# Patient Record
Sex: Male | Born: 1940
Health system: Southern US, Community
[De-identification: ages and names within clinical notes are randomized; demographics above are authoritative.]

## PROBLEM LIST (undated history)

## (undated) DIAGNOSIS — Z789 Other specified health status: Secondary | ICD-10-CM

## (undated) DIAGNOSIS — C4490 Unspecified malignant neoplasm of skin, unspecified: Secondary | ICD-10-CM

## (undated) DIAGNOSIS — R51 Headache: Secondary | ICD-10-CM

## (undated) DIAGNOSIS — K649 Unspecified hemorrhoids: Secondary | ICD-10-CM

## (undated) DIAGNOSIS — E538 Deficiency of other specified B group vitamins: Secondary | ICD-10-CM

## (undated) DIAGNOSIS — G8929 Other chronic pain: Secondary | ICD-10-CM

## (undated) DIAGNOSIS — C61 Malignant neoplasm of prostate: Secondary | ICD-10-CM

## (undated) DIAGNOSIS — J42 Unspecified chronic bronchitis: Secondary | ICD-10-CM

## (undated) DIAGNOSIS — M545 Low back pain, unspecified: Secondary | ICD-10-CM

## (undated) DIAGNOSIS — K5790 Diverticulosis of intestine, part unspecified, without perforation or abscess without bleeding: Secondary | ICD-10-CM

## (undated) DIAGNOSIS — N281 Cyst of kidney, acquired: Secondary | ICD-10-CM

## (undated) DIAGNOSIS — I209 Angina pectoris, unspecified: Secondary | ICD-10-CM

## (undated) DIAGNOSIS — M1990 Unspecified osteoarthritis, unspecified site: Secondary | ICD-10-CM

## (undated) DIAGNOSIS — K222 Esophageal obstruction: Secondary | ICD-10-CM

## (undated) DIAGNOSIS — E785 Hyperlipidemia, unspecified: Secondary | ICD-10-CM

## (undated) DIAGNOSIS — R413 Other amnesia: Secondary | ICD-10-CM

## (undated) DIAGNOSIS — K219 Gastro-esophageal reflux disease without esophagitis: Secondary | ICD-10-CM

## (undated) DIAGNOSIS — D649 Anemia, unspecified: Secondary | ICD-10-CM

## (undated) DIAGNOSIS — R519 Headache, unspecified: Secondary | ICD-10-CM

## (undated) DIAGNOSIS — K859 Acute pancreatitis without necrosis or infection, unspecified: Secondary | ICD-10-CM

## (undated) DIAGNOSIS — R079 Chest pain, unspecified: Secondary | ICD-10-CM

## (undated) DIAGNOSIS — I447 Left bundle-branch block, unspecified: Secondary | ICD-10-CM

## (undated) DIAGNOSIS — I2510 Atherosclerotic heart disease of native coronary artery without angina pectoris: Secondary | ICD-10-CM

## (undated) DIAGNOSIS — K449 Diaphragmatic hernia without obstruction or gangrene: Secondary | ICD-10-CM

## (undated) DIAGNOSIS — I6529 Occlusion and stenosis of unspecified carotid artery: Secondary | ICD-10-CM

## (undated) DIAGNOSIS — I4891 Unspecified atrial fibrillation: Secondary | ICD-10-CM

## (undated) DIAGNOSIS — K635 Polyp of colon: Secondary | ICD-10-CM

## (undated) DIAGNOSIS — I10 Essential (primary) hypertension: Secondary | ICD-10-CM

## (undated) HISTORY — PX: PR VEIN BYPASS GRAFT,AORTO-FEM-POP: 35551

## (undated) HISTORY — DX: Diverticulosis of intestine, part unspecified, without perforation or abscess without bleeding: K57.90

## (undated) HISTORY — DX: Chest pain, unspecified: R07.9

## (undated) HISTORY — DX: Other chronic pain: G89.29

## (undated) HISTORY — DX: Polyp of colon: K63.5

## (undated) HISTORY — PX: CARDIAC CATHETERIZATION: SHX172

## (undated) HISTORY — PX: POLYPECTOMY: SHX149

## (undated) HISTORY — DX: Left bundle-branch block, unspecified: I44.7

## (undated) HISTORY — PX: COLONOSCOPY: SHX174

## (undated) HISTORY — DX: Other specified health status: Z78.9

## (undated) HISTORY — DX: Other amnesia: R41.3

## (undated) HISTORY — PX: CHOLECYSTECTOMY: SHX55

## (undated) HISTORY — DX: Occlusion and stenosis of unspecified carotid artery: I65.29

## (undated) HISTORY — DX: Diaphragmatic hernia without obstruction or gangrene: K44.9

## (undated) HISTORY — PX: PROSTATE CRYOABLATION: SUR358

## (undated) HISTORY — PX: EYE SURGERY: SHX253

## (undated) HISTORY — DX: Unspecified hemorrhoids: K64.9

## (undated) HISTORY — DX: Hyperlipidemia, unspecified: E78.5

## (undated) HISTORY — DX: Deficiency of other specified B group vitamins: E53.8

## (undated) HISTORY — PX: INGUINAL HERNIA REPAIR: SUR1180

## (undated) HISTORY — DX: Gastro-esophageal reflux disease without esophagitis: K21.9

## (undated) HISTORY — DX: Unspecified atrial fibrillation: I48.91

## (undated) HISTORY — PX: CORONARY ANGIOPLASTY WITH STENT PLACEMENT: SHX49

---

## 1998-01-09 ENCOUNTER — Observation Stay (HOSPITAL_COMMUNITY): Admission: EM | Admit: 1998-01-09 | Discharge: 1998-01-10 | Payer: Self-pay | Admitting: Emergency Medicine

## 1999-01-24 HISTORY — PX: CORONARY ARTERY BYPASS GRAFT: SHX141

## 1999-11-20 ENCOUNTER — Encounter: Payer: Self-pay | Admitting: Emergency Medicine

## 1999-11-20 ENCOUNTER — Inpatient Hospital Stay (HOSPITAL_COMMUNITY): Admission: EM | Admit: 1999-11-20 | Discharge: 1999-11-27 | Payer: Self-pay | Admitting: Emergency Medicine

## 1999-11-20 ENCOUNTER — Encounter (INDEPENDENT_AMBULATORY_CARE_PROVIDER_SITE_OTHER): Payer: Self-pay | Admitting: *Deleted

## 1999-11-22 ENCOUNTER — Encounter: Payer: Self-pay | Admitting: Thoracic Surgery (Cardiothoracic Vascular Surgery)

## 1999-11-23 ENCOUNTER — Encounter: Payer: Self-pay | Admitting: Thoracic Surgery (Cardiothoracic Vascular Surgery)

## 1999-11-24 ENCOUNTER — Encounter: Payer: Self-pay | Admitting: Thoracic Surgery (Cardiothoracic Vascular Surgery)

## 1999-11-25 ENCOUNTER — Encounter: Payer: Self-pay | Admitting: Thoracic Surgery (Cardiothoracic Vascular Surgery)

## 2000-06-04 ENCOUNTER — Encounter: Payer: Self-pay | Admitting: Internal Medicine

## 2000-06-07 ENCOUNTER — Ambulatory Visit (HOSPITAL_COMMUNITY): Admission: RE | Admit: 2000-06-07 | Discharge: 2000-06-07 | Payer: Self-pay | Admitting: Internal Medicine

## 2000-06-07 ENCOUNTER — Encounter: Payer: Self-pay | Admitting: Internal Medicine

## 2000-06-07 ENCOUNTER — Encounter (INDEPENDENT_AMBULATORY_CARE_PROVIDER_SITE_OTHER): Payer: Self-pay | Admitting: *Deleted

## 2000-06-14 ENCOUNTER — Ambulatory Visit (HOSPITAL_COMMUNITY): Admission: RE | Admit: 2000-06-14 | Discharge: 2000-06-14 | Payer: Self-pay | Admitting: Internal Medicine

## 2000-06-14 ENCOUNTER — Encounter (INDEPENDENT_AMBULATORY_CARE_PROVIDER_SITE_OTHER): Payer: Self-pay | Admitting: Specialist

## 2000-06-14 ENCOUNTER — Encounter: Payer: Self-pay | Admitting: Internal Medicine

## 2000-06-14 ENCOUNTER — Encounter (INDEPENDENT_AMBULATORY_CARE_PROVIDER_SITE_OTHER): Payer: Self-pay | Admitting: *Deleted

## 2000-06-19 ENCOUNTER — Encounter: Payer: Self-pay | Admitting: General Surgery

## 2000-06-19 ENCOUNTER — Encounter (INDEPENDENT_AMBULATORY_CARE_PROVIDER_SITE_OTHER): Payer: Self-pay | Admitting: *Deleted

## 2000-06-19 ENCOUNTER — Encounter (INDEPENDENT_AMBULATORY_CARE_PROVIDER_SITE_OTHER): Payer: Self-pay | Admitting: Specialist

## 2000-06-20 ENCOUNTER — Inpatient Hospital Stay: Admission: EM | Admit: 2000-06-20 | Discharge: 2000-06-21 | Payer: Self-pay | Admitting: Emergency Medicine

## 2000-06-20 ENCOUNTER — Encounter (INDEPENDENT_AMBULATORY_CARE_PROVIDER_SITE_OTHER): Payer: Self-pay | Admitting: *Deleted

## 2001-03-17 ENCOUNTER — Emergency Department (HOSPITAL_COMMUNITY): Admission: EM | Admit: 2001-03-17 | Discharge: 2001-03-17 | Payer: Self-pay | Admitting: Emergency Medicine

## 2001-03-17 ENCOUNTER — Encounter: Payer: Self-pay | Admitting: Emergency Medicine

## 2001-05-14 ENCOUNTER — Inpatient Hospital Stay (HOSPITAL_COMMUNITY): Admission: EM | Admit: 2001-05-14 | Discharge: 2001-05-15 | Payer: Self-pay | Admitting: Emergency Medicine

## 2001-05-14 ENCOUNTER — Encounter: Payer: Self-pay | Admitting: *Deleted

## 2001-05-15 ENCOUNTER — Encounter: Payer: Self-pay | Admitting: *Deleted

## 2001-11-08 ENCOUNTER — Emergency Department (HOSPITAL_COMMUNITY): Admission: EM | Admit: 2001-11-08 | Discharge: 2001-11-09 | Payer: Self-pay | Admitting: Emergency Medicine

## 2001-11-09 ENCOUNTER — Encounter: Payer: Self-pay | Admitting: Emergency Medicine

## 2002-01-11 ENCOUNTER — Emergency Department (HOSPITAL_COMMUNITY): Admission: EM | Admit: 2002-01-11 | Discharge: 2002-01-11 | Payer: Self-pay

## 2002-09-15 ENCOUNTER — Inpatient Hospital Stay (HOSPITAL_COMMUNITY): Admission: EM | Admit: 2002-09-15 | Discharge: 2002-09-15 | Payer: Self-pay

## 2002-09-15 ENCOUNTER — Encounter: Payer: Self-pay | Admitting: *Deleted

## 2003-06-29 ENCOUNTER — Inpatient Hospital Stay (HOSPITAL_COMMUNITY): Admission: EM | Admit: 2003-06-29 | Discharge: 2003-07-02 | Payer: Self-pay | Admitting: *Deleted

## 2003-10-21 ENCOUNTER — Inpatient Hospital Stay (HOSPITAL_COMMUNITY): Admission: EM | Admit: 2003-10-21 | Discharge: 2003-10-22 | Payer: Self-pay | Admitting: Emergency Medicine

## 2003-10-28 ENCOUNTER — Encounter: Payer: Self-pay | Admitting: Internal Medicine

## 2003-11-12 ENCOUNTER — Encounter: Payer: Self-pay | Admitting: Internal Medicine

## 2004-03-09 ENCOUNTER — Inpatient Hospital Stay (HOSPITAL_COMMUNITY): Admission: EM | Admit: 2004-03-09 | Discharge: 2004-03-10 | Payer: Self-pay | Admitting: Emergency Medicine

## 2004-03-09 ENCOUNTER — Ambulatory Visit: Payer: Self-pay | Admitting: Cardiology

## 2004-03-14 ENCOUNTER — Ambulatory Visit: Payer: Self-pay

## 2004-03-17 ENCOUNTER — Ambulatory Visit: Payer: Self-pay

## 2004-04-10 ENCOUNTER — Emergency Department (HOSPITAL_COMMUNITY): Admission: EM | Admit: 2004-04-10 | Discharge: 2004-04-10 | Payer: Self-pay | Admitting: Emergency Medicine

## 2004-05-18 ENCOUNTER — Ambulatory Visit: Payer: Self-pay | Admitting: Cardiology

## 2004-05-20 ENCOUNTER — Ambulatory Visit: Payer: Self-pay | Admitting: *Deleted

## 2004-05-21 ENCOUNTER — Inpatient Hospital Stay (HOSPITAL_COMMUNITY): Admission: EM | Admit: 2004-05-21 | Discharge: 2004-05-22 | Payer: Self-pay | Admitting: Emergency Medicine

## 2004-06-02 ENCOUNTER — Ambulatory Visit: Payer: Self-pay | Admitting: Cardiology

## 2004-10-28 ENCOUNTER — Ambulatory Visit: Payer: Self-pay | Admitting: Internal Medicine

## 2004-12-09 ENCOUNTER — Ambulatory Visit: Payer: Self-pay | Admitting: Cardiology

## 2004-12-09 ENCOUNTER — Inpatient Hospital Stay (HOSPITAL_COMMUNITY): Admission: EM | Admit: 2004-12-09 | Discharge: 2004-12-13 | Payer: Self-pay | Admitting: Emergency Medicine

## 2005-12-02 ENCOUNTER — Inpatient Hospital Stay (HOSPITAL_COMMUNITY): Admission: EM | Admit: 2005-12-02 | Discharge: 2005-12-03 | Payer: Self-pay | Admitting: Emergency Medicine

## 2005-12-02 ENCOUNTER — Ambulatory Visit: Payer: Self-pay | Admitting: Cardiology

## 2005-12-13 ENCOUNTER — Ambulatory Visit: Payer: Self-pay

## 2006-03-11 ENCOUNTER — Emergency Department (HOSPITAL_COMMUNITY): Admission: EM | Admit: 2006-03-11 | Discharge: 2006-03-12 | Payer: Self-pay | Admitting: Emergency Medicine

## 2006-03-12 ENCOUNTER — Ambulatory Visit: Payer: Self-pay | Admitting: Cardiology

## 2006-03-12 ENCOUNTER — Inpatient Hospital Stay (HOSPITAL_COMMUNITY): Admission: EM | Admit: 2006-03-12 | Discharge: 2006-03-13 | Payer: Self-pay | Admitting: Emergency Medicine

## 2006-03-13 ENCOUNTER — Ambulatory Visit: Payer: Self-pay

## 2006-03-14 ENCOUNTER — Ambulatory Visit: Payer: Self-pay

## 2006-03-20 ENCOUNTER — Ambulatory Visit: Payer: Self-pay | Admitting: Cardiology

## 2006-11-23 ENCOUNTER — Emergency Department (HOSPITAL_COMMUNITY): Admission: EM | Admit: 2006-11-23 | Discharge: 2006-11-23 | Payer: Self-pay | Admitting: Emergency Medicine

## 2007-03-14 ENCOUNTER — Ambulatory Visit: Payer: Self-pay | Admitting: Cardiology

## 2007-04-02 ENCOUNTER — Ambulatory Visit: Payer: Self-pay | Admitting: Cardiology

## 2007-04-02 ENCOUNTER — Ambulatory Visit: Payer: Self-pay

## 2007-04-02 LAB — CONVERTED CEMR LAB
ALT: 18 units/L (ref 0–53)
Albumin: 3.8 g/dL (ref 3.5–5.2)
Alkaline Phosphatase: 51 units/L (ref 39–117)
Bilirubin, Direct: 0.1 mg/dL (ref 0.0–0.3)
Calcium: 9.1 mg/dL (ref 8.4–10.5)
Chloride: 104 meq/L (ref 96–112)
GFR calc non Af Amer: 71 mL/min
Sodium: 141 meq/L (ref 135–145)
Total Bilirubin: 1.5 mg/dL — ABNORMAL HIGH (ref 0.3–1.2)
Total Protein: 6.8 g/dL (ref 6.0–8.3)
Triglycerides: 233 mg/dL (ref 0–149)
VLDL: 47 mg/dL — ABNORMAL HIGH (ref 0–40)

## 2007-12-29 ENCOUNTER — Ambulatory Visit: Payer: Self-pay | Admitting: Internal Medicine

## 2007-12-29 ENCOUNTER — Inpatient Hospital Stay (HOSPITAL_COMMUNITY): Admission: EM | Admit: 2007-12-29 | Discharge: 2008-01-01 | Payer: Self-pay | Admitting: Emergency Medicine

## 2008-01-20 ENCOUNTER — Ambulatory Visit: Payer: Self-pay | Admitting: Cardiology

## 2008-01-20 DIAGNOSIS — K219 Gastro-esophageal reflux disease without esophagitis: Secondary | ICD-10-CM

## 2008-01-20 DIAGNOSIS — I6529 Occlusion and stenosis of unspecified carotid artery: Secondary | ICD-10-CM

## 2008-01-20 DIAGNOSIS — Z951 Presence of aortocoronary bypass graft: Secondary | ICD-10-CM

## 2008-01-20 DIAGNOSIS — R0789 Other chest pain: Secondary | ICD-10-CM

## 2008-01-20 DIAGNOSIS — I447 Left bundle-branch block, unspecified: Secondary | ICD-10-CM

## 2008-01-20 DIAGNOSIS — I10 Essential (primary) hypertension: Secondary | ICD-10-CM | POA: Insufficient documentation

## 2008-03-31 ENCOUNTER — Ambulatory Visit: Payer: Self-pay

## 2008-08-10 ENCOUNTER — Encounter (INDEPENDENT_AMBULATORY_CARE_PROVIDER_SITE_OTHER): Payer: Self-pay | Admitting: *Deleted

## 2008-11-17 ENCOUNTER — Encounter: Payer: Self-pay | Admitting: Cardiology

## 2008-11-18 ENCOUNTER — Ambulatory Visit: Payer: Self-pay | Admitting: Internal Medicine

## 2008-11-18 DIAGNOSIS — Z860100 Personal history of colon polyps, unspecified: Secondary | ICD-10-CM | POA: Insufficient documentation

## 2008-11-18 DIAGNOSIS — R1310 Dysphagia, unspecified: Secondary | ICD-10-CM | POA: Insufficient documentation

## 2008-11-24 ENCOUNTER — Telehealth: Payer: Self-pay | Admitting: Internal Medicine

## 2008-11-24 ENCOUNTER — Encounter: Payer: Self-pay | Admitting: Internal Medicine

## 2008-11-24 ENCOUNTER — Ambulatory Visit: Payer: Self-pay | Admitting: Internal Medicine

## 2008-11-26 ENCOUNTER — Encounter: Payer: Self-pay | Admitting: Internal Medicine

## 2008-12-03 ENCOUNTER — Telehealth (INDEPENDENT_AMBULATORY_CARE_PROVIDER_SITE_OTHER): Payer: Self-pay | Admitting: *Deleted

## 2008-12-09 ENCOUNTER — Encounter (INDEPENDENT_AMBULATORY_CARE_PROVIDER_SITE_OTHER): Payer: Self-pay | Admitting: *Deleted

## 2008-12-22 ENCOUNTER — Ambulatory Visit: Payer: Self-pay | Admitting: Cardiology

## 2008-12-22 ENCOUNTER — Encounter: Payer: Self-pay | Admitting: Nurse Practitioner

## 2008-12-22 DIAGNOSIS — C61 Malignant neoplasm of prostate: Secondary | ICD-10-CM

## 2008-12-23 ENCOUNTER — Telehealth (INDEPENDENT_AMBULATORY_CARE_PROVIDER_SITE_OTHER): Payer: Self-pay

## 2008-12-24 ENCOUNTER — Encounter (HOSPITAL_COMMUNITY): Admission: RE | Admit: 2008-12-24 | Discharge: 2009-01-20 | Payer: Self-pay | Admitting: Cardiology

## 2008-12-24 ENCOUNTER — Telehealth: Payer: Self-pay | Admitting: Cardiology

## 2008-12-24 ENCOUNTER — Ambulatory Visit: Payer: Self-pay

## 2008-12-24 ENCOUNTER — Ambulatory Visit: Payer: Self-pay | Admitting: Cardiology

## 2008-12-24 ENCOUNTER — Encounter: Payer: Self-pay | Admitting: Cardiology

## 2008-12-31 ENCOUNTER — Telehealth (INDEPENDENT_AMBULATORY_CARE_PROVIDER_SITE_OTHER): Payer: Self-pay | Admitting: *Deleted

## 2009-01-01 ENCOUNTER — Observation Stay (HOSPITAL_COMMUNITY): Admission: EM | Admit: 2009-01-01 | Discharge: 2009-01-02 | Payer: Self-pay | Admitting: Urology

## 2009-01-01 ENCOUNTER — Other Ambulatory Visit: Payer: Self-pay | Admitting: Urology

## 2009-01-06 ENCOUNTER — Encounter (INDEPENDENT_AMBULATORY_CARE_PROVIDER_SITE_OTHER): Payer: Self-pay | Admitting: *Deleted

## 2009-02-27 ENCOUNTER — Inpatient Hospital Stay (HOSPITAL_COMMUNITY): Admission: EM | Admit: 2009-02-27 | Discharge: 2009-03-01 | Payer: Self-pay | Admitting: Emergency Medicine

## 2009-02-27 ENCOUNTER — Ambulatory Visit: Payer: Self-pay | Admitting: Internal Medicine

## 2009-05-05 ENCOUNTER — Encounter (INDEPENDENT_AMBULATORY_CARE_PROVIDER_SITE_OTHER): Payer: Self-pay | Admitting: *Deleted

## 2009-07-20 ENCOUNTER — Ambulatory Visit (HOSPITAL_COMMUNITY): Admission: RE | Admit: 2009-07-20 | Discharge: 2009-07-20 | Payer: Self-pay | Admitting: Urology

## 2009-08-29 ENCOUNTER — Emergency Department (HOSPITAL_COMMUNITY): Admission: EM | Admit: 2009-08-29 | Discharge: 2009-08-30 | Payer: Self-pay | Admitting: Emergency Medicine

## 2009-09-23 ENCOUNTER — Encounter: Payer: Self-pay | Admitting: Cardiology

## 2009-09-23 ENCOUNTER — Encounter: Payer: Self-pay | Admitting: Internal Medicine

## 2009-12-23 DIAGNOSIS — K859 Acute pancreatitis without necrosis or infection, unspecified: Secondary | ICD-10-CM

## 2009-12-23 HISTORY — DX: Acute pancreatitis without necrosis or infection, unspecified: K85.90

## 2010-01-04 ENCOUNTER — Emergency Department (HOSPITAL_COMMUNITY)
Admission: EM | Admit: 2010-01-04 | Discharge: 2010-01-04 | Payer: Self-pay | Source: Home / Self Care | Admitting: Emergency Medicine

## 2010-01-07 ENCOUNTER — Inpatient Hospital Stay (HOSPITAL_COMMUNITY)
Admission: EM | Admit: 2010-01-07 | Discharge: 2010-01-15 | Payer: Self-pay | Source: Home / Self Care | Attending: Cardiology | Admitting: Cardiology

## 2010-01-15 ENCOUNTER — Encounter: Payer: Self-pay | Admitting: Cardiovascular Disease

## 2010-01-15 ENCOUNTER — Encounter: Payer: Self-pay | Admitting: Gastroenterology

## 2010-01-18 ENCOUNTER — Encounter (INDEPENDENT_AMBULATORY_CARE_PROVIDER_SITE_OTHER): Payer: Self-pay | Admitting: *Deleted

## 2010-01-28 ENCOUNTER — Encounter: Payer: Self-pay | Admitting: Physician Assistant

## 2010-01-31 ENCOUNTER — Encounter: Payer: Self-pay | Admitting: Physician Assistant

## 2010-01-31 ENCOUNTER — Other Ambulatory Visit: Payer: Self-pay | Admitting: Physician Assistant

## 2010-01-31 ENCOUNTER — Telehealth: Payer: Self-pay | Admitting: Cardiology

## 2010-01-31 ENCOUNTER — Ambulatory Visit
Admission: RE | Admit: 2010-01-31 | Discharge: 2010-01-31 | Payer: Self-pay | Source: Home / Self Care | Attending: Cardiology | Admitting: Cardiology

## 2010-01-31 ENCOUNTER — Telehealth: Payer: Self-pay | Admitting: Internal Medicine

## 2010-01-31 DIAGNOSIS — K859 Acute pancreatitis without necrosis or infection, unspecified: Secondary | ICD-10-CM | POA: Insufficient documentation

## 2010-01-31 LAB — CBC WITH DIFFERENTIAL/PLATELET
Basophils Absolute: 0 10*3/uL (ref 0.0–0.1)
Basophils Relative: 0.6 % (ref 0.0–3.0)
Eosinophils Absolute: 0.1 10*3/uL (ref 0.0–0.7)
Eosinophils Relative: 1.7 % (ref 0.0–5.0)
HCT: 36.6 % — ABNORMAL LOW (ref 39.0–52.0)
Hemoglobin: 12.4 g/dL — ABNORMAL LOW (ref 13.0–17.0)
Lymphocytes Relative: 29.8 % (ref 12.0–46.0)
Lymphs Abs: 1.8 10*3/uL (ref 0.7–4.0)
MCHC: 33.8 g/dL (ref 30.0–36.0)
MCV: 91.8 fl (ref 78.0–100.0)
Monocytes Absolute: 0.5 10*3/uL (ref 0.1–1.0)
Monocytes Relative: 8.2 % (ref 3.0–12.0)
Neutro Abs: 3.5 10*3/uL (ref 1.4–7.7)
Neutrophils Relative %: 59.7 % (ref 43.0–77.0)
Platelets: 206 10*3/uL (ref 150.0–400.0)
RBC: 3.99 Mil/uL — ABNORMAL LOW (ref 4.22–5.81)
RDW: 13.4 % (ref 11.5–14.6)
WBC: 5.9 10*3/uL (ref 4.5–10.5)

## 2010-01-31 LAB — HEPATIC FUNCTION PANEL
ALT: 15 U/L (ref 0–53)
AST: 14 U/L (ref 0–37)
Albumin: 3.4 g/dL — ABNORMAL LOW (ref 3.5–5.2)
Alkaline Phosphatase: 53 U/L (ref 39–117)
Bilirubin, Direct: 0.2 mg/dL (ref 0.0–0.3)
Total Bilirubin: 1.2 mg/dL (ref 0.3–1.2)
Total Protein: 6.1 g/dL (ref 6.0–8.3)

## 2010-01-31 LAB — LIPASE: Lipase: 47 U/L (ref 11.0–59.0)

## 2010-02-01 ENCOUNTER — Ambulatory Visit
Admission: RE | Admit: 2010-02-01 | Discharge: 2010-02-01 | Payer: Self-pay | Source: Home / Self Care | Attending: Gastroenterology | Admitting: Gastroenterology

## 2010-02-01 ENCOUNTER — Telehealth (INDEPENDENT_AMBULATORY_CARE_PROVIDER_SITE_OTHER): Payer: Self-pay | Admitting: *Deleted

## 2010-02-01 ENCOUNTER — Telehealth: Payer: Self-pay | Admitting: Cardiology

## 2010-02-01 DIAGNOSIS — R110 Nausea: Secondary | ICD-10-CM | POA: Insufficient documentation

## 2010-02-01 DIAGNOSIS — R1013 Epigastric pain: Secondary | ICD-10-CM | POA: Insufficient documentation

## 2010-02-01 DIAGNOSIS — F411 Generalized anxiety disorder: Secondary | ICD-10-CM | POA: Insufficient documentation

## 2010-02-01 DIAGNOSIS — Z8719 Personal history of other diseases of the digestive system: Secondary | ICD-10-CM | POA: Insufficient documentation

## 2010-02-09 ENCOUNTER — Encounter: Payer: Self-pay | Admitting: Cardiology

## 2010-02-09 ENCOUNTER — Ambulatory Visit: Admission: RE | Admit: 2010-02-09 | Discharge: 2010-02-09 | Payer: Self-pay | Source: Home / Self Care

## 2010-02-22 NOTE — Letter (Signed)
Summary: [facility] Office Visit Note   [facility] Office Visit Note   Imported By: Roderic Ovens 10/12/2009 15:51:41  _____________________________________________________________________  External Attachment:    Type:   Image     Comment:   External Document

## 2010-02-22 NOTE — Letter (Signed)
Summary: [facility]  [facility]   Imported By: Lennie Odor 10/01/2009 17:14:47  _____________________________________________________________________  External Attachment:    Type:   Image     Comment:   External Document

## 2010-02-22 NOTE — Miscellaneous (Signed)
  Clinical Lists Changes PT GIVEN AMLODIPINE AT DISCHARGE FROM HOSP Deliah Goody, RN  May 05, 2009 3:02 PM  Medications: Added new medication of AMLODIPINE BESYLATE 5 MG TABS (AMLODIPINE BESYLATE) Take one tablet by mouth daily - Signed Rx of AMLODIPINE BESYLATE 5 MG TABS (AMLODIPINE BESYLATE) Take one tablet by mouth daily;  #30 x 12;  Signed;  Entered by: Deliah Goody, RN;  Authorized by: Ferman Hamming, MD, [facility];  Method used: Electronically to [facility]  [address] [phone]*, [address] [place], [place], [place]  [address], Ph: [phone] or [phone], Fax: [phone]    Prescriptions: AMLODIPINE BESYLATE 5 MG TABS (AMLODIPINE BESYLATE) Take one tablet by mouth daily  #30 x 12   Entered by:   Deliah Goody, RN   Authorized by:   Ferman Hamming, MD, [facility]   Signed by:   Deliah Goody, RN on 05/05/2009   Method used:   Electronically to        [facility]  [address] [phone]* (retail)       [address]       [place], [place]  [address]       Ph: [phone] or [phone]       Fax: [phone]   RxID:   [id number]

## 2010-02-24 NOTE — Progress Notes (Signed)
Summary: pt rtn call from yesterday  Phone Note Call from Patient Call back at Home Phone [phone]   Caller: Spouse Reason for Call: Talk to Nurse, Talk to Doctor Summary of Call: pt rtn call from yesterday from the triage nurse Initial call taken by: Omer Jack,  February 01, 2010 8:07 AM  Follow-up for Phone Call        left msg to adv pt of results of lab.  Follow-up by: Claris Gladden RN,  February 01, 2010 9:25 AM

## 2010-02-24 NOTE — Progress Notes (Signed)
Summary: GI appointment/returning your call  Phone Note Outgoing Call Call back at Home Phone [phone]   Summary of Call: Called pt to give him appt time with GI tomorrow. Pt is scheduled to see Lucrezia Europe, NP on February 01, 2010 at 10:15.  Left message to call back.   Initial call taken by: Dossie Arbour, RN, BSN,  January 31, 2010 11:46 AM  Follow-up for Phone Call        pt returning your call Follow-up by: Roe Coombs,  January 31, 2010 12:43 PM  Additional Follow-up for Phone Call Additional follow up Details #1::        Wife given appt. time. She states husband will be at the appt. Additional Follow-up by: Dossie Arbour, RN, BSN,  January 31, 2010 1:33 PM

## 2010-02-24 NOTE — Miscellaneous (Signed)
Summary: med update  Clinical Lists Changes  Medications: Changed medication from ISOSORBIDE MONONITRATE CR 30 MG XR24H-TAB (ISOSORBIDE MONONITRATE) Take one tablet by mouth daily to ISOSORBIDE MONONITRATE CR 60 MG XR24H-TAB (ISOSORBIDE MONONITRATE) Take  1/2 tablet by mout two times a day

## 2010-02-24 NOTE — Progress Notes (Signed)
Summary: Needs hosp fu appt  Phone Note From Other Clinic   Caller: Tyson Babinski [facility] [phone]  Call For: Dr Marina Goodell Reason for Call: Schedule Patient Appt Summary of Call: Patient needs hsopital follow up appt this week with Dr Marina Goodell or Mike Gip Initial call taken by: Leanor Kail [facility],  January 31, 2010 10:13 AM  Follow-up for Phone Call        Given appt.with Gunnar Fusi for tomorrow.Cardiology will contact pt. Follow-up by: Teryl Lucy RN,  January 31, 2010 11:14 AM

## 2010-02-24 NOTE — Letter (Signed)
Summary: [facility] Procedure Report  [facility] Procedure Report   Imported By: Marylou Mccoy 02/03/2010 09:19:10  _____________________________________________________________________  External Attachment:    Type:   Image     Comment:   External Document

## 2010-02-24 NOTE — Miscellaneous (Signed)
Clinical Lists Changes  Observations: Added new observation of CARDCATHFIND: 1. Coronary artery disease, status post prior coronary bypass graft     surgery. 2. Severe native vessel disease with 0% stenosis at the stent site in     the left main and proximal circumflex artery, total occlusion of     the left anterior descending artery, 90% stenosis at a bifurcation     of the AV circumflex and marginal branch, 0% stenosis at the ostium     of the stent in the right coronary artery, and 40% narrowing in the     mid-right coronary artery. 3. Patent radial graft to the diagonal branch of the LAD with     occlusion of its distal limb to the circumflex marginal vessel, and     patent LIMA graft to the LAD. 4. Normal LV function.   RECOMMENDATIONS:  I think the culprit for the patient's unstable angina are the tight lesions in the marginal and AV circumflex artery, which is a bifurcation lesion.  This is a very difficult lesion for intervention because of a sharp bend from the left main into the vessel and because of stents in this vessel making this not compliant.  I think it is feasible to do, and I think we will plan to try and attempt to open this vessel tomorrow.     (01/12/2010 14:16) Added new observation of NUCLEAR NOS:  Findings: Utilizing gated data, the end-diastolic volume is   estimated to be 65 ml and the end-systolic volume 24 ml.   Calculated ejection fraction is 64%.  There is evidence of septal   hypokinesis which may relate to prior CABG.    SPECT imaging shows no evidence of inducible ischemia.  Mildly   decreased septal and anteroseptal perfusion on both stress and rest   studies may be consistent with a component of scar.    IMPRESSION:   No evidence of myocardial ischemia.  Calculated ejection fraction   of 64%.  Septal hypokinesis and suggestion of potentially component   of the septal/anteroseptal scar.  Some of the septal wall motion   abnormality is likely  secondary to prior CABG. (01/08/2010 14:17)      Cardiac Cath  Procedure date:  01/12/2010  Findings:      1. Coronary artery disease, status post prior coronary bypass graft     surgery. 2. Severe native vessel disease with 0% stenosis at the stent site in     the left main and proximal circumflex artery, total occlusion of     the left anterior descending artery, 90% stenosis at a bifurcation     of the AV circumflex and marginal branch, 0% stenosis at the ostium     of the stent in the right coronary artery, and 40% narrowing in the     mid-right coronary artery. 3. Patent radial graft to the diagonal branch of the LAD with     occlusion of its distal limb to the circumflex marginal vessel, and     patent LIMA graft to the LAD. 4. Normal LV function.   RECOMMENDATIONS:  I think the culprit for the patient's unstable angina are the tight lesions in the marginal and AV circumflex artery, which is a bifurcation lesion.  This is a very difficult lesion for intervention because of a sharp bend from the left main into the vessel and because of stents in this vessel making this not compliant.  I think it is feasible to do,  and I think we will plan to try and attempt to open this vessel tomorrow.      Nuclear Study  Procedure date:  01/08/2010  Findings:       Findings: Utilizing gated data, the end-diastolic volume is   estimated to be 65 ml and the end-systolic volume 24 ml.   Calculated ejection fraction is 64%.  There is evidence of septal   hypokinesis which may relate to prior CABG.    SPECT imaging shows no evidence of inducible ischemia.  Mildly   decreased septal and anteroseptal perfusion on both stress and rest   studies may be consistent with a component of scar.    IMPRESSION:   No evidence of myocardial ischemia.  Calculated ejection fraction   of 64%.  Septal hypokinesis and suggestion of potentially component   of the septal/anteroseptal scar.  Some of  the septal wall motion   abnormality is likely secondary to prior CABG.

## 2010-02-24 NOTE — Assessment & Plan Note (Signed)
Summary: [name] [name]   Referring Provider:  n/a Primary Provider:  Burnell Blanks, MD   CC:  follow up [name]..pt complains of sob.Marland Kitchenalso pt complains of nausea.  History of Present Illness: Primary Cardiologist:  Dr. Olga [name]  [name] [name] is a 70 yo male with a h/o CAD, status post CABG in 2001, status post DES to the left main in the past as well as stenting to the CFX and RCA.  He was recently admitted with unstable angina from December 16 to December 24.  MI was ruled out.  Stress test was nonischemic.  But with continued chest pain, cardiac [name] was pursued.  [name] demonstrated a patent radial graft to the DX with total occlusion of the distal limb to the circumflex and a patent L-LAD.  A stent to the left main was patent as was the stent in the circumflex and RCA.  He did have high-grade bifurcational disease of the AV circumflex/obtuse marginal.  This was treated with a Promus DES to the CFX.  The patient continued to have abdominal pain postintervention and was seen by gastroenterology.  He was diagnosed with acute pancreatitis.  Etiology was uncertain.  He did have his PPI discontinued and he was placed on an H2 receptor antagonist.  ERCP was unremarkable.  He is to have followup with gastroenterology as an outpatient.  The patient continues to have significant symptoms of abdominal pain and nausea.  He was unable to go without his protonix and restarted this medication last week.  He denies dysphagia.  He has had a long history of violent shaking when he gets nauseated.  This has apparently been worked up in the past by his primary care physician.  He has chronic chest pain.  This is unchanged.  However, after further questioning, the patient does note that he had some chest discomfort and shortness of breath that was worse with exertion prior to his intervention.  This is resolved.  He denies syncope.  He denies orthopnea or PND.  He denies pedal edema.  Current Medications  (verified): 1)  Metoprolol Tartrate 50 Mg Tabs (Metoprolol Tartrate) .Marland Kitchen.. 1 1/2 Tab By Mouth Two Times A Day 2)  Enalapril Maleate 20 Mg Tabs (Enalapril Maleate) .Marland Kitchen.. 1 Tablet By Mouth Two Times A Day 3)  Benadryl 25 Mg Caps (Diphenhydramine Hcl) .... Take As Needed 4)  Aspirin 81 Mg Tbec (Aspirin) .Marland Kitchen.. 1 Tablet By Mouth Once Daily 5)  Fish Oil 1000 Mg Caps (Omega-3 Fatty Acids) .Marland Kitchen.. 1 Tablet By Mouth Two Times A Day 6)  Metoclopramide Hcl 10 Mg Tabs (Metoclopramide Hcl) .... Take 1 As Needed 7)  Protonix 40 Mg Tbec (Pantoprazole Sodium) .... Take 1 By Mouth Two Times A Day 8)  Isosorbide Mononitrate Cr 60 Mg Xr24h-Tab (Isosorbide Mononitrate) .... Take  1/2 Tablet By [employer] Two Times A Day 9)  Amlodipine Besylate 5 Mg Tabs (Amlodipine Besylate) .... 1/2 Tab By Mouth Two Times A Day 10)  Plavix 75 Mg Tabs (Clopidogrel Bisulfate) .... Take One Tablet By Mouth Daily  Allergies: 1)  ! Codeine 2)  ! Oxycodone Hcl 3)  ! Augmentin 4)  ! Morphine 5)  ! Ambien (Zolpidem Tartrate) 6)  ! * Shellfish  Past History:  Past Medical History: CAD      a. s/p cabg      b.  Low risk Myoview 3/09      c. s/p stent to LM, CFX and RCA in past      d. [name]  12/2009: radial to Dx graft ok with occl. of distal limb to CFX; L-LAD ok; stent to LM, CFX and RCA ok; 90% stenosis at bifurcation of AVCFX/OM . Marland Kitchen . treated with DES (Promus); preserved LVF Chronic chest pain LBBB Cerebrovascular Disease      a. carotid u/s 3/09: RICA- 0-39%, LICA- 40-59% G E R D Hyperlipidemia Hypertension Inguinal hernias Statin intolerance Anxiety Disorder Angina Prostate Cancer 11/16/2008 Pancreatitis 12/2009 (ERCP ok)  Review of Systems       As per  the HPI.  All other systems reviewed and negative.   Vital Signs:  Patient profile:   70 year old male Height:      64 inches Weight:      142 pounds BMI:     24.46 Pulse rate:   63 / minute Resp:     16 per minute BP sitting:   102 / 64  (left arm)  Vitals  Entered By: [name] [name] (January 31, 2010 9:24 AM)  Physical Exam  General:  Well nourished, well developed, in no acute distress HEENT: normal Neck: no JVD Cardiac:  normal S1, S2; RRR; no murmur Lungs:  clear to auscultation bilaterally, no wheezing, rhonchi or rales Abd: soft, nontender, no hepatomegaly Ext: no edema; RFA site without hematoma or bruit Vascular: + bilat carotid  bruits Skin: warm and dry Neuro:  CNs 2-12 intact, no focal abnormalities noted    EKG  Procedure date:  01/31/2010  Findings:      sinus brady HR 58 LBBB   Impression & Recommendations:  Problem # 1:  CAD, ARTERY BYPASS GRAFT (ICD-414.04) He has chronic chest pain.  His angina prior to his PCI was chest pain and dyspnea with exertion (i.e. with going to the mailbox).  This has resolved.  I have explained the importance of taking Plavix and ASA.  He is unable to take any statins due to side effects.  I believe his current chest symptoms are related to his GI problems.  He needs follow up with GI.  He will also be brought back in follow up with [name] [name] in the next 4 weeks.  Problem # 2:  PANCREATITIS (ICD-577.0) He needs follow up with GI.  He was unable to remain on zantac due to problems with his GERD.  He is now back on Protonix.  As noted, his ERCP was ok in the hospital.  We will make sure he has close f/u with GI.  Will check LFTs and Lipase today to follow up.  Orders: TLB-Hepatic/Liver Function Pnl (80076-HEPATIC) TLB-Lipase (83690-LIPASE)  Problem # 3:  HYPERCHOLESTEROLEMIA, PURE (ICD-272.0) Intol to statins.  Problem # 4:  HYPERTENSION, BENIGN (ICD-401.1) Controlled.  Orders: TLB-CBC Platelet - w/Differential (85025-CBCD)  Problem # 5:  GERD (ICD-530.81) As above, we will set up for close f/u with GI.  Problem # 6:  CAROTID ARTERY STENOSIS, WITHOUT INFARCTION (ICD-433.10) He needs f/u dopplers.  These will be arranged.  Orders: Carotid Duplex (Carotid  Duplex)  Problem # 7:  Thrombocytopenia Platelet count was low in the hospital x 1 (133K).  Will repeat today.  Patient Instructions: 1)  Your physician recommends that you schedule a follow-up appointment in: 4 weeks with [name] [name] 2)  Your physician recommends that you continue on your current medications as directed. Please refer to the Current Medication list given to you today. 3)  We have called Dr. Lamar Sprinkles office (gastroenterology) to make appt. for you to be seen this week or early next  week.  They will call you with appt time. Their office number is [phone]. 4)  Your physician has requested that you have a carotid duplex. This test is an ultrasound of the carotid arteries in your neck. It looks at blood flow through these arteries that supply the brain with blood. Allow one hour for this exam. There are no restrictions or special instructions.

## 2010-02-24 NOTE — Assessment & Plan Note (Signed)
Summary: Post hosp. ERCP f/u-done by Dr.kaplan   History of Present Illness Visit Type: Follow-up Visit Primary GI MD: Yancey Flemings MD Primary Provider: Burnell Blanks, MD  Requesting Provider: Dr. Nathanial Rancher Chief Complaint: Post ER and ERCP, Pt sick on stomach and having chest pain History of Present Illness:   Patient is known to Dr. Marina Goodell for chronic chest pain, upper abdominal pain, GERD complicated by peptic stricture, and colon polyps. He was last seen in the office at time of his last colonoscopy November 2010.   Patient was evaluated in ER 01/04/10 for RLQ pain and nausea. CBC,CMET and lipase were normal. CTscan without IV contrast was negative except for stable hepatic cysts and slightly enlarged renal cysts. Patient releaased from ER. He went back to ER a few days later with chest pain. Patient was admitted and underwent cardiac catherization with PCI to the circumflex artery. During that admission patient developed acute on chronic abdominal pain in setting of a Lipase of 360. U/S was negative, LFTs normal. He underwent ERCP which was normal.  Cause of panreatitis was not determined. His PPI was discontinued in hopes thatt it was the culprit. Patient followed up with cardiologist two days ago. Because of his complaints of ongoing abdominal pain and nausea, patient was asked to follow up with [place].   He  does have epigastric pain but mainly complains of postprandial chest pain, bloating and nausea.  After PPI was discontinued patient was started on Zantac .He feels Protonix definately worked better.  Patient's atypical chest pain and nausea have been present for years. Episodes of nausea are often associated with severe whole body shaking. His wife believes her husband suffers from panic attacks.    He complains of dizziness with standing, states this is a chronic problem as well. Diagnosed with vertigo at some point. No SOB. No syncope episodes. Marland Kitchen    GI Review of Systems     Location of   Abdominal pain: epigastric area.    Denies abdominal pain, acid reflux, belching, bloating, chest pain, dysphagia with liquids, dysphagia with solids, heartburn, loss of appetite, nausea, vomiting, vomiting blood, weight loss, and  weight gain.        Denies anal fissure, black tarry stools, change in bowel habit, constipation, diarrhea, diverticulosis, fecal incontinence, heme positive stool, hemorrhoids, irritable bowel syndrome, jaundice, light color stool, liver problems, rectal bleeding, and  rectal pain.    Current Medications (verified): 1)  Metoprolol Tartrate 50 Mg Tabs (Metoprolol Tartrate) .Marland Kitchen.. 1 1/2 Tab By Mouth Two Times A Day 2)  Enalapril Maleate 20 Mg Tabs (Enalapril Maleate) .Marland Kitchen.. 1 Tablet By Mouth Two Times A Day 3)  Benadryl 25 Mg Caps (Diphenhydramine Hcl) .... Take As Needed 4)  Aspirin 81 Mg Tbec (Aspirin) .Marland Kitchen.. 1 Tablet By Mouth Once Daily 5)  Fish Oil 1000 Mg Caps (Omega-3 Fatty Acids) .Marland Kitchen.. 1 Tablet By Mouth Two Times A Day 6)  Isosorbide Mononitrate Cr 60 Mg Xr24h-Tab (Isosorbide Mononitrate) .... Take  1/2 Tablet By [employer] Two Times A Day 7)  Amlodipine Besylate 5 Mg Tabs (Amlodipine Besylate) .... 1/2 Tab By Mouth Two Times A Day 8)  Plavix 75 Mg Tabs (Clopidogrel Bisulfate) .... Take One Tablet By Mouth Daily 9)  Carafate 1 Gm Tabs (Sucralfate) .... Take 1 Tab Before Meals and At Bedtime 10)  Ondansetron Hcl 4 Mg Tabs (Ondansetron Hcl) .... Take 1 Tab Every 6 Hours As Needed For Nausea 11)  Lorazepam 0.5 Mg Tabs (Lorazepam) .... Take 1/2 Tab  At Bedtime  Allergies: 1)  ! Codeine 2)  ! Oxycodone Hcl 3)  ! Augmentin 4)  ! Morphine 5)  ! Ambien (Zolpidem Tartrate) 6)  ! * Nitrofur 7)  ! * Tramadol 8)  ! * Shellfish  Past History:  Past Medical History: Last updated: 01/31/2010 CAD      a. s/p cabg      b.  Low risk Myoview 3/09      c. s/p stent to LM, CFX and RCA in past      d. cath 12/2009: radial to Dx graft ok with occl. of distal limb to CFX; L-LAD ok;  stent to LM, CFX and RCA ok; 90% stenosis at bifurcation of AVCFX/OM . Marland Kitchen . treated with DES (Promus); preserved LVF Chronic chest pain LBBB Cerebrovascular Disease      a. carotid u/s 3/09: RICA- 0-39%, LICA- 40-59% G E R D Hyperlipidemia Hypertension Inguinal hernias Statin intolerance Anxiety Disorder Angina Prostate Cancer 11/16/2008 Pancreatitis 12/2009 (ERCP ok)  Past Surgical History: Last updated: 01/20/2008 CABG (10-26-99; LIMA to LAD; radial artery to D1 and OM1) Previous LM and RCA stent cholecystectomy  Family History: Last updated: 11/18/2008 Mother and Four siblings with premature CAD Father died of lung cancer No FH of Colon Cancer:  Social History: Last updated: 11/18/2008 Married  Alcohol Use - no Occupation: Retired  Daily Caffeine Use: one daily  Illicit Drug Use - no Smokeless Tobacco Use  Patient has never smoked.   Review of Systems       The patient complains of fatigue, shortness of breath, sleeping problems, and sore throat.  The patient denies allergy/sinus, anemia, anxiety-new, arthritis/joint pain, back pain, blood in urine, breast changes/lumps, change in vision, confusion, cough, coughing up blood, depression-new, fainting, fever, headaches-new, hearing problems, heart murmur, heart rhythm changes, itching, muscle pains/cramps, night sweats, nosebleeds, skin rash, swelling of feet/legs, swollen lymph glands, thirst - excessive, urination - excessive, urination changes/pain, urine leakage, vision changes, and voice change.    Vital Signs:  Patient profile:   70 year old male Height:      64 inches Weight:      140 pounds BMI:     24.12 BSA:     1.68 Pulse rate:   54 / minute BP sitting:   112 / 72  (left arm)  Vitals Entered By: Merri Ray CMA (AAMA) (February 01, 2010 10:02 AM)  Physical Exam  General:  Well developed, well nourished, no acute distress. Head:  Normocephalic and atraumatic. Eyes:  Conjunctiva pink, no  icterus.  Neck:  no obvious masses  Lungs:  Clear throughout to auscultation. Heart:  RRR Abdomen:  Abdomen soft, nontender, nondistended. No obvious masses or hepatomegaly.Normal bowel sounds.  Msk:  Symmetrical with no gross deformities. Normal posture. Extremities:  No palmar erythema, no edema.  Neurologic:  Alert and  oriented x4;  grossly normal neurologically. Skin:  Intact without significant lesions or rashes. Cervical Nodes:  No significant cervical adenopathy. Psych:  Alert and cooperative. Normal mood and affect.   Impression & Recommendations:  Problem # 1:  CHEST PAIN, NON-CARDIAC (ICD-786.59) Assessment Unchanged Several year history of atypical chest pain for which he has had at least 3 upper endoscopies (2002, 2005, and 2011) with findings of only a small hiatal hernia, duodenal stricture, and GERD related  esophageal stricture. He has CAD and had a cardiac stent placed less than a month ago. . Patient as been off PPIs for the last few weeks (see #2), Zantac  hasn't worked as well. Will try Carafate but restarting PPI may be inevitable.   Problem # 2:  Hx of PANCREATITIS, ACUTE, HX OF (ICD-V12.70) Assessment: New Acute pancreatitis as evidenced by lipase of 360 during his December hospitalization. Etiology unknown, he had an ERCP which was normal. Patient's PPI was discontinued with the hope that it may have been the culprit. Patient continues to complain of epigastric pain, it is same pain as he has had for years.CMET, CBC, and lipase two days ago were normal. Based on office records patient's weight has been the same since at least Oct. 2010. See #3. Of note, patient on an ACE inhibitor which has been known to cause pancreatitis.  Problem # 3:  ABDOMINAL PAIN-EPIGASTRIC (ICD-789.06) Assessment: Deteriorated Chronic, intermittent. Patient has been off PPI for a few weeks. Will try Carafate but we may eventually need to restart his PPI.  Patient will follow up with Dr.  Marina Goodell in a few weeks.   Problem # 4:  NAUSEA (ICD-787.02) Assessment: Unchanged Several year history of nausea associated with severe shaking spells. Patient's wife accompanies him and she feels symptoms are related to anxiety / panic attacks. During these episodes patient takes a half of one of his wife's "nerve pills" and feels better. I have advised patient to discuss his anxiety problems with his PCP. I will give patient  a few Lorazepam to take until he can get in to see his PCP. He can also use Zofran as needed.   Patient Instructions: 1)  We sent perscriptions for Carafate and Zofran for nausea to [facility] [place].  2)  We made you a follow up appointment  with Dr. Marina Goodell on 03-21-2010. 3)  We also gave you brochures on Acid Reflux.  4)  cc: Dr. Malissa Hippo  ([place]).  5)  The medication list was reviewed and reconciled.  All changed / newly prescribed medications were explained.  A complete medication list was provided to the patient / caregiver. Prescriptions: LORAZEPAM 0.5 MG TABS (LORAZEPAM) Take 1/2 tab at bedtime  #15 x 0   Entered by:   Lowry Ram NCMA   Authorized by:   Willette Cluster NP   Signed by:   Lowry Ram NCMA on 02/01/2010   Method used:   Printed then faxed to ...       [facility] [address] [phone][facility] (retail)       [address]       [place], [place]  [address]       Ph: [phone] or [phone]       Fax: [phone]   RxID:   [id number] ONDANSETRON HCL 4 MG TABS (ONDANSETRON HCL) Take 1 tab every 6 hours as needed for nausea  #40 x 0   Entered by:   Lowry Ram NCMA   Authorized by:   Willette Cluster NP   Signed by:   Lowry Ram NCMA on 02/01/2010   Method used:   [facility]ronically to        [facility]  [address] [address] [phone]* (retail)       [address]       [place], [place]  [address]       Ph: [phone] or [phone]       Fax: [phone]   RxID:   [id number] CARAFATE 1 GM TABS (SUCRALFATE) Take 1 tab before meals and at  bedtime  #120 x  0   Entered by:   Lowry Ram NCMA   Authorized by:   Willette Cluster NP   Signed by:   Lowry Ram NCMA on 02/01/2010   Method used:   [facility]ronically [facility]        [facility]  [address] [phone][facility] (retail)       [address]       [place], [place]  [address]       Ph: [phone] or [phone]       Fax: [phone]   RxID:   [id number]   Appended Document: Post hosp. ERCP f/u-done by Dr.kaplan needs ultrasound....  Appended Document: Post hosp. ERCP f/u-done by Dr.kaplan Left a message on patients machine to call back.   Appended Document: Post hosp. ERCP f/u-done by Dr.kaplan Does he need another U/S?  He had one in hospital 01/14/10. It was normal except for ?? prominent pancreatic duct. ERCP was normal with normal pancreatic duct.  Appended Document: Post hosp. ERCP f/u-done by Dr.kaplan Talked with Willette Cluster and she will discuss the need to a follow up ultrasound with Dr. Marina Goodell.

## 2010-02-24 NOTE — Procedures (Signed)
Summary: ERCP  Patient: [name] [name] Note: All result statuses are Final unless otherwise noted.  Tests: (1) ERCP (ERC)   ERC ERCP                  DONE (C)     [place] [facility]     [address]     [place], [place]  [address]           ERCP PROCEDURE REPORT           PATIENT:  [name] [name], [name] [name]  MR#:  [id number]     BIRTHDATE:  [birth year]  GENDER:  male           ENDOSCOPIST:  Barbette Hair. Arlyce Dice, MD     ASSISTANT:           PROCEDURE DATE:  01/15/2010     PROCEDURE:  ERCP           INDICATIONS:  acute pancreatitis           MEDICATIONS:   Fentanyl 150 mcg IV, Versed 15 mg IV, Benadryl     18.75 mg IV, glucagon 2 mg IV, Zofran 4 mg IV, cipro 400mg  IV     TOPICAL ANESTHETIC:  Cetacaine Spray           DESCRIPTION OF PROCEDURE:   After the risks benefits and     alternatives of the procedure were thoroughly explained, informed     consent was obtained.  The [id number] ([id number]) endoscope was     introduced through the mouth and advanced to the third portion of     the duodenum.           The pancreatic duct was filled to the tail and appeared to be     normal. Care was taken not to overfill the ductal system.     Pancreatic duct was minimally injected, normal to the proximal     body  Cannulation of the common bile duct was accomplished. The     common bile duct and intrahepatics were normal without filling     defects, strictures, or stones. After placing a 0.52mm guidewire     into the pancreatic duct to facilitate cannulation of the CBD a     0.60mm wire was directly inserted into the CBD. Injection     demonstrated a normal biliary tree and CBD.    The scope was then     completely withdrawn from the patient and the procedure     terminated.           COMPLICATIONS:  None           ENDOSCOPIC IMPRESSION:     1) Normal pancreatic duct     2) Normal CBD           Etiology for pancreatitis still uncertain.  No evidence for     biliary tract disease             RECOMMENDATIONS:hold PPI therapy           ______________________________     Barbette Hair. Arlyce Dice, MD           CC:  Madolyn Frieze. Jens Som, M.D.           n.     REVISED:  01/15/2010 09:36 AM     eSIGNED:   Barbette Hair. Kaplan at 01/15/2010 09:36 AM           Tyrell Antonio, [phone]  Note: An  exclamation mark (!) indicates a result that was not dispersed into the flowsheet. Document Creation Date: 01/15/2010 9:36 AM _______________________________________________________________________  (1) Order result status: Final Collection or observation date-time: 01/15/2010 09:30 Requested date-time:  Receipt date-time:  Reported date-time:  Referring Physician:   Ordering Physician: Melvia Heaps [phone]) Specimen Source:  Source: Launa Grill Order Number: [id number] Lab site:

## 2010-02-24 NOTE — Progress Notes (Signed)
Summary: RX perscription for Lorazepam  Phone Note Outgoing Call   Call placed by: Joselyn Glassman,  February 01, 2010 1:26 PM Call placed to: Patient Summary of Call: LM for pt  on home phone.  Let him know that Gunnar Fusi wanted to perscribe him Lorazepam, he is to take 1/2 tablet at bedtime.  I advised him I faxed signed perscription to [facility] [place]. Initial call taken by: Joselyn Glassman,  February 01, 2010 1:38 PM

## 2010-02-28 ENCOUNTER — Ambulatory Visit: Payer: Self-pay | Admitting: Cardiology

## 2010-03-21 ENCOUNTER — Ambulatory Visit: Payer: Self-pay | Admitting: Internal Medicine

## 2010-04-04 LAB — CBC
HCT: 34.1 % — ABNORMAL LOW (ref 39.0–52.0)
HCT: 35.7 % — ABNORMAL LOW (ref 39.0–52.0)
HCT: 36 % — ABNORMAL LOW (ref 39.0–52.0)
HCT: 38.3 % — ABNORMAL LOW (ref 39.0–52.0)
HCT: 38.2 % — ABNORMAL LOW (ref 39.0–52.0)
Hemoglobin: 11.7 g/dL — ABNORMAL LOW (ref 13.0–17.0)
Hemoglobin: 11.9 g/dL — ABNORMAL LOW (ref 13.0–17.0)
Hemoglobin: 11.9 g/dL — ABNORMAL LOW (ref 13.0–17.0)
Hemoglobin: 12.6 g/dL — ABNORMAL LOW (ref 13.0–17.0)
Hemoglobin: 14.6 g/dL (ref 13.0–17.0)
MCH: 30.1 pg (ref 26.0–34.0)
MCH: 30.1 pg (ref 26.0–34.0)
MCH: 30.3 pg (ref 26.0–34.0)
MCH: 30.4 pg (ref 26.0–34.0)
MCH: 30.7 pg (ref 26.0–34.0)
MCHC: 33.9 g/dL (ref 30.0–36.0)
MCHC: 34.3 g/dL (ref 30.0–36.0)
MCHC: 35.6 g/dL (ref 30.0–36.0)
MCV: 87 fL (ref 78.0–100.0)
MCV: 87.1 fL (ref 78.0–100.0)
MCV: 87.2 fL (ref 78.0–100.0)
MCV: 87.4 fL (ref 78.0–100.0)
MCV: 86.2 fL (ref 78.0–100.0)
Platelets: 170 10*3/uL (ref 150–400)
Platelets: 183 10*3/uL (ref 150–400)
Platelets: 208 10*3/uL (ref 150–400)
Platelets: 188 10*3/uL (ref 150–400)
RBC: 3.91 MIL/uL — ABNORMAL LOW (ref 4.22–5.81)
RBC: 3.96 MIL/uL — ABNORMAL LOW (ref 4.22–5.81)
RBC: 3.99 MIL/uL — ABNORMAL LOW (ref 4.22–5.81)
RBC: 4.1 MIL/uL — ABNORMAL LOW (ref 4.22–5.81)
RBC: 4.16 MIL/uL — ABNORMAL LOW (ref 4.22–5.81)
RBC: 4.39 MIL/uL (ref 4.22–5.81)
RBC: 4.8 MIL/uL (ref 4.22–5.81)
RDW: 13.9 % (ref 11.5–15.5)
RDW: 14.1 % (ref 11.5–15.5)
RDW: 13.8 % (ref 11.5–15.5)
WBC: 10.5 10*3/uL (ref 4.0–10.5)
WBC: 12.2 10*3/uL — ABNORMAL HIGH (ref 4.0–10.5)
WBC: 4.6 10*3/uL (ref 4.0–10.5)
WBC: 4.9 10*3/uL (ref 4.0–10.5)
WBC: 5.5 10*3/uL (ref 4.0–10.5)
WBC: 7.4 10*3/uL (ref 4.0–10.5)

## 2010-04-04 LAB — COMPREHENSIVE METABOLIC PANEL WITH GFR
ALT: 14 U/L (ref 0–53)
Alkaline Phosphatase: 51 U/L (ref 39–117)
Chloride: 109 meq/L (ref 96–112)
Creatinine, Ser: 1.32 mg/dL (ref 0.4–1.5)
GFR calc Af Amer: >60 mL/min (ref >60)
Potassium: 3.4 meq/L — ABNORMAL LOW (ref 3.5–5.1)
Sodium: 143 meq/L (ref 135–145)

## 2010-04-04 LAB — BASIC METABOLIC PANEL
BUN: 10 mg/dL (ref 6–23)
CO2: 26 mEq/L (ref 19–32)
CO2: 26 mEq/L (ref 19–32)
CO2: 26 mEq/L (ref 19–32)
CO2: 27 mEq/L (ref 19–32)
Calcium: 8.9 mg/dL (ref 8.4–10.5)
Calcium: 8.9 mg/dL (ref 8.4–10.5)
Calcium: 9.1 mg/dL (ref 8.4–10.5)
Chloride: 107 mEq/L (ref 96–112)
Chloride: 108 mEq/L (ref 96–112)
Chloride: 109 mEq/L (ref 96–112)
Creatinine, Ser: 1.01 mg/dL (ref 0.4–1.5)
Creatinine, Ser: 1.01 mg/dL (ref 0.4–1.5)
Creatinine, Ser: 1.04 mg/dL (ref 0.4–1.5)
GFR calc Af Amer: >60 mL/min (ref >60)
GFR calc Af Amer: >60 mL/min (ref >60)
GFR calc Af Amer: >60 mL/min (ref >60)
GFR calc Af Amer: >60 mL/min (ref >60)
GFR calc Af Amer: >60 mL/min (ref >60)
GFR calc non Af Amer: >60 mL/min (ref >60)
Glucose, Bld: 111 mg/dL — ABNORMAL HIGH (ref 70–99)
Glucose, Bld: 78 mg/dL (ref 70–99)
Potassium: 3.8 mEq/L (ref 3.5–5.1)
Potassium: 4.1 mEq/L (ref 3.5–5.1)
Potassium: 5.4 mEq/L — ABNORMAL HIGH (ref 3.5–5.1)
Sodium: 138 mEq/L (ref 135–145)
Sodium: 139 mEq/L (ref 135–145)
Sodium: 140 mEq/L (ref 135–145)
Sodium: 140 mEq/L (ref 135–145)

## 2010-04-04 LAB — COMPREHENSIVE METABOLIC PANEL
AST: 16 U/L (ref 0–37)
Albumin: 3.7 g/dL (ref 3.5–5.2)
Albumin: 3.9 g/dL (ref 3.5–5.2)
BUN: 23 mg/dL (ref 6–23)
BUN: 13 mg/dL (ref 6–23)
CO2: 26 mEq/L (ref 19–32)
Calcium: 9 mg/dL (ref 8.4–10.5)
Creatinine, Ser: 1.09 mg/dL (ref 0.4–1.5)
GFR calc non Af Amer: 54 mL/min — ABNORMAL LOW (ref >60)
Glucose, Bld: 97 mg/dL (ref 70–99)
Glucose, Bld: 97 mg/dL (ref 70–99)
Total Bilirubin: 1.3 mg/dL — ABNORMAL HIGH (ref 0.3–1.2)
Total Protein: 6.3 g/dL (ref 6.0–8.3)
Total Protein: 6.7 g/dL (ref 6.0–8.3)

## 2010-04-04 LAB — CARDIAC PANEL(CRET KIN+CKTOT+MB+TROPI)
Relative Index: INVALID (ref 0.0–2.5)
Relative Index: INVALID (ref 0.0–2.5)
Total CK: 39 U/L (ref 7–232)

## 2010-04-04 LAB — URINE CULTURE
Colony Count: 15000
Culture  Setup Time: 201112131940

## 2010-04-04 LAB — DIFFERENTIAL
Basophils Absolute: 0 10*3/uL (ref 0.0–0.1)
Basophils Relative: 1 % (ref 0–1)
Eosinophils Absolute: 0.2 10*3/uL (ref 0.0–0.7)
Eosinophils Relative: 3 % (ref 0–5)
Lymphocytes Relative: 22 % (ref 12–46)
Lymphs Abs: 2.3 10*3/uL (ref 0.7–4.0)
Monocytes Absolute: 0.5 10*3/uL (ref 0.1–1.0)
Monocytes Relative: 10 % (ref 3–12)
Monocytes Relative: 8 % (ref 3–12)
Neutro Abs: 4.1 10*3/uL (ref 1.7–7.7)
Neutrophils Relative %: 44 % (ref 43–77)
Neutrophils Relative %: 69 % (ref 43–77)

## 2010-04-04 LAB — PROTIME-INR
INR: 0.98 (ref 0.00–1.49)
INR: 0.98 (ref 0.00–1.49)
INR: 1.02 (ref 0.00–1.49)
Prothrombin Time: 13.2 seconds (ref 11.6–15.2)

## 2010-04-04 LAB — HEPATIC FUNCTION PANEL
AST: 13 U/L (ref 0–37)
AST: 21 U/L (ref 0–37)
Albumin: 3.5 g/dL (ref 3.5–5.2)
Alkaline Phosphatase: 44 U/L (ref 39–117)
Alkaline Phosphatase: 49 U/L (ref 39–117)
Bilirubin, Direct: 0.2 mg/dL (ref 0.0–0.3)
Total Bilirubin: 1 mg/dL (ref 0.3–1.2)
Total Bilirubin: 1.4 mg/dL — ABNORMAL HIGH (ref 0.3–1.2)

## 2010-04-04 LAB — URINALYSIS, ROUTINE W REFLEX MICROSCOPIC
Glucose, UA: NEGATIVE mg/dL
Hgb urine dipstick: NEGATIVE
Ketones, ur: 15 mg/dL — AB
Protein, ur: NEGATIVE mg/dL

## 2010-04-04 LAB — LIPID PANEL
Cholesterol: 281 mg/dL — ABNORMAL HIGH (ref 0–200)
HDL: 37 mg/dL — ABNORMAL LOW (ref >39)
LDL Cholesterol: 206 mg/dL — ABNORMAL HIGH (ref 0–99)
Total CHOL/HDL Ratio: 7.6 ratio
Triglycerides: 192 mg/dL — ABNORMAL HIGH (ref <150)
VLDL: 38 mg/dL (ref 0–40)

## 2010-04-04 LAB — POCT I-STAT, CHEM 8
BUN: 26 mg/dL — ABNORMAL HIGH (ref 6–23)
Creatinine, Ser: 1.3 mg/dL (ref 0.4–1.5)
Glucose, Bld: 104 mg/dL — ABNORMAL HIGH (ref 70–99)
Sodium: 143 mEq/L (ref 135–145)
TCO2: 27 mmol/L (ref 0–100)

## 2010-04-04 LAB — GLUCOSE, CAPILLARY: Glucose-Capillary: 137 mg/dL — ABNORMAL HIGH (ref 70–99)

## 2010-04-04 LAB — AMYLASE
Amylase: 164 U/L — ABNORMAL HIGH (ref 0–105)
Amylase: 447 U/L — ABNORMAL HIGH (ref 0–105)

## 2010-04-04 LAB — HEPARIN LEVEL (UNFRACTIONATED)
Heparin Unfractionated: 0.35 IU/mL (ref 0.30–0.70)
Heparin Unfractionated: 0.62 IU/mL (ref 0.30–0.70)
Heparin Unfractionated: <0.1 IU/mL — ABNORMAL LOW (ref 0.30–0.70)

## 2010-04-04 LAB — POCT CARDIAC MARKERS

## 2010-04-04 LAB — TSH: TSH: 3.369 u[IU]/mL (ref 0.350–4.500)

## 2010-04-04 LAB — HEMOGLOBIN A1C
Hgb A1c MFr Bld: 5.4 % (ref <5.7)
Mean Plasma Glucose: 108 mg/dL (ref <117)

## 2010-04-04 LAB — MRSA PCR SCREENING: MRSA by PCR: NEGATIVE

## 2010-04-04 LAB — HEMOCCULT GUIAC POC 1CARD (OFFICE): Fecal Occult Bld: NEGATIVE

## 2010-04-04 LAB — APTT: aPTT: 28 seconds (ref 24–37)

## 2010-04-08 LAB — URINALYSIS, ROUTINE W REFLEX MICROSCOPIC
Glucose, UA: NEGATIVE mg/dL
Nitrite: POSITIVE — AB
Protein, ur: NEGATIVE mg/dL

## 2010-04-08 LAB — URINE CULTURE
Colony Count: NO GROWTH
Culture: NO GROWTH

## 2010-04-08 LAB — URINE MICROSCOPIC-ADD ON

## 2010-04-10 LAB — BASIC METABOLIC PANEL
Calcium: 8.9 mg/dL (ref 8.4–10.5)
GFR calc Af Amer: >60 mL/min (ref >60)
GFR calc non Af Amer: 59 mL/min — ABNORMAL LOW (ref >60)
Glucose, Bld: 105 mg/dL — ABNORMAL HIGH (ref 70–99)
Potassium: 4.3 mEq/L (ref 3.5–5.1)
Sodium: 143 mEq/L (ref 135–145)

## 2010-04-10 LAB — SURGICAL PCR SCREEN
MRSA, PCR: NEGATIVE
Staphylococcus aureus: NEGATIVE

## 2010-04-13 LAB — CBC
MCV: 90.4 fL (ref 78.0–100.0)
Platelets: 160 10*3/uL (ref 150–400)
Platelets: 193 10*3/uL (ref 150–400)
RBC: 4.17 MIL/uL — ABNORMAL LOW (ref 4.22–5.81)
RDW: 13.7 % (ref 11.5–15.5)
WBC: 5.3 10*3/uL (ref 4.0–10.5)

## 2010-04-13 LAB — TSH: TSH: 1.056 u[IU]/mL (ref 0.350–4.500)

## 2010-04-13 LAB — CARDIAC PANEL(CRET KIN+CKTOT+MB+TROPI)
CK, MB: 0.9 ng/mL (ref 0.3–4.0)
CK, MB: 1 ng/mL (ref 0.3–4.0)
Relative Index: INVALID (ref 0.0–2.5)
Relative Index: INVALID (ref 0.0–2.5)
Total CK: 37 U/L (ref 7–232)
Total CK: 42 U/L (ref 7–232)
Total CK: 42 U/L (ref 7–232)
Troponin I: 0.02 ng/mL (ref 0.00–0.06)

## 2010-04-13 LAB — POCT CARDIAC MARKERS: Troponin i, poc: <0.05 ng/mL (ref 0.00–0.09)

## 2010-04-13 LAB — LIPID PANEL
Cholesterol: 275 mg/dL — ABNORMAL HIGH (ref 0–200)
LDL Cholesterol: 209 mg/dL — ABNORMAL HIGH (ref 0–99)
VLDL: 31 mg/dL (ref 0–40)

## 2010-04-13 LAB — URINALYSIS, ROUTINE W REFLEX MICROSCOPIC
Bilirubin Urine: NEGATIVE
Glucose, UA: NEGATIVE mg/dL
Nitrite: NEGATIVE
Specific Gravity, Urine: 1.017 (ref 1.005–1.030)
pH: 6 (ref 5.0–8.0)

## 2010-04-13 LAB — BASIC METABOLIC PANEL
CO2: 31 mEq/L (ref 19–32)
Chloride: 109 mEq/L (ref 96–112)
GFR calc Af Amer: >60 mL/min (ref >60)
GFR calc non Af Amer: >60 mL/min (ref >60)
Glucose, Bld: 122 mg/dL — ABNORMAL HIGH (ref 70–99)
Glucose, Bld: 86 mg/dL (ref 70–99)
Potassium: 3.8 mEq/L (ref 3.5–5.1)
Potassium: 4.1 mEq/L (ref 3.5–5.1)
Sodium: 139 mEq/L (ref 135–145)
Sodium: 144 mEq/L (ref 135–145)

## 2010-04-13 LAB — MRSA CULTURE

## 2010-04-13 LAB — COMPREHENSIVE METABOLIC PANEL
ALT: 14 U/L (ref 0–53)
AST: 18 U/L (ref 0–37)
Albumin: 3.7 g/dL (ref 3.5–5.2)
CO2: 28 mEq/L (ref 19–32)
Chloride: 107 mEq/L (ref 96–112)
Creatinine, Ser: 1.07 mg/dL (ref 0.4–1.5)
GFR calc Af Amer: >60 mL/min (ref >60)
GFR calc non Af Amer: >60 mL/min (ref >60)
Potassium: 4 mEq/L (ref 3.5–5.1)
Sodium: 141 mEq/L (ref 135–145)
Total Bilirubin: 0.9 mg/dL (ref 0.3–1.2)

## 2010-04-13 LAB — URINE CULTURE

## 2010-04-13 LAB — SEDIMENTATION RATE: Sed Rate: 12 mm/hr (ref 0–16)

## 2010-04-13 LAB — C-REACTIVE PROTEIN: CRP: 0.4 mg/dL — ABNORMAL LOW (ref <0.6)

## 2010-04-13 LAB — TROPONIN I: Troponin I: 0.03 ng/mL (ref 0.00–0.06)

## 2010-04-13 LAB — URINE MICROSCOPIC-ADD ON

## 2010-04-13 LAB — HEPATIC FUNCTION PANEL
ALT: 17 U/L (ref 0–53)
AST: 15 U/L (ref 0–37)
Bilirubin, Direct: 0.2 mg/dL (ref 0.0–0.3)
Indirect Bilirubin: 1 mg/dL — ABNORMAL HIGH (ref 0.3–0.9)
Total Bilirubin: 1.2 mg/dL (ref 0.3–1.2)

## 2010-04-13 LAB — DIFFERENTIAL
Basophils Absolute: 0 10*3/uL (ref 0.0–0.1)
Eosinophils Absolute: 0.1 10*3/uL (ref 0.0–0.7)
Eosinophils Relative: 1 % (ref 0–5)
Lymphocytes Relative: 34 % (ref 12–46)
Monocytes Absolute: 0.5 10*3/uL (ref 0.1–1.0)

## 2010-04-13 LAB — CK TOTAL AND CKMB (NOT AT ARMC)
Relative Index: INVALID (ref 0.0–2.5)
Total CK: 50 U/L (ref 7–232)

## 2010-05-23 ENCOUNTER — Encounter: Payer: Self-pay | Admitting: Cardiology

## 2010-06-07 ENCOUNTER — Other Ambulatory Visit: Payer: Self-pay | Admitting: Cardiology

## 2010-06-07 NOTE — H&P (Signed)
NAMEDANDRAE, KUSTRA NO.:  [id number]   MEDICAL RECORD NO.:  [id number]          PATIENT TYPE:  EMS   LOCATION:  MAJO                         FACILITY:  [facility]   PHYSICIAN:  Wendi Snipes, MD DATE OF BIRTH:  1940-12-26   DATE OF ADMISSION:  12/29/2007  DATE OF DISCHARGE:                              HISTORY & PHYSICAL   CARDIOLOGIST:  Madolyn Frieze. Jens Som, MD, [facility].   PRIMARY CARE PHYSICIAN:  Burnell Blanks, M.D.   CHIEF COMPLAINTS:  Chest pain.   HISTORY OF PRESENT ILLNESS:  This is a 70 year old male with a history  of coronary disease, status post coronary artery bypass graft and  history of percutaneous interventions, here with chest pain over the  past 2 weeks.  The patient has a history of chronic pain that has been  bothersome for years.  However, he states that it has become more severe  and progressive in nature over the past 2 weeks.  He came into the ER  after his chest pain never went away completely today.  He has been  resting all and he states the pain has waxed and waned and has radiated  from the shoulder to his back to his mid chest.  He denies shortness of  breath, nausea, dizziness, syncope or palpitations with these symptoms.  He does endorse increased in paroxysmal nocturnal dyspnea.  He wakes up  with shortness breath in the morning.  He does not report any increased  lower extremity edema.  He states that he has been taking all his  medications.   PAST MEDICAL HISTORY:  1. Coronary disease status post coronary bypass graft 2001 with a LIMA      to the LAD and the left radial graft to the OM, status post PCI in      2006 with drug-eluting stent to the left main coronary artery and      the right coronary artery.  His most recent evaluation was Myoview      in February 2008 which showed an ejection fraction of 44% and no      ischemia noted.  States that he had one more recently approximately      6 months ago; however, I am unaware;  at Dr. Ludwig Clarks office.  2. Chronic chest pain.  3. Hypertension.  4. Hyperlipidemia.  5. GERD with hiatal hernia.  6. Bilateral inguinal hernias.   MEDICATIONS ON ADMISSION:  1. Aspirin 81 mg daily.  2. Zocor 20 mg daily.  3. Fish oil.  4. Tricor 12.5 mg daily.  5. Enalapril 2.5 mg twice daily.  6. Lopressor 50 mg twice daily.  7. Protonix 40 mg daily.   SOCIAL HISTORY:  [place].  He is a  retired Freight forwarder.  He does not smoke or use alcohol.   FAMILY HISTORY:  Significant for early coronary disease.   REVIEW OF SYSTEMS:  All 14 systems were reviewed and were negative  except as mentioned in HPI.   PHYSICAL EXAMINATION:  VITAL SIGNS:  Blood pressure was 174/85,  respiratory rate  16 breaths per minute.  Pulse is 62 beats per minute.  Oxygen saturation 98% on room air.  GENERAL:  He is a 70 year old white male appearing his stated age.  No  acute distress.  HEENT:  Moist mucous membranes.  Pupils equal, round and react to light  and accommodation.  Anicteric sclera.  NECK:  No jugular venous distention.  No thyromegaly.  CARDIOVASCULAR:  Regular rate and rhythm.  No murmurs, rubs or gallops.  LUNGS:  Clear to auscultation bilaterally.  ABDOMEN:  Nontender, nondistended.  Positive bowel sounds.  No masses.  EXTREMITIES:  No clubbing, cyanosis, edema.  NEUROLOGIC: Alert and oriented x3.  Cranial nerves II-XII grossly  intact.  No focal neurologic deficits.  SKIN:  Warm, dry and intact.  No rashes.  PSYCHIATRIC:  Mood and affect are appropriate.   RADIOLOGY:  Radiology showed increased cardiac silhouette with no acute  changes.   EKG shows rate of 60 beats per minute, normal sinus rhythm with a left  bundle branch block, unchanged from a previous study with no ST-T wave  changes.   LABORATORY EVALUATION:  His white blood cell count is 5.4, hematocrit  41, platelets 178,000, creatinine is 1.3.  First set of cardiac enzymes   were not elevated.   ASSESSMENT:  This is a 71 year old with history for coronary disease as  stated above with chronic chest pain.  He is here for evaluation of  progressive chest pain over the past 2 weeks with rest pain today.  Chest pain.  His chest pain is chronic and symptoms currently are  worrisome for unstable angina.  However,  somewhat atypical in nature.  We will admit him for a full set of cardiac enzymes and begin full-dose  anticoagulation if there is any suggestion of ischemia.  He is currently  pain free.  I believe we should start him on  long-acting nitrates.  Will begin Imdur 30 mg daily.  Consider restarting his Plavix; however,  he states that he is to have bilateral inguinal hernia repair soon.  I  believe that in context of a recent Myoview test negative that he e  should be candidate for cardiac cath to reevaluate coronary anatomy at  this point.  Otherwise will continue his current medications.      Wendi Snipes, MD  Electronically Signed     BHH/MEDQ  D:  12/29/2007  T:  12/30/2007  Job:  [id number]

## 2010-06-07 NOTE — Discharge Summary (Signed)
[name] [name], HALLQUIST NO.:  [id number]   MEDICAL RECORD NO.:  [id number]          PATIENT TYPE:  INP   LOCATION:  2922                         FACILITY:  [facility]   PHYSICIAN:  Madolyn Frieze. Jens Som, MD, FACCDATE OF BIRTH:  Jun 11, 1940   DATE OF ADMISSION:  12/29/2007  DATE OF DISCHARGE:  01/01/2008                               DISCHARGE SUMMARY   PROCEDURES:  1. Cardiac catheterization.  2. Coronary arteriogram.  3. Left ventriculogram.  4. Left internal mammary artery arteriogram.  5. Saphenous vein angiogram.   PRIMARY FINAL DISCHARGE DIAGNOSIS:  Chest pain, medical therapy for  coronary artery disease.   SECONDARY DIAGNOSES:  1. Status post aortocoronary bypass surgery in 2001 with left internal      mammary artery to left anterior descending, left radial to diagonal      1 and obtuse marginal 1.  2. Status post drug-eluting stent to the protected left main extending      in the circumflex as well as bare-metal stent to the right coronary      artery ostium in 2006.  3. History of noncardiac chest pain.  4. Status post Myoview in February 2008 showing an ejection fraction      of 44%, no ischemia.  5. Hypertension.  6. Hyperlipidemia.  7. Gastroesophageal reflux disease.  8. Hiatal hernia.  9. Bilateral inguinal hernias.  10.Mild cerebrovascular disease.  11.Left bundle-branch block.  12.History of elevated bilirubin.  13.Remote history of gastric ulcers.  14.Family history of coronary artery disease.  15.Ongoing tobacco use.  16.History of medical noncompliance secondary to financial issues.   TIME AT DISCHARGE:  41 minutes.   HOSPITAL COURSE:  [name] [name] is a 70 year old male with a history of  coronary artery disease who has been having chest pain over the last 2  weeks.  He came to the emergency room where he was admitted for further  evaluation and treatment.   His cardiac enzymes were negative for MI.  His total cholesterol was  228,  triglycerides 126, HDL 27, and LDL 176.  It was felt that cardiac  catheterization was indicated to further define his anatomy and this was  performed on December 31, 2007.   The LIMA to LAD was patent with good runoff.  The sequential radial  graft was patent to the diagonal, but the second limb to the OM was not  patent.  The left main stent had about 50-60% narrowing distal to the  stent, but the stent itself had less than 30% narrowing and was patent.  There was a marginal branch with a 40-50% narrowing and the takeoff of  the AV circumflex and the second marginal had an 80% narrowing.  The RCA  at the previous stent site had less than 30% narrowing.  There was about  a 50-60% plaque in the proximal/mid vessel.  The 80% stenosis at the AV  circumflex and OM-2 was not ideal for percutaneous intervention.  An  initial attempt at medical therapy was felt optimal.   On January 01, 2008, [name] [name] was evaluated by Dr. Jens Som.  Dr.  Crenshaw felt that medical therapy was indicated at this time and he was  started on Imdur.  He is to decide about the need for Myoview after [name] returns to the office.  Given his history of chronic chest pain,  percutaneous intervention of the OM might not improve his symptoms, as  his chest pain is essentially continuous.  Therefore, medical therapy is  a more favorable option at this time.  Dr. Jens Som evaluated [name] [name]  and felt that he was stable for discharge with close outpatient  followup.   DISCHARGE INSTRUCTIONS:  His activity level is to be increased  gradually.  He is not to drive a car for 2 days and not to lift anything  for a week.  He is to call our office for problems with cath site.  He  is to stick to a low-fat diet.  He is to follow up with Dr. Jens Som on  January 13, 2008 at 8:45 and with Dr. Nathanial Rancher as needed.   DISCHARGE MEDICATIONS:  1. Aspirin 81 mg daily.  2. Zocor 20 mg daily.  3. Fish oil daily.  4. Tricor 145  mg daily.  5. Enalapril 2.5 mg b.i.d.  6. Metoprolol 50 mg b.i.d.  7. Protonix 40 mg a day.  8. Imdur 30 mg a day.  9. Nitroglycerin sublingual p.r.n.      [name] Demark, PA-C      Madolyn Frieze. Jens Som, MD, [facility]  Electronically Signed    RB/MEDQ  D:  01/01/2008  T:  01/02/2008  Job:  [id number]   cc:   Burnell Blanks, MD

## 2010-06-07 NOTE — Assessment & Plan Note (Signed)
[name] [name]                            CARDIOLOGY OFFICE NOTE   NAME:[name] [name] GRINDER                       MRN:          [id number]  DATE:01/20/2008                            DOB:          Apr 11, 1940    [name] [name] is a 70 year old gentleman who has a history of coronary  artery disease, chronic chest pain, and hypertension.  He was recently  admitted to [facility] secondary to chest pain on December 29, 2007.  He did rule out for myocardial infarction with serial enzymes.  He ultimately underwent cardiac catheterization by Dr. Riley Kill on  December 31, 2007.  He was found to have a patent LIMA to the LAD.  The  radial graft which was a sequential graft was patent into the  intermediate.  The second limb was not patent and was a chronic issue.  The left main demonstrated a previously-stented vessel overlap in the  origin of an intermediate.  This has 50-60% narrowing in the  intermediate.  There was an 80% narrowing in the second obtuse marginal.  There was 50-60% stenosis in the right coronary artery.  There was  normal LV function.  It was felt that medical therapy was indicated.  Since then, he continues to have chest pain.  However, he states his  chest pain has been present now for approximately 50 years.  It is  almost continuous in nature.  It does not change with exertion nor is it  pleuritic or positional.  He does states he gets worse between 5 o'clock  in the afternoon to 7 in the evening.  It is not associated with  shortness of breath, nausea, vomiting, or diaphoresis, and it does not  radiate.   MEDICATIONS:  1. Reglan 5 mg p.o. b.i.d.  2. Prevacid 50 mg p.o. b.i.d.  3. Metoprolol 75 mg p.o. b.i.d.  4. Amlodipine 2.5 mg p.o. b.i.d.  5. Enalapril 20 mg p.o. b.i.d.  6. Benadryl.  7. Aspirin 81 mg daily.  8. Fish oil.   PHYSICAL EXAMINATION:  VITAL SIGNS:  Today shows a blood pressure of  138/78 and his pulse is 62.  HEENT:   Normal.  NECK:  Supple.  CHEST:  Clear.  CARDIOVASCULAR:  Regular rate and rhythm.  ABDOMEN:  No tenderness.  His right groin shows no hematoma, no bruit.  EXTREMITIES:  No edema.   His electrocardiogram shows a sinus rhythm at a rate of 62.  There is  left bundle-branch block.   DIAGNOSES:  1. Chest pain - Mr. [name] [name]'s symptoms are extremely atypical and      chronic in nature.  His recent catheterization is outlined above      and medical therapy was recommended.  2. Coronary artery disease status post coronary artery bypassing graft      - he will continue on his aspirin, beta-blocker, and ACE inhibitor.      He has not tolerated statins per his wife as it has caused      myalgias.  3. Statin intolerance secondary to myalgias.  4. History of left bundle-branch block.  5. Gastroesophageal reflux disease.  6. Hypertension - his blood pressures adequately controlled on his      present medications.  7. Cerebrovascular disease - he will need followup carotid Dopplers in      March.  8. Hyperlipidemia - he will continue on his fish oil and as per above,      he has not tolerated statins.  9. History of SEAFOOD allergy.   I will see back in 9 months.     Madolyn Frieze Jens Som, MD, [facility]  Electronically Signed    BSC/MedQ  DD: 01/20/2008  DT: 01/21/2008  Job #: [id number]

## 2010-06-07 NOTE — Cardiovascular Report (Signed)
NAMEVIRGAL, WARMUTH NO.:  [id number]   MEDICAL RECORD NO.:  [id number]          PATIENT TYPE:  INP   LOCATION:  2922                         FACILITY:  [facility]   PHYSICIAN:  Arturo Morton. Riley Kill, MD, FACCDATE OF BIRTH:  11-29-1940   DATE OF PROCEDURE:  12/31/2007  DATE OF DISCHARGE:                            CARDIAC CATHETERIZATION   INDICATIONS:  Mr. Nees is a 70 year old who has had prior stenting of  the left main that is protected as well as ostium of the right.  He has  had prior bypass surgery with a second limb of a radial graft known to  be occluded.  His last cath revealed a patent internal mammary and the  left main was stented leading into the circumflex in a steep bend.  He  has had increasing chest pain over the past few weeks but he does have a  history of chronic chest discomfort.  Current studies done to assess  coronary anatomy.  Enzymes have been negative.   PROCEDURE:  1. Left heart catheterization.  2. Selective coronary arteriography.  3. Selective left ventriculography.  4. Saphenous vein graft angiography.  5. Selective left internal mammary angiography.   DESCRIPTION OF PROCEDURE:  The patient was brought to the  Catheterization Laboratory and prepped and draped in the usual fashion.  Through an anterior puncture, the right femoral artery was easily  entered.  A 5-French sheath was then placed.  Following this, views of  the left and right coronary arteries were obtained in multiple  angiographic projections.  The radial graft was injected as was the  internal mammary.  There were no major complications.  Central aortic  and left ventricular pressures were measured with pigtail.  Ventriculography was performed in the RAO projection.  Following a  pressure pullback, the pigtail was removed.  I then carefully compared  the old films and the new current films.  Based on the anatomic findings  and talking with the patient, we elected to  recommend a continued  medical program.   There were no major complications.   HEMODYNAMIC DATA:  1. The central aortic pressure is 167/75, mean 109.  2. Left ventricular pressure 186/17.  3. There was about a 5-mm gradient pullback across aortic valve.   ANGIOGRAPHIC DATA:  1. The internal mammary separately to the distal LAD is widely patent      with good runoff into the distal LAD.  2. The radial graft which was a sequential graft previously now is      widely patent into the intermediate or to the diagonal.  The second      limb into the OM is not patent.  3. The left main demonstrates a previously stented vessel overlapping      the origin of an intermediate.  This has about 50-60% narrowing in      the intermediate.  The actual stent itself has less than 30%      narrowing and is relatively patent throughout and stretches into      the proximal circumflex artery.  The circumflex then provides a  fairly large marginal branch with about 40-50% narrowing where the      takeoff of the AV circumflex into the second marginal branch is.      This has about an 80% area of narrowing into the second OM.  It is      at right angle to the initial vessel.  It has progressed somewhat      from the previous study but is only a modest size vessel.   The right coronary artery has been previously stented at the ostium and  this demonstrates less than 30% narrowing and no damping.  There was  about 50-60% segmental plaque in the proximal vessel at its junction to  the midvessel.  The remainder of the right coronary artery is without  critical disease.   The ventriculogram demonstrates preserved global systolic function  without a definite wall motion abnormality.   CONCLUSIONS:  1. Patent radial graft to the diagonal with known occlusion to the      distal limb to the marginal.  2. Continued patency of the IMA to the LAD.  3. Continued patency of left main stent leading into the  circumflex.  4. Bifurcation disease involving the first marginal and the AV      circumflex with about 80% narrowing at the AV portion.  5. Continued patency of the RCA ostial stent.   DISCUSSION:  The radial graft to the internal mammary graft are patent.  The stents previously placed continue to remain patent.  There was a  fairly large marginal branch with some compromise but it does not appear  to be high-grade.  Coming off just at this location is a bifurcation  stenosis of about 80% going into the AV circumflex leading into the OM-  2.  This is not ideal for percutaneous intervention.  Because of the  left main stent that is wrapped around the left main into the  circumflex, it would be difficult to traverse this area with a stent and  there is a bifurcation lesion that is not ideal for percutaneous  intervention.  An initial attempt at medical therapy which would be  optimal with possible nuclear imaging to assess ischemia.  Importantly,  the patient also says that he has a significant amount of reflux and it  feels somewhat like this.  Therefore, conservative management is  warranted.  Follow up will be with Dr. Jens Som.      Arturo Morton. Riley Kill, MD, [facility]  Electronically Signed     TDS/MEDQ  D:  12/31/2007  T:  01/01/2008  Job:  [id number]   cc:   Madolyn Frieze. Jens Som, MD, [facility]

## 2010-06-07 NOTE — Assessment & Plan Note (Signed)
[facility]                            CARDIOLOGY OFFICE NOTE   NAME:[name] [name], [name] [name]                       MRN:          [id number]  DATE:03/14/2007                            DOB:          [birth year]    [name] [name] is a 70 year old male with past medical history of coronary  disease, chronic chest pain who were asked to evaluate prior to hernia  repair surgery.  His cardiac history dates back to November 14, 1999.  At  that time he had coronary bypassing graft with LIMA to the LAD and a  RIMA to the diagonal and obtuse marginal.  Most recent catheterization  in November 2006 showed an 80% left main which was treated with a stent.  He also had a stent to the right coronary artery at that time.  Note the  LIMA to the LAD was patent and RIMA to the diagonal was patent.  Second  OM to the obtuse marginal was total.  The patient does have chronic  chest pain as well.  His most recent Myoview was performed on March 14, 2006.  That time his ejection fraction was felt to be 44% and there  was no ischemia noted.  He is scheduled to undergo bilateral hernia  repair.  We were asked to evaluate prior to his surgery.  Note he denies  any dyspnea on exertion, orthopnea, PND, pedal edema, palpitations,  presyncope or syncope.  He does occasionally feel dizzy which has been a  chronic problem.  He also continues to have chest pain and had this for  30 years.  The pain is more common after he eats and is not exertional.  He describes a burning sensation.  No associated nausea, shortness  breath or diaphoresis.  Is not pleuritic positional.  It lasts all day  at times.  Because of the above we were asked to evaluate  preoperatively.  His medications include aspirin 81 mg daily, Zocor 20  mg daily, fish oil, Tricor 25 mg tablets 1/2 p.o. daily, enalapril 2.5  mg p.o. b.i.d. Lopressor 50 mg p.o. b.i.d. Protonix 40 mg p.o. b.i.d.,  Benadryl and Reglan.  He has an allergy  to MORPHINE also to [facility].   SOCIAL HISTORY:  He does not smoke nor does he consume alcohol.   FAMILY HISTORY:  Is positive coronary disease.   PAST MEDICAL HISTORY:  Significant for hypertension as well as  hyperlipidemia.  There is no diabetes mellitus.  He has a history of  gastric reflux disease in the hiatal hernia.  He has as history of  coronary disease and status post coronary artery bypass grafting as  outlined in HPI.  He has a chronic left bundle branch block.  He has had  a prior cholecystectomy.   REVIEW OF SYSTEMS:  He denies any headaches or fevers, chills.  There is  no productive cough or months.  No dysphagia.  The patient melena or  hematochezia.  There is no dysuria.  No rash or seizure activity.  There  is no orthopnea, PND or pedal edema.  The remaining  systems are negative  other than his chest pain in reflux.   PHYSICAL EXAM:  Today shows a blood pressure 190/88.  His pulse is 59.  Weight 150 pounds.  He is well-developed, well-nourished distress.  Skin is warm, dry.  Not depressed.  There are no peripheral clubbing.  BACK:  Normal.  HEENT:  Normal with normal eyelids.  His neck is supple with a normal upstroke bilaterally.  There is left  carotid bruit noted.  There is no jugular distention and no thyromegaly  is noted.  Chest is clear to auscultation with normal expansion.  CARDIOVASCULAR:  Regular rhythm.  There is a to 1-2/6 systolic murmur  left sternal border.  There is no S3-S4.  ABDOMEN:  Exam nontender.  Positive bowel sounds.  No hepatosplenomegaly  no mass appreciated.  No abdominal bruit.  Note his left radial pulse is diminished due to his previous use of the  radial artery for his bypass grafting.  He has 2+ femoral pulses  bilaterally.  No bruits noted.  EXTREMITIES:  Show no edema palpate no cords.  Has 2+ posterior tibial  pulses bilaterally.  NEUROLOGIC:  Grossly intact.   His electrocardiogram shows a sinus rhythm at a rate at  59.  There is  left bundle branch block.   DIAGNOSES:  1. Preoperative evaluation prior to hernia repair - Mr. Ho does      have a history of coronary disease status bypassing graft and is      very difficult evaluate due to his long history of chronic chest      pain.  We will schedule him for Myoview for risk stratification.      If it shows no ischemia, then I think it would be safe for him to      proceed with surgery.  2. Coronary disease status post bypass graft - he will continue on his      aspirin, statin, beta blocker and ACE inhibitor.  3. Chronic chest pain.  4. History of left bundle branch block.  5. Gastric reflux disease/peptic ulcer disease - he will continue on      his Protonix.  6. Hypertension - blood pressure is elevated today.  He does state it      typically runs in 120-130 range.  However, I have asked him to      increase his enalapril 10 mg p.o. b.i.d.  We will check a BMET in 1      week to follow potassium and renal function.  7. Hyperlipidemia - we will check lipids and liver adjust as      indicated.  He will continue on his statin for now.  8. History of cerebrovascular disease - he is overdue for carotid      Doppler follow-up and we will range those.  9. History of SEAFOOD allergy causing facial swelling.   If the above is unremarkable to we will see him back in 12 months.  He  will continue with diet, exercise.  Note he does not smoke.     Madolyn Frieze Jens Som, MD, [facility]  Electronically Signed    BSC/MedQ  DD: 03/14/2007  DT: 03/15/2007  Job #: [id number]   cc:   Dr. Georgiana Shore

## 2010-06-10 NOTE — Cardiovascular Report (Signed)
NAMEMarland Kitchen  RAFAN, SANDERS NO.:  [id number]   MEDICAL RECORD NO.:  [id number]                   PATIENT TYPE:  INP   LOCATION:  3703                                 FACILITY:  [facility]   PHYSICIAN:  Rollene Rotunda, M.D.                DATE OF BIRTH:  12/21/1940   DATE OF PROCEDURE:  07/01/2003  DATE OF DISCHARGE:                              CARDIAC CATHETERIZATION   PRIMARY DOCTOR:  Dr. Fayrene Fearing in [place].   PROCEDURE:  Left heart catheterization/coronary arteriography.   INDICATIONS:  A patient with chest pain and Cardiolite suggesting  cardiomyopathy.  Previous CABG.   PROCEDURE NOTE:  Left heart catheterization was performed via the right  femoral artery.  The artery was cannulated using anterior wall puncture.  A  #6-French arterial sheath was inserted via the modified Seldinger technique.  Preformed Judkins and a pigtail catheter were utilized.  The patient  tolerated the procedure well and left the lab in stable condition.   RESULTS:   HEMODYNAMICS:  LV 183/27, AO 183/98.   CORONARIES:  The left main had ostial 25% stenosis and a long distal 80%  stenosis.  The LAD had a long proximal 80% stenosis before a large first  diagonal.  The diagonal had large proximal diffuse 75% stenosis.  The  circumflex in the A-V groove was normal.  There was a large OM-1 with a  proximal 50% to 60% stenosis.  An OM-2 was large with proximal 40% stenosis.  The right coronary artery was dominant.  There was ostial 50% stenosis.  There was a long proximal 40% stenosis.   GRAFTS:  The LIMA to the LAD was widely patent and free of disease.  A free  radial sequential to the diagonal was patent to the diagonal; however, the  reported sequential segment to the large obtuse marginal was not visualized  and, therefore, presumed to be occluded.  There was some backfilling of this  large diagonal via the flow from the radial to the diagonal.  The marginal  filled briskly via  native flow.   LEFT VENTRICULOGRAM:  The left ventriculogram was obtained in the ROA  projection.  The EF was approximately 40% with mild anterior hypokinesis,  inferobasal akinesis, and global hypokinesis.   CONCLUSION:  Three-vessel coronary artery disease.  Two of three grafts  patent.  Mild left ventricular dysfunction.   PLAN:  The patient can have an outpatient echocardiogram.  He will have  medical management which will include addition of an ACE inhibitor and up-  titration of his beta blockers.  At this point, I do not see a need for  intervention.  If he has progressive symptoms, we could consider stenting of  the left main to protect the large circumflex distribution; however, there  does not appear to be ischemia on the Cardiolite at this point.  Rollene Rotunda, M.D.   Derinda Sis  D:  07/01/2003  T:  07/02/2003  Job:  [id number]

## 2010-06-10 NOTE — Discharge Summary (Signed)
[name] [name], [name] [name] NO.:  [id number]   MEDICAL RECORD NO.:  [id number]          PATIENT TYPE:  INP   LOCATION:  3707                         FACILITY:  [facility]   PHYSICIAN:  Madolyn Frieze. Jens Som, MD, FACCDATE OF BIRTH:  Jan 07, 1941   DATE OF ADMISSION:  12/02/2005  DATE OF DISCHARGE:  12/03/2005                                 DISCHARGE SUMMARY   PROCEDURES:  None.   TIME OF DISCHARGE:  38 minutes.   PRIMARY DIAGNOSIS:  Chest pain.   SECONDARY DIAGNOSIS:  1. Gastroesophageal reflux disease symptoms.  2. Hyperlipidemia with a total cholesterol 252, triglycerides 302, HDL 30,      LDL 162.  3. Recent sinus infection.  4. History of left bundle branch block.  5. ALLERGY OR INTOLERANCE TO AUGMENTIN SHELLFISH.  6. Status post cholecystectomy.  7. Hypertension.  8. Status post aortocoronary bypass surgery in 1997 with free radial graft      to D1 and circumflex.  9. LIMA to LAD.  10.Status post cardiac catheterization November 2006 with a bare metal      stent to the RCA and with a Taxus stent to the left main.  11.Preserved left ventricular function with an EF of 60% at cath in 2006.  12.Non-cardiac chest pain.  13.Premature coronary artery disease in his siblings.   HOSPITAL COURSE:  [name] is a 70 year old male with known coronary artery  disease and non-cardiac chest pain.  His chest pain got worse on the day of  admission, and he came to the emergency room where he was admitted for  further evaluation and treatment.  His cardiac enzymes were negative for MI.  He was hyperlipidemic with an LDL of 152 but had not been taking medication  recently, secondary to cost.  He had also not been taking his Plavix  recently for the same reason.  He was taking his other medications which  included aspirin, enalapril and metoprolol.  He had had to cut back on his  Protonix but was still taking one dose a day.   On December 03, 2005, [name] was evaluated by Dr.  Jens Som.  He was  stable from a cardiac standpoint and is to follow up with an outpatient  Myoview and carotid Dopplers, because of his carotid bruit.  He is to follow  up with Dr. Jens Som as well.  A case management consult has been called to  see if there is any help available to assist him in obtaining his  medications.  [name] was otherwise considered stable for discharge on  December 03, 2005 with outpatient followup to be arranged.   DISCHARGE INSTRUCTIONS:  1. His activity is to increase slowly.  He is to stick to a low-fat diet.      He is to follow up with Dr. Jens Som, and our office will contact him      regarding that appointment, as well as carotid Dopplers and an      outpatient Myoview.   DISCHARGE MEDICATIONS:  1. Enalapril 5 mg b.i.d.  2. Aspirin 81 mg a day.  3.  Protonix 40 mg b.i.d.  4. Metoprolol 50 mg b.i.d.  5. Plavix 75 mg q.d.  6. Nitroglycerin sublingual p.r.n.      Theodore Demark, PA-C      Madolyn Frieze. Jens Som, MD, [facility]  Electronically Signed    RB/MEDQ  D:  12/03/2005  T:  12/03/2005  Job:  [id number]   cc:   Dr. Nathanial Rancher

## 2010-06-10 NOTE — Cardiovascular Report (Signed)
[facility]  Patient:    [name] [name], [name] [name]                       MRN: [id number] Proc. Date: 11/21/99 Adm. Date:  [id number] Attending:  Nathen May CC:         Madolyn Frieze. Jens Som, M.D. [facility]   Cardiac Catheterization  PROCEDURE PERFORMED:  Coronary arteriography.  INDICATIONS:  Recurrent chest pain, history of moderate left main and LAD disease.  CORONARY ARTERIOGRAPHY:  Left main coronary artery:  The left main coronary artery had a 70-80% distal discrete stenosis.  There was consistent catheter damping with engagement.  Left anterior descending artery:  The left anterior descending artery had an 80% eccentric lesion in the mid vessel.  This included the takeoff of the first diagonal branch.  The distal flow was normal.  Circumflex coronary artery:  The circumflex coronary artery was normal.  Right coronary artery:  The right coronary artery had 30-40% multiple discrete lesions in the proximal and mid vessel.  RIGHT ANTERIOR OBLIQUE VENTRICULOGRAPHY:  The RAO ventriculography revealed mild anteroapical wall hypokinesis.  Ejection fraction was 55%.  There was no gradient across the aortic valve and no MR.  LV pressure is 146/23 and aortic pressure was 142/73.  IMPRESSION:  The films were reviewed with Dr. Juanda Chance.  We both agreed that the left main disease had progressed and precluded any percutaneous intervention. The left anterior descending certainly is tight enough to warrant intervention as evidenced by the mild anteroapical wall motion abnormality.  Since we cannot approach the left anterior descending through the left main disease he will be referred for coronary artery bypass graft. DD:  11/21/99 TD:  11/21/99 Job: [id number]

## 2010-06-10 NOTE — Discharge Summary (Signed)
[place]  Patient:    [name] [name], [name] [name]                       MRN: [id number] Adm. Date:  [id number] Disc. Date: [id number] Attending:  Charlett Lango Dictator:   Lissa Hoard, P.A. CC:         Madolyn Frieze. Jens Som, M.D. [facility]                           Discharge Summary  DATE OF BIRTH:  11-Mar-1940  ADMISSION DIAGNOSIS:  Chest pain.  DISCHARGE DIAGNOSES: 1. Coronary artery disease. 2. Status post coronary artery bypass graft x 3.  ADMISSION HISTORY:  This is a 70 year old man who was complaining of severe substernal chest pain that radiated to his throat.  Because of his pain, he went to the [facility] Emergency Department where he was given nitroglycerin which relieved the pain.  Pain was associated with some shortness of breath and diaphoresis; however, no nausea or vomiting was associated with this problem.  PHYSICAL EXAMINATION ON ADMISSION:  VITAL SIGNS:  On physical exam he was found to have a blood pressure of 124/76, pulse 74 and regular, respirations 14.  HEENT:  Within normal limits.  NECK:  No carotid bruits, thyromegaly, lymphadenopathy, or JVD.  CHEST:  Lungs were clear to auscultation bilaterally.  CARDIOVASCULAR:  Regular rate and rhythm without murmurs, gallops, or rubs.  ABDOMEN:  Soft, nontender, nondistended with positive bowel sounds in all four quadrants.  EXTREMITIES:  No cyanosis, clubbing, or edema.  NEUROLOGIC:  Grossly intact.  HOSPITAL COURSE:  Because of the patients symptoms, he was referred to cardiologist where he underwent cardiac catheterization which revealed diffuse coronary artery disease.  Ejection fraction at this time was found to be 55%. Because of catheterization findings, the patient was referred to Dr. Dorris Fetch for possible surgical intervention.  November 22, 1999, the patient underwent a coronary artery bypass grafting x 3 with the left internal mammary artery anastomosed to  the left anterior descending, left radial artery sequentially anastomosed to the obtuse marginal and first diagonal.  The procedure was performed by Dr. Dorris Fetch under general endotracheal anesthesia and cardiopulmonary bypass without any complications.  The patient was transferred to the SICU in stable condition, atrial paced.  The patient was transfused 2 units of fresh frozen plasma and 8 units of platelets secondary to coagulopathy postoperatively.  His cardiac index was found to be 2.85.  He was extubated later on the operative day.  Postop day #1, the patient was found to be in normal sinus rhythm with a hemoglobin and hematocrit of 8 and 23 which remained stable throughout the remainder of his hospital course.  His BUN and creatinine were also found to be 13 and 1.1 at this time which also remained stable.  The patient was not requiring oxygen therapy on postoperative day #1, and this remained stable throughout the remainder of hospital course.  The patients mediastinal tubes were discontinued on postoperative day #1, and he was found to have cardiac index of 3.9.  Postop day #2, the patients remaining chest tube was discontinued, and he was transferred out of the SICU to the step-down unit.  He was begun mobilizing which he did well.  By postoperative day #3, the patient was found to be stable with the exception of persistent nausea and vomiting.  By postoperative day #4, the patients nausea and  vomiting began to slowly resolve, and he was tolerating p.o. intake.  Subsequent discharge planning was made for postoperative day #5 if nausea and vomiting remained stable and the patient was tolerating p.o. intake well.  DISCHARGE MEDICATIONS: 1. Aspirin 325 mg one tablet q.d. 2. Darvocet-N 100 one to two tablets q.4-6h. p.r.n. pain. 3. Lopressor 25 mg one-half tablet q.12h. 4. Imdur 30 mg p.o. q.d. 5. Prevacid 30 mg p.o. q.d. 6. Lasix 40 mg one tablet q.d. 7. K-Dur 20 mEq one  tablet q.d.  DISCHARGE INSTRUCTIONS:  Activity:  The patient is told no driving, lifting objects over 10 pounds, or other strenuous activity.  Diet:  The patient is instructed to consume a low-fat low-salt diet.  Wound care:  The patient is instructed he may shower and clean wounds with mild soap and water.  DISPOSITION:  Home.  DISCHARGE FOLLOWUP:  The patient is instructed to follow up with Dr. Jens Som, his cardiologist, on December 09, 1999, at 12 oclock.  He is instructed he will have an x-ray taken at this time and should bring this x-ray to Dr. Rondel Jumbo office with him.  The patient is also instructed to follow up with Dr. Dorris Fetch at the [facility] office in three weeks, and he was told the office will call him to verify time and date of this appointment. DD:  11/26/99 TD:  11/28/99 Job: [id number]

## 2010-06-10 NOTE — H&P (Signed)
NAMEMarland Kitchen  [name] [name], [name] [name] NO.:  [id number]   MEDICAL RECORD NO.:  [id number]                   PATIENT TYPE:  EMS   LOCATION:  MAJO                                 FACILITY:  [facility]   PHYSICIAN:  Olga Millers, M.D. [facility]            DATE OF BIRTH:  August 17, 1940   DATE OF ADMISSION:  06/29/2003  DATE OF DISCHARGE:                                HISTORY & PHYSICAL   PRESENTING PROBLEM:  I have been having chest pain for the last 48 hours.   HISTORY OF PRESENT ILLNESS:  [name] [name] [name] is a 70 year old male who gives a  history of left main disease and is status post coronary artery bypass  grafting surgery in 2001.  At that time grafts were placed from the LIMA to  the LAD, and from a left radial artery from the aorta sequentially to the  first diagonal and then to the first obtuse marginal.  The patient complains  that he also has a history of left bundle branch block and hypertension.  He  has unknown lipid status at present.  He also has hiatal hernia and GERD and  a very strong family history of coronary artery disease.  Starting on the  morning of Saturday, 48 hours ago, he had chest pain at rest, 8/10 in  intensity.  There was no radiation to the upper extremities or to the  throat.  The pain was not increased with deep breathing.  He had no nausea,  vomiting and no diaphoresis.  He has had this chest pain on and off over a  48-hour period.  Sometimes he feels that the pain would clear if he just  burped and sometimes he gets up to walk and has cleared the pain, but it  always returns.  In fact, he does have chronic chest pain.  Usually it  occurs with exertion when he tries to walk to the mailbox.  However, this  chest pain over the past 48 hours has been more intense and more prolonged  than his usual level of pain.  He called the office at [facility]  this morning and was asked to come to the emergency room.  He took two  Protonix this morning and he  did have some alleviation of his pain.  He did  not take his morning aspirin.  He took aspirin when he got to the emergency  room.  He tried his nitroglycerin but upon opening the bottle, found that it  was in powder form.  He has not used this bottle for quite awhile.  In the  emergency room, his pain is 2/10.  Once again, he has had prior cardiac  catheterization and he is status post coronary artery bypass graft surgery.   ALLERGIES:  SEAFOOD which gives a rash.  He cannot recall if he had a  reaction to his dye study in 2001.   MEDICATIONS:  1.  Enteric-coated aspirin 81 mg daily.  2. Metoprolol 50 mg one-half tab b.i.d.  3. Protonix 40 mg daily.  4. Fish oil capsule daily.  5. Nitroglycerin 0.4 mg sublingually as needed.   PAST MEDICAL HISTORY:  1. Coronary artery disease, status post coronary artery bypass graft     surgery.  The patient had a 70-80% stenosis of the left main and the     diagonal had an 80% ostial stenosis.  Bypasses were placed from the LIMA     to the LAD and the left radial was placed from the aorta to the first     diagonal, then to the first obtuse marginal.  2. Adenosine Cardiolite August 2004.  Negative for ischemia, ejection     fraction of 41%.  3. Chronic chest pain.  Even after surgery he experienced exertional chest     burning.  4. Left bundle branch block.  5. Hypertension.  6. Peptic ulcer disease.  7. Gastroesophageal reflux disease/hiatal hernia.  8. Strong family history of coronary artery disease.  9. The patient denies any prior history of diabetes, cerebrovascular     accident, pulmonary embolism, or deep venous thrombosis, seizure, or GI     bleed.   SOCIAL HISTORY:  The patient lives in [place] with his wife of 40 years.  He  is retired from [employer] work.  He does chew tobacco but has not smoked since  his teen-age years.  He does not partake of alcoholic beverages or  recreational drugs.  He has two daughters, both of whom are alive  and well.  His mother's medical status is unclear.  Father died in his early 64's of  lung cancer.  He had two sisters and two brothers.  One brother died at age  74 of a myocardial infarction.  One sister died in her 22's of a myocardial  infarction and she was status post coronary artery bypass graft surgery.  His living brother and sister both have had bypass surgery with a sister  undergoing a redo coronary artery bypass.   REVIEW OF SYSTEMS:  GENERAL:  The patient is not having fevers, chills,  weight loss, sweats or adenopathy.  HEENT:  The patient is not experiencing  epistaxis, voice changes, photophobia.  INTEGUMENT:  No rashes, lesions.  CARDIOPULMONARY:  The patient has had chest pain, intermittent, for the last  48 hours, more persistent and more pronounced than previous chest pain.  He  is short of breath with exertion.  He does not have orthopnea, edema,  palpitations.  He does complain of fairly regular episodes of feeling dizzy-  headed.  GU:  No history of nocturia, dysuria or hematuria.  NEUROPSYCHIATRIC:  No anxiety or depression.  MUSCULOSKELETAL:  The patient  has degenerative joint disease with arthralgias in the left and right  shoulders and in the legs.  GI:  The patient has a hiatal hernia with GERD,  fairly persistent heartburn.  All other systems negative.   PHYSICAL EXAMINATION:  VITAL SIGNS:  Temperature 97.8, pulse 81,  respirations 20, blood pressure 175/88, oxygen saturation 97% on room air.  GENERAL:  The patient is alert and oriented x3.  HEENT:  Normocephalic, atraumatic.  Pupils are equal, round and reactive to  light.  Extraocular movements are intact.  Oropharynx shows the patient  wears upper and lower dentures with mucous membranes pink and moist without  lesions or erythema.  NECK:  Supple.  No carotid bruits auscultated.  No cervical lymphadenopathy.  HEART:  Regular rate and rhythm with persistent split S1, S2 without variance on respiration.   LUNGS:  Clear to auscultation and percussion bilaterally.  SKIN:  No rashes or lesions.  ABDOMEN:  Soft, nondistended.  Bowel sounds are present.  No  hepatosplenomegaly.  Abdominal aorta nonpulsatile.  GU:  Deferred.  RECTAL:  Deferred.  EXTREMITIES:  No evidence of clubbing, cyanosis, edema, lesions, or rashes.  MUSCULOSKELETAL:  No joint deformity or effusions.  NEUROLOGIC:  No focal neurologic deficits noted.  PERIPHERAL PULSES:  Radial pulses 4/4 only on the right.  The left radial  artery has been harvested.  Dorsalis pedis pulses 4/4 bilaterally.   LABORATORY DATA:  His electrocardiogram shows rate 73, sinus rhythm.  Axis  is -30 degrees.  Left bundle branch block.  PR interval is 121,   QRS is  146, QTC is 477.  CBC shows white cells 5.6, hemoglobin 13.9, hematocrit 41,  platelets 193.  Serum electrolytes:  Sodium 139, potassium 4, chloride 107,  carbonate 24, BUN 13, creatinine 1.1, glucose 103, SGOT 19, SGPT 24, alk  phos 60.  PTT/PT are pending.  Preliminary enzymes are less than 0.05 for  troponin-I in serial fashion.   ASSESSMENT:  1. Admitted with chest pain with atypical features.  First two enzymes are     negative.  Electrocardiogram shows no significant changes.  2. History of coronary artery disease with left main involvement, status     post coronary artery bypass graft surgery in 2001 with ejection fraction     55%.  3. Cardiolite August 2004 negative for ischemia.  Ejection fraction 41%.  4. Left bundle branch block.  5. Hypertension.  6. Question of dyslipidemia.  The patient is not currently on a statin but     has clearly defined coronary artery disease.  7. Very strong family history of premature coronary artery disease.  8. Chronic chest pain, persisted even right after coronary artery bypass     graft surgery.   PLAN:  The plan for Mr. Bonanno has been formulated by Dr. Antoine Poche after his  examination and questioning of the patient.  He felt that chest  pain was  atypical and sporadic, occurs frequently at rest.  Yesterday it was lasting  longer so he called the office and they told him to come to the emergency  room.  He denies any associated symptoms.  There is no objective evidence of  ischemia by laboratory studies or electrocardiogram.  The patient will be  admitted, enzymes cycled to rule out myocardial infarction.  If enzymes are  negative, he will have an adenosine Cardiolite study.  He will also be  placed on a statin.      Maple Mirza, P.A.                    Olga Millers, M.D. [place]    GM/MEDQ  D:  06/29/2003  T:  06/30/2003  Job:  [id number]

## 2010-06-10 NOTE — H&P (Signed)
[name] [name], [name] [name]   MEDICAL RECORD NO.:  [id number]          PATIENT TYPE:  INP   LOCATION:  3740                         FACILITY:  [facility]   PHYSICIAN:  Vida Roller, M.D.   DATE OF BIRTH:  [birth year]   DATE OF ADMISSION:  05/21/2004  DATE OF DISCHARGE:                                HISTORY & PHYSICAL   PRIMARY CARE PHYSICIAN:  Dr. Fayrene Fearing; [place], [place].   PRIMARY CARDIOLOGIST:  Olga Millers, M.D.   HISTORY OF PRESENT ILLNESS:  [name] [name] is a 70 year old man with known  ischemic cardiomyopathy, status post bypass surgery with severe three-vessel  coronary disease, who presents with atypical chest discomfort. He states he  has been having discomfort in his chest now for several months; has had two  episodes of syncope and went to see his cardiologist on the 26th of this  month.  He was felt at that time not to have significant ischemic disease.  e had a recent perfusion study done in February of this year which did not  show any significant ischemia, although this ejection fraction was mildly  depressed and he was advised to be treated medically.  This evening around 9  o'clock, he began to have discomfort at the center of his chest radiating to  his shoulders, up into his neck, associated with some shortness of breath,  no diaphoresis and no nausea. He states that this was different, as it has  never gone to his shoulders before; but otherwise, relatively similar  quality of the pain to what he has been dealing with for the last few  months. Denies any PND or orthopnea. He was admitted about a month ago for  syncope, but has not had any since.  He did have an episode where he was  physically ill about two days ago with nausea and vomiting.   PAST MEDICAL HISTORY:  Significant for coronary artery disease. He is status  post bypass surgery in 2001, where he had a LIMA to his LAD and sequential  free radial graft to his diagonal  and to his obtuse marginal. He has  undergone heart catheterization in June of this year, where he was found to  have severe three-vessel coronary disease with left main stenosis. His  ejection fraction was 40% at that time.  The LIMA to the LAD was widely  patent. The free radial graft, that touched down initially in the diagonal,  was widely patent; however, the second touched down to the obtuse marginal;  was not well seen and felt to be occluded. He has a chronic left bundle  branch block and ischemic cardiomyopathy, with ejection fraction of 40%. He  has gastroesophageal reflux disease, peptic ulcer disease with peptic  stricture in his esophagus.  There is a history of an abnormal CT scan, with  a lung nodule which is ongoing.  He has a history of hypertension and  dyslipidemia.  He has a history of cholecystectomy in the past, and there is  a question of whether he has sleep apnea.   ALLERGIES:  He  is allergic to SEAFOOD; he gets facial swelling.   MEDICATIONS CURRENTLY:  1.  Aspirin 81 mg a day.  2.  Zocor 20 mg a day.  3.  Protonix 40 mg takes 1-2 pills once a day.  4.  Zyrtec on as needed basis.  5.  Protonix 50 mg twice a day.  6.  Tricor 145 mg once a day.  7.  Enalapril 5 mg once a day.   SOCIAL HISTORY:  He is married.  He lives in [place], [place] with his wife. He  does not work. 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 He denies any tobacco or  alcohol use. He denies any illicit drug use.   FAMILY HISTORY:  His mother died at age 73 of advanced age. His father died  at 89 of lung cancer. He has four siblings, all of whom have very premature  coronary artery disease.   REVIEW OF SYSTEMS:  He denies any headaches, nausea, vomiting; any visual  changes or any changes in his voice.  He denies any pain in his ears or  changes in his hearing. No discharge from his nasal cavity.  Denies any  significant hoarseness in his voice. He does have the neck pain, as  previously described.  He  denies any significant problem with breathing or  productive cough. There is no shortness of breath. He denies any melena,  hematochezia, hemoptysis. He denies any abdominal pain. There is no  jaundice. He denies any musculoskeletal pain. No joint swelling. He denies  any neurologic pain or a numbness or tingling. He has had no recent weight  loss. No polyuria or  polydipsia.   PHYSICAL EXAMINATION:  GENERAL:  He is a well-developed, well-nourished  white male in no apparent distress, alert and oriented x4.  VITAL SIGNS:  Blood pressure 132/65,  heart rate 58, respirations 14. He is  afebrile.  HEENT:  Examination of the head, ears, eyes, nose and throat unremarkable.  NECK:  Supple. There is no jugular venous distension, carotid bruits or  thyromegaly.  CHEST:  Clear to auscultation bilaterally. There is a well-healed median  sternotomy scar, with good respiratory movement.  CARDIOVASCULAR:  His point of maximal impulse is not displaced.  There are  no lifts or thrills. His first and second heart sounds are normal. There are  no murmurs, rubs or gallops.  ABDOMEN:  Soft, nontender. Normoactive bowel sounds and no  hepatosplenomegaly.  EXTREMITIES:  Lower extremities without clubbing, cyanosis or edema. His  pulses were 2+ throughout. He has no bruits noted. NEUROLOGIC:  Nonfocal.  MUSCULOSKELETAL:  Nonfocal.  GU/RECTAL:  Deferred   LABORATORY:  His point of care enzymes are negative for acute myocardial  infarction.   CHEST X-RAY:  Pending.   ELECTROCARDIOGRAM:  Shows sinus rhythm at a rate of 59,  with normal P-R  interval; QRS duration 146 msec. QRS axis is normal at 46. He has a left  bundle branch block, which is old from an old EKG that we have.   ASSESSMENT:  This is a gentleman with known coronary artery disease,  atypical chest discomfort with abnormal EKG; but initial enzymes were negative. However, he suddenly had about 4-5 hours worth of pain.  Recent  heart  catheterization shows that he does have some occlusion to his grafts,  but otherwise his important bypass arteries are patent. He does have an  ischemic myopathy, chronic left bundle branch block, gastroesophageal reflux  disease; and, an abnormal CT scan of  his chest, with a question of lung  nodule. Hypertension and hyperlipidemia.   PLAN:  Admit and cycle his enzymes. Will get an adenosine Cardiolite in the  morning.  Increase his Protonix; I will treat him initially with  nitroglycerin, heparin and aspirin.  If  his enzymes are negative, then  those medications other than aspirin can be stopped.      JH/MEDQ  D:  05/21/2004  T:  05/21/2004  Job:  [id number]

## 2010-06-10 NOTE — Discharge Summary (Signed)
NAMEJAKEVIOUS, [name] NO.:  [id number]   MEDICAL RECORD NO.:  [id number]          PATIENT TYPE:  INP   LOCATION:  3740                         FACILITY:  [facility]   PHYSICIAN:  Vida Roller, M.D.   DATE OF BIRTH:  11-03-1940   DATE OF ADMISSION:  05/21/2004  DATE OF DISCHARGE:  05/22/2004                                 DISCHARGE SUMMARY   PROCEDURE:  Adenosine Myoview May 22, 2004.   REASON FOR ADMISSION:  Mr. Sustaita is a 70 year old male, with known severe  ischemic cardiomyopathy followed by Dr. Olga Millers, who presents to the  emergency room with atypical chest pain.  Please refer to admission note for  full details.   LABORATORY DATA:  Serial cardiac enzymes:  Normal.  Lipid profile:  Total  cholesterol 200, triglyceride 131, HDL 36, LDL 138, (cholesterol/HDL ratio  5.6), TSH 2.19.  Urinalysis negative.  Electrolytes, renal function remain  normal.  Liver enzymes normal on admission, repeat however, revealed  elevated total bilirubin of 1.9.  INR 0.9.  WBC 4.6, hemoglobin 12.9,  hematocrit 38, platelets 203,000 on admission.   Admission chest x-ray:  No acute changes.   HOSPITAL COURSE:  The patient was admitted for overnight observation and  rule out of myocardial infarction, with presenting symptoms felt to be  atypical for ischemia.  Protonix was thus up titrated to b.i.d. dosing on  admission.   Serial cardiac markers were all within normal limits.   The patient was cleared for an Adenosine Myoview, performed on the morning  of discharge, which revealed no stress-induced ischemia; mild septal scar  with hypokinesis; calculated ejection fraction 49%.   No further cardiac work up was recommended.   Medication adjustments this admission:  Up titration of Protonix.   DISCHARGE MEDICATIONS:  1.  Protonix 40 mg b.i.d.  2.  Coated aspirin 81 mg daily.  3.  Zocor 20 mg daily.  4.  Tricor 145 mg daily.  5.  Enalapril 5 mg daily.  6.   Metoprolol 25 mg b.i.d.  7.  Vytorin as previously directed.   INSTRUCTIONS:  1.  Arrange follow up with Dr. Burnell Blanks in the following one to two      weeks.  2.  The patient is instructed to arrange follow up with his primary care      physician, Dr. Burnell Blanks, in the ensuring one to two weeks.  3.  The patient is to follow up with Dr. Olga Millers, as previously      scheduled.   DISCHARGE DIAGNOSES:  1.  Atypical chest pain.      1.  Normal cardiac markers.      2.  Non-ischemic Adenosine Myoview; ejection fraction of 49%, May 22, 2004.  2.  Ischemic cardiomyopathy.      1.  Coronary artery bypass graft in 2001:  two of three grafts patent by          cardiac catheterization June of 2006.      2.  Ejection fraction of 40%.  3.  Chronic left bundle branch block.  4.  Gastroesophageal reflux disease/peptic ulcer disease.      1.  History of esophageal stricture.  5.  Dyslipidemia.  6.  Hypertension.  7.  Elevated total bilirubin.  8.  History of right upper lobe lung nodule, (tiny).      1.  Chest CT scan February of 2006.      GS/MEDQ  D:  05/22/2004  T:  05/22/2004  Job:  [id number]   cc:   Burnell Blanks, M.D.  [facility]

## 2010-06-10 NOTE — Assessment & Plan Note (Signed)
[facility]                            CARDIOLOGY OFFICE NOTE   NAME:[name] [name]                       MRN:          [id number]  DATE:03/20/2006                            DOB:          [birth year]    [name] [name] is a pleasant gentleman who has a history of coronary artery  disease status post coronary artery bypass grafting, as well as chronic  chest pain.  He was recently admitted to [facility] with  recurrent chest pain.  He ruled out for myocardial infarction with  serial enzymes.  He had a nuclear study on March 14, 2006 that showed  an ejection fraction of 44% and there was no ischemia noted.  Since  then, he continues to have chest pain.  He has had chest pain  intermittently for 30 years and it never changed following his bypass  surgery.  It can last from 5 minutes to all day at a time.  He describes  it as a heaviness.  It is not pleuritic or positional, nor is it related  to food.  It can happen with exertion or at rest.   MEDICATIONS:  1. Aspirin 81 mg p.o. daily.  2. Zocor 20 mg p.o. nightly.  3. Fish oil b.i.d.  4. Protonix 40 mg 1 to 2 p.o. daily.  5. Metoprolol 75 mg p.o. b.i.d.  6. TriCor 145 mg tablets 1/2 p.o. nightly.  7. Enalapril 5 mg p.o. b.i.d.   PHYSICAL EXAM:  Blood pressure of 170/78 and his pulse is 65.  NECK:  Supple.  CHEST:  Clear.  CARDIOVASCULAR:  Regular rate and rhythm.  EXTREMITIES:  No edema.   DIAGNOSES:  1. Coronary artery disease status post coronary artery bypass      grafting.  2. Chronic chest pain.  3. Left bundle branch block.  4. Gastroesophageal reflux disease/peptic ulcer disease.  5. Hypertension.  6. Hyperlipidemia.  7. History of seafood allergy causing facial swelling.   PLAN:  [name] continues to have chest pain that does not sound  cardiac.  He has had this for over 30 years.  His nuclear study showed  normal perfusion.  We will therefore continue with medical  therapy.  He  recently reinitiated his Zocor and we will have him return in 4 weeks  for fasting lipids and liver, and adjust his medications as indicated.  His blood pressure is elevated, and I have asked him to increase his  enalapril to 10 mg p.o. b.i.d.  We will check a BMET when he returns for  his blood work.  We can increase this further as needed and also  consider adding hydrochlorothiazide down the road.  He will need  followup carotid Dopplers in November.  Note, he did have a history of  an abnormal chest CT, but his most recent study performed on October 28, 2004 showed stable tiny right upper lobe nodules and no further  followup was recommended.  He will see [place] back in 6 months.     Madolyn Frieze Jens Som, MD, [facility]  Electronically Signed  BSC/MedQ  DD: 03/20/2006  DT: 03/20/2006  Job #: [id number]   cc:   Burnell Blanks, MD

## 2010-06-10 NOTE — Discharge Summary (Signed)
[place]. [facility]  Patient:    [name] [name], [name] [name] Visit Number: [id number] MRN: [id number]          Service Type: MED Location: 2000 2002 01 Attending Physician:  Glennon Hamilton Dictated by:   Pennelope Bracken, N.P. Admit Date:  05/14/2001                             Discharge Summary  NO DICTATION. Dictated by:   Pennelope Bracken, N.P. Attending Physician:  Glennon Hamilton DD:  05/15/01 TD:  05/15/01 Job: [id number]

## 2010-06-10 NOTE — Discharge Summary (Signed)
[name] [name], RORIE NO.:  [id number]   MEDICAL RECORD NO.:  [id number]          PATIENT TYPE:  INP   LOCATION:  4731                         FACILITY:  [facility]   PHYSICIAN:  Duke Salvia, M.D.  DATE OF BIRTH:  11/21/1940   DATE OF ADMISSION:  10/21/2003  DATE OF DISCHARGE:  10/22/2003                                 DISCHARGE SUMMARY   PROCEDURE:  Adenosine Cardiolite.   HISTORY OF PRESENT ILLNESS:  The patient is a 70 year old male with ischemic  heart disease, who had bypass surgery in 2001 and who was last catheterized  in June of 2005.  At that time, the SVG to diagonal and then to the OM was  totaled between the diagonal and the OM.  His EF was 40%.  He had nocturnal  recumbent chest pain accompanied by shortness of breath which on the day of  admission was more severe than normal.  He was admitted for further  evaluation and treatment.   HOSPITAL COURSE:  The patient's enzymes were negative for MI and a  Cardiolite was scheduled and performed on October 22, 2003.  The  Cardiolite showed an apical septal scar which was seen on the Cardiolite  done in 2005 and showed no ischemia.  His EF was 52%.  Dr. Tenny Craw felt his  symptoms were atypical and possibly GI in origin.  She felt that he should  follow up with GI as well as cardiology.   The patient was ambulating without chest pain or shortness of breath.  A BNP  had been checked and was minimally elevated at 128, so he received one dose  of IV Lasix and then is to go back on his home medications.  The patient's  O2 saturation was 95 to 96% on room air.  He has a history of dyspnea on  exertion and needs to follow up with his primary care physician for this.  He also has symptoms suspicious for obstructive sleep apnea and may well  need a sleep study or pulmonary consult.   As part of his evaluation, the patient had a lipid profile performed and it  showed a total cholesterol of 186, triglycerides 163,  HDL 33, LDL 120.  It  was felt that he needed better lipid control and for cost savings, he was  changed from Zocor 20 mg to Lipitor 80 mg 1/2 tablet daily.  He is to follow  up with cardiology and with his primary care physician and will need a  repeat lipid profile in about three months.   The patient was ambulating without chest pain or shortness of breath and was  considered stable for discharge on October 22, 2003, p.m.   CONDITION ON DISCHARGE:  Stable.   DISCHARGE DIAGNOSES:  1.  Chest pain, enzymes negative for myocardial infarction and Cardiolite      negative for ischemia.  2.  History of gastroesophageal reflux disease.  3.  Peptic ulcer disease and hiatal hernia as well as esophageal stricture,      status post dilatation.  4.  Status post bypass surgery  in 2001 with left internal mammary artery to      left anterior descending and sequential radial artery to the diagonal      and to the obtuse marginal.  5.  Status post catheterization in June of 2005 with patent left internal      mammary artery to left anterior descending and patent radial artery to      diagonal with the limb to the obtuse marginal occluded.  6.  Hypertension.  7.  Tachypalpitations.  8.  Dyspnea on exertion.  9.  Hyperlipidemia.  10. Possible obstructive sleep apnea.  11. Family history of coronary artery disease.  12. History of left bundle branch block.  13. Allergy to shellfish with facial swelling.  14. Status post cholecystectomy.   DISCHARGE INSTRUCTIONS:  1.  His activity level is to be as tolerated.  2.  He is to stick to a low fat diet.  3.  He is to follow up with Wilhemina Bonito. Marina Goodell, M.D. [facility] on Wednesday, October 5,      at 3:15 p.m.  4.  He is to follow up with Dr. Jens Som and the office will call.  5.  He is to follow up with his primary care physician as he may need a      sleep study.   DISCHARGE MEDICATIONS:  1.  Metoprolol 25 mg t.i.d.  2.  Aspirin 81 mg daily.  3.   Protonix 40 mg increase to b.i.d. until seen by Dr. Marina Goodell.  4.  Tricor 145 mg daily.  5.  Lipitor 80 mg 1/2 tablet daily  6.  Tricor 145 mg daily.  7.  Lisinopril 5 mg daily.       RB/MEDQ  D:  10/22/2003  T:  10/23/2003  Job:  [id number]   cc:   Olga Millers, M.D. [facility]   Titus Dubin. Alwyn Ren, M.D. [facility]   Donny Pique, M.D.

## 2010-06-10 NOTE — Discharge Summary (Signed)
NAMEMarland Kitchen  [name], TRUDO NO.:  [id number]   MEDICAL RECORD NO.:  [id number]                   PATIENT TYPE:  INP   LOCATION:  7829                                 FACILITY:  [facility]   PHYSICIAN:  [place] Bing, M.D.               DATE OF BIRTH:  10/20/1940   DATE OF ADMISSION:  09/15/2002  DATE OF DISCHARGE:  09/15/2002                                 DISCHARGE SUMMARY   BRIEF HISTORY:  [name] [name] is a 70 year old male with known coronary artery  disease and chronic chest discomfort.  He complained of moderate chest  pressure and burning that began in the late afternoon that waxed and waned  and failed to resolve.  He did not have any shortness of breath,  diaphoresis, nausea or vomiting although he says sometimes it takes my  breath away.  Is not relieved with nitroglycerin and/or rest.  His history  is notable also for GERD, hiatal hernia, possible esophageal stricture with  dilatation, cholecystectomy, coronary artery disease, chewing tobacco.  Please see History and Physical by Creta Levin, M.D.   LABORATORY DATA:  H&H was 14/1 and 41.  Normal indices.  Platelets 199, WBCs  5.3.  PT 12, PTT 28.  Sodium 138, potassium 3.7, BUN 19, creatinine 1,  glucose 124.  Normal LFTs.  CK total, MB's negative x1, ER markers negative  x3.  Lipids are pending at the time of this dictation.   HOSPITAL COURSE:  [name] [name] was admitted to the hospital and seen on the  morning of admission by Loura Pardon.  An adenosine Cardiolite was arranged  and performed by Gene Serpe, P.A.  Imaging showed no ischemia and an EF of  41%.  Thus Gene Serpe, P.A. discharged him home via the telephone speaking  to the nurse and the patient.   DISCHARGE DIAGNOSIS:  Chest discomfort of undetermined etiology, negative  adenosine Cardiolite.   DISPOSITION:  [name] [name] was instructed to continue his home medications.  These include baby aspirin 81 mg daily, Protonix 40 mg b.i.d.,  Lopressor 50  mg 1/2 tablet b.i.d., Zocor 40 mg q.h.s., Altace 2.5 daily, nitroglycerin  0.4 mg p.r.n..  Maintain low salt, low fat, no caffeine diet.  He will see  Olga Millers, M.D. on September 9th at 2 p.m.  He was instructed to have a  BMP in one week.     Joellyn Rued, P.A. [facility]                    [place] Bing, M.D.    EW/MEDQ  D:  12/11/2002  T:  12/11/2002  Job:  [id number]

## 2010-06-10 NOTE — Discharge Summary (Signed)
NAMEGAYLEN, VENNING NO.:  [id number]   MEDICAL RECORD NO.:  [id number]          PATIENT TYPE:  INP   LOCATION:  6529                         FACILITY:  [facility]   PHYSICIAN:  Salvadore Farber, M.D. LHCDATE OF BIRTH:  [birth year]   DATE OF ADMISSION:  03/09/2004  DATE OF DISCHARGE:  03/10/2004                                 DISCHARGE SUMMARY   PROCEDURES:  None.   HOSPITAL COURSE:  Mr. Paolo is a 70 year old male with known coronary  artery disease.  He had bypass surgery in 2001, and his last admission for  chest pain was in September 2005 when he had a Cardiolite with no ischemia  and an EF of 52%.  He has had a catheterization since his bypass which  showed an occluded limb of the sequential radial graft to the OM.  He was  admitted on March 09, 2004, for chest pain worse than his usual and up to  an 8/10.   Mr. Longbottom's chest pain resolved.  He had a chest CT done which showed no  aortic dissection.  His cardiac enzymes were negative for MI.  Dr. Samule Ohm  evaluated him on March 10, 2004, and considered him stable for discharge  with outpatient stress test to follow up arranged.   DISCHARGE DIAGNOSES:  1.  Chest pain, enzymes negative for myocardial infarction with Cardiolite      to assess for ischemia.  2.  Status post aortic coronary bypass surgery in 2001 with left internal      mammary artery to left anterior descending and a radial to diagonal and      2 obtuse marginal.  3.  Status post cardiac catheterization in June 2005, with the limb of the      radial graft from the diagonal to the obtuse marginal occluded.  4.  Preserved left ventricular function with an ejection fraction of 52% by      Cardiolite.  5.  Hiatal hernia.  6.  Hypertension.  7.  Dyslipidemia.  8.  Status post cholecystectomy.  9.  Family history of coronary artery disease in a sibling.   DISCHARGE INSTRUCTIONS:  1.  His activity level is to include no strenuous activity  until after the      stress test.  2.  He is to stick to a low fat diet.  3.  He is to see the PA for Dr. Jens Som on February 23 at noon and Dr.      Nathanial Rancher as scheduled.  4.  He is to have an adenosine Cardiolite on February 28 at 1:30.   DISCHARGE MEDICATIONS:  1.  Fish oil as prior to admission.  2.  Metoprolol 25 mg t.i.d.  3.  Aspirin 81 mg daily.  4.  Protonix 40 mg b.i.d.  5.  Tricor 145 mg daily.  6.  Lipitor 40 mg daily.  7.  Lisinopril 5 mg daily.      RB/MEDQ  D:  03/10/2004  T:  03/10/2004  Job:  [id number]   cc:   Burnell Blanks, MD   Dr. Marina Goodell  Olga Millers, M.D. [facility]

## 2010-06-10 NOTE — Discharge Summary (Signed)
[place]. [facility]  Patient:    [name] [name], [name] [name] Visit Number: [id number] MRN: [id number]          Service Type: MED Location: 2000 2002 01 Attending Physician:  Glennon Hamilton Dictated by:   Pennelope Bracken, N.P. Admit Date:  05/14/2001 Discharge Date: 05/15/2001   CC:         Andres Ege, M.D., [facility], [place]   Discharge Summary  DATE OF BIRTH:  [birth year]  CARDIOLOGIST:  Madolyn Frieze. Jens Som, M.D.  PRIMARY CARE PHYSICIAN:  Andres Ege, M.D.  REASON FOR ADMISSION:  Transfer from [facility] for chest pain with EKG changes.  HISTORY OF PRESENT ILLNESS:  This delightful 70 year old fellow has a history of coronary artery disease.  He was status post CABG in the fall of 2001 with LIMA to LAD, left radial artery graft to diagonal 1 and obtuse marginal 1. Since then, he has continued to have chest pain.  He does admit that he has had pain in his chest since the age of 56 that increases with exertion and that the chest pain had not increased subsequent to his surgery.  He does, however, admit that prior to admission, the pain has escalated and, indeed, he was awakened from sleep at 2 in the morning with anterior chest burning and aching with radiation to the bilateral arm and neck area and associated shortness of breath.  Given his history, it was deemed prudent to admit him for further evaluation and treatment.  HOSPITAL COURSE:  The patient was admitted in stable condition.  His chest pain persisted at a level of 2/10.  Serial labs were drawn, and cardiac enzymes were negative.  The only lab abnormality found in fact was an elevated total bilirubin only slightly high at 1.4.  The patient was scheduled for an adenosine Cardiolite having had one last year that was negative for ischemia. He tolerated the procedure well, and it was found to be negative for ischemia, and his ejection fraction was estimated at 49%.  The nuclear images  were reviewed by Dr. Jens Som and found to be relatively unchanged from last years study.  After discussion with Dr. Corinda Gubler, the patient was found to be a good candidate for discharge, and a low-dose nitrate was added to his medication regimen.  PHYSICAL EXAMINATION ON DISCHARGE:  The patient offered no complaints of new chest pain.  He did have 2/10 chronic pressing chest discomfort, but this is his norm.  VITAL SIGNS:  Blood pressure 139/77, pulse 66, respirations 20, temperature afebrile.  Telemetry reveals normal sinus rhythm, left bundle-branch block, rate of 64.  Nondiagnostic ST morphology in lateral leads.  GENERAL:  The patient is alert and oriented and in no acute distress.  NECK:  No JVD or bruit.  LUNGS:  Clear to auscultation bilaterally.  CARDIOVASCULAR:  Regular rate and rhythm, S1, S2, and S4.  No obvious murmur.  EXTREMITIES:  Without clubbing, cyanosis, or edema.  LABORATORY DATA:  Discharge hemogram as follows:  WBC 5.5, hemoglobin 14.1, hematocrit 40.5, platelets 197.  Chemistry:  Sodium 139, potassium 3.9, chloride 109, CO2 25, BUN 17, creatinine 0.9, glucose 94.  CK-MB and troponin I negative x 3 as above.  Lipid panel is pending.  Chest x-ray showed cardiomegaly, no acute disease.  DISPOSITION:  The patient is discharged to home in the care of his wife.  DISCHARGE MEDICATIONS: 1. Lopressor 12.5 mg 1 b.i.d. 2. Imdur 30 mg 1 q.d. 3. Zocor 20 mg 1 q  1800. 4. Protonix 40 mg 1 q.d. 5. Enteric-coated aspirin 1 q.d.  ACTIVITY:  The patient is encouraged to exercise with a gentle walk of 30 minutes a day.  DIET:  Recommended low fat, low cholesterol diet.  FOLLOW-UP:  The patient will follow up with Dr. Glennon Hamilton as scheduled.  He agrees to call for any increase in chest pain or new onset shortness of breath, and he agrees to call with any problems, questions, or concerns. Dictated by:   Pennelope Bracken, N.P. Attending Physician:  Glennon Hamilton DD:   05/15/01 TD:  05/15/01 Job: [id number]

## 2010-06-10 NOTE — Discharge Summary (Signed)
NAME:  [name] [name], IDEN NO.:  [id number]   MEDICAL RECORD NO.:  [id number]                   PATIENT TYPE:  INP   LOCATION:  3703                                 FACILITY:  [facility]   PHYSICIAN:  Rollene Rotunda, M.D.                DATE OF BIRTH:  21-Oct-1940   DATE OF ADMISSION:  DATE OF DISCHARGE:  07/01/2003                                 DISCHARGE SUMMARY   DISCHARGE DIAGNOSIS:  Admitted with exertional chest pain, progressing  beyond his chronic normal.   STUDIES:  1. Electrocardiogram with no significant changes on admission.  2. Cardiac enzymes this admission negative x3.  3. Adenosine Cardiolite on June 7.  Apical septal infarct.  No ischemia.     Ejection fraction 32%.  4. Declining ejection fractions at catheterization October of 2001, 55%.  5. Adenosine Cardiolite in August, 2004, 41%.  6. Adenosine Cardiolite in June of 2005, 32%.  7. Left-heart catheterization July 01, 2003.  Ejection fraction 40%.  Three-     vessel coronary artery disease.  The LIMA to the LAD was patent.  A     sequential graft using the left radial artery from the aorta to the     diagonal is patent, but from the diagonal to the obtuse marginal #1 is     occluded.  8. Adverse lipid profile.   SECONDARY DIAGNOSES:  1. History of coronary artery disease status with left main involvement,     status post coronary artery bypass graft surgery in 2001 with ejection     fraction of 55%.  2. Adenosine Cardiolite study in August of 2004 negative for ischemia.     Ejection fraction of 41%.  3. Chronic chest pain even after bypass surgery in 2001.  4. Left bundle branch block.  5. Hypertension.  6. Peptic ulcer disease.  7. Gastroesophageal reflux disease/hiatal hernia.  8. Strong family history of coronary artery disease.   PROCEDURES:  1. June 30, 2003, adenosine Cardiolite study showing apicoseptal infarct,     negative for ischemia, ejection fraction of 32%.  2.  Left-heart catheterization July 01, 2003, ejection fraction 40% with     finding of three-vessel coronary artery disease.  Graft from the LIMA to     the LAD is patent.  A sequential left radial artery graft from the aorta     to the diagonal is patent, but occluded from the diagonal to the first     obtuse marginal.   DISCHARGE DISPOSITION:  Mr. Rollo Farquhar is ready for discharge June 8 after  undergoing successful left-heart catheterization.  He will be managed  medically.  At the time of admission, his metoprolol was increased from 25  mg b.i.d. to 25 mg t.i.d.  He has been added lisinopril 5 mg daily, also for  adverse lipids including high triglycerides and low HDL.  Tricor 145 mg  daily.  He  has also been started on Zocor 20 mg daily at bedtime.  The  catheterization site at the right lower extremity shows no evidence of  compromise to the leg.  There is no evidence of swelling, distension,  drainage or pain.  The patient has been afebrile, alert and oriented.  He  has had no chest pain during his hospitalization.   DISCHARGE MEDICATIONS:  1. Enteric-coated aspirin 81 mg daily.  2. Metoprolol 50 mg, one p.o. t.i.d.  3. Protonix 40 mg daily.  4. Tricor 145 mg daily.  5. Lisinopril 5 mg daily.  6. Zocor 20 mg daily at bedtime.  7. Fish oil capsules as taken before this hospitalization.  8. Nitroglycerin 0.4 mg, one tablet under the tongue every five minutes x3     doses as needed for chest pain.  9. For pain at the catheterization site, Tylenol 325 mg, one to two tablets     every four to six hours p.r.n.   DISCHARGE ACTIVITY:  1. Avoid heavy lifting and straining for the next two weeks.  2. He may drive beginning Saturday, June 11.  3. He may shower.  4. He is to call [phone] if he experiences swelling or increased pain at     the catheterized site.   DISCHARGE DIET:  A low sodium, low cholesterol diet.   FOLLOWUP:  He has an office visit with Dr. Olga Millers at  [facility], [address] An echocardiogram will be taken at 9:30 in  the morning on Thursday, June 23, and he will see the doctor at 10:30 in the  morning on Thursday, June 23.   BRIEF HISTORY:  [name] is a 70 year old male.  He gives a history of  left main disease and is status post coronary artery bypass graft surgery in  2001.  At that time, grafts were placed from LIMA to the LAD and from the  left radial artery sequentially to the first diagonal and to the first  obtuse marginal.  The patient also has a history of left bundle branch block  and hypertension.  He has unknown lipid status at present. He also has a  hiatal hernia and gastroesophageal reflux disease and a very-strong family  history of coronary artery disease.   On the morning of June 4, he had chest pain at rest which was 8/10 in  intensity.  There was no radiation  to the upper extremities or to the  throat.  The pain was not increased with deep breathing.  He had no nausea,  vomiting or diaphoresis.  The chest pain continued on an off over a 48-hour  period.  Sometimes he feels that the pain would clear if he just burped.  Sometimes he gets up to walk, and this has cleared the pain, but it always  returns.  In addition, he does have an element of chronic chest pain.  This  usually occurs with exertion when he tries to walk to the mailbox.  However,  the chest pain over the last 48 hours has been more tense and more prolonged  than the usual level.  He called the office at [facility] the  morning of June 6, and he was asked to come to the emergency room.  He took  two Protonix prior to coming and did have some alleviation to his pain.  He  did not take his morning aspirin, but got it at the emergency room.  He  tried nitroglycerin,  but upon opening his bottle, he found that it had  expired.  It was a powder.   In the emergency room, his pain is 2/10.  PLAN:  Admission to cycle  cardiac enzymes, to perform an adenosine  Cardiolite stress study.  If the study is positive, he will undergo left  heart catheterization.   HOSPITAL COURSE:  After admission to [facility] through the  emergency room on June 6, with incidence of intense pain at rest and pain  persisting which is in addition to his usual chronic exertional chest pain,  the patient was placed on IV nitroglycerin at 3 cc an hour.  His Lopressor  was increased from 25 mg b.i.d. to 25 mg t.i.d.  He was started on Zocor.  He was scheduled for an adenosine Cardiolite.  The study was negative for  ischemia.  However, the ejection fraction was diminished from a prior study  one year ago at 32%.  With this in mind, and ejection fractions had seemed  to be declining since his original catheterization in 2001, the patient was  scheduled for a left-heart catheterization.  This was performed on June 8  with the study as dictated above.  A new finding is occlusion of the  sequential artery from the aorta to the diagonal and then to the obtuse  marginal. It was occluded after its anastomosis with the diagonal.   The patient will be started on p.o. lisinopril as well as his other  medications.  Prior to catheterization, he received IV dye prophylaxis since  the patient is allergic to seafood.  The patient discharges after successful  recovery from left-heart catheterization June 8, with the medications and  followup as dictated.      Maple Mirza, P.A.                    Rollene Rotunda, M.D.    GM/MEDQ  D:  07/01/2003  T:  07/02/2003  Job:  [id number]   cc:   Olga Millers, M.D. [facility]   Kizzie Furnish, M.D.  [address]  [place]  [place] [address]  Fax: [phone]
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
## 2010-06-10 NOTE — Discharge Summary (Signed)
[name] [name] NO.:  [id number]   MEDICAL RECORD NO.:  [id number]          PATIENT TYPE:  INP   LOCATION:  2036                         FACILITY:  [facility]   PHYSICIAN:  Salvadore Farber, MD  DATE OF BIRTH:  1940/03/12   DATE OF ADMISSION:  03/12/2006  DATE OF DISCHARGE:  03/13/2006                               DISCHARGE SUMMARY   PROCEDURE:  None.   DISCHARGE DIAGNOSIS:  Chest pain, cardiac enzymes negative for  myocardial infarction and outpatient Myoview scheduled.   SECONDARY DIAGNOSES:  1. Status post aortocoronary bypass surgery in 2001 with left internal      mammary artery to left anterior descending and right internal      mammary artery to diagonal and obtuse marginal.  2. Status post cardiac catheterization in November of 2006 with a      Taxus stent to the left main into the circumflex, reducing that      stenosis from 80% to 0 and a bare metal stent in the right coronary      artery, reducing that stenosis from 90% to 0; left main, total,      left internal mammary artery to left anterior descending patent,      and a right internal mammary artery to diagonal patent, second limb      of the graft to the obtuse marginal totaled.  3. Hypertension.  4. Hyperlipidemia.  5. Chronic chest pain.  6. History of mild cerebrovascular disease.  7. History of left bundle branch block.  8. History of elevated bilirubin.  9. History of gastroesophageal reflux disease and peptic ulcer      disease.  10.History of her right upper lobe nodule that was stable by chest x-      ray in 2006 and not seen or commented on chest x-ray  in 2008.  11.Family history of coronary artery disease in four siblings.  12.Allergy or intolerance to shellfish.  13.Ongoing tobacco use with chewing tobacco.  14.Medical noncompliance secondary to financial issues.   TIME SPENT AT DISCHARGE:  42 minutes.   HOSPITAL COURSE:  [name] is a 70 year old male with known  coronary  artery disease.  He noted an increase in his chest pain and states his  symptoms are similar to his pre-stent pain.  He also has chest pain on a  constant and ongoing basis that has been there for the last 30 years.  Because his symptoms increased, he came to the emergency room on  March 11, 2006 and was discharged home after being seen by Dr.  Laurelyn [name].  He called the office because he was still having more chest  pain, and he came to the hospital where he was admitted for further  evaluation.   His cardiac enzymes were negative for MI.  His chest x-ray showed  cardiomegaly but no acute disease.  His BNP was 76.  His total  cholesterol was 265, triglycerides 162, HDL 31, LDL 202.  He has not  been on a statin secondary to financial issues.   His blood pressure was slightly elevated, and  he stated this had been  running high at home, so his metoprolol was increased from 50 to 75 mg  b.i.d.  He was evaluated by Dr. Samule [name] who felt that an outpatient  stress test was adequate for evaluation.  This is scheduled for March 13, 2006 at 1:00 p.m..  He also recommended followup with Dr. Jens [name]  which has been arranged.  [name] was considered stable for discharge  on March 13, 2006.   DISCHARGE INSTRUCTIONS:  1. He is to stick to a low-fat diet.  2. He is to follow up with Dr. Jens [name] on March 20, 2006 at 1:45      and with Dr. Nathanial [name] as needed.  3. He is to get a stress test on March 13, 2006 at 1:00 p.m.  He is      to be n.p.o. for that.   DISCHARGE MEDICATIONS:  1. Enalapril 5 mg b.i.d.  2. Aspirin 81 mg a day.  3. Protonix 40 mg a day.  4. Metoprolol 50 mg, one and a half tablets b.i.d.  5. Simvastatin 80 mg daily.  6. Plavix 75 mg a day.      [name] Demark, PA-C      Salvadore Farber, MD  Electronically Signed    RB/MEDQ  D:  03/13/2006  T:  03/13/2006  Job:  [id number]   cc:   [name] [name], M.D.

## 2010-06-10 NOTE — H&P (Signed)
[name] [name], [name] [name] NO.:  [id number]   MEDICAL RECORD NO.:  [id number]          PATIENT TYPE:  INP   LOCATION:  1824                         FACILITY:  [facility]   PHYSICIAN:  Jonelle Sidle, MD DATE OF BIRTH:  [birth year]   DATE OF ADMISSION:  03/12/2006  DATE OF DISCHARGE:                              HISTORY & PHYSICAL   ADMISSION HISTORY AND PHYSICAL   PRIMARY CARDIOLOGIST:  Dr. Olga Millers   REASON FOR ADMISSION:  Chest pain.   HISTORY OF PRESENT ILLNESS:  [name] [name] is a pleasant 70 year old male  with a history of hypertension, hyperlipidemia, medical noncompliance  secondary to finances, chronic left bundle branch block and known  coronary artery disease status post previous coronary artery bypass  grafting in 1997 with more recently placement of a Taxus stent in the  left main and bare metal stent in the right coronary artery in 2006.  He  has a long standing chronic history of recurrent chest pain, stating  that this has been a problem for at least 30 years, predating both his  coronary artery bypass grafting and percutaneous interventions and  reportedly not particularly different in quality or character following  these revascularization attempts.  He has also apparently undergone  prior gastrointestinal evaluation with endoscopy a few years ago.  He  was admitted to the hospital back in November 2007, at which time he  ruled out for myocardial infarction and was seen by Dr. Jens Som with  plans for a follow-up outpatient Myoview and office assessment.  The  patient reports that this evaluation was reassuring, although I cannot  locate any specific office notes or Myoview report at this time for  confirmation.  In any event he reports a 2 week history of worsening  intermittent chest pressure, predominantly more prolonged but of the  same quality and at times worse around meals.  He was actually seen in  the emergency department last night by  Dr. Laurelyn Sickle and was discharged  home with planned follow-up over the next 24-48 hours as an outpatient  in clinic.  It was felt that his symptoms were potentially  gastrointestinal and the patient does admit that he typically does not  chew his food with dentures.  He experienced recurrent symptoms today  when he went outside to feed his dogs on two separate occasions and  presented back to the emergency department.  He states that he is  feeling better at this time and has received no specific treatment.  He  has all blood work pending as well as chest x-ray, although his  electrocardiogram shows a stable left bundle branch block pattern.   ALLERGIES:  SHELLFISH.   MEDICATIONS:  Medications at home reportedly include:  Aspirin 81 mg p.o. daily.  Protonix 40 mg p.o. daily.  Metoprolol 50 mg p.o. b.i.d.  Plavix 75 mg p.o. daily.  Enalapril 5 mg p.o. b.i.d.   The patient admits that he does not take this regimen uninterrupted.   PAST MEDICAL HISTORY:  Has been reviewed previously.   SOCIAL HISTORY:  Has been reviewed previously.  FAMILY HISTORY:  Has been reviewed previously.   The History and Physical from November 2007 was reviewed and placed in  the chart.  There has been no other reported major interval change.   REVIEW OF SYSTEMS:  As outlined in History of Present Illness.  He has  had cervical discomfort, back and arm pain which seems chronic in  description.  Reports a history of hiatal hernia.  Denies any active  bleeding problems.  No melena or hematochezia.   EXAMINATION:  Temperature is 98.2 degrees, blood pressure 145/85, heart  rate 80, respirations 18, oxygen saturation is 94% on room air.  This is  a pleasant, elderly male in no acute distress.  HEENT:  Conjunctivae  normal.  Pharynx is clear.  Neck is supple without elevated jugular  venous pressure or loud bruits.  No thyromegaly is noted.  Lungs are  clear without labored breathing at rest.  Cardiac exam  reveals a regular  rate and rhythm without loud murmur, pericardial rub or S3 gallop.  The  abdomen is soft, nontender, no bruits.  Bowel sounds present.  Extremities show no pitting edema.  Skin is warm and dry.  Distal pulses  1-2 plus.  Musculoskeletal:  No kyphosis is noted.  Neuro-psychiatric:  The patient is alert and oriented times three.   LABORATORY DATA:  From February 17 showed a WBC of 4.7, hemoglobin 13.2,  platelets 187, INR 1, sodium 138, potassium 4, chloride 104, bicarb 28,  glucose 97, BUN 11, creatinine 1, CK MB less than 1, troponin-I less  than 0.05, myoglobin 56.8, no chest x-ray obtained.   IMPRESSION:  1. Chest pain syndrome as discussed above, largely chronic in nature      and potentially non-cardiac and perhaps gastrointestinal in      etiology.  He does describe worsening with more prolonged symptoms      over the last 2 weeks and was in the emergency department twice      within the last 24 hours.  His electrocardiogram shows a stable      left bundle branch block pattern.  All other blood work is pending      as is chest x-ray.  2. Known history of coronary artery disease status post coronary      artery bypass grafting in 1997 with subsequent Taxus stent      placement to the left anterior descending and bare metal stent      placement to the right coronary artery in 2006.  3. Hypertension.  4. Hyperlipidemia.   PLAN:  I discussed the situation with the patient and his family.  I  have recommended admission to the hospital for observation.  We will  obtain a full set of cardiac markers, adding Lovenox if these markers  are abnormal.  Otherwise we will continue his outpatient medical  regimen.  We also need the formal report of his Myoview and follow-up  office note if available.  If his cardiac markers are reassuring, it may  be that he needs a follow-up gastrointestinal evaluation.  He reports an endoscopy a few years ago but has had no more recent  assessment.  Otherwise cardiac assessment can be considered depending on his clinical  status.      Jonelle Sidle, MD  Electronically Signed    SGM/MEDQ  D:  03/12/2006  T:  03/12/2006  Job:  [id number]   cc:   Madolyn Frieze. Jens Som, MD, [facility]
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
## 2010-06-10 NOTE — H&P (Signed)
NAMETEJUAN, GHOLSON NO.:  [id number]   MEDICAL RECORD NO.:  [id number]          PATIENT TYPE:  INP   LOCATION:  4731                         FACILITY:  [facility]   PHYSICIAN:  Duke Salvia, M.D.  DATE OF BIRTH:  February 16, 1940   DATE OF ADMISSION:  10/21/2003  DATE OF DISCHARGE:                                HISTORY & PHYSICAL   HISTORY OF PRESENT ILLNESS:  [name] [name] is a 70 year old gentleman with  known ischemic heart disease who presents to the emergency room with  shortness of breath and chest pain.   [name] [name] underwent bypass surgery in 2001 for left main disease.  He  received a LIMA to his LAD and a sequential radial artery conduit to his  diagonal and to his OM.  At that point he had normal left ventricular  function.  He notes that his chest pain syndrome did not change appreciably  after his bypass.  Because of recurrent chest pain associated with shortness  of breath, he underwent catheterization in June 2005 which demonstrated that  the conduit limb between his diagonal and his OM had occluded.  This was  done in the context of a Cardiolite scan that had demonstrated deterioration  of his ejection fraction down to about 40%.  Since being told that he had  recurrent obstruction the patient has had significantly high levels of  concern regarding his coronary disease.   He presented to the emergency room this morning because he had recurrence of  his nocturnal recumbent chest pain accompanied by shortness of breath.  These are frequent occurrences.  What happened this morning was that they  were both more severe than normal but no different qualitatively as best as  I can tell from normal.  His recumbent chest pain is frequently relieved by  eructation.  He has known peptic ulcer disease and known GE reflux disease  in the context of a hiatal hernia.   He also has exercise limitations that are quite significant.  He is short of  breath at 100-150  yards or so on level, and certainly much faster than that  if there is any incline at all.  His exercise intolerance is frequently but  not always accompanied by this chest pressure.   The patient denies peripheral edema.  He does sleep on pillows.   The patient also has a history of tachypalpitations, primarily with  exertion.  He has no known history of atrial fibrillation and no prior  strokes.   PAST MEDICAL HISTORY:  Notable primarily as listed above.   MEDICATIONS:  1.  Metoprolol 25 t.i.d.  2.  Aspirin 81.  3.  Protonix 40.  4.  Tricor 145.  5.  Lisinopril 5.  6.  Zocor 20.   ALLERGIES:  He has allergy to [facility] which is associated with facial  edema.   REVIEW OF SYSTEMS:  Notable for obstructive sleep patterns, according to his  wife, and headaches.  Is otherwise listed on the intake sheet from Dr.  Ulyess Mort.   PHYSICAL EXAMINATION:  GENERAL:  [name] [name] is an  elderly Caucasian male  appearing somewhat older than his stated age of 70.  VITAL SIGNS:  His blood pressure is 125/80, his pulse was 60 and regular,  respirations were 20 and unlabored.  He was in no acute distress.  HEENT:  Demonstrated no icterus or xanthomata.  The neck veins were flat.  The carotids were brisk and full bilaterally without bruits.  BACK:  Without kyphosis or scoliosis.  LUNGS:  Clear.  HEART:  Heart sounds were regular with an S4.  ABDOMEN:  Soft with active bowel sounds without midline pulsation or  hepatomegaly.  EXTREMITIES:  Femoral pulses were 2+, distal pulses were intact.  There was  no clubbing, cyanosis, or edema.  NEUROLOGIC:  Grossly normal.  SKIN:  Warm and dry.   Electrocardiogram dated this morning demonstrated sinus rhythm at 60 with  intervals of 0.13/0.15/0.45 with an axis that was leftward of -50 degrees,  with a pattern consistent with left bundle-branch block.  His point-of-care  cardiac enzymes were normal.  His other labs were not notable.   IMPRESSION:   1.  Recurrent primarily recumbent chest pain, relived by eructation and      associated with shortness of breath.  2.  History of hiatal hernia and peptic ulcer disease.  3.  Coronary artery disease.      1.  Status post coronary artery bypass grafting.      2.  Repeat catheterization in June 2005 demonstrating a patent left          internal mammary artery, a patent radial to his diagonal, with an          obstructed limb to his obtuse marginal.      3.  Intercurrent deterioration of his ejection fraction to 40%.      4.  Chronic chest pain.  4.  Class II-III congestive failure.  5.  Left bundle-branch block.  6.  Obstructive sleep apnea symptoms.  7.  History of tachypalpitations.  8.  SHELLFISH allergy.   DISCUSSION:  [name] [name] has recurrent recumbent chest pain associated with  shortness of breath.  The fact that it is relieved by eructation and is more  prominent recumbent than with exertion suggests that it may well be GI in  origin, and some of his shortness of breath may be related to recurrent  microaspiration.  To that end, we will have the gastroenterologist see him  and possible further assistance from pulmonary.   However, we still know that he has progressive obstructive coronary disease.  He had a Cardiolite scan done earlier this year that was abnormal, primarily  with change in ejection fraction.  To that end, we will plan to repeat a  Cardiolite scan, probably done in the office so we can compare the two  studies.   His exercise intolerance is also striking.  I suspect that there is a  component of lung disease here, although this has not been defined  heretofore and so we will presume that it is related to congestive heart  failure currently, and push medical management of that.   PLAN:  Based on the above, therefore:  1.  Undertake a GI evaluation for GE reflux disease with possible associated      aspiration. 2.  Cardiolite to exclude progressive obstructive  disease - probably will do      in the office.  3.  Diuretics and aggressive treatment for his heart failure.  4.  Consider Lanoxin.  5.  Check his  BNP.  6.  Smoking cessation.  7.  Outpatient sleep study.       SCK/MEDQ  D:  10/21/2003  T:  10/21/2003  Job:  [id number]   cc:   Dr. Fayrene Fearing in [place]

## 2010-06-10 NOTE — H&P (Signed)
NAMEMarland Kitchen  [name] [name], [name] [name] NO.:  [id number]   MEDICAL RECORD NO.:  [id number]                   PATIENT TYPE:  OUT   LOCATION:  NUC                                  FACILITY:  [facility]   PHYSICIAN:  Creta Levin, M.D. [facility]      DATE OF BIRTH:  29-Jul-1940   DATE OF ADMISSION:  09/15/2002  DATE OF DISCHARGE:                                HISTORY & PHYSICAL   PRIMARY CARDIOLOGIST:  Cecil Cranker, M.D.   PRIMARY CARE PHYSICIAN:  Kizzie Furnish, M.D.   CHIEF COMPLAINT:  Chest pain.   HISTORY OF PRESENT ILLNESS:  [name] [name] is a 70 year old gentleman with  coronary artery disease and a hiatal hernia who presents with chest  discomfort.  He states that he has chest discomfort on an almost daily  basis.  Usually, this occurs with exertion and is described as a pressure or  burning.  The afternoon of September 14, 2002, he began to have moderate chest  pressure and burning at rest.  These symptoms waxed and waned, but did not  resolve.  He had not improvement with complete rest nor with nitroglycerin.  He did not have any shortness of breath, but states that it took my breath  away.  He did not have diaphoresis, nausea, vomiting, or symptoms of  radiation to his extremities.  He presented to the emergency department  where he was given nitroglycerin and had no change in his symptoms but they  are now of mild intensity.   PAST MEDICAL HISTORY:  1. Coronary artery disease status post CABG with a LIMA to the LAD and a     radial artery graft to a diagonal.  2. Gastroesophageal reflux/hiatal hernia/possible esophageal strictures.  3. Cholecystectomy.   SOCIAL HISTORY:  He lives in [place], [place] with his wife.  He is a retired  Freight forwarder.  He does not smoke tobacco, but does chew.  He does not  drink alcohol.   ALLERGIES:  He has no known drug allergies, but states that he is  significantly allergic to shellfish.   MEDICATIONS:  1. Aspirin 81 mg  q.d.  2. Protonix 40 mg q.d. occasionally b.i.d.   FAMILY HISTORY:  Significant for coronary disease in multiple siblings, the  youngest of which was a brother who died at age 61 of an MI.   REVIEW OF SYSTEMS:  Positive for occasional headache and arthralgias in the  neck, back, and left lower extremity.  His symptoms of left lower extremity  pain are worse at rest and improved with walking.  Other positive symptoms  are chest discomfort with approximately 100-200 feet of walking.  This is  stable and has been since prior to his bypass surgery.  He has had no recent  presyncope or syncope, but states occasionally he feels dizzy and  presyncopal.  He has no symptoms of claudication.  His advanced directive:  He is a full  code.   PHYSICAL EXAMINATION:  VITAL SIGNS:  Temperature 97.0, blood pressure  161/77, pulse 78, his is saturating at 99% on room air.  GENERAL:  He is in no acute distress.  HEENT:  He is edentulous; otherwise, nothing abnormal.  NECK:  Reveal normal jugular venous wave forms and no bruits.  CARDIOVASCULAR:  Revealed a regular rate and rhythm without gallop, murmur,  or rub.  LUNGS:  Clear.  ABDOMEN:  Soft, nondistended, nontender with normal bowel sounds.  I could  not reproduce his symptoms with palpations of his epigastrium.  EXTREMITIES:  Revealed no cyanosis, clubbing, or edema.  He did seem to have  a pruritic area in the left mid-thigh that was tender to palpation, but did  not seem terribly concerned.  NEUROLOGIC:  Nonfocal.   LABORATORY AND ACCESSORY DATA:  Chest x-ray is pending.  ECG revealed normal  sinus rhythm at a rate of 70 with a left bundle branch block.  His labs are  pending.  So far troponin I was negative.   ASSESSMENT/PLAN:  Chest pain:  This is tough to discern.  The patient  complains of chronic stable chest discomfort with exertion.  The chest  discomfort, he feels, is usually a pressure and a burning sensation.  There  is no true  dyspnea on exertion, but he states that he feels like it takes  his breath away.  The only difference between his pain today from his chest  pain in the past is that this occurred at rest and did not resolve.  The  patient does not seem particularly concerned and seems to have familiarity  with this type of chest pain.  I have continued aspirin at 81 mg per day and  have started him on metoprolol and Zocor.  I will also start him on a low-  dose ACE inhibitor.  For risk stratification, I will schedule him for an  adenosine Cardiolite in the morning.  My suspicion is that he will have no  overwhelming ischemia.  For now, however, I will treat him with Lovenox  while ruling him out for MI.   To further assess his chest discomfort, it may be reasonable to get a GI  consult.  His wife describes what sounds like esophageal strictures and  dilatation.  I do not have any records to see what exactly has been done.  In the meantime, I will increase his Protonix to 80 mg q.12h.                                                Creta Levin, M.D. [facility]    RPK/MEDQ  D:  09/15/2002  T:  09/15/2002  Job:  [id number]   cc:   Kizzie Furnish, M.D.  [address]  [place]  [place] [address]  Fax: [phone]

## 2010-06-10 NOTE — H&P (Signed)
NAMESEBERT, STOLLINGS NO.:  [id number]   MEDICAL RECORD NO.:  [id number]          PATIENT TYPE:  INP   LOCATION:  1823                         FACILITY:  [facility]   PHYSICIAN:  Salvadore Farber, M.D. LHCDATE OF BIRTH:  04-07-1940   DATE OF ADMISSION:  03/09/2004  DATE OF DISCHARGE:                                HISTORY & PHYSICAL   CHIEF COMPLAINT:  Chest pain.   HISTORY OF PRESENT ILLNESS:  Mr. Chestnutt is a 70 year old gentleman with  coronary artery disease, status post coronary artery bypass grafting in 2001  (LIMA to LAD and sequential radial to diagonal and OM).  In addition to his  coronary disease, he has a chronic chest pain syndrome which he says dates  to age 89.  His last admission for chest pain was in September 2005, when he  ruled out for myocardial infarction.  An adenosine Cardiolite then  demonstrated no evidence of ischemia and ejection fraction of 52%.  A  cardiac catheterization performed, in June 2005, had demonstrated that the  second limb of his sequential radial graft was occluded.  As noted, he has  subsequently been shown to have no evidence of ischemia by Cardiolite.  Mr.  Lagrow has very frequent chest pain at baseline; however, he now presents  with four days of what he describes as worsened than usual substernal chest  pain occurring solely at rest.  He presented to the ER because of a more  severe episode that awoke him from sleep at 3 this morning.  He described it  at its worst as an 8 out of 10, radiating from his sternum to his mid back.  There was no associated nausea, diaphoresis, dyspnea, or other radiation.  Over the subsequent hours, he has had milder discomfort which is fairly  typical for him.  He is currently free of discomfort.   PAST MEDICAL HISTORY:  1.  Coronary artery disease as above.  2.  Hiatal hernia.  3.  Hypertension.  4.  Dyslipidemia.  5.  Status post cholecystectomy.  6.  Questionable history of  obstructive sleep apnea.   ALLERGIES:  SEAFOOD causes facial swelling.   CURRENT MEDICATIONS:  1.  Fish oil.  2.  Metoprolol 25 mg p.o. t.i.d.  3.  Aspirin 81 mg per day.  4.  Protonix 40 mg twice per day.  5.  TriCor 145 mg per day.  6.  Lipitor 40 mg per day.  7.  Lisinopril 5 mg per day.   SOCIAL HISTORY:  The patient is married and lives in [place] with his wife.  He does not work.  He previously raised chickens.  He denies tobacco and  alcohol use.   FAMILY HISTORY:  Mother died at 44 of old age.  Father died at 4 of lung  cancer.  He has four siblings all of whom have had very premature coronary  artery disease.   REVIEW OF SYSTEMS:  Negative in detail except as above.   PHYSICAL EXAMINATION:  GENERAL:  He is a generally well-appearing  man in no  distress.  VITAL  SIGNS:  Heart rate 67, blood pressure initially was 197/90, declining  to 162/80 without therapy.  Temperature is 98.1.  Respiratory rate 18.  NECK:  He has no jugular venous distention and no thyromegaly.  LUNGS:  Clear to auscultation.  He has a well healed median sternotomy scar.  HEART:  There is a nondisplaced point of maximum cardiac impulse.  There is  a regular rate and rhythm without murmur, rub, or gallop.  ABDOMEN:  Soft,  nondistended, nontender.  There is no hepatosplenomegaly.  Bowel sounds are normal.  EXTREMITIES:  Warm without clubbing, cyanosis, edema, or ulceration.  Carotid pulses are 2+ bilaterally without bruit.  Femoral pulses are 2+  bilaterally without bruit.  DP pulses 2+ bilaterally.   LABORATORY STUDIES:  Remarkable for creatinine 1.2, hematocrit 39.  Troponin  less than 0.05 x 1.   Electrocardiogram demonstrates a normal sinus rhythm with a left bundle  branch block.  There is no change compared to a study of October 22, 2003.   Chest x-ray demonstrates normal cardiomediastinal silhouette and no  pulmonary infiltrate.   IMPRESSION/PLAN:  1.  Chest pain.  A difficult  situation in a gentleman with both chronic non-      cardiac chest pain and coronary disease.  Given the prolonged nature of      his pain and negative enzymes, I think it fairly unlikely that today's      discomfort represents cardiovascular pathology.  Nonetheless with the      radiation to the back, we will check CT to rule out dissection.  We also      will admit him to rule out myocardial infarction.  We will withhold      Heparin given relatively low likelihood of acute coronary syndrome and      concern for possible dissection.  We will continue on his aspirin, beta-      blocker, and ACE inhibitor.  Since I am concerned that compliance may      not be optimal, I will not make any changes in his dose at present,      however, once he has administered them in the hospital may need to      increase doses based on blood pressure.  2.  Hypertension.  Markedly elevated on arrival but less now.  We will treat      as above.  3.  Dyslipidemia.  Check a fasting lipid profile.  Continue Statin for now.  4.  Gastroesophageal reflux disease.  Continue Protonix.  Followed by Dr.      Marina Goodell.      WED/MEDQ  D:  03/09/2004  T:  03/09/2004  Job:  [id number]

## 2010-06-10 NOTE — Op Note (Signed)
[place]  Patient:    [name] [name], [name] [name]                       MRN: [id number] Proc. Date: 11/22/99 Adm. Date:  [id number] Attending:  Charlett Lango CC:         Madolyn Frieze. Jens Som, M.D. [facility] [place]   Operative Report  PREOPERATIVE DIAGNOSIS:  Left main disease with unstable angina.  POSTOPERATIVE DIAGNOSIS:  Left main disease with unstable angina.  OPERATION:  Median sternotomy, extracorporeal circulation, coronary artery bypass grafting x 3 (left internal mammary to left anterior descending, sequential left radial artery to first diagonal and first obtuse marginal).  SURGEON:  Salvatore Decent. Dorris Fetch, M.D.  FIRST ASSISTANT:  Sherrie George, P.A..  ANESTHESIA:  General.  FINDINGS:  Good targets, good conduits.  Preserved ventricular function.  INDICATIONS:  [name] [name] is a 70 year old gentleman who presents with a 2-3 month history of exertional chest and left arm discomfort.  He had progressed to having both rest and nocturnal discomfort.  He was admitted to the hospital and ruled out for myocardial infarction by enzymes.  Cardiac catheterization was performed and revealed a 70% left main stenosis as well as an 80% in the LAD just past the takeoff of the large diagonal branch.  There was no significant lesion in the right coronary system.  The patient was referred for coronary artery bypass grafting.  The indications, risks, benefits and alternatives were discussed in detail with the patient and his family.  The importance of all arterial conduits in a young gentleman to lessen the risk of re-do procedures was also discussed.  They understood the plan for a left radial and left internal mammary artery harvest, and proposed all arterial grafting.  He accepted the risks and agreed to proceed.  DESCRIPTION OF PROCEDURE:  [name] [name] was brought to the preop holding area on November 22, 1999.  Lines were placed  to monitor arterial, central, venous, and pulmonary arterial pressures.  EKG leads were placed for continuous telemetry. The patient was taken to the operating room, anesthetized and intubated.  A Foley catheter was placed.  Intravenous antibiotics were administered.  The chest, abdomen, and legs were prepped and draped in the usual fashion.  The left arm was prepped and draped separately.  An incision was made over the palpable radial pulse in the left wrist.  The incision initially was approximately 10 cm long.  The preoperative Hessie Diener test had been normal.  The incision was carried through the skin and subcutaneous tissue.  Hemostasis was achieved with electrocautery.  The radial artery was identified.  The overlying fascia was incised.  The radial artery was harvested using standard techniques.  It was harvested as a pedicle graft with both accompanying veins.  Branches were doubly clipped and divided.  Minimal cautery was used.  Initially the radial pedicle was dissected free.  A soft bulldog clamp was placed across the radial artery.  There was an excellent pulse distal to the clamp indicating adequate collateral flow.  The bulldog clamp was removed.  The incision was extended in a Lazy S fashion along the course of the radial artery.  The radial artery then was harvested along the length after harvesting an adequate length of the graft.  The patient was given 5000 units of heparin.  The radial artery was clamped and divided distally. There was excellent flow through the graft.  A soft bulldog clamp  was placed across the distal end.  The distal stump was ligated with a 4-0 Prolene suture ligature.  The mammary pedicle then was carefully inspected and bleeding sites were clipped.  The radial then was divided proximally.  The graft was handed off and placed in a heparinized saline papaverine solution.  The proximal stump was ligated with a 4-0 Prolene suture.  There was good hemostasis  in the base of the wound.  The wound was irrigated with warm normal saline containing Vancomycin.  The wound then was closed in 2 layers.  The subcutaneous tissue was closed with a running 3-0 Vicryl suture and the skin was closed with a 3-0 Vicryl subcuticular suture.  The arm then was wrapped and tucked at the site.  A median sternotomy was performed and the left internal mammary artery was harvested in a standard fashion.  The left internal mammary artery was an excellent quality graft as was the left radial.  Large branches were doubly clipped and divided.  Small branches were clipped on the mammary side and divided with a cautery on the chest wall side.  The patient was given the remaining full heparin dose prior to dividing the distal end of the mammary artery.  There was excellent flow to the cut end of the graft.  The mammary was placed in a papaverine soaked sponge and placed into the left pleural space.  The pericardium was opened.  The ascending aorta was inspected.  It was free of palpable atherosclerotic disease and was of normal size.  The aorta was cannulated via concentric 2-0 Ethibond nonpledgeted pursestring sutures.  The dual staged venous cannula was place via pursestring suture in the right atrial appendage.  Cardiopulmonary bypass was instituted and the patient was cooled to 32 degrees Celsius.  The coronary arteries were inspected and the anastomotic sites chosen.  The conduits were inspected and cut to length.  A foam pad was placed in the pericardium to protect the left phrenic nerve.  A temperature probe was placed in the myocardial septum and a cardioplegia cannula was placed in the ascending aorta.  The aorta was crossclamped, the left ventricle was emptied via the aortic root vac.  Cardiac arrest then was achieved with a combination of cold antegrade blood cardioplegia and topical iced saline.  Then 750 cc. of cardioplegia was administered.  The myocardial  septal temperature was 12 degrees Celsius.  The following distal anastomosis were performed.  First, the left radial artery was placed sequentially to the first diagonal  and first obtuse marginal.  A side-to-side anastomosis was done to the first diagonal with the arteriotomies oriented perpendicularly.  The left radial was a 2 mm good quality conduit.  The first diagonal was a 1.5 mm good quality target.  The anastomosis was performed with a running 8-0 Prolene suture.  The anastomosis was probed proximally and distally prior to tying the suture and there was excellent flow through the anastomosis with flushing.  Next, the distal end of the radial artery was anastomosed end-to-side to the first obtuse marginal branch of the left circumflex coronary artery.  This was a large anterolateral branch and bifurcated just beyond the site of the anastomosis.  It was a 1.8 mm good quality target.  Again the anastomosis was performed with a running 8-0 Prolene suture and was probed proximally and distally.  At the completion the anastomosis the graft was flushed and there was excellent flow.  Additional cardioplegia then was administered down the aortic root.  There was excellent back-bleeding from the radial artery.  A soft bulldog clamp was placed across the radial.  The anastomoses were inspected and there was good hemostasis at each.  Next, the left internal mammary was brought through a window in the pericardium anterior to the left phrenic nerve.  The distal end of the mammary artery was spatulated and was anastomosed end-to-side to the distal LAD.  The distal LAD was a 1.8 mm good quality target.  The mammary artery was a 2 mm good quality conduit.  At completion of the mammary to LAD anastomosis the bulldog clamp was removed from the mammary artery.  Immediate and rapid septal rewarming was noted. Lidocaine was administered.  The mammary pedicle was tacked to the epicardial surface  of the heart with 6-0 Prolene sutures, after assuring good hemostasis.  The aortic crossclamp was removed.  Total crossclamp time was 41 minutes.  A single defibrillation with 20 joules was required.  The radial artery then was cut to length.  A partial occlusion clamp was placed on the ascending aorta.  The cardioplegia cannula was removed.  The proximal radial artery anastomosis was performed to the ascending aorta with running 7-0 Prolene suture.  The aortotomy was made with a 4.0 mm punch.  At the completion of this anastomosis the bulldog clamp was removed from the radial artery to allow deairing prior to tying the suture.  The partial clamp then was removed.  The suture was tied.  A 7-0 Prolene horizontal mattress suture was required for bleeding from the heel of the anastomosis.  There was then good hemostasis. The distal anastomosis were inspected once again and all had good hemostasis at the anastomosis.  The patient is being rewarmed during the proximal anastomosis.  Epicardial pacing wires were placed on the right ventricle and right atrium. When the core temperature reached 37 degrees Celsius the patient was weaned from cardiopulmonary bypass without difficulty.  Total bypass time was 91 minutes.  The patient was on no inotropic support and was in sinus rhythm at the time of separation from bypass.  The patient was given a test dose of Protamine without adverse effect.  The atrial and aortic cannulae were removed.  There was good hemostasis at both cannulation sites.  The remainder of the Protamine was administered without incident.  The chest was irrigated with 1 L of warm normal saline containing 1 gram of vancomycin.  Additional intravenous antibiotics were administered. A left pleural and two mediastinal chest tubes were placed through separate subcostal incisions and secured with 1 silk sutures.  The pericardium was loosely reapproximated with interrupted 3-0 silk  sutures.  Care was taken not to kink any of the grafts.  The sternum was closed with heavy gauge stainless steel wires.  The pectoralis fascia was closed with a running #1 Vicryl suture.  The subcutaneous tissue was closed with a running 2-0 Vicryl suture and the skin was closed with a 3-0 Vicryl subcuticular suture.  All sponge, needle and instrument counts were correct at the end of the procedure.  There were no intraoperative complications.  The patient remained hemodynamically stable throughout the post bypass course and was taken from the operating room to the surgical intensive care unit intubated in stable condition.DD:  11/22/99 TD:  11/22/99 Job: [id number]

## 2010-06-10 NOTE — H&P (Signed)
NAMENEAL, TRULSON NO.:  [id number]   MEDICAL RECORD NO.:  [id number]          PATIENT TYPE:  EMS   LOCATION:  [facility]JO                         FACILITY:  [facility]   PHYSICIAN:  Madolyn Frieze. Jens Som, MD, FACCDATE OF BIRTH:  1940/08/30   DATE OF ADMISSION:  12/02/2005  DATE OF DISCHARGE:                                HISTORY & PHYSICAL   HISTORY OF PRESENT ILLNESS:  Mr. Tomasik is a 70 year old male with a past  medical history of coronary artery disease status post coronary artery  bypass graft, chronic chest pain, left bundle branch block, hypertension,  hyperlipidemia, increased bilirubin, small right upper lobe nodule followed  by Dr. Nathanial Rancher, cerebrovascular disease, we were asked to evaluate for chest  pain.  The patient does have a history of coronary artery disease status  post coronary artery bypass graft.  His last catheterization was in November  of 2006.  At that time his ejection fraction was 60%.  He had an 80% left  main extending into his circumflex.  There was a 99% LAD followed by a total  occlusion.  His ramus intermedius had minor plaquing and his right coronary  artery had a 90% ostial lesion.  He had successful PCI of his left  main/circumflex lesion as well as his proximal right coronary artery.  Noted  LIMA to the LAD and free radial graft to the diagonal were patent.  The  patient has had problems with chronic chest pain.  He presents today with  substernal/epigastric pain that is described as a pressure.  The pain can  radiate towards his upper chest.  It typically occurs after eating and he  has this on a daily basis.  This is typically resolved with ambulation and  with belching.  He had more prolonged pain today that did not resolve with  walking and he presented to the emergency room and we were asked to further  evaluate.  Note, the pain is not pleuritic and is nonexertional.  He states  that he has had some exertional chest pain since he was  a young man.  He has  had some dyspnea but there has been no diaphoresis and there is no vomiting.  His medications include:  1. Protonix 40 p.o. daily.  2. Lopressor 50 mg p.o. b.i.d.  3. Enalapril 5 mg p.o. q. day.  4. Aspirin.   HE IS ALLERGIC TO SHELLFISH.   SOCIAL HISTORY:  He does not smoke nor does he consume alcohol.   FAMILY HISTORY:  Strongly positive for coronary artery disease.   PAST MEDICAL HISTORY:  Significant for hypertension and hyperlipidemia but  there is no diabetes mellitus.  He does have a history of coronary disease  and status post coronary artery bypass graft as described in the HPI.  He  also has chronic chest pain.  He also has a history of mild cerebrovascular  disease.  He has had a prior cholecystectomy.  There is also a history of  increased bilirubin and a small right upper lobe nodule that is followed by  Dr. Nathanial Rancher in [place].  REVIEW OF SYSTEMS:  He denies any headaches at present.  He is being treated  for a sinus infection by his report and has had a productive cough.  There  is no hemoptysis.  There is no dysphagia or odynophagia and there is nor  melena or hematochezia.  There is no dysuria, hematuria.  There is no rash  or urticaria.  There is no orthopnea, PND or pedal edema.  The remainder of  systems are negative.   PHYSICAL EXAM:  VITAL SIGNS:  His physical exam today shows a blood pressure  of 122/71 and his pulse is 62.  He is afebrile.  GENERAL:  He is well-developed and well-nourished, in no acute distress.  SKIN:  His skin is warm and dry.  He does not appear depressed.  EXTREMITIES:  There is no peripheral clubbing.  HEENT:  Unremarkable.  PERRLA. EOMIs.  NECK:  Supple with normal motion bilaterally and I cannot appreciate bruits.  There was no jugular vein distention and I cannot appreciate thyromegaly.  CHEST:  Clear to auscultation, normal expansion.  CARDIOVASCULAR:  Regular rate and rhythm, normal S1, S2.  I cannot   appreciate murmurs, rubs, or gallops.  Note the patient is status post  sternotomy.  ABDOMINAL EXAM:  Mild epigastric tenderness to palpation but no rebound or  guarding.  There is no hepatomegaly and no masses are appreciated.  There is  no abdominal bruits.  EXTREMITIES:  He has 2+ femoral pulses bilaterally, no bruits.  His  extremities show no edema.  I can palpate no cords.  He has 2+ posterior  tibial pulses bilaterally.  NEUROLOGICAL EXAM:  Grossly intact.   His electrocardiogram shows a sinus rhythm with a left bundle branch block  which is old.  His chest x-ray shows mild cardiomegaly and postoperative  changes but no acute findings are noted.  His initial hemoglobin is 13.6.  Potassium is 3.8.  His BUN is 13 with a creatinine pending.  His initial  enzymes are negative.   DIAGNOSES:  1. Atypical chest pain chronic in nature.  2. History of gastroesophageal reflux disease.  3. Coronary artery disease status post coronary artery bypass graft.  4. Left bundle branch block.  5. Hypertension.  6. Hyperlipidemia.  7. History of cerebrovascular disease.  8. History of increased bilirubin.  9. History of small right upper lobe nodule followed by Dr. Nathanial Rancher.   PLAN:  Mr. Rosencrans presents with chest pain that is atypical and most  consistent with gastrointestinal etiology.  We will admit and rule out  myocardial infarction with serial enzymes.  If his enzymes are negative we  will plan an outpatient Myoview for risk stratification.  We will continue  with his aspirin, Lopressor, and add DVT prophylaxis Lovenox.  I will also  resume Zocor at 40 mg p.o. q.h.s.  He also has carotid disease and we will  need to follow up carotid Doppler as an outpatient.  We will continue with  his Protonix for a probable reflux.      Madolyn Frieze Jens Som, MD, [facility]  Electronically Signed     BSC/MEDQ  D:  12/02/2005  T:  12/03/2005  Job:  [id number]

## 2010-06-10 NOTE — Op Note (Signed)
[facility]  Patient:    [name] [name], [name] [name]                       MRN: [id number] Proc. Date: 06/20/00 Adm. Date:  [id number] Attending:  Meredith Leeds                           Operative Report  PREOPERATIVE DIAGNOSIS:  Symptomatic gallstones.  POSTOPERATIVE DIAGNOSIS:  Symptomatic gallstones.  OPERATION:  Laparoscopic cholecystectomy.  SURGEON:  Zigmund Daniel, M.D.  ASSISTANT:  Dr. Ezzard Standing.  ANESTHESIA:  General.  PROCEDURE:  After the patient was adequately monitored and anesthetized and had routine preparation and draping of the abdomen, I made a short transverse infraumbilical incision after anesthetizing the area.  I dissected down to the fascia, opened it longitudinally, opened the peritoneum bluntly, placed an 0 Vicryl pursestring suture in the fascia, secured a Hasson cannula, and inflated the abdomen with CO2.  I looked around with the scope and saw no abnormalities, except for slight edema of the gallbladder.  I put in a 10 mm epigastric port and two 5-mm right lateral ports, and then position the patient head, foot down, and tilted to the left nicely exposing the gallbladder.  There were a few adhesions of omentum to the under surface of the gallbladder and the duodenum was slightly pulled up towards the gallbladder, and I took down the adhesions exposing the infundibulum of the gallbladder and the haptoduodenal ligament.  I retracted the fundus with the gallbladder upward and the infundibulum laterally and dissected out the cystic duct and cystic artery, clearly visualizing the cystic duct emerged from the infundibulum of the gallbladder.  I clipped the cystic artery with three clips and divided between the two closer to the gallbladder, and clipped the cystic duct with four clips and divided between the two closest to the gallbladder.  I then dissected the gallbladder from the liver using the hook and spatula cautery devices  and got good hemostasis with the cautery.  I checked to see that all clips were secure and found that to be the case. Hemostasis was excellent.  I detached the gallbladder from the liver and removed it through the umbilical incision and then tied the pursestring suture.  It came out intact with no evident leakage of bile.  Sponge, needle, and instrument counts were correct.  I removed the lateral ports under direct vision and then released CO2 from the abdomen and removed the epigastric port. I closed the skin of all incisions with intracuticular 4-0 Vicryl and Steri-Strips.  The patient tolerated the operation well. DD:  06/20/00 TD:  06/20/00 Job: [id number]

## 2010-10-27 LAB — LIPID PANEL
HDL: 27 mg/dL — ABNORMAL LOW (ref >39)
VLDL: 25 mg/dL (ref 0–40)

## 2010-10-27 LAB — POCT I-STAT, CHEM 8
BUN: 12 mg/dL (ref 6–23)
Calcium, Ion: 1.01 mmol/L — ABNORMAL LOW (ref 1.12–1.32)
Chloride: 107 mEq/L (ref 96–112)
Glucose, Bld: 97 mg/dL (ref 70–99)
HCT: 40 % (ref 39.0–52.0)
Potassium: 4 mEq/L (ref 3.5–5.1)

## 2010-10-27 LAB — BASIC METABOLIC PANEL
BUN: 10 mg/dL (ref 6–23)
Calcium: 8.2 mg/dL — ABNORMAL LOW (ref 8.4–10.5)
Calcium: 8.3 mg/dL — ABNORMAL LOW (ref 8.4–10.5)
Creatinine, Ser: 1.03 mg/dL (ref 0.4–1.5)
Creatinine, Ser: 1.15 mg/dL (ref 0.4–1.5)
GFR calc Af Amer: >60 mL/min (ref >60)
GFR calc non Af Amer: >60 mL/min (ref >60)
GFR calc non Af Amer: >60 mL/min (ref >60)
Potassium: 4 mEq/L (ref 3.5–5.1)
Sodium: 141 mEq/L (ref 135–145)

## 2010-10-27 LAB — CBC
HCT: 33.9 % — ABNORMAL LOW (ref 39.0–52.0)
Hemoglobin: 11.5 g/dL — ABNORMAL LOW (ref 13.0–17.0)
Hemoglobin: 13.7 g/dL (ref 13.0–17.0)
MCHC: 33.4 g/dL (ref 30.0–36.0)
MCHC: 33.9 g/dL (ref 30.0–36.0)
MCHC: 34.3 g/dL (ref 30.0–36.0)
MCV: 91.9 fL (ref 78.0–100.0)
MCV: 93.2 fL (ref 78.0–100.0)
Platelets: 153 10*3/uL (ref 150–400)
RBC: 3.64 MIL/uL — ABNORMAL LOW (ref 4.22–5.81)
RBC: 4.41 MIL/uL (ref 4.22–5.81)
RDW: 12.9 % (ref 11.5–15.5)
RDW: 13 % (ref 11.5–15.5)
WBC: 5.4 10*3/uL (ref 4.0–10.5)

## 2010-10-27 LAB — DIFFERENTIAL
Basophils Relative: 1 % (ref 0–1)
Lymphocytes Relative: 34 % (ref 12–46)
Lymphs Abs: 1.8 10*3/uL (ref 0.7–4.0)
Monocytes Absolute: 0.5 10*3/uL (ref 0.1–1.0)
Monocytes Relative: 8 % (ref 3–12)
Neutro Abs: 3.1 10*3/uL (ref 1.7–7.7)
Neutrophils Relative %: 56 % (ref 43–77)

## 2010-10-27 LAB — CARDIAC PANEL(CRET KIN+CKTOT+MB+TROPI)
CK, MB: 1 ng/mL (ref 0.3–4.0)
CK, MB: 1 ng/mL (ref 0.3–4.0)
CK, MB: 1.2 ng/mL (ref 0.3–4.0)
CK, MB: 1.6 ng/mL (ref 0.3–4.0)
Relative Index: INVALID (ref 0.0–2.5)
Total CK: 39 U/L (ref 7–232)
Total CK: 83 U/L (ref 7–232)
Troponin I: 0.01 ng/mL (ref 0.00–0.06)
Troponin I: 0.01 ng/mL (ref 0.00–0.06)

## 2010-10-27 LAB — GLUCOSE, CAPILLARY

## 2010-10-27 LAB — POCT CARDIAC MARKERS
CKMB, poc: <1 ng/mL — ABNORMAL LOW (ref 1.0–8.0)
Troponin i, poc: <0.05 ng/mL (ref 0.00–0.09)

## 2010-10-27 LAB — PROTIME-INR
INR: 1 (ref 0.00–1.49)
Prothrombin Time: 13.6 seconds (ref 11.6–15.2)

## 2010-10-27 LAB — B-NATRIURETIC PEPTIDE (CONVERTED LAB): Pro B Natriuretic peptide (BNP): 133 pg/mL — ABNORMAL HIGH (ref 0.0–100.0)

## 2010-11-02 LAB — I-STAT 8, (EC8 V) (CONVERTED LAB)
BUN: 12
Bicarbonate: 25 — ABNORMAL HIGH
HCT: 41
Hemoglobin: 13.9
Operator id: 270651
Sodium: 140
pCO2, Ven: 41.7 — ABNORMAL LOW

## 2010-11-02 LAB — CBC
Hemoglobin: 13.2
MCHC: 34
RBC: 4.36

## 2010-11-02 LAB — DIFFERENTIAL
Basophils Absolute: 0.1
Basophils Relative: 1
Lymphocytes Relative: 38
Monocytes Absolute: 0.4
Monocytes Relative: 8
Neutro Abs: 2.7
Neutrophils Relative %: 52

## 2010-11-02 LAB — POCT I-STAT CREATININE: Creatinine, Ser: 1.4

## 2011-02-01 DIAGNOSIS — J209 Acute bronchitis, unspecified: Secondary | ICD-10-CM | POA: Diagnosis not present

## 2011-02-01 DIAGNOSIS — J0100 Acute maxillary sinusitis, unspecified: Secondary | ICD-10-CM | POA: Diagnosis not present

## 2011-02-14 ENCOUNTER — Other Ambulatory Visit: Payer: Self-pay | Admitting: Cardiology

## 2011-02-14 MED ORDER — ENALAPRIL MALEATE 20 MG PO TABS: 20.0000 mg | ORAL_TABLET | Freq: Two times a day (BID) | ORAL | Status: DC

## 2011-02-19 ENCOUNTER — Other Ambulatory Visit: Payer: Self-pay | Admitting: Cardiology

## 2011-03-02 DIAGNOSIS — J0100 Acute maxillary sinusitis, unspecified: Secondary | ICD-10-CM | POA: Diagnosis not present

## 2011-03-04 ENCOUNTER — Other Ambulatory Visit: Payer: Self-pay

## 2011-03-04 ENCOUNTER — Emergency Department (HOSPITAL_COMMUNITY): Payer: Medicare Other

## 2011-03-04 ENCOUNTER — Encounter (HOSPITAL_COMMUNITY): Payer: Self-pay | Admitting: Emergency Medicine

## 2011-03-04 ENCOUNTER — Observation Stay (HOSPITAL_COMMUNITY)
Admission: EM | Admit: 2011-03-04 | Discharge: 2011-03-05 | Disposition: A | Discharge status: Home / Self Care | Payer: Medicare Other | Attending: Family Medicine | Admitting: Family Medicine

## 2011-03-04 DIAGNOSIS — I6529 Occlusion and stenosis of unspecified carotid artery: Secondary | ICD-10-CM

## 2011-03-04 DIAGNOSIS — E785 Hyperlipidemia, unspecified: Secondary | ICD-10-CM | POA: Diagnosis not present

## 2011-03-04 DIAGNOSIS — K219 Gastro-esophageal reflux disease without esophagitis: Secondary | ICD-10-CM | POA: Diagnosis present

## 2011-03-04 DIAGNOSIS — R1031 Right lower quadrant pain: Secondary | ICD-10-CM | POA: Diagnosis not present

## 2011-03-04 DIAGNOSIS — Z23 Encounter for immunization: Secondary | ICD-10-CM | POA: Diagnosis not present

## 2011-03-04 DIAGNOSIS — R0789 Other chest pain: Secondary | ICD-10-CM | POA: Diagnosis not present

## 2011-03-04 DIAGNOSIS — I2510 Atherosclerotic heart disease of native coronary artery without angina pectoris: Secondary | ICD-10-CM | POA: Diagnosis not present

## 2011-03-04 DIAGNOSIS — I447 Left bundle-branch block, unspecified: Secondary | ICD-10-CM

## 2011-03-04 DIAGNOSIS — R1310 Dysphagia, unspecified: Secondary | ICD-10-CM

## 2011-03-04 DIAGNOSIS — R1013 Epigastric pain: Principal | ICD-10-CM

## 2011-03-04 DIAGNOSIS — I10 Essential (primary) hypertension: Secondary | ICD-10-CM

## 2011-03-04 DIAGNOSIS — E7800 Pure hypercholesterolemia, unspecified: Secondary | ICD-10-CM

## 2011-03-04 DIAGNOSIS — Z860100 Personal history of colon polyps, unspecified: Secondary | ICD-10-CM

## 2011-03-04 DIAGNOSIS — N200 Calculus of kidney: Secondary | ICD-10-CM | POA: Diagnosis not present

## 2011-03-04 DIAGNOSIS — Z951 Presence of aortocoronary bypass graft: Secondary | ICD-10-CM | POA: Diagnosis present

## 2011-03-04 DIAGNOSIS — I25810 Atherosclerosis of coronary artery bypass graft(s) without angina pectoris: Secondary | ICD-10-CM | POA: Diagnosis not present

## 2011-03-04 DIAGNOSIS — F411 Generalized anxiety disorder: Secondary | ICD-10-CM

## 2011-03-04 DIAGNOSIS — C61 Malignant neoplasm of prostate: Secondary | ICD-10-CM | POA: Diagnosis not present

## 2011-03-04 DIAGNOSIS — R079 Chest pain, unspecified: Secondary | ICD-10-CM | POA: Diagnosis not present

## 2011-03-04 DIAGNOSIS — J984 Other disorders of lung: Secondary | ICD-10-CM | POA: Diagnosis not present

## 2011-03-04 HISTORY — DX: Malignant neoplasm of prostate: C61

## 2011-03-04 HISTORY — DX: Acute pancreatitis without necrosis or infection, unspecified: K85.90

## 2011-03-04 HISTORY — DX: Essential (primary) hypertension: I10

## 2011-03-04 HISTORY — DX: Atherosclerotic heart disease of native coronary artery without angina pectoris: I25.10

## 2011-03-04 LAB — COMPREHENSIVE METABOLIC PANEL
ALT: 12 U/L (ref 0–53)
Alkaline Phosphatase: 42 U/L (ref 39–117)
BUN: 15 mg/dL (ref 6–23)
CO2: 25 mEq/L (ref 19–32)
Calcium: 9.2 mg/dL (ref 8.4–10.5)
GFR calc Af Amer: >90 mL/min (ref >90)
GFR calc non Af Amer: 83 mL/min — ABNORMAL LOW (ref >90)
Glucose, Bld: 94 mg/dL (ref 70–99)
Sodium: 138 mEq/L (ref 135–145)

## 2011-03-04 LAB — CBC
HCT: 38.2 % — ABNORMAL LOW (ref 39.0–52.0)
HCT: 40.6 % (ref 39.0–52.0)
Hemoglobin: 13.2 g/dL (ref 13.0–17.0)
Hemoglobin: 13.9 g/dL (ref 13.0–17.0)
MCH: 29.5 pg (ref 26.0–34.0)
RBC: 4.47 MIL/uL (ref 4.22–5.81)
RDW: 14.6 % (ref 11.5–15.5)
WBC: 7.1 10*3/uL (ref 4.0–10.5)

## 2011-03-04 LAB — URINALYSIS, ROUTINE W REFLEX MICROSCOPIC
Glucose, UA: NEGATIVE mg/dL
Hgb urine dipstick: NEGATIVE
Ketones, ur: NEGATIVE mg/dL
Protein, ur: NEGATIVE mg/dL

## 2011-03-04 LAB — CARDIAC PANEL(CRET KIN+CKTOT+MB+TROPI)
CK, MB: 2.3 ng/mL (ref 0.3–4.0)
Troponin I: <0.3 ng/mL (ref <0.30)

## 2011-03-04 LAB — CREATININE, SERUM
Creatinine, Ser: 1.02 mg/dL (ref 0.50–1.35)
GFR calc Af Amer: 84 mL/min — ABNORMAL LOW (ref >90)
GFR calc non Af Amer: 72 mL/min — ABNORMAL LOW (ref >90)

## 2011-03-04 MED ORDER — ONDANSETRON HCL 4 MG/2ML IJ SOLN: 4.0000 mg | Freq: Once | INTRAMUSCULAR | Status: DC

## 2011-03-04 MED ORDER — NITROGLYCERIN 2 % TD OINT
0.5000 [in_us] | TOPICAL_OINTMENT | Freq: Once | TRANSDERMAL | Status: DC
  Filled 2011-03-04: qty 1

## 2011-03-04 MED ORDER — ASPIRIN EC 81 MG PO TBEC
81.0000 mg | DELAYED_RELEASE_TABLET | Freq: Every day | ORAL | Status: DC
  Administered 2011-03-04 – 2011-03-05 (×2): 81 mg via ORAL
  Filled 2011-03-04 (×2): qty 1

## 2011-03-04 MED ORDER — METOCLOPRAMIDE HCL 5 MG PO TABS
5.0000 mg | ORAL_TABLET | Freq: Four times a day (QID) | ORAL | Status: DC | PRN
  Administered 2011-03-04: 5 mg via ORAL
  Filled 2011-03-04: qty 1

## 2011-03-04 MED ORDER — ASPIRIN EC 325 MG PO TBEC
DELAYED_RELEASE_TABLET | ORAL | Status: AC
  Administered 2011-03-04: 325 mg
  Filled 2011-03-04: qty 1

## 2011-03-04 MED ORDER — ENALAPRIL MALEATE 20 MG PO TABS
20.0000 mg | ORAL_TABLET | Freq: Two times a day (BID) | ORAL | Status: DC
  Administered 2011-03-04 – 2011-03-05 (×2): 20 mg via ORAL
  Filled 2011-03-04 (×3): qty 1

## 2011-03-04 MED ORDER — SODIUM CHLORIDE 0.9 % IJ SOLN
3.0000 mL | Freq: Two times a day (BID) | INTRAMUSCULAR | Status: DC
  Administered 2011-03-04: 3 mL via INTRAVENOUS

## 2011-03-04 MED ORDER — OMEGA-3-ACID ETHYL ESTERS 1 G PO CAPS
2.0000 g | ORAL_CAPSULE | Freq: Two times a day (BID) | ORAL | Status: DC
  Administered 2011-03-04 – 2011-03-05 (×2): 2 g via ORAL
  Filled 2011-03-04 (×3): qty 2

## 2011-03-04 MED ORDER — ONDANSETRON HCL 8 MG PO TABS
4.0000 mg | ORAL_TABLET | Freq: Once | ORAL | Status: AC
  Administered 2011-03-04: 4 mg via ORAL

## 2011-03-04 MED ORDER — ISOSORBIDE MONONITRATE ER 30 MG PO TB24
30.0000 mg | ORAL_TABLET | Freq: Two times a day (BID) | ORAL | Status: DC
  Administered 2011-03-04 – 2011-03-05 (×3): 30 mg via ORAL
  Filled 2011-03-04 (×4): qty 1

## 2011-03-04 MED ORDER — DIPHENHYDRAMINE HCL 25 MG PO TABS
25.0000 mg | ORAL_TABLET | Freq: Every day | ORAL | Status: DC
  Administered 2011-03-04: 25 mg via ORAL
  Filled 2011-03-04 (×2): qty 1

## 2011-03-04 MED ORDER — SODIUM CHLORIDE 0.9 % IJ SOLN
3.0000 mL | Freq: Two times a day (BID) | INTRAMUSCULAR | Status: DC
  Administered 2011-03-04 (×2): 3 mL via INTRAVENOUS

## 2011-03-04 MED ORDER — SODIUM CHLORIDE 0.9 % IV SOLN: 250.0000 mL | INTRAVENOUS | Status: DC | PRN

## 2011-03-04 MED ORDER — PANTOPRAZOLE SODIUM 40 MG PO TBEC
40.0000 mg | DELAYED_RELEASE_TABLET | Freq: Every day | ORAL | Status: DC
  Administered 2011-03-05: 40 mg via ORAL

## 2011-03-04 MED ORDER — PNEUMOCOCCAL VAC POLYVALENT 25 MCG/0.5ML IJ INJ
0.5000 mL | INJECTION | INTRAMUSCULAR | Status: AC
  Administered 2011-03-05: 0.5 mL via INTRAMUSCULAR
  Filled 2011-03-04: qty 0.5

## 2011-03-04 MED ORDER — ONDANSETRON HCL 4 MG PO TABS
4.0000 mg | ORAL_TABLET | Freq: Four times a day (QID) | ORAL | Status: DC | PRN
  Administered 2011-03-04: 4 mg via ORAL
  Filled 2011-03-04: qty 1

## 2011-03-04 MED ORDER — CLOPIDOGREL BISULFATE 75 MG PO TABS
75.0000 mg | ORAL_TABLET | Freq: Every day | ORAL | Status: DC
  Administered 2011-03-04 – 2011-03-05 (×2): 75 mg via ORAL
  Filled 2011-03-04 (×2): qty 1

## 2011-03-04 MED ORDER — ALUM & MAG HYDROXIDE-SIMETH 200-200-20 MG/5ML PO SUSP
30.0000 mL | Freq: Four times a day (QID) | ORAL | Status: DC | PRN
  Administered 2011-03-04: 30 mL via ORAL
  Filled 2011-03-04: qty 30

## 2011-03-04 MED ORDER — ONDANSETRON HCL 4 MG/2ML IJ SOLN: 4.0000 mg | Freq: Four times a day (QID) | INTRAMUSCULAR | Status: DC | PRN

## 2011-03-04 MED ORDER — HEPARIN SODIUM (PORCINE) 5000 UNIT/ML IJ SOLN
5000.0000 [IU] | Freq: Three times a day (TID) | INTRAMUSCULAR | Status: DC
  Administered 2011-03-04 – 2011-03-05 (×3): 5000 [IU] via SUBCUTANEOUS
  Filled 2011-03-04 (×6): qty 1

## 2011-03-04 MED ORDER — METOPROLOL TARTRATE 50 MG PO TABS
75.0000 mg | ORAL_TABLET | Freq: Two times a day (BID) | ORAL | Status: DC
  Administered 2011-03-04 – 2011-03-05 (×2): 75 mg via ORAL
  Filled 2011-03-04 (×3): qty 1

## 2011-03-04 MED ORDER — NITROGLYCERIN 0.4 MG SL SUBL: 0.4000 mg | SUBLINGUAL_TABLET | SUBLINGUAL | Status: DC | PRN

## 2011-03-04 MED ORDER — ASPIRIN 325 MG PO TABS
325.0000 mg | ORAL_TABLET | Freq: Every day | ORAL | Status: DC
  Filled 2011-03-04: qty 1

## 2011-03-04 MED ORDER — INFLUENZA VIRUS VACC SPLIT PF IM SUSP
0.5000 mL | INTRAMUSCULAR | Status: AC
  Administered 2011-03-05: 0.5 mL via INTRAMUSCULAR
  Filled 2011-03-04: qty 0.5

## 2011-03-04 MED ORDER — ONDANSETRON 4 MG PO TBDP
ORAL_TABLET | ORAL | Status: AC
  Administered 2011-03-04: 10:00:00
  Filled 2011-03-04: qty 1

## 2011-03-04 MED ORDER — SODIUM CHLORIDE 0.9 % IJ SOLN: 3.0000 mL | INTRAMUSCULAR | Status: DC | PRN

## 2011-03-04 MED ORDER — AMLODIPINE BESYLATE 2.5 MG PO TABS
2.5000 mg | ORAL_TABLET | Freq: Two times a day (BID) | ORAL | Status: DC
  Administered 2011-03-04 – 2011-03-05 (×2): 2.5 mg via ORAL
  Filled 2011-03-04 (×4): qty 1

## 2011-03-04 NOTE — H&P (Signed)
PCP:   Dr. Buel Ream  Dr. Jens Som (cards)   Chief Complaint:  Chest and upper abdomen discomfort  HPI: Pt is a 71 y/o caucasian male with history of Reglux,  CAD s/p prior CABG as well as a history of stents.  Patient is know to have chronic chest pain and is presenting to the ED complaining of chest discomfort.  States that it is a pressure like sensation that is at his upper left chest and radiates to his abdomen and turns into a burning sensation.  Comes on insidiously and is made worse by eating or drinking fluids.  Not made worse with exertion.  Travels from his upper shoulder to his epigastric region.  Denies any recent cough or SOB.  Nitroglycerin did not help alleviate the discomfort.   Patient also has a history of Reflux of which he takes Protonix 40 mg po BID.  He denies skipping any doses recently.  In the ED Ct of abdomen was obtained which was interpreted as no CT findings to account for the patient's abd pain.  Chest x ray was obtained which was negative for acute cardiopulmonary findings. Troponin was negative. U/A was also negative.  EKG did not show any ST elevations or depressions. Allergies:   Allergies  Allergen Reactions  . Codeine   . Hydrocodone   . BJY:NWGNFAOZHYQ+MVHQIONGE+XBMWUXLKGM Acid+Aspartame     REACTION: 'Burns' Stomach  . Morphine   . Nitrofuran Derivatives   . Other     Mycins  . Oxycodone Hcl   . Shellfish Allergy   . Tramadol   . Zolpidem Tartrate       Past Medical History  Diagnosis Date  . Hypertension   . Prostate cancer   . Pancreatitis   . Hernia     hiatel  . Coronary artery disease     Past Surgical History  Procedure Date  . Heart stents   . Cholecystectomy   . Inguinal hernia repair     bilateral    Prior to Admission medications   Medication Sig Start Date End Date Taking? Authorizing Provider  amLODipine (NORVASC) 5 MG tablet Take 2.5 mg by mouth 2 (two) times daily.   Yes Historical Provider, MD  aspirin EC 81 MG tablet  Take 81 mg by mouth daily.   Yes Historical Provider, MD  clopidogrel (PLAVIX) 75 MG tablet Take 75 mg by mouth daily.   Yes Historical Provider, MD  diphenhydrAMINE (BENADRYL) 25 MG tablet Take 25 mg by mouth at bedtime. sleep   Yes Historical Provider, MD  enalapril (VASOTEC) 20 MG tablet Take 1 tablet (20 mg total) by mouth 2 (two) times daily. 02/14/11  Yes Lewayne Bunting, MD  isosorbide mononitrate (IMDUR) 60 MG 24 hr tablet Take 30 mg by mouth 2 (two) times daily.   Yes Historical Provider, MD  metoCLOPramide (REGLAN) 10 MG tablet Take 5 mg by mouth 4 (four) times daily as needed. Stomach pain   Yes Historical Provider, MD  metoprolol (LOPRESSOR) 50 MG tablet Take 75 mg by mouth 2 (two) times daily.   Yes Historical Provider, MD  nitroGLYCERIN (NITROSTAT) 0.4 MG SL tablet Place 0.4 mg under the tongue every 5 (five) minutes as needed. Chest pain   Yes Historical Provider, MD  omega-3 acid ethyl esters (LOVAZA) 1 G capsule Take 2 g by mouth 2 (two) times daily.   Yes Historical Provider, MD  pantoprazole (PROTONIX) 40 MG tablet Take 40 mg by mouth daily.   Yes Historical Provider, MD  Social History:  reports that he has never smoked. His smokeless tobacco use includes Chew. He reports that he does not drink alcohol or use illicit drugs.  Family History  Problem Relation Age of Onset  . Heart disease Mother   . Cancer Father   . Heart disease Sister   . Heart disease Brother     Review of Systems:  Constitutional: Denies fever, chills, diaphoresis, appetite change and fatigue.  HEENT: Denies photophobia, eye pain, redness, hearing loss, ear pain, congestion, sore throat, rhinorrhea, sneezing, mouth sores, trouble swallowing, neck pain, neck stiffness and tinnitus.   Respiratory: Denies SOB, DOE, cough, chest tightness,  and wheezing.   Cardiovascular:+ chest pain, denies palpitations and leg swelling.  Gastrointestinal: Denies nausea, vomiting,+ epigastric abdominal pain, Denies  diarrhea, constipation, blood in stool and abdominal distention.  Genitourinary: Denies dysuria, urgency, frequency, hematuria, flank pain and difficulty urinating.  Musculoskeletal: Denies myalgias, back pain, joint swelling, arthralgias and gait problem.  Skin: Denies pallor, rash and wound.  Neurological: Denies dizziness, seizures, syncope, weakness, light-headedness, numbness and headaches.  Hematological: Denies adenopathy. Easy bruising, personal or family bleeding history  Psychiatric/Behavioral: Denies suicidal ideation, mood changes, confusion, nervousness, sleep disturbance and agitation   Physical Exam: Blood pressure 145/66, pulse 62, temperature 97.5 F (36.4 C), temperature source Oral, resp. rate 14, SpO2 98.00%. General: Alert, awake, oriented x3, in no acute distress. Appears comfortable HEENT: atraumatic, normocephalic, EOMI, PERRLA, No bruits, no goiter. Heart: Regular rate and rhythm, without murmurs, rubs, gallops. Lungs: Clear to auscultation bilaterally. Abdomen: Soft, nontender to palpation no guarding or rebound tenderness, nondistended, positive bowel sounds. Extremities: No clubbing cyanosis or edema with positive pedal pulses. Neuro: Grossly intact, nonfocal.    Labs on Admission:  Results for orders placed during the hospital encounter of 03/04/11 (from the past 48 hour(s))  CBC     Status: Abnormal   Collection Time   03/04/11  8:15 AM      Component Value Range Comment   WBC 6.9  4.0 - 10.5 (K/uL)    RBC 4.47  4.22 - 5.81 (MIL/uL)    Hemoglobin 13.2  13.0 - 17.0 (g/dL)    HCT 04.5 (*) 40.9 - 52.0 (%)    MCV 85.5  78.0 - 100.0 (fL)    MCH 29.5  26.0 - 34.0 (pg)    MCHC 34.6  30.0 - 36.0 (g/dL)    RDW 81.1  91.4 - 78.2 (%)    Platelets 157  150 - 400 (K/uL)   COMPREHENSIVE METABOLIC PANEL     Status: Abnormal   Collection Time   03/04/11  8:15 AM      Component Value Range Comment   Sodium 138  135 - 145 (mEq/L)    Potassium 3.7  3.5 - 5.1 (mEq/L)      Chloride 102  96 - 112 (mEq/L)    CO2 25  19 - 32 (mEq/L)    Glucose, Bld 94  70 - 99 (mg/dL)    BUN 15  6 - 23 (mg/dL)    Creatinine, Ser 9.56  0.50 - 1.35 (mg/dL)    Calcium 9.2  8.4 - 10.5 (mg/dL)    Total Protein 6.4  6.0 - 8.3 (g/dL)    Albumin 3.6  3.5 - 5.2 (g/dL)    AST 12  0 - 37 (U/L)    ALT 12  0 - 53 (U/L)    Alkaline Phosphatase 42  39 - 117 (U/L)    Total Bilirubin 0.9  0.3 - 1.2 (mg/dL)    GFR calc non Af Amer 83 (*) >90 (mL/min)    GFR calc Af Amer >90  >90 (mL/min)   LIPASE, BLOOD     Status: Abnormal   Collection Time   03/04/11  8:15 AM      Component Value Range Comment   Lipase 75 (*) 11 - 59 (U/L)   TROPONIN I     Status: Normal   Collection Time   03/04/11  8:17 AM      Component Value Range Comment   Troponin I <0.30  <0.30 (ng/mL)   URINALYSIS, ROUTINE W REFLEX MICROSCOPIC     Status: Normal   Collection Time   03/04/11  9:09 AM      Component Value Range Comment   Color, Urine YELLOW  YELLOW     APPearance CLEAR  CLEAR     Specific Gravity, Urine 1.013  1.005 - 1.030     pH 6.5  5.0 - 8.0     Glucose, UA NEGATIVE  NEGATIVE (mg/dL)    Hgb urine dipstick NEGATIVE  NEGATIVE     Bilirubin Urine NEGATIVE  NEGATIVE     Ketones, ur NEGATIVE  NEGATIVE (mg/dL)    Protein, ur NEGATIVE  NEGATIVE (mg/dL)    Urobilinogen, UA 1.0  0.0 - 1.0 (mg/dL)    Nitrite NEGATIVE  NEGATIVE     Leukocytes, UA NEGATIVE  NEGATIVE  MICROSCOPIC NOT DONE ON URINES WITH NEGATIVE PROTEIN, BLOOD, LEUKOCYTES, NITRITE, OR GLUCOSE <1000 mg/dL.    Radiological Exams on Admission: Ct Abdomen Pelvis Wo Contrast  03/04/2011  *RADIOLOGY REPORT*  Clinical Data: Right lower quadrant abdominal pain, nausea/vomiting, status post cholecystectomy, status post prostatectomy for prostate cancer  CT ABDOMEN AND PELVIS WITHOUT CONTRAST  Technique:  Multidetector CT imaging of the abdomen and pelvis was performed following the standard protocol without intravenous contrast.  Comparison: 01/04/2010   Findings: Lung bases are essentially clear.  Stable hepatic cysts, the largest measuring 2.2 x 2.6 cm in the left hepatic lobe.  Unenhanced spleen, pancreas, and adrenal glands within normal limits.  Status post cholecystectomy.  No intrahepatic or extrahepatic ductal dilatation.  Bilateral renal cysts, the largest measuring 5.9 x 5.9 cm in the left upper pole and 3.8 x 4.2 cm in the right interpolar kidney. Punctate nonobstructing left renal calculus (series 2/image 29). No hydronephrosis.  No evidence of bowel obstruction.  Normal appendix.  No colonic wall thickening or inflammatory changes.  Atherosclerotic calcifications of the abdominal aorta and branch vessels.  No abdominopelvic ascites.  No suspicious abdominopelvic lymphadenopathy.  Status post prostatectomy.  No ureteral or bladder calculi.  Degenerative changes of the visualized thoracolumbar spine.  No suspicious osseous lesions.  IMPRESSION: Normal appendix.  No evidence of bowel obstruction.  Punctate nonobstructing left renal calculus, unchanged.  No ureteral or bladder calculi.  No hydronephrosis.  No CT findings to account for the patient's abdominal pain.  Status post prostatectomy.  No findings to suggest metastatic disease in the abdomen/pelvis.  Original Report Authenticated By: Charline Bills, M.D.   Dg Chest 2 View  03/04/2011  *RADIOLOGY REPORT*  Clinical Data: Chest pain.  CHEST - 2 VIEW  Comparison: 01/07/2010.  Findings: The cardiac silhouette, mediastinal and hilar contours are within normal limits and stable.  Stable surgical changes from bypass surgery.  No acute pulmonary findings.  No pleural effusion or pneumothorax.  There is a rounded density at the right lung base.  I do not see this on  the prior examination but it could represent a nipple shadow.  Recommend a repeat PA chest film with nipple markers.  The bony thorax is intact.  IMPRESSION:  1.  No acute cardiopulmonary findings. 2.  Rounded density at the right lung base.   Pulmonary nodule versus nipple shadow.  Recommend repeat PA chest film with nipple markers.  Original Report Authenticated By: P. Loralie Champagne, M.D.    Assessment/Plan 1) Chest discomfort/epigastric discomfort:  At this point given history of multiple cardiac disease agree with ruling out chest pain.  Index of suspicion is low for cardiac cause but may represent angina.  Thus will plan on admitting and running three sets of cardiac enzymes.  Obtain EKG in the AM.  Will curbside cardiology and let them know patient is in house and see if they would like any further evaluation.  Will plan on providing supplemental oxygen in while ruling out.    Patient is on protonix at home for Reflux.  Will plan on continuing while here and reassessing tomorrow.  2) HTN:  At this point has ranged from 105/87 to 171/75 will plan on monitoring blood pressures and making adjustments pending blood pressure readings.  Will continue home regimen while in house.  3) GERD: Continue protonix and reevaluate may require dose adjustment or teaching when to properly take medication as outpatient.  4) CAD: will continue Plavix, imdur, aspirin has already received 325 mg while in the Ed.  5) Prostate cancer: Pt has had prostatectomy in the past CT of abdomen did not see any signs of metastasis.  Disposition:  Admit as observation.  After discussion with cards if patient has no elevated cardiac enzymes after three sets may be discharged with follow up.   Time Spent on Admission: >60 reviewing documents in chart, discussing with ED doc and Cards PA, Discussion with family, Physical Exam, Updating information services, Billing, and Documenting.  Patient has allergies to morphine, hydrocodone, and codone.  Will avoid these agents secondary to this.  Penny Pia [facility] Pager: [phone] 03/04/2011, 1:47 PM

## 2011-03-04 NOTE — ED Notes (Signed)
Pt reports woke up 3am with left sided chest pain radiating to right side. Pt reports history of same off and on for 1 week. Symptoms accompanied by nausea and pain in right leg.

## 2011-03-04 NOTE — ED Notes (Signed)
Pt drinking po contrast without problem.  Family at bedside

## 2011-03-04 NOTE — ED Notes (Signed)
Pt has no edema or Shob

## 2011-03-04 NOTE — ED Notes (Signed)
Pt has finished po contrast.  Family remains at bedside

## 2011-03-04 NOTE — ED Provider Notes (Signed)
History     CSN: [id number]  Arrival date & time 03/04/11  0720   First MD Initiated Contact with Patient 03/04/11 [phone]      Chief Complaint  Patient presents with  . Chest Pain    onset 3am   Patient is a 71 y.o. male presenting with chest pain. The history is provided by the patient.  Chest Pain The chest pain began 5 - 7 days ago (worsened significantly this morning, nausea, 3:30am). Chest pain occurs intermittently. The chest pain is worsening. At its most intense, the pain is at 8/10. The pain is currently at 2/10. The severity of the pain is mild. The quality of the pain is described as pressure-like and burning. The pain radiates to the epigastrium. Chest pain is worsened by deep breathing and eating. Primary symptoms include palpitations, abdominal pain, nausea, vomiting and dizziness. Pertinent negatives for primary symptoms include no fever, no shortness of breath, no cough and no wheezing. Primary symptoms comment: vomit this AM, as well as a few days ago  The palpitations also occurred with dizziness. The palpitations did not occur with syncope or shortness of breath.   He describes the dizziness as a sensation of spinning and lightheadedness. The dizziness began more than 1 week ago. The dizziness has been gradually worsening since its onset. Dizziness also occurs with blurred vision, tinnitus, nausea, vomiting and diaphoresis.   Associated symptoms include diaphoresis.   Recently vertigo/HA/vomiting (2 wks-12month), so went to Urgent care in [place] on Thursday.  Past Medical History  Diagnosis Date  . Hypertension   . Prostate cancer   . Pancreatitis   . Hernia     hiatel  . Coronary artery disease     Past Surgical History  Procedure Date  . Heart stents   . Cholecystectomy   . Inguinal hernia repair     bilateral    Family History  Problem Relation Age of Onset  . Heart disease Mother   . Cancer Father   . Heart disease Sister   . Heart disease Brother      History  Substance Use Topics  . Smoking status: Never Smoker   . Smokeless tobacco: Current User    Types: Chew  . Alcohol Use: No    Review of Systems  Constitutional: Positive for diaphoresis and appetite change. Negative for fever.  HENT: Positive for tinnitus.   Eyes: Positive for blurred vision and visual disturbance.  Respiratory: Positive for chest tightness. Negative for cough, shortness of breath and wheezing.   Cardiovascular: Positive for chest pain and palpitations. Negative for leg swelling.  Gastrointestinal: Positive for nausea, vomiting, abdominal pain and constipation.  Genitourinary:       Chornic dribbling/freq 2/2 prostate Ca  Neurological: Positive for dizziness and light-headedness. Negative for syncope.  Psychiatric/Behavioral: Negative for hallucinations, confusion and agitation.   Allergies  Codeine; UVO:[id number]+QVZDGLOVF+IEPPIRJJOA acid+aspartame; Morphine; Nitrofuran derivatives; Oxycodone hcl; Tramadol; and Zolpidem tartrate  Home Medications   Current Outpatient Rx  Name Route Sig Dispense Refill  . ENALAPRIL MALEATE 20 MG PO TABS Oral Take 1 tablet (20 mg total) by mouth 2 (two) times daily. 60 tablet 1    Pt must have OV for additional refills    BP 155/74  Pulse 68  Temp(Src) 97.5 F (36.4 C) (Oral)  Resp 20  SpO2 100%  Physical Exam  Vitals reviewed. Constitutional: He is oriented to person, place, and time. He appears well-developed and well-nourished. No distress.  HENT:  Mouth/Throat: No oropharyngeal  exudate.       Dry MM  Eyes: Conjunctivae are normal. Pupils are equal, round, and reactive to light.  Neck: Normal range of motion. Neck supple. No tracheal deviation present. No thyromegaly present.  Cardiovascular: Normal rate, regular rhythm, normal heart sounds and intact distal pulses.  Exam reveals no gallop and no friction rub.   No murmur heard. Pulmonary/Chest: Effort normal and breath sounds normal. No respiratory  distress. He has no wheezes. He has no rales. He exhibits no tenderness.  Abdominal: Soft. He exhibits no distension and no mass. There is tenderness. There is guarding. There is no rebound.       Tender to epigastric area, RLQ, no McBurney's point tenderness  Musculoskeletal: Normal range of motion. He exhibits no edema and no tenderness.  Neurological: He is alert and oriented to person, place, and time. No cranial nerve deficit.  Skin: Skin is warm and dry. No rash noted. He is not diaphoretic.  Psychiatric: He has a normal mood and affect. His behavior is normal.    ED Course  Procedures (including critical care time)  Labs Reviewed  CBC - Abnormal; Notable for the following:    HCT 38.2 (*)    All other components within normal limits  COMPREHENSIVE METABOLIC PANEL - Abnormal; Notable for the following:    GFR calc non Af Amer 83 (*)    All other components within normal limits  LIPASE, BLOOD - Abnormal; Notable for the following:    Lipase 75 (*)    All other components within normal limits  TROPONIN I  URINALYSIS, ROUTINE W REFLEX MICROSCOPIC   Ct Abdomen Pelvis Wo Contrast  03/04/2011  *RADIOLOGY REPORT*  Clinical Data: Right lower quadrant abdominal pain, nausea/vomiting, status post cholecystectomy, status post prostatectomy for prostate cancer  CT ABDOMEN AND PELVIS WITHOUT CONTRAST  Technique:  Multidetector CT imaging of the abdomen and pelvis was performed following the standard protocol without intravenous contrast.  Comparison: 01/04/2010  Findings: Lung bases are essentially clear.  Stable hepatic cysts, the largest measuring 2.2 x 2.6 cm in the left hepatic lobe.  Unenhanced spleen, pancreas, and adrenal glands within normal limits.  Status post cholecystectomy.  No intrahepatic or extrahepatic ductal dilatation.  Bilateral renal cysts, the largest measuring 5.9 x 5.9 cm in the left upper pole and 3.8 x 4.2 cm in the right interpolar kidney. Punctate nonobstructing left  renal calculus (series 2/image 29). No hydronephrosis.  No evidence of bowel obstruction.  Normal appendix.  No colonic wall thickening or inflammatory changes.  Atherosclerotic calcifications of the abdominal aorta and branch vessels.  No abdominopelvic ascites.  No suspicious abdominopelvic lymphadenopathy.  Status post prostatectomy.  No ureteral or bladder calculi.  Degenerative changes of the visualized thoracolumbar spine.  No suspicious osseous lesions.  IMPRESSION: Normal appendix.  No evidence of bowel obstruction.  Punctate nonobstructing left renal calculus, unchanged.  No ureteral or bladder calculi.  No hydronephrosis.  No CT findings to account for the patient's abdominal pain.  Status post prostatectomy.  No findings to suggest metastatic disease in the abdomen/pelvis.  Original Report Authenticated By: Charline Bills, M.D.   Dg Chest 2 View  03/04/2011  *RADIOLOGY REPORT*  Clinical Data: Chest pain.  CHEST - 2 VIEW  Comparison: 01/07/2010.  Findings: The cardiac silhouette, mediastinal and hilar contours are within normal limits and stable.  Stable surgical changes from bypass surgery.  No acute pulmonary findings.  No pleural effusion or pneumothorax.  There is a rounded density at  the right lung base.  I do not see this on the prior examination but it could represent a nipple shadow.  Recommend a repeat PA chest film with nipple markers.  The bony thorax is intact.  IMPRESSION:  1.  No acute cardiopulmonary findings. 2.  Rounded density at the right lung base.  Pulmonary nodule versus nipple shadow.  Recommend repeat PA chest film with nipple markers.  Original Report Authenticated By: P. Loralie Champagne, M.D.    No diagnosis found.   Date: 03/04/2011  Rate: 64 bpm   Rhythm: normal sinus rhythm  QRS Axis: indeterminate  Intervals: normal  ST/T Wave abnormalities: TWI V6, no change  Conduction Disutrbances:left bundle branch block  Narrative Interpretation:   Old EKG Reviewed: No  significant changes (11/2008)  MDM  71 yo M with H/o prostate Ca, pancreatitis, hiatel hernia, angina s/p CABG & cardiac cath with stent placement, p/w worsening acute on chronic CP with dizziness/lightheadedness & abdominal pain  --> r/o ACS, aortic dissection (though less likely), pancreatitis, appendicitis, UTI.  ekg, trop, CXR, CBC, CMET, lipase, UA, CT abd/pelvis with contrast.  Pain mgmt initiated with NTG, patient politely refuses morphine (makes him very sick/nauseated).  CT abd/pelvis negative. Lipase mildly elevated.  No acute cardiopulm finding on CXR.  Acute sharp CP resolved but still c/o chest pressure.  Pt to be admitted by hospitalist to cycle enzymes and continue to monitor for ACS given significant cardiac history.         Vernice Jefferson, MD 03/04/11 1431  Vernice Jefferson, MD 03/04/11 1432

## 2011-03-04 NOTE — ED Notes (Signed)
ekg done in triage on pt arrival

## 2011-03-04 NOTE — ED Provider Notes (Signed)
  I performed a history and physical examination of [name] [name] and discussed his management with Dr. Milbert Coulter.  I agree with the history, physical, assessment, and plan of care, with the following exceptions: None  I was present for the following procedures: None Time Spent in Critical Care of the patient: None Time spent in discussions with the patient and family: 10 min  Chest pain which is gotten progressively worse over the past 7 days. Has been intermittent. Described as pressure like and burning. It radiates to his epigastrium. Similar pain in the past where he was diagnosis with coronary artery disease as well as pancreatitis. Mild associated shortness of breath  Exam is relatively unremarkable. Heart regular rate and rhythm. Lungs are clear  Chest pain rule out. Will require admission for further evaluation and treatment. had right lower quadrant pain on examination therefore CT of the pelvis was performed which was negative.  Tildon Husky, MD 03/04/11 [phone]

## 2011-03-05 ENCOUNTER — Other Ambulatory Visit: Payer: Self-pay

## 2011-03-05 DIAGNOSIS — R079 Chest pain, unspecified: Secondary | ICD-10-CM | POA: Diagnosis not present

## 2011-03-05 DIAGNOSIS — I10 Essential (primary) hypertension: Secondary | ICD-10-CM | POA: Diagnosis not present

## 2011-03-05 DIAGNOSIS — I25810 Atherosclerosis of coronary artery bypass graft(s) without angina pectoris: Secondary | ICD-10-CM | POA: Diagnosis not present

## 2011-03-05 LAB — CARDIAC PANEL(CRET KIN+CKTOT+MB+TROPI)
CK, MB: 2 ng/mL (ref 0.3–4.0)
Relative Index: INVALID (ref 0.0–2.5)
Total CK: 33 U/L (ref 7–232)
Troponin I: <0.3 ng/mL (ref <0.30)
Troponin I: <0.3 ng/mL

## 2011-03-05 LAB — BASIC METABOLIC PANEL
BUN: 16 mg/dL (ref 6–23)
Chloride: 105 mEq/L (ref 96–112)
GFR calc Af Amer: 70 mL/min — ABNORMAL LOW (ref >90)
Potassium: 4.9 mEq/L (ref 3.5–5.1)
Sodium: 141 mEq/L (ref 135–145)

## 2011-03-05 NOTE — Discharge Summary (Signed)
Admit date: 03/04/2011 Discharge date: 03/05/2011  Primary Care Physician:  Dr. Marina Goodell    Discharge Diagnoses:   No resolved problems to display.  Active Hospital Problems  Diagnoses Date Noted   . ABDOMINAL PAIN-EPIGASTRIC 02/01/2010   . GERD 01/20/2008   . CHEST PAIN, NON-CARDIAC 01/20/2008   . HYPERTENSION, BENIGN 01/20/2008   . HYPERCHOLESTEROLEMIA, PURE 01/20/2008   . CAD, ARTERY BYPASS GRAFT 01/20/2008     Resolved Hospital Problems  Diagnoses Date Noted Date Resolved     DISCHARGE MEDICATION: Medication List  As of 03/05/2011  8:29 AM   TAKE these medications         amLODipine 5 MG tablet   Commonly known as: NORVASC   Take 2.5 mg by mouth 2 (two) times daily.      aspirin EC 81 MG tablet   Take 81 mg by mouth daily.      clopidogrel 75 MG tablet   Commonly known as: PLAVIX   Take 75 mg by mouth daily.      diphenhydrAMINE 25 MG tablet   Commonly known as: BENADRYL   Take 25 mg by mouth at bedtime. sleep      enalapril 20 MG tablet   Commonly known as: VASOTEC   Take 1 tablet (20 mg total) by mouth 2 (two) times daily.      isosorbide mononitrate 60 MG 24 hr tablet   Commonly known as: IMDUR   Take 30 mg by mouth 2 (two) times daily.      metoCLOPramide 10 MG tablet   Commonly known as: REGLAN   Take 5 mg by mouth 4 (four) times daily as needed. Stomach pain      metoprolol 50 MG tablet   Commonly known as: LOPRESSOR   Take 75 mg by mouth 2 (two) times daily.      nitroGLYCERIN 0.4 MG SL tablet   Commonly known as: NITROSTAT   Place 0.4 mg under the tongue every 5 (five) minutes as needed. Chest pain      omega-3 acid ethyl esters 1 G capsule   Commonly known as: LOVAZA   Take 2 g by mouth 2 (two) times daily.      pantoprazole 40 MG tablet   Commonly known as: PROTONIX   Take 40 mg by mouth daily.              Consults:     SIGNIFICANT DIAGNOSTIC STUDIES:  Ct Abdomen Pelvis Wo Contrast  03/04/2011  *RADIOLOGY REPORT*  Clinical Data:  Right lower quadrant abdominal pain, nausea/vomiting, status post cholecystectomy, status post prostatectomy for prostate cancer  CT ABDOMEN AND PELVIS WITHOUT CONTRAST  Technique:  Multidetector CT imaging of the abdomen and pelvis was performed following the standard protocol without intravenous contrast.  Comparison: 01/04/2010  Findings: Lung bases are essentially clear.  Stable hepatic cysts, the largest measuring 2.2 x 2.6 cm in the left hepatic lobe.  Unenhanced spleen, pancreas, and adrenal glands within normal limits.  Status post cholecystectomy.  No intrahepatic or extrahepatic ductal dilatation.  Bilateral renal cysts, the largest measuring 5.9 x 5.9 cm in the left upper pole and 3.8 x 4.2 cm in the right interpolar kidney. Punctate nonobstructing left renal calculus (series 2/image 29). No hydronephrosis.  No evidence of bowel obstruction.  Normal appendix.  No colonic wall thickening or inflammatory changes.  Atherosclerotic calcifications of the abdominal aorta and branch vessels.  No abdominopelvic ascites.  No suspicious abdominopelvic lymphadenopathy.  Status post prostatectomy.  No  ureteral or bladder calculi.  Degenerative changes of the visualized thoracolumbar spine.  No suspicious osseous lesions.  IMPRESSION: Normal appendix.  No evidence of bowel obstruction.  Punctate nonobstructing left renal calculus, unchanged.  No ureteral or bladder calculi.  No hydronephrosis.  No CT findings to account for the patient's abdominal pain.  Status post prostatectomy.  No findings to suggest metastatic disease in the abdomen/pelvis.  Original Report Authenticated By: Charline Bills, M.D.   Dg Chest 2 View  03/04/2011  *RADIOLOGY REPORT*  Clinical Data: Chest pain.  CHEST - 2 VIEW  Comparison: 01/07/2010.  Findings: The cardiac silhouette, mediastinal and hilar contours are within normal limits and stable.  Stable surgical changes from bypass surgery.  No acute pulmonary findings.  No pleural effusion  or pneumothorax.  There is a rounded density at the right lung base.  I do not see this on the prior examination but it could represent a nipple shadow.  Recommend a repeat PA chest film with nipple markers.  The bony thorax is intact.  IMPRESSION:  1.  No acute cardiopulmonary findings. 2.  Rounded density at the right lung base.  Pulmonary nodule versus nipple shadow.  Recommend repeat PA chest film with nipple markers.  Original Report Authenticated By: P. Loralie Champagne, M.D.     ECHO:none this admission      CARDIAC CATH & OTHER PROCEDURES: Patient had 3 sets of cardiac enzymes that came back negative  No results found for this or any previous visit (from the past 240 hour(s)).  BRIEF ADMITTING H & P: Pt is a 71 y/o caucasian male with history of Reglux, CAD s/p prior CABG as well as a history of stents. Patient is know to have chronic chest pain and is presenting to the ED complaining of chest discomfort. States that it is a pressure like sensation that is at his upper left chest and radiates to his abdomen and turns into a burning sensation.  Given patient's cardiac history he was admitted for r/o ACS.  Had three sets of cardiac enzymes that were negative.  Given history and physical feel that his chest discomfort was most likely due to uncontrolled reflux.  I have discussed and recommended that he take his protonix 30 minutes prior to meals and for him not to lay down right after meals.  He has also been perscribed reglan by his PCP for this as well.  Pt was able to tolerate po intake while in house.   No resolved problems to display.  Active Hospital Problems  Diagnoses Date Noted   . ABDOMINAL PAIN-EPIGASTRIC 02/01/2010   . GERD 01/20/2008   . CHEST PAIN, NON-CARDIAC 01/20/2008   . HYPERTENSION, BENIGN 01/20/2008   . HYPERCHOLESTEROLEMIA, PURE 01/20/2008   . CAD, ARTERY BYPASS GRAFT 01/20/2008     Resolved Hospital Problems  Diagnoses Date Noted Date Resolved     Disposition  and Follow-up:  As indicated below. Discharge Orders    Future Orders Please Complete By Expires   Diet - low sodium heart healthy      Increase activity slowly      Discharge instructions      Comments:   Take protonix 30 minutes prior to eating and do not lay down after eating.  Follow up with GI doctor in 1-2 weeks.   Call MD for:  persistant nausea and vomiting      Call MD for:  severe uncontrolled pain           DISCHARGE  EXAM:  General: Alert, awake, oriented x3, in no acute distress. Sitting up in bed smiling. HEENT: atraumatic, normocephalic, EOMI, PERRLA, normal exterior ears and nose, neck supple BL bruits L > R, no goiter. Heart: Regular rate and rhythm, without murmurs, rubs, gallops. Lungs: Clear to auscultation bilaterally. Abdomen: Soft, nontender, nondistended, positive bowel sounds. Extremities: No clubbing cyanosis or edema with positive pedal pulses. Neuro: Grossly intact, nonfocal. Skin: non diaphoretic, no obvious rashes    Blood pressure 119/69, pulse 62, temperature 97.9 F (36.6 C), temperature source Oral, resp. rate 17, height 5' 3.98" (1.625 m), weight 62.324 kg (137 lb 6.4 oz), SpO2 98.00%.   Basename 03/04/11 1603 03/04/11 0815  NA -- 138  K -- 3.7  CL -- 102  CO2 -- 25  GLUCOSE -- 94  BUN -- 15  CREATININE 1.02 0.94  CALCIUM -- 9.2  MG -- --  PHOS -- --    Basename 03/04/11 0815  AST 12  ALT 12  ALKPHOS 42  BILITOT 0.9  PROT 6.4  ALBUMIN 3.6    Basename 03/04/11 0815  LIPASE 75*  AMYLASE --    Basename 03/04/11 1603 03/04/11 0815  WBC 7.1 6.9  NEUTROABS -- --  HGB 13.9 13.2  HCT 40.6 38.2*  MCV 86.4 85.5  PLT 166 157    Signed: Penny Pia M.D. 03/05/2011, 8:29 AM

## 2011-03-05 NOTE — Progress Notes (Signed)
Pt. Discharged 03/05/2011  12:27 PM Discharge instructions reviewed with patient/family. Patient/family verbalized understanding. All Rx's given. Questions answered as needed. Pt. Discharged to home with family/self. Taken off unit via W/C. [name] [name]

## 2011-03-06 NOTE — Progress Notes (Signed)
Utilization review complete 

## 2011-04-05 ENCOUNTER — Other Ambulatory Visit: Payer: Self-pay | Admitting: Nurse Practitioner

## 2011-04-05 DIAGNOSIS — I771 Stricture of artery: Secondary | ICD-10-CM | POA: Diagnosis not present

## 2011-04-06 ENCOUNTER — Telehealth: Payer: Self-pay | Admitting: Cardiology

## 2011-04-06 NOTE — Telephone Encounter (Signed)
New msg Pt's wife said he went to urgent care last night. She said he was confused and couldn't remember names. She was told to call here today. Please call her back

## 2011-04-06 NOTE — Telephone Encounter (Signed)
Pt seen in urgent care last eve/ [place]. Pt States: I was sitting in chair and " everything went all glimmery and had loss of memory ". Pt states his bp was high at urgent care 180/?// cant remember, no ct scans were done and pt feeling better today but stated he has been out of plavix and has not taken for 5 days. Pt has known carotid disease, no availability today told to go to an ER with further possible problems, appointment made tomorrow with Norma Fredrickson np.

## 2011-04-07 ENCOUNTER — Ambulatory Visit (INDEPENDENT_AMBULATORY_CARE_PROVIDER_SITE_OTHER): Payer: Medicare Other | Admitting: Nurse Practitioner

## 2011-04-07 ENCOUNTER — Ambulatory Visit (INDEPENDENT_AMBULATORY_CARE_PROVIDER_SITE_OTHER)
Admission: RE | Admit: 2011-04-07 | Discharge: 2011-04-07 | Disposition: A | Discharge status: Home / Self Care | Payer: Medicare Other | Source: Ambulatory Visit | Attending: Nurse Practitioner | Admitting: Nurse Practitioner

## 2011-04-07 ENCOUNTER — Encounter: Payer: Self-pay | Admitting: Nurse Practitioner

## 2011-04-07 ENCOUNTER — Other Ambulatory Visit: Payer: Self-pay | Admitting: *Deleted

## 2011-04-07 VITALS — BP 128/78 | HR 68 | Ht 64.0 in | Wt 142.0 lb

## 2011-04-07 DIAGNOSIS — R413 Other amnesia: Secondary | ICD-10-CM

## 2011-04-07 DIAGNOSIS — R55 Syncope and collapse: Secondary | ICD-10-CM

## 2011-04-07 DIAGNOSIS — I10 Essential (primary) hypertension: Secondary | ICD-10-CM

## 2011-04-07 DIAGNOSIS — R0789 Other chest pain: Secondary | ICD-10-CM

## 2011-04-07 DIAGNOSIS — I779 Disorder of arteries and arterioles, unspecified: Secondary | ICD-10-CM | POA: Diagnosis not present

## 2011-04-07 DIAGNOSIS — Z8546 Personal history of malignant neoplasm of prostate: Secondary | ICD-10-CM | POA: Diagnosis not present

## 2011-04-07 DIAGNOSIS — R51 Headache: Secondary | ICD-10-CM | POA: Diagnosis not present

## 2011-04-07 LAB — BASIC METABOLIC PANEL
BUN: 19 mg/dL (ref 6–23)
CO2: 27 mEq/L (ref 19–32)
Calcium: 9.3 mg/dL (ref 8.4–10.5)
Chloride: 106 mEq/L (ref 96–112)
Creat: 1.26 mg/dL (ref 0.50–1.35)
Glucose, Bld: 90 mg/dL (ref 70–99)
Potassium: 5.3 mEq/L (ref 3.5–5.3)
Sodium: 143 mEq/L (ref 135–145)

## 2011-04-07 LAB — CBC WITH DIFFERENTIAL/PLATELET
Basophils Absolute: 0 10*3/uL (ref 0.0–0.1)
Basophils Relative: 0 % (ref 0–1)
Eosinophils Absolute: 0.1 10*3/uL (ref 0.0–0.7)
Eosinophils Relative: 1 % (ref 0–5)
HCT: 40.2 % (ref 39.0–52.0)
Hemoglobin: 13.8 g/dL (ref 13.0–17.0)
Lymphocytes Relative: 30 % (ref 12–46)
Lymphs Abs: 1.8 10*3/uL (ref 0.7–4.0)
MCH: 30.4 pg (ref 26.0–34.0)
MCHC: 34.3 g/dL (ref 30.0–36.0)
MCV: 88.5 fL (ref 78.0–100.0)
Monocytes Absolute: 0.5 10*3/uL (ref 0.1–1.0)
Monocytes Relative: 8 % (ref 3–12)
Neutro Abs: 3.7 10*3/uL (ref 1.7–7.7)
Neutrophils Relative %: 61 % (ref 43–77)
Platelets: 201 10*3/uL (ref 150–400)
RBC: 4.54 MIL/uL (ref 4.22–5.81)
RDW: 14.1 % (ref 11.5–15.5)
WBC: 6.1 10*3/uL (ref 4.0–10.5)

## 2011-04-07 MED ORDER — CLOPIDOGREL BISULFATE 75 MG PO TABS: 75.0000 mg | ORAL_TABLET | Freq: Every day | ORAL | Status: DC

## 2011-04-07 NOTE — Assessment & Plan Note (Signed)
Patient has chronic chest pain. Did have an admission last month. Negative enzymes. CXR was abnormal. Needs a repeat with nipple markers. We will obtain.

## 2011-04-07 NOTE — Assessment & Plan Note (Signed)
Blood pressure not too bad here today. I have left him on his current regimen.

## 2011-04-07 NOTE — Progress Notes (Signed)
[name] [name] Date of Birth: Aug 11, 1940 Medical Record #[id number]  History of Present Illness: [name] [name] is seen today for a work in visit. He is seen for Dr. Jens Som. He has multiple medical issues which include CAD, remote CABG, prior stenting of the left main, RCA and LCX. His last cath was in December of 2011. He had a 90% stenosis at the bifurcation of AV circumflex/OM and had DES to the LCX. His EF remains normal. His other issues include carotid disease - last doppler in 2009. Has HTN, GERD and hyperlipidemia. He does not tolerate statins.  He comes in today. He says he is here because he lost his memory yesterday while watching TV. He says he saw some lines moving in front of his eyes and then just doesn't remember. He has had several of these spells. Feels like his "body is shutting down". He has not been taking his Plavix regularly. He thought this might be causing his symptoms. He has used some of his wife's Plavix because his refill was denied. He has not been seen in over a year. He has a chronic chest pain syndrome. This is no different per his report but he was admitted overnight last month with chest pain. He had negative enzymes. Did have an abnormality on his CXR which needs to get repeated. He is not very active as a general rule.  Not short of breath. Went to the Urgent Care yesterday. No labs. Blood pressure was up a little bit. He feels fine today. No other neurologic symptoms reported.   Current Outpatient Prescriptions on File Prior to Visit  Medication Sig Dispense Refill  . amLODipine (NORVASC) 5 MG tablet Take 2.5 mg by mouth 2 (two) times daily.      Marland Kitchen aspirin EC 81 MG tablet Take 81 mg by mouth daily.      . diphenhydrAMINE (BENADRYL) 25 MG tablet Take 25 mg by mouth at bedtime. sleep      . enalapril (VASOTEC) 20 MG tablet Take 1 tablet (20 mg total) by mouth 2 (two) times daily.  60 tablet  1  . isosorbide mononitrate (IMDUR) 60 MG 24 hr tablet TAKE 1/2 TABLET  TWICE DAILY  30 tablet  0  . metoCLOPramide (REGLAN) 10 MG tablet Take 5 mg by mouth 4 (four) times daily as needed. Stomach pain      . metoprolol (LOPRESSOR) 50 MG tablet Take 75 mg by mouth 2 (two) times daily.      . nitroGLYCERIN (NITROSTAT) 0.4 MG SL tablet Place 0.4 mg under the tongue every 5 (five) minutes as needed. Chest pain      . omega-3 acid ethyl esters (LOVAZA) 1 G capsule Take 2 g by mouth 2 (two) times daily.      . pantoprazole (PROTONIX) 40 MG tablet Take 40 mg by mouth daily.        Allergies  Allergen Reactions  . Codeine   . Hydrocodone   . WUJ:[id number]+AOZHYQMVH+QIONGEXBMW Acid+Aspartame     REACTION: 'Burns' Stomach  . Morphine   . Nitrofuran Derivatives   . Other     Mycins  . Oxycodone Hcl   . Shellfish Allergy   . Tramadol   . Zolpidem Tartrate     Past Medical History  Diagnosis Date  . Hypertension   . Prostate cancer   . Pancreatitis Dec 2011    ERCP ok.  . Hiatal hernia   . Coronary artery disease     s/p CABG in  2001, s/p DES to left main in the past as well as stenting of the LCX and RCA. Negative stress test in December 2011 but continued to have pain. S/P repeat cath showing patent radial graft to the DX with total occlusion of the distal limb to the LCX and a patent LIMA to LAD. Stentsin the left main, LCX & RCA were patent but did have DES to the LCX for high grade bifurcational AV/OM   . Chronic chest pain   . Left bundle branch block   . Carotid disease, bilateral   . GERD (gastroesophageal reflux disease)   . Hyperlipidemia   . Inguinal hernia   . Statin intolerance   . Anxiety disorder     Past Surgical History  Procedure Date  . Heart stents   . Cholecystectomy   . Inguinal hernia repair     bilateral  . Coronary artery bypass graft 2001    History  Smoking status  . Never Smoker   Smokeless tobacco  . Current User  . Types: Chew    History  Alcohol Use No    Family History  Problem Relation Age of Onset    . Heart disease Mother   . Cancer Father   . Heart disease Sister   . Heart disease Brother     Review of Systems: The review of systems is per the HPI.  All other systems were reviewed and are negative.  Physical Exam: BP 128/78  Pulse 68  Ht 5\' 4"  (1.626 m)  Wt 142 lb (64.411 kg)  BMI 24.37 kg/m2 Patient is pleasant and in no acute distress. Skin is warm and dry. Color is normal.  HEENT is unremarkable except for bilateral carotid bruits, left greater than right. Normocephalic/atraumatic. PERRL. Sclera are nonicteric. Neck is supple. No masses. No JVD. Lungs are clear. Cardiac exam shows a regular rate and rhythm. Abdomen is soft. Extremities are without edema. Gait and ROM are intact. No gross neurologic deficits noted.  LABORATORY DATA: PENDING   Assessment / Plan:

## 2011-04-07 NOTE — Telephone Encounter (Signed)
Error - wrong pt

## 2011-04-07 NOTE — Assessment & Plan Note (Signed)
He presents with recurrent episodes of memory loss and some visual changes. Has known carotid disease. Has been off of his Plavix. I have refilled the Plavix today. We are going to arrange for repeat carotid dopplers. His last study was in 2009 and showed 0-39% on the right and 40-59% on the left. We will also arrange for a CT of the head without contrast. Further disposition to follow. Patient is agreeable to this plan and will call if any problems develop in the interim.

## 2011-04-07 NOTE — Patient Instructions (Addendum)
We are going to arrange for a carotid doppler study.  I have refilled your Plavix. Take this every day along with your aspirin  Stay on your other medicines.  We are also going to arrange for a scan on your head.  Go and get your repeat chest xray  Call the [facility] office at [phone] if you have any questions, problems or concerns.

## 2011-04-08 LAB — TSH: TSH: 1.826 u[IU]/mL (ref 0.350–4.500)

## 2011-04-10 ENCOUNTER — Encounter (INDEPENDENT_AMBULATORY_CARE_PROVIDER_SITE_OTHER): Payer: Medicare Other

## 2011-04-10 ENCOUNTER — Encounter: Payer: Self-pay | Admitting: Vascular Surgery

## 2011-04-10 ENCOUNTER — Other Ambulatory Visit: Payer: Medicare Other

## 2011-04-10 DIAGNOSIS — I6529 Occlusion and stenosis of unspecified carotid artery: Secondary | ICD-10-CM | POA: Diagnosis not present

## 2011-04-10 DIAGNOSIS — I779 Disorder of arteries and arterioles, unspecified: Secondary | ICD-10-CM

## 2011-04-10 DIAGNOSIS — H53129 Transient visual loss, unspecified eye: Secondary | ICD-10-CM

## 2011-04-10 DIAGNOSIS — R55 Syncope and collapse: Secondary | ICD-10-CM

## 2011-04-10 DIAGNOSIS — R413 Other amnesia: Secondary | ICD-10-CM

## 2011-04-10 HISTORY — DX: Occlusion and stenosis of unspecified carotid artery: I65.29

## 2011-04-11 ENCOUNTER — Ambulatory Visit (INDEPENDENT_AMBULATORY_CARE_PROVIDER_SITE_OTHER): Payer: Medicare Other | Admitting: Vascular Surgery

## 2011-04-11 ENCOUNTER — Encounter: Payer: Self-pay | Admitting: Vascular Surgery

## 2011-04-11 VITALS — BP 166/68 | HR 69 | Resp 16 | Ht 64.0 in | Wt 142.0 lb

## 2011-04-11 DIAGNOSIS — I6529 Occlusion and stenosis of unspecified carotid artery: Secondary | ICD-10-CM | POA: Diagnosis not present

## 2011-04-11 NOTE — Progress Notes (Signed)
Subjective:     Patient ID: [name] [name], male   DOB: 11/25/1940, 70 y.o.   MRN: [id number]  HPI this 70-year-old male patient was referred by Dr. Crenshaw for severe carotid occlusive disease. Last week the patient experienced visual scotomata but no blindness in either eye. He thinks it involved both eyes. He then developed acute loss of short-term memory being unable to remember his wife's name. This all resolved within one hour. He had no lateralizing weakness, aphasia, facial asymmetry, or syncope. He had no previous history of stroke or TIA. He does have a history of coronary artery disease with coronary artery bypass grafting performed in 2002 and PTCA and stenting a few years ago. He is on Plavix and aspirin. He does have frequent chest discomfort which is a chronic finding.  Past Medical History  Diagnosis Date  . Hypertension   . Prostate cancer   . Pancreatitis Dec 2011    ERCP ok.  . Hiatal hernia   . Coronary artery disease     s/p CABG in 2001, s/p DES to left main in the past as well as stenting of the LCX and RCA. Negative stress test in December 2011 but continued to have pain. S/P repeat cath showing patent radial graft to the DX with total occlusion of the distal limb to the LCX and a patent LIMA to LAD. Stentsin the left main, LCX & RCA were patent but did have DES to the LCX for high grade bifurcational AV/OM   . Chronic chest pain   . Left bundle branch block   . Carotid disease, bilateral   . GERD (gastroesophageal reflux disease)   . Hyperlipidemia   . Inguinal hernia   . Statin intolerance   . Anxiety disorder   . Peripheral vascular disease 04/10/11    History  Substance Use Topics  . Smoking status: Never Smoker   . Smokeless tobacco: Current User    Types: Chew  . Alcohol Use: No    Family History  Problem Relation Age of Onset  . Heart disease Mother     Heart Disease before age 60  . Hypertension Mother   . Cancer Father   . Hypertension Sister     . Heart disease Sister     Heart Disease before age 60  . Hypertension Brother   . Heart disease Brother     Heart Disease before age 60  . Heart attack Brother     Allergies  Allergen Reactions  . Codeine   . Hydrocodone   . Kdc:Amoxicillin+Saccharin+Clavulanic Acid+Aspartame     REACTION: 'Burns' Stomach  . Morphine   . Nitrofuran Derivatives   . Other     Mycins  . Oxycodone Hcl   . Shellfish Allergy   . Tramadol   . Zolpidem Tartrate     Current outpatient prescriptions:amLODipine (NORVASC) 5 MG tablet, Take 2.5 mg by mouth 2 (two) times daily., Disp: , Rfl: ;  aspirin EC 81 MG tablet, Take 81 mg by mouth daily., Disp: , Rfl: ;  clopidogrel (PLAVIX) 75 MG tablet, Take 1 tablet (75 mg total) by mouth daily., Disp: 30 tablet, Rfl: 11;  diphenhydrAMINE (BENADRYL) 25 MG tablet, Take 25 mg by mouth at bedtime. sleep, Disp: , Rfl:  enalapril (VASOTEC) 20 MG tablet, Take 1 tablet (20 mg total) by mouth 2 (two) times daily., Disp: 60 tablet, Rfl: 1;  isosorbide mononitrate (IMDUR) 60 MG 24 hr tablet, TAKE 1/2 TABLET TWICE DAILY, Disp: 30 tablet, Rfl:   0;  metoCLOPramide (REGLAN) 10 MG tablet, Take 5 mg by mouth 4 (four) times daily as needed. Stomach pain, Disp: , Rfl: ;  metoprolol (LOPRESSOR) 50 MG tablet, Take 75 mg by mouth 2 (two) times daily., Disp: , Rfl:  nitroGLYCERIN (NITROSTAT) 0.4 MG SL tablet, Place 0.4 mg under the tongue every 5 (five) minutes as needed. Chest pain, Disp: , Rfl: ;  omega-3 acid ethyl esters (LOVAZA) 1 G capsule, Take 2 g by mouth 2 (two) times daily., Disp: , Rfl: ;  pantoprazole (PROTONIX) 40 MG tablet, Take 40 mg by mouth daily., Disp: , Rfl:   BP 166/68  Pulse 69  Resp 16  Ht 5' 4" (1.626 m)  Wt 142 lb (64.411 kg)  BMI 24.37 kg/m2  SpO2 99%  Body mass index is 24.37 kg/(m^2).            Review of Systems has mild daily chest discomfort, dyspnea on exertion but denies PND or orthopnea. No hemoptysis. No chronic cough. Denies claudication  symptoms in lower extremities. Other systems are negative and complete review of systems    Objective:   Physical Exam blood pressure 160/68 heart rate 69 respirations 16 Gen.-alert and oriented x3 in no apparent distress HEENT normal for age Lungs no rhonchi or wheezing Cardiovascular regular rhythm no murmurs carotid pulses 3+ palpable-harsh bruit on the left no bruit on the right Abdomen soft nontender no palpable masses Musculoskeletal free of  major deformities Skin clear -no rashes Neurologic normal Lower extremities 3+ femoral and 3+ right PT and 2+ left DP  Today I reviewed the carotid duplex exam performed by Dr. Crenshaw's office. Peak systolic velocity of the, left internal carotid is 539 cm/s with end-diastolic velocity of 113 cm per sec this is consistent with 95% left internal carotid stenosis and mild right internal carotid stenosis.      Assessment:     Severe left internal carotid stenosis with likely TIA 5 days ago no recurrence    Plan:     Discussed situation with Dr. Crenshaw who will perform Myoview nuclear medicine scan this week and if abnormal he will then see patient Plan left carotid endarterectomy next week March 27 risks  and benefits including stroke and/or death were discussed with patient and wife and they understand and agree to proceed Will discontinue Plavix today as per Dr. Crenshaw's recommendation and will continue aspirin      

## 2011-04-12 ENCOUNTER — Encounter (HOSPITAL_COMMUNITY): Payer: Self-pay | Admitting: *Deleted

## 2011-04-12 ENCOUNTER — Other Ambulatory Visit: Payer: Self-pay | Admitting: *Deleted

## 2011-04-12 ENCOUNTER — Telehealth: Payer: Self-pay

## 2011-04-12 ENCOUNTER — Emergency Department (HOSPITAL_COMMUNITY): Payer: Medicare Other

## 2011-04-12 ENCOUNTER — Other Ambulatory Visit: Payer: Self-pay

## 2011-04-12 ENCOUNTER — Telehealth: Payer: Self-pay | Admitting: *Deleted

## 2011-04-12 ENCOUNTER — Inpatient Hospital Stay (HOSPITAL_COMMUNITY)
Admission: EM | Admit: 2011-04-12 | Discharge: 2011-04-15 | DRG: 254 | Disposition: A | Discharge status: Home / Self Care | Payer: Medicare Other | Attending: Cardiology | Admitting: Cardiology

## 2011-04-12 ENCOUNTER — Encounter: Payer: Self-pay | Admitting: *Deleted

## 2011-04-12 DIAGNOSIS — I2510 Atherosclerotic heart disease of native coronary artery without angina pectoris: Secondary | ICD-10-CM

## 2011-04-12 DIAGNOSIS — Z7982 Long term (current) use of aspirin: Secondary | ICD-10-CM

## 2011-04-12 DIAGNOSIS — Z9861 Coronary angioplasty status: Secondary | ICD-10-CM

## 2011-04-12 DIAGNOSIS — Z01810 Encounter for preprocedural cardiovascular examination: Secondary | ICD-10-CM

## 2011-04-12 DIAGNOSIS — I252 Old myocardial infarction: Secondary | ICD-10-CM

## 2011-04-12 DIAGNOSIS — Z7902 Long term (current) use of antithrombotics/antiplatelets: Secondary | ICD-10-CM

## 2011-04-12 DIAGNOSIS — I10 Essential (primary) hypertension: Secondary | ICD-10-CM | POA: Diagnosis present

## 2011-04-12 DIAGNOSIS — R079 Chest pain, unspecified: Secondary | ICD-10-CM

## 2011-04-12 DIAGNOSIS — Z888 Allergy status to other drugs, medicaments and biological substances status: Secondary | ICD-10-CM

## 2011-04-12 DIAGNOSIS — K219 Gastro-esophageal reflux disease without esophagitis: Secondary | ICD-10-CM | POA: Diagnosis present

## 2011-04-12 DIAGNOSIS — F17200 Nicotine dependence, unspecified, uncomplicated: Secondary | ICD-10-CM | POA: Diagnosis present

## 2011-04-12 DIAGNOSIS — F411 Generalized anxiety disorder: Secondary | ICD-10-CM | POA: Diagnosis present

## 2011-04-12 DIAGNOSIS — Z79899 Other long term (current) drug therapy: Secondary | ICD-10-CM

## 2011-04-12 DIAGNOSIS — R0789 Other chest pain: Principal | ICD-10-CM | POA: Diagnosis present

## 2011-04-12 DIAGNOSIS — E785 Hyperlipidemia, unspecified: Secondary | ICD-10-CM | POA: Diagnosis present

## 2011-04-12 DIAGNOSIS — Z8546 Personal history of malignant neoplasm of prostate: Secondary | ICD-10-CM

## 2011-04-12 DIAGNOSIS — R0602 Shortness of breath: Secondary | ICD-10-CM | POA: Diagnosis not present

## 2011-04-12 DIAGNOSIS — I739 Peripheral vascular disease, unspecified: Secondary | ICD-10-CM | POA: Diagnosis present

## 2011-04-12 DIAGNOSIS — Z951 Presence of aortocoronary bypass graft: Secondary | ICD-10-CM

## 2011-04-12 DIAGNOSIS — I6529 Occlusion and stenosis of unspecified carotid artery: Secondary | ICD-10-CM | POA: Diagnosis present

## 2011-04-12 DIAGNOSIS — Z8249 Family history of ischemic heart disease and other diseases of the circulatory system: Secondary | ICD-10-CM

## 2011-04-12 DIAGNOSIS — E7800 Pure hypercholesterolemia, unspecified: Secondary | ICD-10-CM | POA: Diagnosis present

## 2011-04-12 DIAGNOSIS — C61 Malignant neoplasm of prostate: Secondary | ICD-10-CM

## 2011-04-12 LAB — CBC
HCT: 39.8 % (ref 39.0–52.0)
HCT: 35.8 % — ABNORMAL LOW (ref 39.0–52.0)
Hemoglobin: 13.6 g/dL (ref 13.0–17.0)
Hemoglobin: 12.9 g/dL — ABNORMAL LOW (ref 13.0–17.0)
MCH: 29.9 pg (ref 26.0–34.0)
MCH: 30.8 pg (ref 26.0–34.0)
MCHC: 34.2 g/dL (ref 30.0–36.0)
MCHC: 36 g/dL (ref 30.0–36.0)
MCV: 87.5 fL (ref 78.0–100.0)
MCV: 85.4 fL (ref 78.0–100.0)
Platelets: 181 K/uL (ref 150–400)
Platelets: 162 K/uL (ref 150–400)
RBC: 4.55 MIL/uL (ref 4.22–5.81)
RBC: 4.19 MIL/uL — ABNORMAL LOW (ref 4.22–5.81)
RDW: 13.9 % (ref 11.5–15.5)
RDW: 13.6 % (ref 11.5–15.5)
WBC: 5.1 K/uL (ref 4.0–10.5)
WBC: 6.1 K/uL (ref 4.0–10.5)

## 2011-04-12 LAB — BASIC METABOLIC PANEL WITH GFR
BUN: 14 mg/dL (ref 6–23)
CO2: 25 meq/L (ref 19–32)
Calcium: 9.7 mg/dL (ref 8.4–10.5)
Chloride: 102 meq/L (ref 96–112)
Creatinine, Ser: 1.03 mg/dL (ref 0.50–1.35)
GFR calc Af Amer: 83 mL/min — ABNORMAL LOW
GFR calc non Af Amer: 72 mL/min — ABNORMAL LOW
Glucose, Bld: 83 mg/dL (ref 70–99)
Potassium: 3.8 meq/L (ref 3.5–5.1)
Sodium: 143 meq/L (ref 135–145)

## 2011-04-12 LAB — POCT I-STAT, CHEM 8
BUN: 16 mg/dL (ref 6–23)
Calcium, Ion: 1.2 mmol/L (ref 1.12–1.32)
Chloride: 107 meq/L (ref 96–112)
Creatinine, Ser: 1.2 mg/dL (ref 0.50–1.35)
Glucose, Bld: 93 mg/dL (ref 70–99)
HCT: 43 % (ref 39.0–52.0)
Hemoglobin: 14.6 g/dL (ref 13.0–17.0)
Potassium: 4 meq/L (ref 3.5–5.1)
Sodium: 144 meq/L (ref 135–145)
TCO2: 26 mmol/L (ref 0–100)

## 2011-04-12 LAB — COMPREHENSIVE METABOLIC PANEL WITH GFR
ALT: 11 U/L (ref 0–53)
AST: 16 U/L (ref 0–37)
Albumin: 3.6 g/dL (ref 3.5–5.2)
Alkaline Phosphatase: 43 U/L (ref 39–117)
BUN: 13 mg/dL (ref 6–23)
CO2: 26 meq/L (ref 19–32)
Calcium: 9 mg/dL (ref 8.4–10.5)
Chloride: 103 meq/L (ref 96–112)
Creatinine, Ser: 1.01 mg/dL (ref 0.50–1.35)
GFR calc Af Amer: 85 mL/min — ABNORMAL LOW
GFR calc non Af Amer: 73 mL/min — ABNORMAL LOW
Glucose, Bld: 116 mg/dL — ABNORMAL HIGH (ref 70–99)
Potassium: 3.6 meq/L (ref 3.5–5.1)
Sodium: 141 meq/L (ref 135–145)
Total Bilirubin: 1.6 mg/dL — ABNORMAL HIGH (ref 0.3–1.2)
Total Protein: 6.2 g/dL (ref 6.0–8.3)

## 2011-04-12 LAB — DIFFERENTIAL
Basophils Absolute: 0 K/uL (ref 0.0–0.1)
Basophils Relative: 0 % (ref 0–1)
Eosinophils Absolute: 0 K/uL (ref 0.0–0.7)
Eosinophils Relative: 1 % (ref 0–5)
Lymphocytes Relative: 28 % (ref 12–46)
Lymphs Abs: 1.7 K/uL (ref 0.7–4.0)
Monocytes Absolute: 0.5 K/uL (ref 0.1–1.0)
Monocytes Relative: 8 % (ref 3–12)
Neutro Abs: 3.9 K/uL (ref 1.7–7.7)
Neutrophils Relative %: 63 % (ref 43–77)

## 2011-04-12 LAB — POCT I-STAT TROPONIN I
Troponin i, poc: 0 ng/mL (ref 0.00–0.08)
Troponin i, poc: 0.01 ng/mL (ref 0.00–0.08)

## 2011-04-12 LAB — PROTIME-INR
INR: 0.98 (ref 0.00–1.49)
Prothrombin Time: 13.2 seconds (ref 11.6–15.2)

## 2011-04-12 MED ORDER — SODIUM CHLORIDE 0.9 % IV SOLN
INTRAVENOUS | Status: DC
  Administered 2011-04-12: 23:00:00 via INTRAVENOUS

## 2011-04-12 MED ORDER — ASPIRIN 325 MG PO TABS
325.0000 mg | ORAL_TABLET | ORAL | Status: AC
  Administered 2011-04-12: 325 mg via ORAL
  Filled 2011-04-12: qty 1

## 2011-04-12 NOTE — ED Notes (Signed)
Pt given decaf coffee and [place] sandwich, heart healthy diet per orders by Dr. Ignacia Palma

## 2011-04-12 NOTE — Telephone Encounter (Signed)
Mr Hagy's daughter called reporting that Mr Macera is having active chest pain, radiating down his left arm.  He is also have sob and shaking.  I recommended that he call 9-1-1 and have them take him to the ER.

## 2011-04-12 NOTE — Telephone Encounter (Signed)
Pt is to have carotid surgery with [facility] next week and per dr Jens Som the pt will need lexiscan myoview for clearance. Will contact pt to schedule.

## 2011-04-12 NOTE — ED Notes (Addendum)
Pt reports left sided chest pain that has been constant x a week, states the pain will intermittently get worse. Pt reports mild sob. Pt states he had severe chest pain approx a week ago and states he has the pain since but not as bad. Pt states that he came in today because he is supposed to have surgery on his neck in a couple days. Pt states that he was more sob today.

## 2011-04-12 NOTE — H&P (Signed)
Cardiology History and Physical  PCP: Ailene Ravel, MD, MD  History of Present Illness (and review of medical records): [name] [name] is a 71 y.o. male who presents for evaluation of chest pain.  He has known cardiac history as noted below.  Of note patient had recent TIA with further evaluation revealing severe left ICA stenosis and mild right ICA stenosis.  He is planned for left CEA on March 27th with Dr. Hart Rochester.  He presents to ED today for further evaluation of chest pain.  Patient stated approx 2 weeks ago he was loading heavy items onto truck and felt severe chest pain.  He had to take 3 NTG for relief at that time.  Since then he has had mild intermittent chest pain worse with exertion.  Pain associated with shortness of breath only.  He unable to different this pain from chronic prior chest pain associated with GERD/Hiatal hernia.  He took 2 NTG today after daughter called office prior to coming in to ED for further evaluation.   Review of Systems Further review of systems was otherwise negative other than stated in HPI.  Patient Active Problem List  Diagnoses Date Noted  . Chest pain 04/12/2011  . Memory loss 04/07/2011  . ANXIETY 02/01/2010  . ABDOMINAL PAIN-EPIGASTRIC 02/01/2010  . ADENOCARCINOMA, PROSTATE 12/22/2008  . DYSPHAGIA UNSPECIFIED 11/18/2008  . COLONIC POLYPS, HX OF 11/18/2008  . HYPERCHOLESTEROLEMIA, PURE 01/20/2008  . HYPERTENSION, BENIGN 01/20/2008  . CAD, ARTERY BYPASS GRAFT 01/20/2008  . LBBB 01/20/2008  . CAROTID ARTERY STENOSIS, WITHOUT INFARCTION 01/20/2008  . GERD 01/20/2008  . CHEST PAIN, NON-CARDIAC 01/20/2008   Past Medical History  Diagnosis Date  . Hypertension   . Prostate cancer   . Pancreatitis Dec 2011    ERCP ok.  . Hiatal hernia   . Coronary artery disease     s/p CABG in 2001, s/p DES to left main in the past as well as stenting of the LCX and RCA. Negative stress test in December 2011 but continued to have pain. S/P repeat cath  showing patent radial graft to the DX with total occlusion of the distal limb to the LCX and a patent LIMA to LAD. Stentsin the left main, LCX & RCA were patent but did have DES to the LCX for high grade bifurcational AV/OM   . Chronic chest pain   . Left bundle branch block   . Carotid disease, bilateral   . GERD (gastroesophageal reflux disease)   . Hyperlipidemia   . Inguinal hernia   . Statin intolerance   . Anxiety disorder   . Peripheral vascular disease 04/10/11    Past Surgical History  Procedure Date  . Heart stents   . Cholecystectomy   . Inguinal hernia repair     bilateral  . Coronary artery bypass graft 2001     (Not in a hospital admission) Allergies  Allergen Reactions  . Codeine   . Hydrocodone   . JYN:WGNFAOZHYQM+VHQIONGEX+BMWUXLKGMW Acid+Aspartame     REACTION: 'Burns' Stomach  . Morphine   . Nitrofuran Derivatives   . Other     Mycins  . Oxycodone Hcl   . Shellfish Allergy   . Tramadol   . Zolpidem Tartrate     History  Substance Use Topics  . Smoking status: Never Smoker   . Smokeless tobacco: Current User    Types: Chew  . Alcohol Use: No    Family History  Problem Relation Age of Onset  . Heart disease Mother  Heart Disease before age 70  . Hypertension Mother   . Cancer Father   . Hypertension Sister   . Heart disease Sister     Heart Disease before age 30  . Hypertension Brother   . Heart disease Brother     Heart Disease before age 59  . Heart attack Brother      Objective: Patient Vitals for the past 8 hrs:  BP Temp Temp src Pulse Resp SpO2  04/12/11 2127 169/69 mmHg 97.9 F (36.6 C) Oral 74  18  100 %  04/12/11 1930 - - - - - 98 %  04/12/11 1808 175/74 mmHg 97.7 F (36.5 C) Oral 89  18  98 %   General Appearance:    Alert, cooperative, no distress, appears stated age  Head:    Normocephalic, without obvious abnormality, atraumatic  Eyes:     PERRL, EOMI, anicteric sclerae  Lungs:     Clear to auscultation bilaterally,  respirations unlabored  Heart:    Regular rate and rhythm, S1 and S2 normal  Abdomen:     Soft, non-tender, normoactive bowel sounds  Extremities:   Extremities normal, atraumatic, no cyanosis or edema  Pulses:   2+ and symmetric all extremities  Skin:   no rashes   Neurologic:   No focal deficits. AAO x3   Results for orders placed during the hospital encounter of 04/12/11 (from the past 48 hour(s))  CBC     Status: Normal   Collection Time   04/12/11  6:41 PM      Component Value Range Comment   WBC 5.1  4.0 - 10.5 (K/uL)    RBC 4.55  4.22 - 5.81 (MIL/uL)    Hemoglobin 13.6  13.0 - 17.0 (g/dL)    HCT 40.9  81.1 - 91.4 (%)    MCV 87.5  78.0 - 100.0 (fL)    MCH 29.9  26.0 - 34.0 (pg)    MCHC 34.2  30.0 - 36.0 (g/dL)    RDW 78.2  95.6 - 21.3 (%)    Platelets 181  150 - 400 (K/uL)   POCT I-STAT, CHEM 8     Status: Normal   Collection Time   04/12/11  7:01 PM      Component Value Range Comment   Sodium 144  135 - 145 (mEq/L)    Potassium 4.0  3.5 - 5.1 (mEq/L)    Chloride 107  96 - 112 (mEq/L)    BUN 16  6 - 23 (mg/dL)    Creatinine, Ser 0.86  0.50 - 1.35 (mg/dL)    Glucose, Bld 93  70 - 99 (mg/dL)    Calcium, Ion 5.78  1.12 - 1.32 (mmol/L)    TCO2 26  0 - 100 (mmol/L)    Hemoglobin 14.6  13.0 - 17.0 (g/dL)    HCT 46.9  62.9 - 52.8 (%)   POCT I-STAT TROPONIN I     Status: Normal   Collection Time   04/12/11  7:01 PM      Component Value Range Comment   Troponin i, poc 0.00  0.00 - 0.08 (ng/mL)    Comment 3            BASIC METABOLIC PANEL     Status: Abnormal   Collection Time   04/12/11  8:22 PM      Component Value Range Comment   Sodium 143  135 - 145 (mEq/L)    Potassium 3.8  3.5 - 5.1 (mEq/L)  Chloride 102  96 - 112 (mEq/L)    CO2 25  19 - 32 (mEq/L)    Glucose, Bld 83  70 - 99 (mg/dL)    BUN 14  6 - 23 (mg/dL)    Creatinine, Ser 1.61  0.50 - 1.35 (mg/dL)    Calcium 9.7  8.4 - 10.5 (mg/dL)    GFR calc non Af Amer 72 (*) >90 (mL/min)    GFR calc Af Amer 83 (*)  >90 (mL/min)    Dg Chest 2 View  04/12/2011  *RADIOLOGY REPORT*  Clinical Data: Chest pain  CHEST - 2 VIEW  Comparison: 03/04/2011  Findings: The heart size appears within normal limits.  Prior median sternotomy and CABG procedure.  No pleural effusion or pulmonary edema.  No airspace consolidation is identified.  There is mild spondylosis identified within the thoracic spine.  IMPRESSION:  1.  No acute cardiopulmonary abnormalities.  Original Report Authenticated By: Rosealee Albee, M.D.    ECG:  Sinus rhythm HR 87 LBBB, no significant change from prior  Assessment: Chest pain in 69M with known hx of CAD s/p CABG, also chronic noncardiac chest pain, planned for surgical intervention next week of severe left ICA stenosis.  Plan:  1. Admit to Cardiology, Telemetry Unit 2. Repeat ekg on admit, prn chest pain or arrythmia 3. Trend cardiac biomarkers,  4. Medical management to include ASA,CCB, BB, ACEi, Statin, NTG prn 5. Keep NPO @ [facility] for further ischemic evaluation, was planned for nuclear stress as preoperative evaluation.

## 2011-04-12 NOTE — ED Notes (Signed)
Pt placed on cardiac monitor, bp cuff, and pulse ox. Pt sts "I feel like I have a hard time taking a deep breath." Pt also states he is not SOB. Attempted to put pt on O2 per protocol, pt refused, sts "I don't need that. I'm fine."

## 2011-04-12 NOTE — ED Notes (Signed)
Pt also reports having right side headache, intermittent, sharp pain x "couple days ago". Pt has hx of migraines, pt on blood thinners. Pt has blockage to artery on left side with surgery due on 04/19/2011. Pt is A/O x 3, resp e/u, reports having mild shortness of breath and pt placed on oxygen via nasal cannula.

## 2011-04-12 NOTE — Telephone Encounter (Signed)
Daughter called [facility] as pt. had been evaluated 3/19 for Carotid Disease.  States that pt. is "c/o chest pain, shaky, SOB, and is seeing floaters".  States doesn't know if pt. is just nervous about upcoming surgery. Pt scheduled for left CEA 04/19/11.  Per Dr. Candie Chroman office note of 3/19, pt. stated he has been having chest discomfort.  Questioned daughter how long the chest discomfort has been present today?  Stated "he said it started about 2 wks. ago, when he helped someone move".  Advised daughter since pt. is established w/ Dr. Jens Som, to call his office and report the chest pain and current symptoms.  Verb. understanding, and agreed.

## 2011-04-12 NOTE — ED Notes (Signed)
Admitting MD at bedside, pt awaiting inpt beds assignment.  

## 2011-04-12 NOTE — ED Provider Notes (Signed)
History     CSN: [id number]  Arrival date & time 04/12/11  1802   First MD Initiated Contact with Patient 04/12/11 2050      Chief Complaint  Patient presents with  . Chest Pain    (Consider location/radiation/quality/duration/timing/severity/associated sxs/prior treatment) HPI Comments: Patient is a 71 year old man who complains of chest pain. He had fair pain on and off for a week. He has a history of coronary artery disease, and has had coronary artery bypass grafting as well as cardiac stents. He is followed for his coronary disease with Dr. Jens Som. Recently, he has had difficulty with his vision, and had carotid Dopplers which showed 95% stenosis of the left carotid artery. He is scheduled for surgery on his carotid artery on March 27. He is supposed to have a cardiac Myoview done as a preop evaluation, scheduled for tomorrow. When he had worsening chest pain today, his daughter called [facility] cardiology, was advised to have him come to the hospital for evaluation.  Patient is a 71 y.o. male presenting with chest pain. The history is provided by the patient, medical records and a relative. No language interpreter was used.  Chest Pain The chest pain began 5 - 7 days ago. Episode Length: Intermittent episodes of chest pain, the longest of which lasted about an hour. Chest pain occurs intermittently. The chest pain is worsening. Associated with: No specific precipitating cause. The severity of the pain is moderate. The quality of the pain is described as pressure-like. The pain radiates to the left jaw and left neck. Exacerbated by: Nothing. Pertinent negatives for primary symptoms include no fatigue. He tried nitroglycerin for the symptoms. Risk factors: Known coronary artery disease, and known carotid vascular disease.  His past medical history is significant for CAD, MI and PVD. Procedure history comments: Carotid Dopplers..     Past Medical History  Diagnosis Date  . Hypertension     . Prostate cancer   . Pancreatitis Dec 2011    ERCP ok.  . Hiatal hernia   . Coronary artery disease     s/p CABG in 2001, s/p DES to left main in the past as well as stenting of the LCX and RCA. Negative stress test in December 2011 but continued to have pain. S/P repeat cath showing patent radial graft to the DX with total occlusion of the distal limb to the LCX and a patent LIMA to LAD. Stentsin the left main, LCX & RCA were patent but did have DES to the LCX for high grade bifurcational AV/OM   . Chronic chest pain   . Left bundle branch block   . Carotid disease, bilateral   . GERD (gastroesophageal reflux disease)   . Hyperlipidemia   . Inguinal hernia   . Statin intolerance   . Anxiety disorder   . Peripheral vascular disease 04/10/11    Past Surgical History  Procedure Date  . Heart stents   . Cholecystectomy   . Inguinal hernia repair     bilateral  . Coronary artery bypass graft 2001    Family History  Problem Relation Age of Onset  . Heart disease Mother     Heart Disease before age 43  . Hypertension Mother   . Cancer Father   . Hypertension Sister   . Heart disease Sister     Heart Disease before age 19  . Hypertension Brother   . Heart disease Brother     Heart Disease before age 11  . Heart attack  Brother     History  Substance Use Topics  . Smoking status: Never Smoker   . Smokeless tobacco: Current User    Types: Chew  . Alcohol Use: No      Review of Systems  Constitutional: Negative.  Negative for chills and fatigue.  HENT: Negative.   Eyes: Negative.   Respiratory: Negative.   Cardiovascular: Positive for chest pain.  Gastrointestinal: Negative.   Genitourinary: Negative.   Musculoskeletal: Negative.   Skin: Negative.   Neurological:       Recent history of amaurosis fugax  Psychiatric/Behavioral: Negative.     Allergies  Codeine; Hydrocodone; OVF:IEPPIRJJOAC+ZYSAYTKZS+WFUXNATFTD acid+aspartame; Morphine; Nitrofuran derivatives;  Other; Oxycodone hcl; Shellfish allergy; Tramadol; and Zolpidem tartrate  Home Medications   Current Outpatient Rx  Name Route Sig Dispense Refill  . ISOSORBIDE MONONITRATE ER 60 MG PO TB24 Oral Take 30 mg by mouth 2 (two) times daily.    Marland Kitchen AMLODIPINE BESYLATE 5 MG PO TABS Oral Take 2.5 mg by mouth 2 (two) times daily.    . ASPIRIN EC 81 MG PO TBEC Oral Take 81 mg by mouth daily.    Marland Kitchen CLOPIDOGREL BISULFATE 75 MG PO TABS Oral Take 1 tablet (75 mg total) by mouth daily. 30 tablet 11  . DIPHENHYDRAMINE HCL 25 MG PO TABS Oral Take 25 mg by mouth at bedtime. sleep    . ENALAPRIL MALEATE 20 MG PO TABS Oral Take 1 tablet (20 mg total) by mouth 2 (two) times daily. 60 tablet 1    Pt must have OV for additional refills  . METOCLOPRAMIDE HCL 10 MG PO TABS Oral Take 5 mg by mouth 4 (four) times daily as needed. Stomach pain    . METOPROLOL TARTRATE 50 MG PO TABS Oral Take 75 mg by mouth 2 (two) times daily.    Marland Kitchen NITROGLYCERIN 0.4 MG SL SUBL Sublingual Place 0.4 mg under the tongue every 5 (five) minutes as needed. Chest pain    . OMEGA-3-ACID ETHYL ESTERS 1 G PO CAPS Oral Take 2 g by mouth 2 (two) times daily.    Marland Kitchen PANTOPRAZOLE SODIUM 40 MG PO TBEC Oral Take 40 mg by mouth daily.      BP 175/74  Pulse 89  Temp(Src) 97.7 F (36.5 C) (Oral)  Resp 18  SpO2 98%  Physical Exam  Nursing note and vitals reviewed. Constitutional: He is oriented to person, place, and time.       Pleasant elderly man in no distress.  HENT:  Head: Normocephalic and atraumatic.  Right Ear: External ear normal.  Left Ear: External ear normal.  Mouth/Throat: Oropharynx is clear and moist.  Eyes: Conjunctivae and EOM are normal. Pupils are equal, round, and reactive to light.  Neck: Normal range of motion. Neck supple.       Left carotid bruit.  Cardiovascular: Normal rate, regular rhythm and normal heart sounds.   Pulmonary/Chest: Effort normal and breath sounds normal.  Abdominal: Soft.  Musculoskeletal: Normal  range of motion. He exhibits no edema and no tenderness.  Neurological: He is alert and oriented to person, place, and time.       No sensory or motor deficit.  Skin: Skin is warm and dry.  Psychiatric: He has a normal mood and affect. His behavior is normal.    ED Course  Procedures (including critical care time)  Labs Reviewed  BASIC METABOLIC PANEL - Abnormal; Notable for the following:    GFR calc non Af Amer 72 (*)  GFR calc Af Amer 83 (*)    All other components within normal limits  CBC  POCT I-STAT, CHEM 8  POCT I-STAT TROPONIN I   Dg Chest 2 View  04/12/2011  *RADIOLOGY REPORT*  Clinical Data: Chest pain  CHEST - 2 VIEW  Comparison: 03/04/2011  Findings: The heart size appears within normal limits.  Prior median sternotomy and CABG procedure.  No pleural effusion or pulmonary edema.  No airspace consolidation is identified.  There is mild spondylosis identified within the thoracic spine.  IMPRESSION:  1.  No acute cardiopulmonary abnormalities.  Original Report Authenticated By: Rosealee Albee, M.D.    Date: 04/12/2011  Rate:87  Rhythm: normal sinus rhythm  QRS Axis: normal  Intervals: normal  ST/T Wave abnormalities: ST depressions inferiorly and ST depressions laterally  Conduction Disutrbances:left bundle branch block  Narrative Interpretation: Abnormal EKG.  Old EKG Reviewed: changes noted--rate faster today, 87 vs 59.    9:37 PM Patient was seen and had physical examination. EKG was nonacute, showing a left bundle branch block which has been present in the past. Laboratory tests were ordered.  Results for orders placed during the hospital encounter of 04/12/11  CBC      Component Value Range   WBC 5.1  4.0 - 10.5 (K/uL)   RBC 4.55  4.22 - 5.81 (MIL/uL)   Hemoglobin 13.6  13.0 - 17.0 (g/dL)   HCT 16.1  09.6 - 04.5 (%)   MCV 87.5  78.0 - 100.0 (fL)   MCH 29.9  26.0 - 34.0 (pg)   MCHC 34.2  30.0 - 36.0 (g/dL)   RDW 40.9  81.1 - 91.4 (%)   Platelets 181   150 - 400 (K/uL)  POCT I-STAT, CHEM 8      Component Value Range   Sodium 144  135 - 145 (mEq/L)   Potassium 4.0  3.5 - 5.1 (mEq/L)   Chloride 107  96 - 112 (mEq/L)   BUN 16  6 - 23 (mg/dL)   Creatinine, Ser 7.82  0.50 - 1.35 (mg/dL)   Glucose, Bld 93  70 - 99 (mg/dL)   Calcium, Ion 9.56  1.12 - 1.32 (mmol/L)   TCO2 26  0 - 100 (mmol/L)   Hemoglobin 14.6  13.0 - 17.0 (g/dL)   HCT 21.3  08.6 - 57.8 (%)  POCT I-STAT TROPONIN I      Component Value Range   Troponin i, poc 0.00  0.00 - 0.08 (ng/mL)   Comment 3           BASIC METABOLIC PANEL      Component Value Range   Sodium 143  135 - 145 (mEq/L)   Potassium 3.8  3.5 - 5.1 (mEq/L)   Chloride 102  96 - 112 (mEq/L)   CO2 25  19 - 32 (mEq/L)   Glucose, Bld 83  70 - 99 (mg/dL)   BUN 14  6 - 23 (mg/dL)   Creatinine, Ser 4.69  0.50 - 1.35 (mg/dL)   Calcium 9.7  8.4 - 62.9 (mg/dL)   GFR calc non Af Amer 72 (*) >90 (mL/min)   GFR calc Af Amer 83 (*) >90 (mL/min)   Dg Chest 2 View  04/12/2011  *RADIOLOGY REPORT*  Clinical Data: Chest pain  CHEST - 2 VIEW  Comparison: 03/04/2011  Findings: The heart size appears within normal limits.  Prior median sternotomy and CABG procedure.  No pleural effusion or pulmonary edema.  No airspace consolidation is identified.  There  is mild spondylosis identified within the thoracic spine.  IMPRESSION:  1.  No acute cardiopulmonary abnormalities.  Original Report Authenticated By: Rosealee Albee, M.D.   Ct Head Wo Contrast  04/07/2011  *RADIOLOGY REPORT*  Clinical Data: Headache.  Memory loss.  History prostate cancer.  CT HEAD WITHOUT CONTRAST  Technique:  Contiguous axial images were obtained from the base of the skull through the vertex without contrast.  Comparison: 08/25/2008.  08/24/2008.  Findings: The brain shows mild age related atrophy.  No evidence of focal old or acute small or large vessel infarction.  No mass lesion, hemorrhage, hydrocephalus or extra-axial collection.  There is atherosclerotic  calcification of the major vessels at the base the brain.  The calvarium is unremarkable.  Sinuses, middle ears and mastoids are clear.  IMPRESSION: No acute or focal finding.  Age related atrophy.  Atherosclerosis.  Original Report Authenticated By: Thomasenia Sales, M.D.   10:10 PM Lab tests reviewed with Dr. Terressa Koyanagi, covering for [facility].  Admit to a telemetry bed to Dr. Jens Som.  1. Chest pain   2. Coronary artery disease   3. Carotid artery stenosis        Carleene Cooper III, MD 04/12/11 2212

## 2011-04-13 ENCOUNTER — Other Ambulatory Visit: Payer: Self-pay

## 2011-04-13 ENCOUNTER — Ambulatory Visit (HOSPITAL_COMMUNITY): Payer: Medicare Other

## 2011-04-13 ENCOUNTER — Inpatient Hospital Stay (HOSPITAL_COMMUNITY): Payer: Medicare Other

## 2011-04-13 ENCOUNTER — Encounter (HOSPITAL_COMMUNITY): Payer: Self-pay

## 2011-04-13 DIAGNOSIS — Z79899 Other long term (current) drug therapy: Secondary | ICD-10-CM | POA: Diagnosis not present

## 2011-04-13 DIAGNOSIS — Z9861 Coronary angioplasty status: Secondary | ICD-10-CM | POA: Diagnosis not present

## 2011-04-13 DIAGNOSIS — Z951 Presence of aortocoronary bypass graft: Secondary | ICD-10-CM | POA: Diagnosis not present

## 2011-04-13 DIAGNOSIS — I10 Essential (primary) hypertension: Secondary | ICD-10-CM | POA: Diagnosis not present

## 2011-04-13 DIAGNOSIS — I2510 Atherosclerotic heart disease of native coronary artery without angina pectoris: Secondary | ICD-10-CM | POA: Diagnosis not present

## 2011-04-13 DIAGNOSIS — F411 Generalized anxiety disorder: Secondary | ICD-10-CM | POA: Diagnosis present

## 2011-04-13 DIAGNOSIS — R0602 Shortness of breath: Secondary | ICD-10-CM | POA: Diagnosis not present

## 2011-04-13 DIAGNOSIS — Z7902 Long term (current) use of antithrombotics/antiplatelets: Secondary | ICD-10-CM | POA: Diagnosis not present

## 2011-04-13 DIAGNOSIS — F17200 Nicotine dependence, unspecified, uncomplicated: Secondary | ICD-10-CM | POA: Diagnosis present

## 2011-04-13 DIAGNOSIS — Z8546 Personal history of malignant neoplasm of prostate: Secondary | ICD-10-CM | POA: Diagnosis not present

## 2011-04-13 DIAGNOSIS — R079 Chest pain, unspecified: Secondary | ICD-10-CM | POA: Diagnosis not present

## 2011-04-13 DIAGNOSIS — K219 Gastro-esophageal reflux disease without esophagitis: Secondary | ICD-10-CM | POA: Diagnosis not present

## 2011-04-13 DIAGNOSIS — Z8249 Family history of ischemic heart disease and other diseases of the circulatory system: Secondary | ICD-10-CM | POA: Diagnosis not present

## 2011-04-13 DIAGNOSIS — I6529 Occlusion and stenosis of unspecified carotid artery: Secondary | ICD-10-CM | POA: Diagnosis present

## 2011-04-13 DIAGNOSIS — I252 Old myocardial infarction: Secondary | ICD-10-CM | POA: Diagnosis not present

## 2011-04-13 DIAGNOSIS — I739 Peripheral vascular disease, unspecified: Secondary | ICD-10-CM | POA: Diagnosis present

## 2011-04-13 DIAGNOSIS — R0789 Other chest pain: Secondary | ICD-10-CM | POA: Diagnosis not present

## 2011-04-13 DIAGNOSIS — Z7982 Long term (current) use of aspirin: Secondary | ICD-10-CM | POA: Diagnosis not present

## 2011-04-13 DIAGNOSIS — E7800 Pure hypercholesterolemia, unspecified: Secondary | ICD-10-CM | POA: Diagnosis present

## 2011-04-13 DIAGNOSIS — Z888 Allergy status to other drugs, medicaments and biological substances status: Secondary | ICD-10-CM | POA: Diagnosis not present

## 2011-04-13 DIAGNOSIS — E785 Hyperlipidemia, unspecified: Secondary | ICD-10-CM | POA: Diagnosis present

## 2011-04-13 LAB — DIFFERENTIAL
Basophils Absolute: 0 10*3/uL (ref 0.0–0.1)
Eosinophils Relative: 1 % (ref 0–5)
Lymphocytes Relative: 29 % (ref 12–46)
Lymphs Abs: 1.5 10*3/uL (ref 0.7–4.0)
Neutro Abs: 3 10*3/uL (ref 1.7–7.7)
Neutrophils Relative %: 60 % (ref 43–77)

## 2011-04-13 LAB — CBC
HCT: 36.7 % — ABNORMAL LOW (ref 39.0–52.0)
Hemoglobin: 13 g/dL (ref 13.0–17.0)
Hemoglobin: 13.1 g/dL (ref 13.0–17.0)
MCH: 30.5 pg (ref 26.0–34.0)
MCHC: 35.4 g/dL (ref 30.0–36.0)
MCHC: 35.2 g/dL (ref 30.0–36.0)
MCV: 86.5 fL (ref 78.0–100.0)
RBC: 4.26 MIL/uL (ref 4.22–5.81)
RBC: 4.3 MIL/uL (ref 4.22–5.81)
WBC: 4.6 10*3/uL (ref 4.0–10.5)

## 2011-04-13 LAB — BASIC METABOLIC PANEL
BUN: 14 mg/dL (ref 6–23)
Chloride: 106 mEq/L (ref 96–112)
GFR calc Af Amer: >90 mL/min (ref >90)
GFR calc non Af Amer: 82 mL/min — ABNORMAL LOW (ref >90)
Potassium: 3.7 mEq/L (ref 3.5–5.1)
Sodium: 142 mEq/L (ref 135–145)

## 2011-04-13 LAB — CARDIAC PANEL(CRET KIN+CKTOT+MB+TROPI)
CK, MB: 2.7 ng/mL (ref 0.3–4.0)
CK, MB: 3.1 ng/mL (ref 0.3–4.0)
CK, MB: 3.3 ng/mL (ref 0.3–4.0)
Relative Index: INVALID (ref 0.0–2.5)
Relative Index: INVALID (ref 0.0–2.5)
Total CK: 53 U/L (ref 7–232)
Total CK: 79 U/L (ref 7–232)
Troponin I: <0.3 ng/mL (ref <0.30)
Troponin I: <0.3 ng/mL (ref <0.30)
Troponin I: <0.3 ng/mL (ref <0.30)

## 2011-04-13 LAB — COMPREHENSIVE METABOLIC PANEL
CO2: 27 mEq/L (ref 19–32)
Calcium: 9.2 mg/dL (ref 8.4–10.5)
Creatinine, Ser: 1.03 mg/dL (ref 0.50–1.35)
GFR calc Af Amer: 83 mL/min — ABNORMAL LOW (ref >90)
GFR calc non Af Amer: 72 mL/min — ABNORMAL LOW (ref >90)
Glucose, Bld: 99 mg/dL (ref 70–99)
Total Protein: 6.2 g/dL (ref 6.0–8.3)

## 2011-04-13 LAB — PROTIME-INR
INR: 1.01 (ref 0.00–1.49)
INR: 1.03 (ref 0.00–1.49)
Prothrombin Time: 13.5 seconds (ref 11.6–15.2)
Prothrombin Time: 13.7 seconds (ref 11.6–15.2)

## 2011-04-13 LAB — URINALYSIS, ROUTINE W REFLEX MICROSCOPIC
Hgb urine dipstick: NEGATIVE
Protein, ur: NEGATIVE mg/dL
Urobilinogen, UA: 1 mg/dL (ref 0.0–1.0)

## 2011-04-13 MED ORDER — ONDANSETRON HCL 4 MG/2ML IJ SOLN
4.0000 mg | Freq: Three times a day (TID) | INTRAMUSCULAR | Status: DC | PRN
  Administered 2011-04-13 – 2011-04-14 (×3): 4 mg via INTRAVENOUS
  Filled 2011-04-13: qty 2
  Filled 2011-04-13: qty 4

## 2011-04-13 MED ORDER — HEPARIN SODIUM (PORCINE) 5000 UNIT/ML IJ SOLN
5000.0000 [IU] | Freq: Three times a day (TID) | INTRAMUSCULAR | Status: DC
  Administered 2011-04-13 – 2011-04-15 (×6): 5000 [IU] via SUBCUTANEOUS
  Filled 2011-04-13 (×10): qty 1

## 2011-04-13 MED ORDER — ISOSORBIDE MONONITRATE ER 30 MG PO TB24
30.0000 mg | ORAL_TABLET | Freq: Two times a day (BID) | ORAL | Status: DC
  Administered 2011-04-13 – 2011-04-15 (×4): 30 mg via ORAL
  Filled 2011-04-13 (×6): qty 1

## 2011-04-13 MED ORDER — REGADENOSON 0.4 MG/5ML IV SOLN
0.4000 mg | Freq: Once | INTRAVENOUS | Status: AC
  Administered 2011-04-13: 0.4 mg via INTRAVENOUS
  Filled 2011-04-13: qty 5

## 2011-04-13 MED ORDER — METOCLOPRAMIDE HCL 5 MG PO TABS
5.0000 mg | ORAL_TABLET | Freq: Four times a day (QID) | ORAL | Status: DC | PRN
  Administered 2011-04-13 – 2011-04-14 (×2): 5 mg via ORAL
  Filled 2011-04-13 (×3): qty 1

## 2011-04-13 MED ORDER — SODIUM CHLORIDE 0.9 % IJ SOLN
3.0000 mL | Freq: Two times a day (BID) | INTRAMUSCULAR | Status: DC
  Administered 2011-04-13 – 2011-04-15 (×2): 3 mL via INTRAVENOUS

## 2011-04-13 MED ORDER — SENNOSIDES-DOCUSATE SODIUM 8.6-50 MG PO TABS
1.0000 | ORAL_TABLET | Freq: Every evening | ORAL | Status: DC | PRN
  Filled 2011-04-13: qty 1

## 2011-04-13 MED ORDER — DIPHENHYDRAMINE HCL 25 MG PO TABS
25.0000 mg | ORAL_TABLET | Freq: Every day | ORAL | Status: DC
  Administered 2011-04-13 – 2011-04-14 (×2): 25 mg via ORAL
  Filled 2011-04-13 (×3): qty 1

## 2011-04-13 MED ORDER — VANCOMYCIN HCL IN DEXTROSE 1-5 GM/200ML-% IV SOLN
1000.0000 mg | INTRAVENOUS | Status: AC
  Administered 2011-04-14: 1000 mg via INTRAVENOUS
  Filled 2011-04-13: qty 200

## 2011-04-13 MED ORDER — REGADENOSON 0.4 MG/5ML IV SOLN
INTRAVENOUS | Status: AC
  Filled 2011-04-13: qty 5

## 2011-04-13 MED ORDER — TECHNETIUM TC 99M TETROFOSMIN IV KIT
10.0000 | PACK | Freq: Once | INTRAVENOUS | Status: AC | PRN
  Administered 2011-04-13: 10 via INTRAVENOUS

## 2011-04-13 MED ORDER — SODIUM CHLORIDE 0.9 % IV SOLN: INTRAVENOUS | Status: DC

## 2011-04-13 MED ORDER — ACETAMINOPHEN 325 MG PO TABS
650.0000 mg | ORAL_TABLET | Freq: Four times a day (QID) | ORAL | Status: DC | PRN
  Administered 2011-04-14: 650 mg via ORAL
  Filled 2011-04-13: qty 2

## 2011-04-13 MED ORDER — OMEGA-3-ACID ETHYL ESTERS 1 G PO CAPS
1.0000 g | ORAL_CAPSULE | Freq: Two times a day (BID) | ORAL | Status: DC
  Administered 2011-04-13 – 2011-04-15 (×4): 1 g via ORAL
  Filled 2011-04-13 (×6): qty 1

## 2011-04-13 MED ORDER — PANTOPRAZOLE SODIUM 40 MG PO TBEC
40.0000 mg | DELAYED_RELEASE_TABLET | Freq: Two times a day (BID) | ORAL | Status: DC
  Administered 2011-04-13 – 2011-04-15 (×4): 40 mg via ORAL
  Filled 2011-04-13 (×4): qty 1

## 2011-04-13 MED ORDER — CLOPIDOGREL BISULFATE 75 MG PO TABS
75.0000 mg | ORAL_TABLET | Freq: Every day | ORAL | Status: DC
  Administered 2011-04-13: 75 mg via ORAL
  Filled 2011-04-13 (×2): qty 1

## 2011-04-13 MED ORDER — NITROGLYCERIN 0.4 MG SL SUBL: 0.4000 mg | SUBLINGUAL_TABLET | SUBLINGUAL | Status: DC | PRN

## 2011-04-13 MED ORDER — AMLODIPINE BESYLATE 2.5 MG PO TABS
2.5000 mg | ORAL_TABLET | Freq: Two times a day (BID) | ORAL | Status: DC
  Administered 2011-04-13 – 2011-04-15 (×4): 2.5 mg via ORAL
  Filled 2011-04-13 (×6): qty 1

## 2011-04-13 MED ORDER — SODIUM CHLORIDE 0.9 % IV SOLN: INTRAVENOUS | Status: AC

## 2011-04-13 MED ORDER — ENALAPRIL MALEATE 20 MG PO TABS
20.0000 mg | ORAL_TABLET | Freq: Two times a day (BID) | ORAL | Status: DC
  Administered 2011-04-13 – 2011-04-15 (×4): 20 mg via ORAL
  Filled 2011-04-13 (×6): qty 1

## 2011-04-13 MED ORDER — ACETAMINOPHEN 650 MG RE SUPP: 650.0000 mg | Freq: Four times a day (QID) | RECTAL | Status: DC | PRN

## 2011-04-13 MED ORDER — TECHNETIUM TC 99M TETROFOSMIN IV KIT
30.0000 | PACK | Freq: Once | INTRAVENOUS | Status: AC | PRN
  Administered 2011-04-13: 30 via INTRAVENOUS

## 2011-04-13 MED ORDER — METOPROLOL TARTRATE 50 MG PO TABS
75.0000 mg | ORAL_TABLET | Freq: Two times a day (BID) | ORAL | Status: DC
  Administered 2011-04-13 – 2011-04-15 (×4): 75 mg via ORAL
  Filled 2011-04-13 (×6): qty 1

## 2011-04-13 MED ORDER — ASPIRIN EC 81 MG PO TBEC
81.0000 mg | DELAYED_RELEASE_TABLET | Freq: Every day | ORAL | Status: DC
  Administered 2011-04-13 – 2011-04-15 (×2): 81 mg via ORAL
  Filled 2011-04-13 (×3): qty 1

## 2011-04-13 NOTE — Progress Notes (Signed)
Patient seen briefly in nuclear stress room.   Pre Test: VSS. EKG sinus rhythm LBBB. Pt w/ c/o nausea (received zofran), left neck/jaw pain, and mild chest discomfort.  During Test: VSS. EKG sinus rhythm LBBB. Pt w/ c/o nausea, abd pain, and mild chest discomfort.  Post Test: VSS. EKG sinus rhythm LBBB. Pt w/ mild chest discomfort, nausea beginning to resolve.  Await interpretation of images.  [name] [name] 04/13/2011 10:26 AM

## 2011-04-13 NOTE — Consult Note (Signed)
Pt chews 1 pack of tobacco per day and has cut down from 3 ppd to 1ppd. Encouraged pt to continue cutting. He doesn't want to use any med aids to help him with quitting. Referred to 1-800 quit now for f/u and support. Discussed oral fixation substitutes, second hand smoke and in home smoking policy. Reviewed and gave pt Written education/contact information.

## 2011-04-13 NOTE — Progress Notes (Signed)
@   Subjective:  Denies CP or dyspnea   Objective:  Filed Vitals:   04/12/11 2351 04/13/11 0121 04/13/11 0500 04/13/11 0644  BP:  143/82  140/92  Pulse:  70  72  Temp: 97.6 F (36.4 C) 97.3 F (36.3 C)  97.4 F (36.3 C)  TempSrc: Oral Oral  Oral  Resp:  18  18  Height:   5\' 4"  (1.626 m)   Weight:   139 lb 5.3 oz (63.2 kg)   SpO2:  98%  97%    Intake/Output from previous day:  Intake/Output Summary (Last 24 hours) at 04/13/11 0710 Last data filed at 04/13/11 0600  Gross per 24 hour  Intake      0 ml  Output    200 ml  Net   -200 ml    Physical Exam: Physical exam: Well-developed well-nourished in no acute distress.  Skin is warm and dry.  HEENT is normal.  Neck is supple. No thyromegaly.  Chest is clear to auscultation with normal expansion.  Cardiovascular exam is regular rate and rhythm.  Abdominal exam nontender or distended. No masses palpated. Extremities show no edema. neuro grossly intact    Lab Results: Basic Metabolic Panel:  Basename 04/12/11 2302 04/12/11 2022  NA 141 143  K 3.6 3.8  CL 103 102  CO2 26 25  GLUCOSE 116* 83  BUN 13 14  CREATININE 1.01 1.03  CALCIUM 9.0 9.7  MG -- --  PHOS -- --   CBC:  Basename 04/12/11 2302 04/12/11 1901 04/12/11 1841  WBC 6.1 -- 5.1  NEUTROABS 3.9 -- --  HGB 12.9* 14.6 --  HCT 35.8* 43.0 --  MCV 85.4 -- 87.5  PLT 162 -- 181   Cardiac Enzymes:  Basename 04/13/11 0110  CKTOTAL 53  CKMB 2.7  CKMBINDEX --  TROPONINI <0.30     Assessment/Plan:  #1 chest pain-the patient's symptoms are extremely difficult to evaluate. He does have coronary disease status post coronary artery bypass and graft. However he also has chronic chest pain. He tells me that his chest pain occurs after eating. His electrocardiogram shows a left bundle branch block. His cardiac markers are negative. Plan to proceed with Myoview today for risk stratification. #2-cerebrovascular disease-the patient states he he had transient  visual disturbance which prompted his ER visit last evening. These symptoms are similar to those previous felt to be a TIA. I will notify Dr. Hart Rochester that patient is here. If his Myoview is negative he may need surgical intervention sooner rather than later. Discontinue Plavix #3-hypertension-continue present medications and follow. #4-hyperlipidemia-statin intolerant. Olga Millers 04/13/2011, 7:10 AM

## 2011-04-14 ENCOUNTER — Encounter (HOSPITAL_COMMUNITY): Payer: Self-pay | Admitting: Certified Registered"

## 2011-04-14 ENCOUNTER — Encounter (HOSPITAL_COMMUNITY)
Admission: EM | Disposition: A | Discharge status: Home / Self Care | Payer: Self-pay | Source: Home / Self Care | Attending: Cardiology

## 2011-04-14 ENCOUNTER — Inpatient Hospital Stay (HOSPITAL_COMMUNITY): Payer: Medicare Other | Admitting: Certified Registered"

## 2011-04-14 ENCOUNTER — Ambulatory Visit (HOSPITAL_COMMUNITY): Admission: RE | Admit: 2011-04-14 | Payer: Medicare Other | Source: Ambulatory Visit | Admitting: Vascular Surgery

## 2011-04-14 DIAGNOSIS — R079 Chest pain, unspecified: Secondary | ICD-10-CM | POA: Diagnosis not present

## 2011-04-14 DIAGNOSIS — I6529 Occlusion and stenosis of unspecified carotid artery: Secondary | ICD-10-CM

## 2011-04-14 DIAGNOSIS — I2510 Atherosclerotic heart disease of native coronary artery without angina pectoris: Secondary | ICD-10-CM | POA: Diagnosis not present

## 2011-04-14 DIAGNOSIS — R0789 Other chest pain: Secondary | ICD-10-CM | POA: Diagnosis not present

## 2011-04-14 DIAGNOSIS — K219 Gastro-esophageal reflux disease without esophagitis: Secondary | ICD-10-CM | POA: Diagnosis not present

## 2011-04-14 DIAGNOSIS — I10 Essential (primary) hypertension: Secondary | ICD-10-CM | POA: Diagnosis not present

## 2011-04-14 HISTORY — PX: ENDARTERECTOMY: SHX5162

## 2011-04-14 LAB — MRSA PCR SCREENING: MRSA by PCR: NEGATIVE

## 2011-04-14 LAB — CARDIAC PANEL(CRET KIN+CKTOT+MB+TROPI)
CK, MB: 2.9 ng/mL (ref 0.3–4.0)
CK, MB: 2 ng/mL (ref 0.3–4.0)
Relative Index: INVALID (ref 0.0–2.5)
Total CK: 82 U/L (ref 7–232)
Total CK: 50 U/L (ref 7–232)
Troponin I: <0.3 ng/mL (ref <0.30)
Troponin I: <0.3 ng/mL (ref <0.30)

## 2011-04-14 SURGERY — ENDARTERECTOMY, CAROTID
Anesthesia: General | Site: Neck | Laterality: Left

## 2011-04-14 MED ORDER — DOCUSATE SODIUM 100 MG PO CAPS
100.0000 mg | ORAL_CAPSULE | Freq: Every day | ORAL | Status: DC
  Administered 2011-04-15: 100 mg via ORAL
  Filled 2011-04-14: qty 1

## 2011-04-14 MED ORDER — ROCURONIUM BROMIDE 100 MG/10ML IV SOLN
INTRAVENOUS | Status: DC | PRN
  Administered 2011-04-14: 50 mg via INTRAVENOUS

## 2011-04-14 MED ORDER — PHENOL 1.4 % MT LIQD: 1.0000 | OROMUCOSAL | Status: DC | PRN

## 2011-04-14 MED ORDER — MAGNESIUM SULFATE 40 MG/ML IJ SOLN
2.0000 g | Freq: Once | INTRAMUSCULAR | Status: AC | PRN
  Filled 2011-04-14: qty 50

## 2011-04-14 MED ORDER — EPHEDRINE SULFATE 50 MG/ML IJ SOLN
INTRAMUSCULAR | Status: DC | PRN
  Administered 2011-04-14 (×6): 5 mg via INTRAVENOUS

## 2011-04-14 MED ORDER — KCL IN DEXTROSE-NACL 20-5-0.45 MEQ/L-%-% IV SOLN
INTRAVENOUS | Status: DC
  Administered 2011-04-14: 100 mL/h via INTRAVENOUS
  Filled 2011-04-14 (×5): qty 1000

## 2011-04-14 MED ORDER — ONDANSETRON HCL 4 MG/2ML IJ SOLN
4.0000 mg | Freq: Four times a day (QID) | INTRAMUSCULAR | Status: DC | PRN
  Filled 2011-04-14: qty 2

## 2011-04-14 MED ORDER — NEOSTIGMINE METHYLSULFATE 1 MG/ML IJ SOLN
INTRAMUSCULAR | Status: DC | PRN
  Administered 2011-04-14: 4 mg via INTRAVENOUS

## 2011-04-14 MED ORDER — MORPHINE SULFATE 10 MG/ML IJ SOLN: 0.0500 mg/kg | INTRAMUSCULAR | Status: DC | PRN

## 2011-04-14 MED ORDER — PHENYLEPHRINE HCL 10 MG/ML IJ SOLN
INTRAMUSCULAR | Status: DC | PRN
  Administered 2011-04-14: 40 ug via INTRAVENOUS

## 2011-04-14 MED ORDER — HYDRALAZINE HCL 20 MG/ML IJ SOLN: 10.0000 mg | INTRAMUSCULAR | Status: DC | PRN

## 2011-04-14 MED ORDER — FENTANYL CITRATE 0.05 MG/ML IJ SOLN
INTRAMUSCULAR | Status: DC | PRN
  Administered 2011-04-14: 50 ug via INTRAVENOUS
  Administered 2011-04-14: 100 ug via INTRAVENOUS
  Administered 2011-04-14: 50 ug via INTRAVENOUS
  Administered 2011-04-14: 25 ug via INTRAVENOUS

## 2011-04-14 MED ORDER — METOPROLOL TARTRATE 1 MG/ML IV SOLN: 2.0000 mg | INTRAVENOUS | Status: DC | PRN

## 2011-04-14 MED ORDER — POTASSIUM CHLORIDE CRYS ER 20 MEQ PO TBCR: 20.0000 meq | EXTENDED_RELEASE_TABLET | Freq: Once | ORAL | Status: AC | PRN

## 2011-04-14 MED ORDER — ESMOLOL HCL 10 MG/ML IV SOLN
INTRAVENOUS | Status: DC | PRN
  Administered 2011-04-14 (×2): 10 mg via INTRAVENOUS

## 2011-04-14 MED ORDER — GUAIFENESIN-DM 100-10 MG/5ML PO SYRP: 15.0000 mL | ORAL_SOLUTION | ORAL | Status: DC | PRN

## 2011-04-14 MED ORDER — HYDROMORPHONE HCL PF 1 MG/ML IJ SOLN
0.2500 mg | INTRAMUSCULAR | Status: DC | PRN
  Administered 2011-04-14 (×2): 0.5 mg via INTRAVENOUS

## 2011-04-14 MED ORDER — SODIUM CHLORIDE 0.9 % IR SOLN
Status: DC | PRN
  Administered 2011-04-14: 300 mL

## 2011-04-14 MED ORDER — SODIUM CHLORIDE 0.9 % IR SOLN
Status: DC | PRN
  Administered 2011-04-14: 08:00:00

## 2011-04-14 MED ORDER — DROPERIDOL 2.5 MG/ML IJ SOLN
0.6250 mg | Freq: Once | INTRAMUSCULAR | Status: AC
  Administered 2011-04-14: 0.625 mg via INTRAVENOUS

## 2011-04-14 MED ORDER — DEXAMETHASONE SODIUM PHOSPHATE 4 MG/ML IJ SOLN
INTRAMUSCULAR | Status: DC | PRN
  Administered 2011-04-14: 8 mg via INTRAVENOUS

## 2011-04-14 MED ORDER — DROPERIDOL 2.5 MG/ML IJ SOLN
INTRAMUSCULAR | Status: AC
  Filled 2011-04-14: qty 2

## 2011-04-14 MED ORDER — LABETALOL HCL 5 MG/ML IV SOLN: 10.0000 mg | INTRAVENOUS | Status: DC | PRN

## 2011-04-14 MED ORDER — DEXTROSE 5 % IV SOLN
1.5000 g | Freq: Two times a day (BID) | INTRAVENOUS | Status: AC
  Administered 2011-04-14 – 2011-04-15 (×2): 1.5 g via INTRAVENOUS
  Filled 2011-04-14 (×2): qty 1.5

## 2011-04-14 MED ORDER — TAMSULOSIN HCL 0.4 MG PO CAPS
0.4000 mg | ORAL_CAPSULE | Freq: Every day | ORAL | Status: DC
  Administered 2011-04-14: 0.4 mg via ORAL
  Filled 2011-04-14 (×2): qty 1

## 2011-04-14 MED ORDER — ONDANSETRON HCL 4 MG/2ML IJ SOLN
INTRAMUSCULAR | Status: DC | PRN
  Administered 2011-04-14: 4 mg via INTRAVENOUS

## 2011-04-14 MED ORDER — LIDOCAINE HCL 4 % MT SOLN
OROMUCOSAL | Status: DC | PRN
  Administered 2011-04-14: 4 mL via TOPICAL

## 2011-04-14 MED ORDER — ACETAMINOPHEN 325 MG PO TABS: 325.0000 mg | ORAL_TABLET | ORAL | Status: DC | PRN

## 2011-04-14 MED ORDER — FAMOTIDINE IN NACL 20-0.9 MG/50ML-% IV SOLN
20.0000 mg | Freq: Two times a day (BID) | INTRAVENOUS | Status: DC
  Administered 2011-04-14 – 2011-04-15 (×2): 20 mg via INTRAVENOUS
  Filled 2011-04-14 (×3): qty 50

## 2011-04-14 MED ORDER — ACETAMINOPHEN 650 MG RE SUPP: 325.0000 mg | RECTAL | Status: DC | PRN

## 2011-04-14 MED ORDER — PROPOFOL 10 MG/ML IV EMUL
INTRAVENOUS | Status: DC | PRN
  Administered 2011-04-14: 120 mg via INTRAVENOUS

## 2011-04-14 MED ORDER — ONDANSETRON HCL 4 MG/2ML IJ SOLN
4.0000 mg | Freq: Once | INTRAMUSCULAR | Status: AC | PRN
  Administered 2011-04-14: 4 mg via INTRAVENOUS

## 2011-04-14 MED ORDER — MIDAZOLAM HCL 5 MG/5ML IJ SOLN
INTRAMUSCULAR | Status: DC | PRN
  Administered 2011-04-14: 1 mg via INTRAVENOUS

## 2011-04-14 MED ORDER — PROTAMINE SULFATE 10 MG/ML IV SOLN
INTRAVENOUS | Status: DC | PRN
  Administered 2011-04-14: 50 mg via INTRAVENOUS

## 2011-04-14 MED ORDER — GLYCOPYRROLATE 0.2 MG/ML IJ SOLN
INTRAMUSCULAR | Status: DC | PRN
  Administered 2011-04-14: .6 mg via INTRAVENOUS

## 2011-04-14 MED ORDER — SODIUM CHLORIDE 0.9 % IV SOLN
INTRAVENOUS | Status: DC | PRN
  Administered 2011-04-14: 07:00:00 via INTRAVENOUS

## 2011-04-14 MED ORDER — HEPARIN SODIUM (PORCINE) 1000 UNIT/ML IJ SOLN
INTRAMUSCULAR | Status: DC | PRN
  Administered 2011-04-14: 6000 [IU] via INTRAVENOUS

## 2011-04-14 SURGICAL SUPPLY — 43 items
CANISTER SUCTION 2500CC (MISCELLANEOUS) ×2 IMPLANT
CATH ROBINSON RED A/P 18FR (CATHETERS) ×2 IMPLANT
CATH SUCT 10FR WHISTLE TIP (CATHETERS) ×2 IMPLANT
CLIP TI MEDIUM 24 (CLIP) ×2 IMPLANT
CLIP TI WIDE RED SMALL 24 (CLIP) ×2 IMPLANT
CLOTH BEACON ORANGE TIMEOUT ST (SAFETY) ×2 IMPLANT
COVER SURGICAL LIGHT HANDLE (MISCELLANEOUS) ×4 IMPLANT
CRADLE DONUT ADULT HEAD (MISCELLANEOUS) ×2 IMPLANT
DECANTER SPIKE VIAL GLASS SM (MISCELLANEOUS) IMPLANT
DRAIN HEMOVAC 1/8 X 5 (WOUND CARE) IMPLANT
DRAPE WARM FLUID 44X44 (DRAPE) ×2 IMPLANT
DRSG COVADERM 4X6 (GAUZE/BANDAGES/DRESSINGS) ×1 IMPLANT
ELECT REM PT RETURN 9FT ADLT (ELECTROSURGICAL) ×2
ELECTRODE REM PT RTRN 9FT ADLT (ELECTROSURGICAL) ×1 IMPLANT
EVACUATOR SILICONE 100CC (DRAIN) IMPLANT
GLOVE BIOGEL PI IND STRL 6.5 (GLOVE) IMPLANT
GLOVE BIOGEL PI INDICATOR 6.5 (GLOVE) ×3
GLOVE ECLIPSE 6.5 STRL STRAW (GLOVE) ×1 IMPLANT
GLOVE SS BIOGEL STRL SZ 6.5 (GLOVE) IMPLANT
GLOVE SS BIOGEL STRL SZ 7 (GLOVE) ×1 IMPLANT
GLOVE SUPERSENSE BIOGEL SZ 6.5 (GLOVE) ×1
GLOVE SUPERSENSE BIOGEL SZ 7 (GLOVE) ×1
GOWN STRL NON-REIN LRG LVL3 (GOWN DISPOSABLE) ×4 IMPLANT
INSERT FOGARTY SM (MISCELLANEOUS) ×2 IMPLANT
KIT BASIN OR (CUSTOM PROCEDURE TRAY) ×2 IMPLANT
KIT ROOM TURNOVER OR (KITS) ×2 IMPLANT
NEEDLE 22X1 1/2 (OR ONLY) (NEEDLE) IMPLANT
NS IRRIG 1000ML POUR BTL (IV SOLUTION) ×4 IMPLANT
PACK CAROTID (CUSTOM PROCEDURE TRAY) ×2 IMPLANT
PAD ARMBOARD 7.5X6 YLW CONV (MISCELLANEOUS) ×4 IMPLANT
PATCH HEMASHIELD 8X75 (Vascular Products) ×2 IMPLANT
SHUNT CAROTID BYPASS 12FRX15.5 (VASCULAR PRODUCTS) IMPLANT
SPECIMEN JAR SMALL (MISCELLANEOUS) ×2 IMPLANT
SUT PROLENE 6 0 C 1 24 (SUTURE) ×1 IMPLANT
SUT PROLENE 6 0 CC (SUTURE) ×2 IMPLANT
SUT SILK 2 0 FS (SUTURE) ×2 IMPLANT
SUT VIC AB 2-0 CT1 27 (SUTURE) ×2
SUT VIC AB 2-0 CT1 TAPERPNT 27 (SUTURE) ×1 IMPLANT
SUT VIC AB 3-0 X1 27 (SUTURE) ×2 IMPLANT
SYR CONTROL 10ML LL (SYRINGE) IMPLANT
TOWEL OR 17X24 6PK STRL BLUE (TOWEL DISPOSABLE) ×2 IMPLANT
TOWEL OR 17X26 10 PK STRL BLUE (TOWEL DISPOSABLE) ×2 IMPLANT
WATER STERILE IRR 1000ML POUR (IV SOLUTION) ×2 IMPLANT

## 2011-04-14 NOTE — Progress Notes (Signed)
Physical medicine rehabilitation consult requested. Patient status post carotid surgery this a.m.Marland Kitchen Patient remains lethargic. Physical therapy occupational therapy evaluations are pending. Will followup with appropriate rehabilitation consult Monday a.m.

## 2011-04-14 NOTE — Anesthesia Postprocedure Evaluation (Signed)
Anesthesia Post Note  Patient: [name] [name]  Procedure(s) Performed: Procedure(s) (LRB): ENDARTERECTOMY CAROTID (Left)  Anesthesia type: general  Patient location: PACU  Post pain: Pain level controlled  Post assessment: Patient's Cardiovascular Status Stable  Last Vitals:  Filed Vitals:   04/14/11 1015  BP:   Pulse:   Temp: 36.4 C  Resp:     Post vital signs: Reviewed and stable  Level of consciousness: sedated  Complications: No apparent anesthesia complications

## 2011-04-14 NOTE — Anesthesia Preprocedure Evaluation (Addendum)
Anesthesia Evaluation  Patient identified by MRN, date of birth, ID band Patient awake    Reviewed: Allergy & Precautions, H&P , NPO status , Patient's Chart, lab work & pertinent test results  History of Anesthesia Complications (+) PONV  Airway Mallampati: I TM Distance: >3 FB Neck ROM: Full    Dental  (+) Edentulous Upper, Edentulous Lower and Dental Advisory Given   Pulmonary          Cardiovascular hypertension, Pt. on medications + CAD + dysrhythmias  CABG 2001. Multiple interventions since. Myoview yesterday unchanged without anything acute. EF 55%   Neuro/Psych PSYCHIATRIC DISORDERS Anxiety TIA (H/O TIAs for CEA. Mild dementia) Neuromuscular disease    GI/Hepatic hiatal hernia, GERD-  ,  Endo/Other    Renal/GU      Musculoskeletal   Abdominal   Peds  Hematology   Anesthesia Other Findings   Reproductive/Obstetrics                          Anesthesia Physical Anesthesia Plan  ASA: III  Anesthesia Plan: General   Post-op Pain Management:    Induction: Intravenous  Airway Management Planned: Oral ETT  Additional Equipment: Arterial line  Intra-op Plan:   Post-operative Plan: Extubation in OR  Informed Consent: I have reviewed the patients History and Physical, chart, labs and discussed the procedure including the risks, benefits and alternatives for the proposed anesthesia with the patient or authorized representative who has indicated his/her understanding and acceptance.     Plan Discussed with: CRNA and Surgeon  Anesthesia Plan Comments:         Anesthesia Quick Evaluation

## 2011-04-14 NOTE — Op Note (Signed)
OPERATIVE REPORT  Date of Surgery: 04/12/2011 - 04/14/2011  Surgeon: Josephina Gip, MD  Assistant: Nurse  Pre-op Diagnosis: ICA STENOSIS LEFT-severe possible left brain TIA  Post-op Diagnosis: Same  Procedure: Procedure(s): ENDARTERECTOMY CAROTID-left with Dacron patch angioplasty  Anesthesia: General  EBL-100 cc  Complications: None  Procedure Details: The patient was taken to the operating room and placed in the supine position. Following induction of satisfactory general endotracheal anesthesia the left neck was prepped and draped in a routine sterile manner. Incision was made on the anterior border of the sternocleidomastoid muscle and carried down through the subcutaneous tissue and platysma using the Bovie. Care was taken not to injure the hypoglossal nerve.. The common internal and external carotid arteries were dissected free. There was a calcified atherosclerotic plaque at the carotid bifurcation extending up the internal carotid artery. A #10 shunt was then prepared and the patient was heparinized. The carotid vessels were occluded with vascular clamps. A longitudinal opening was made in the common carotid with a 15 blade extended up the internal carotid with the Potts scissors to a point distal to the disease. The plaque was approximately 90% stenotic in severity. The distal vessel appeared normal. Shunt was inserted without difficulty reestablishing flow in about 2 minutes. A standard endarterectomy was performed with an eversion endarterectomy of the external carotid. The plaque feathered off  the distal internal carotid artery nicely not requiring any tacking sutures. The lumen was thoroughly irrigated with heparinized saline and loose debris all carefully removed. The arterotomy was then closed with a patch using continuous 6-0 Prolene. Prior to completion of the  Closure the  shunt was removed after approximately 30 minutes of shunt time. Flow was then reestablished up the external  branch initially followed by the internal branch. Protamine was given to her reverse the heparin.Following adequate hemostasis the wound was irrigated with saline and closed in layers with Vicryl ain a subcuticular fashion. Sterile dressing was applied and the patient taken to the recovery room in stable condition. A Jackson-Pratt drain was brought out through an inferiorly based stab wound and secured with a nylon suture because of some oozing due to the patient being on Plavix.  Josephina Gip, MD 04/14/2011 9:34 AM

## 2011-04-14 NOTE — Progress Notes (Signed)
Antibiotic doses reviewed for renal function; doses ordered appropriate for current renal function.  Pharmacy will sign off.  Thank you.  Junita Push, PharmD, BCPS

## 2011-04-14 NOTE — H&P (View-Only) (Signed)
Subjective:     Patient ID: [name] [name], male   DOB: 03/09/1940, 71 y.o.   MRN: [id number]  HPI this 71 year old male patient was referred by Dr. Jens Som for severe carotid occlusive disease. Last week the patient experienced visual scotomata but no blindness in either eye. He thinks it involved both eyes. He then developed acute loss of short-term memory being unable to remember his wife's name. This all resolved within one hour. He had no lateralizing weakness, aphasia, facial asymmetry, or syncope. He had no previous history of stroke or TIA. He does have a history of coronary artery disease with coronary artery bypass grafting performed in 2002 and PTCA and stenting a few years ago. He is on Plavix and aspirin. He does have frequent chest discomfort which is a chronic finding.  Past Medical History  Diagnosis Date  . Hypertension   . Prostate cancer   . Pancreatitis Dec 2011    ERCP ok.  . Hiatal hernia   . Coronary artery disease     s/p CABG in 2001, s/p DES to left main in the past as well as stenting of the LCX and RCA. Negative stress test in December 2011 but continued to have pain. S/P repeat cath showing patent radial graft to the DX with total occlusion of the distal limb to the LCX and a patent LIMA to LAD. Stentsin the left main, LCX & RCA were patent but did have DES to the LCX for high grade bifurcational AV/OM   . Chronic chest pain   . Left bundle branch block   . Carotid disease, bilateral   . GERD (gastroesophageal reflux disease)   . Hyperlipidemia   . Inguinal hernia   . Statin intolerance   . Anxiety disorder   . Peripheral vascular disease 04/10/11    History  Substance Use Topics  . Smoking status: Never Smoker   . Smokeless tobacco: Current User    Types: Chew  . Alcohol Use: No    Family History  Problem Relation Age of Onset  . Heart disease Mother     Heart Disease before age 95  . Hypertension Mother   . Cancer Father   . Hypertension Sister     . Heart disease Sister     Heart Disease before age 66  . Hypertension Brother   . Heart disease Brother     Heart Disease before age 22  . Heart attack Brother     Allergies  Allergen Reactions  . Codeine   . Hydrocodone   . MWN:UUVOZDGUYQI+HKVQQVZDG+LOVFIEPPIR Acid+Aspartame     REACTION: 'Burns' Stomach  . Morphine   . Nitrofuran Derivatives   . Other     Mycins  . Oxycodone Hcl   . Shellfish Allergy   . Tramadol   . Zolpidem Tartrate     Current outpatient prescriptions:amLODipine (NORVASC) 5 MG tablet, Take 2.5 mg by mouth 2 (two) times daily., Disp: , Rfl: ;  aspirin EC 81 MG tablet, Take 81 mg by mouth daily., Disp: , Rfl: ;  clopidogrel (PLAVIX) 75 MG tablet, Take 1 tablet (75 mg total) by mouth daily., Disp: 30 tablet, Rfl: 11;  diphenhydrAMINE (BENADRYL) 25 MG tablet, Take 25 mg by mouth at bedtime. sleep, Disp: , Rfl:  enalapril (VASOTEC) 20 MG tablet, Take 1 tablet (20 mg total) by mouth 2 (two) times daily., Disp: 60 tablet, Rfl: 1;  isosorbide mononitrate (IMDUR) 60 MG 24 hr tablet, TAKE 1/2 TABLET TWICE DAILY, Disp: 30 tablet, Rfl:  0;  metoCLOPramide (REGLAN) 10 MG tablet, Take 5 mg by mouth 4 (four) times daily as needed. Stomach pain, Disp: , Rfl: ;  metoprolol (LOPRESSOR) 50 MG tablet, Take 75 mg by mouth 2 (two) times daily., Disp: , Rfl:  nitroGLYCERIN (NITROSTAT) 0.4 MG SL tablet, Place 0.4 mg under the tongue every 5 (five) minutes as needed. Chest pain, Disp: , Rfl: ;  omega-3 acid ethyl esters (LOVAZA) 1 G capsule, Take 2 g by mouth 2 (two) times daily., Disp: , Rfl: ;  pantoprazole (PROTONIX) 40 MG tablet, Take 40 mg by mouth daily., Disp: , Rfl:   BP 166/68  Pulse 69  Resp 16  Ht 5\' 4"  (1.626 m)  Wt 142 lb (64.411 kg)  BMI 24.37 kg/m2  SpO2 99%  Body mass index is 24.37 kg/(m^2).            Review of Systems has mild daily chest discomfort, dyspnea on exertion but denies PND or orthopnea. No hemoptysis. No chronic cough. Denies claudication  symptoms in lower extremities. Other systems are negative and complete review of systems    Objective:   Physical Exam blood pressure 160/68 heart rate 69 respirations 16 Gen.-alert and oriented x3 in no apparent distress HEENT normal for age Lungs no rhonchi or wheezing Cardiovascular regular rhythm no murmurs carotid pulses 3+ palpable-harsh bruit on the left no bruit on the right Abdomen soft nontender no palpable masses Musculoskeletal free of  major deformities Skin clear -no rashes Neurologic normal Lower extremities 3+ femoral and 3+ right PT and 2+ left DP  Today I reviewed the carotid duplex exam performed by Dr. Ludwig Clarks office. Peak systolic velocity of the, left internal carotid is 539 cm/s with end-diastolic velocity of 113 cm per sec this is consistent with 95% left internal carotid stenosis and mild right internal carotid stenosis.      Assessment:     Severe left internal carotid stenosis with likely TIA 5 days ago no recurrence    Plan:     Discussed situation with Dr. Jens Som who will perform Myoview nuclear medicine scan this week and if abnormal he will then see patient Plan left carotid endarterectomy next week March 27 risks  and benefits including stroke and/or death were discussed with patient and wife and they understand and agree to proceed Will discontinue Plavix today as per Dr. Ludwig Clarks recommendation and will continue aspirin

## 2011-04-14 NOTE — OR Nursing (Signed)
Dr Michelle Piper notified of persistent nausea/wretching/ orders rec'd

## 2011-04-14 NOTE — Progress Notes (Signed)
   CARE MANAGEMENT NOTE 04/14/2011  Patient:  [name] [name], [name] [name]   Account Number:  [id number]  Date Initiated:  04/14/2011  Documentation initiated by:  GRAVES-BIGELOW,Arles Rumbold  Subjective/Objective Assessment:   Pt admiited for for evaluation of chest pain. Plan for endarterectomy 04-14-11.     Action/Plan:   Anticipated DC Date:  04/17/2011   Anticipated DC Plan:  HOME/SELF CARE         Choice offered to / List presented to:             Status of service:  In process, will continue to follow Medicare Important Message given?   (If response is "NO", the following Medicare IM given date fields will be blank) Date Medicare IM given:   Date Additional Medicare IM given:    Discharge Disposition:    Per UR Regulation:    If discussed at Long Length of Stay Meetings, dates discussed:    Comments:  04-14-11 1105  Tomi Bamberger, RN,BSN [phone] CM will continue to monitor for d/c disposition.

## 2011-04-14 NOTE — Progress Notes (Signed)
@   Subjective:  Denies CP or dyspnea; nauseated s/p CABG   Objective:  Filed Vitals:   04/13/11 2006 04/14/11 0526 04/14/11 0936 04/14/11 1015  BP: 153/84 138/74    Pulse: 67 67    Temp:  97.4 F (36.3 C) 98 F (36.7 C) 97.6 F (36.4 C)  TempSrc: Oral Oral    Resp: 18 18    Height:      Weight:  136 lb 14.5 oz (62.1 kg)    SpO2: 97% 95% 97%     Intake/Output from previous day:  Intake/Output Summary (Last 24 hours) at 04/14/11 1256 Last data filed at 04/14/11 1610  Gross per 24 hour  Intake   1640 ml  Output    725 ml  Net    915 ml    Physical Exam: Physical exam: Well-developed well-nourished in no acute distress.  Skin is warm and dry.  HEENT is normal.  Neck is supple. S/p CABG Chest is clear to auscultation with normal expansion.  Cardiovascular exam is regular rate and rhythm.  Abdominal exam nontender or distended. No masses palpated. Extremities show no edema. neuro grossly intact    Lab Results: Basic Metabolic Panel:  Basename 04/13/11 2014 04/13/11 0650  NA 140 142  K 4.0 3.7  CL 105 106  CO2 27 26  GLUCOSE 99 100*  BUN 12 14  CREATININE 1.03 0.96  CALCIUM 9.2 8.9  MG -- --  PHOS -- --   CBC:  Basename 04/13/11 2014 04/13/11 0650 04/12/11 2302  WBC 5.0 4.6 --  NEUTROABS 3.0 -- 3.9  HGB 13.1 13.0 --  HCT 37.2* 36.7* --  MCV 86.5 86.2 --  PLT 192 167 --   Cardiac Enzymes:  Basename 04/14/11 0544 04/14/11 0053 04/13/11 2014  CKTOTAL 82 71 79  CKMB 2.9 3.2 3.3  CKMBINDEX -- -- --  TROPONINI <0.30 <0.30 <0.30     Assessment/Plan:  #1 chest pain-All enzymes neg; myoview with no ischemia; plan continue medical therapy #2-cerebrovascular disease-s/p CABG; management per vascular surgery. #3-hypertension-continue present medications and follow. #4-hyperlipidemia-statin intolerant. Olga Millers 04/14/2011, 12:56 PM

## 2011-04-14 NOTE — Interval H&P Note (Signed)
History and Physical Interval Note:  04/14/2011 7:34 AM  [name] [name]  has presented today for surgery, with the diagnosis of ICA STENOSIS LEFT  The various methods of treatment have been discussed with the patient and family. After consideration of risks, benefits and other options for treatment, the patient has consented to  Procedure(s) (LRB): ENDARTERECTOMY CAROTID (Left) as a surgical intervention .  The patients' history has been reviewed, patient examined, no change in status, stable for surgery.  I have reviewed the patients' chart and labs.  Questions were answered to the patient's satisfaction.     Josephina Gip

## 2011-04-14 NOTE — Transfer of Care (Signed)
Immediate Anesthesia Transfer of Care Note  Patient: [name] [name]  Procedure(s) Performed: Procedure(s) (LRB): ENDARTERECTOMY CAROTID (Left)  Patient Location: PACU  Anesthesia Type: General  Level of Consciousness: awake, alert  and oriented  Airway & Oxygen Therapy: Patient Spontanous Breathing and Patient connected to nasal cannula oxygen  Post-op Assessment: Report given to PACU RN, Post -op Vital signs reviewed and stable and Patient moving all extremities X 4  Post vital signs: Reviewed and stable  Complications: No apparent anesthesia complications

## 2011-04-15 DIAGNOSIS — R079 Chest pain, unspecified: Secondary | ICD-10-CM | POA: Diagnosis not present

## 2011-04-15 LAB — BASIC METABOLIC PANEL
Calcium: 8.2 mg/dL — ABNORMAL LOW (ref 8.4–10.5)
GFR calc Af Amer: 82 mL/min — ABNORMAL LOW (ref >90)
GFR calc non Af Amer: 71 mL/min — ABNORMAL LOW (ref >90)
Potassium: 4.3 mEq/L (ref 3.5–5.1)
Sodium: 140 mEq/L (ref 135–145)

## 2011-04-15 LAB — CBC
Hemoglobin: 10.6 g/dL — ABNORMAL LOW (ref 13.0–17.0)
Platelets: 146 10*3/uL — ABNORMAL LOW (ref 150–400)
RBC: 3.45 MIL/uL — ABNORMAL LOW (ref 4.22–5.81)
WBC: 9.2 10*3/uL (ref 4.0–10.5)

## 2011-04-15 LAB — CARDIAC PANEL(CRET KIN+CKTOT+MB+TROPI)
CK, MB: 3.2 ng/mL (ref 0.3–4.0)
Relative Index: 3.1 — ABNORMAL HIGH (ref 0.0–2.5)
Relative Index: INVALID (ref 0.0–2.5)
Relative Index: 2.8 — ABNORMAL HIGH (ref 0.0–2.5)
Total CK: 103 U/L (ref 7–232)
Total CK: 91 U/L (ref 7–232)
Total CK: 123 U/L (ref 7–232)
Troponin I: <0.3 ng/mL (ref <0.30)

## 2011-04-15 MED ORDER — TAMSULOSIN HCL 0.4 MG PO CAPS: 0.4000 mg | ORAL_CAPSULE | Freq: Every day | ORAL | Status: DC

## 2011-04-15 MED ORDER — DSS 100 MG PO CAPS: 100.0000 mg | ORAL_CAPSULE | Freq: Every day | ORAL | Status: AC

## 2011-04-15 NOTE — Discharge Summary (Signed)
Physician Discharge Summary  Patient ID: [name] [name] MRN: [id number] DOB/AGE: 1940/05/12 71 y.o.  Admit date: 04/12/2011 Discharge date: 04/15/2011  Primary Discharge Diagnosis: Chest Pain  Secondary Discharge Diagnosis: 1. Carotid Artery Disease: s/p Left Carotid Endarterectomy 04/14/2011 2. CAD: s/p CABG 2001, DES of LCX and RCA. 3.Prostate Cancer 4. Hypertension 5. GERD  Consults: [facility] Significant Diagnostic Testing: Stress Myoview 04/13/2011 Small fixed non reversible perfusion defect/scar versus apical thinning at the septal portion of left to right apex. Normal left ventricular ejection fraction of 64%. Anterior septal hypokinesia. No significant interval change.    Hospital Course: [name] is a 71 year old patient of Dr. Olga Millers with known history of coronary artery disease, carotid artery disease, with severe left ICA stenosis, and mid right ICA stenosis, who presented to [facility] ER with complaints of chest pain. Patient took 3 nitroglycerin and had immediate relief. He was due to have planned surgery for carotid artery disease with endarterectomy on 04/19/2011. We admitted him to rule out myocardial infarction. He was found to be ruled out without any evidence of ischemia. A stress Myoview was completed on 3 02/13/2011 revealing a small fixed. No reversible perfusion defect/scar vs. apical thinning at the septal portion of the right apex. No significant interval change from prior stress test. Dr. Hart Rochester was consulted and the patient went ahead and had carotid endarterectomy on 04/14/2011 during this admission. He recovered well and was placed in ICU for observation post procedure. There were no complications. He was seen and examined by Dr. Sherryl Manges on day of discharge and found to be stable. He spoke with Dr. Hart Rochester in [facility] who also stated that he could go home today. Followup appointments were scheduled for Dr. Hart Rochester [facility]  and he will see Dr. Jens Som in 4  months. Post operative instructions were also provided by Dr. Candie Chroman team. He knows to call sooner for any other complaints.   Discharge Vitals: Blood pressure 136/56, pulse 65, temperature 98.9 F (37.2 C), temperature source Oral, resp. rate 16, height 5\' 4"  (1.626 m), weight 142 lb 13.7 oz (64.8 kg), SpO2 99.00%.   Labs:   Lab Results  Component Value Date   WBC 9.2 04/15/2011   HGB 10.6* 04/15/2011   HCT 29.9* 04/15/2011   MCV 86.7 04/15/2011   PLT 146* 04/15/2011     Lab 04/15/11 0500 04/13/11 2014  NA 140 --  K 4.3 --  CL 110 --  CO2 22 --  BUN 12 --  CREATININE 1.04 --  CALCIUM 8.2* --  PROT -- 6.2  BILITOT -- 1.4*  ALKPHOS -- 44  ALT -- 13  AST -- 16  GLUCOSE 132* --   Lab Results  Component Value Date   CKTOTAL 123 04/15/2011   CKMB 3.4 04/15/2011   TROPONINI <0.30 04/15/2011    Lab Results  Component Value Date   CHOL  Value: 281        ATP III CLASSIFICATION:  <200     mg/dL   Desirable  409-811  mg/dL   Borderline High  >=914    mg/dL   High       * 78/29/5621   CHOL  Value: 275        ATP III CLASSIFICATION:  <200     mg/dL   Desirable  308-657  mg/dL   Borderline High  >=846    mg/dL   High       * 09/29/2950   CHOL  Value: 228  ATP III CLASSIFICATION:  <200     mg/dL   Desirable  324-401  mg/dL   Borderline High  >=027    mg/dL   High* 25/03/6642   Lab Results  Component Value Date   HDL 37* 01/07/2010   HDL 35* 02/28/2009   HDL 27* 12/30/2007   Lab Results  Component Value Date   LDLCALC  Value: 206        Total Cholesterol/HDL:CHD Risk Coronary Heart Disease Risk Table                     Men   Women  1/2 Average Risk   3.4   3.3  Average Risk       5.0   4.4  2 X Average Risk   9.6   7.1  3 X Average Risk  23.4   11.0        Use the calculated Patient Ratio above and the CHD Risk Table to determine the patient's CHD Risk.        ATP III CLASSIFICATION (LDL):  <100     mg/dL   Optimal  034-742  mg/dL   Near or Above                    Optimal  130-159   mg/dL   Borderline  595-638  mg/dL   High  >756     mg/dL   Very High* 43/32/9518   LDLCALC  Value: 209        Total Cholesterol/HDL:CHD Risk Coronary Heart Disease Risk Table                     Men   Women  1/2 Average Risk   3.4   3.3  Average Risk       5.0   4.4  2 X Average Risk   9.6   7.1  3 X Average Risk  23.4   11.0        Use the calculated Patient Ratio above and the CHD Risk Table to determine the patient's CHD Risk.        ATP III CLASSIFICATION (LDL):  <100     mg/dL   Optimal  841-660  mg/dL   Near or Above                    Optimal  130-159  mg/dL   Borderline  630-160  mg/dL   High  >109     mg/dL   Very High* 03/25/3555   LDLCALC  Value: 176        Total Cholesterol/HDL:CHD Risk Coronary Heart Disease Risk Table                     Men   Women  1/2 Average Risk   3.4   3.3* 12/30/2007   Lab Results  Component Value Date   TRIG 192* 01/07/2010   TRIG 153* 02/28/2009   TRIG 126 12/30/2007   Lab Results  Component Value Date   CHOLHDL 7.6 01/07/2010   CHOLHDL 7.9 02/28/2009   CHOLHDL 8.4 12/30/2007   Lab Results  Component Value Date   LDLDIRECT 178.8 04/02/2007      Radiology: Dg Chest 2 View  04/12/2011  *RADIOLOGY REPORT*  Clinical Data: Chest pain  CHEST - 2 VIEW  Comparison: 03/04/2011  Findings: The heart size appears within normal limits.  Prior median sternotomy and CABG procedure.  No pleural effusion or pulmonary edema.  No airspace consolidation is identified.  There is mild spondylosis identified within the thoracic spine.  IMPRESSION:  1.  No acute cardiopulmonary abnormalities.  Original Report Authenticated By: Rosealee Albee, M.D.   Ct Head Wo Contrast  04/07/2011  *RADIOLOGY REPORT*  Clinical Data: Headache.  Memory loss.  History prostate cancer.  CT HEAD WITHOUT CONTRAST  Technique:  Contiguous axial images were obtained from the base of the skull through the vertex without contrast.  Comparison: 08/25/2008.  08/24/2008.  Findings: The brain shows mild age  related atrophy.  No evidence of focal old or acute small or large vessel infarction.  No mass lesion, hemorrhage, hydrocephalus or extra-axial collection.  There is atherosclerotic calcification of the major vessels at the base the brain.  The calvarium is unremarkable.  Sinuses, middle ears and mastoids are clear.  IMPRESSION: No acute or focal finding.  Age related atrophy.  Atherosclerosis.  Original Report Authenticated By: Thomasenia Sales, M.D.   Nm Myocar Multi W/spect W/wall Motion / Ef  04/13/2011  *RADIOLOGY REPORT*  Clinical Data:  Chest pain, hypertension, shortness of breath, hyperlipidemia, left bundle branch block, coronary disease post CABG  MYOCARDIAL IMAGING WITH SPECT (REST AND PHARMACOLOGIC-STRESS) GATED LEFT VENTRICULAR WALL MOTION STUDY LEFT VENTRICULAR EJECTION FRACTION  Technique:  Standard myocardial SPECT imaging was performed after resting intravenous injection of 10 mCi Tc-53m tetrofosmin. Subsequently, intravenous infusion of Lexiscan was performed under the supervision of the Cardiology staff.  At peak effect of the drug, 30 mCi Tc-45m tetrofosmin was injected intravenously and standard myocardial SPECT  imaging was performed.  Quantitative gated imaging was also performed to evaluate left ventricular wall motion, and estimate left ventricular ejection fraction.  Comparison:  01/08/2010  Findings: Myocardial fusion SPECT images obtained following pharmacologic stress demonstrate a subtle area of diminished perfusion at the septal aspect of the left ventricular apex. This area is unchanged on resting images. This could represent apical thinning or small fixed by myocardial perfusion defect/scar. Remaining perfusion is normal. No areas of reversible decreased perfusion are identified.  Normal left ventricular ejection fraction of 64% calculated gated SPECT images following pathologic stress. This is derived from an end-diastolic volume calculation of 67 ml and end systolic volume of 24  ml. Wall motion analysis of the gated SPECT images obtained following pharmacologic stress demonstrates hypokinesia of the anterior aspect of the interventricular septum.  IMPRESSION: Small fixed non reversible perfusion defect/scar versus apical thinning at the septal portion of left to right apex. Normal left ventricular ejection fraction of 64%. Anterior septal hypokinesia. No significant interval change.  Original Report Authenticated By: Lollie Marrow, M.D.   Dg Chest Port 1 View  04/12/2011  *RADIOLOGY REPORT*  Clinical Data: Chest pain and SOB  PORTABLE CHEST - 1 VIEW  Comparison: 04/13/2011  Findings: Prior median sternotomy CABG procedure.  Heart size is normal.  No pleural effusion or edema.  No airspace consolidation.  IMPRESSION:  1.  No acute findings.  Original Report Authenticated By: Rosealee Albee, M.D.    FOLLOW UP PLANS AND APPOINTMENTS Discharge Orders    Future Orders Please Complete By Expires   Diet - low sodium heart healthy      Lifting restrictions      Comments:   No lifting for 6 weeks   Call MD for:  temperature >100.5      Call MD for:  redness, tenderness, or signs of infection (pain, swelling, bleeding, redness, odor or  green/yellow discharge around incision site)      Call MD for:  severe or increased pain, loss or decreased feeling  in affected limb(s)      May shower       Scheduling Instructions:   Sunday   CAROTID Sugery: Call MD for difficulty swallowing or speaking; weakness in arms or legs that is a new symtom; severe headache.  If you have increased swelling in the neck and/or  are having difficulty breathing, CALL 911      Increase activity slowly        Medication List  As of 04/15/2011  1:05 PM   TAKE these medications         amLODipine 5 MG tablet   Commonly known as: NORVASC   Take 2.5 mg by mouth 2 (two) times daily.      aspirin EC 81 MG tablet   Take 81 mg by mouth daily.      clopidogrel 75 MG tablet   Commonly known as: PLAVIX    Take 1 tablet (75 mg total) by mouth daily.      diphenhydrAMINE 25 MG tablet   Commonly known as: BENADRYL   Take 25 mg by mouth at bedtime. sleep      DSS 100 MG Caps   Take 100 mg by mouth daily.      enalapril 20 MG tablet   Commonly known as: VASOTEC   Take 1 tablet (20 mg total) by mouth 2 (two) times daily.      isosorbide mononitrate 60 MG 24 hr tablet   Commonly known as: IMDUR   Take 30 mg by mouth 2 (two) times daily.      metoCLOPramide 10 MG tablet   Commonly known as: REGLAN   Take 5 mg by mouth 4 (four) times daily as needed. Stomach pain      metoprolol 50 MG tablet   Commonly known as: LOPRESSOR   Take 75 mg by mouth 2 (two) times daily.      nitroGLYCERIN 0.4 MG SL tablet   Commonly known as: NITROSTAT   Place 0.4 mg under the tongue every 5 (five) minutes as needed. Chest pain      omega-3 acid ethyl esters 1 G capsule   Commonly known as: LOVAZA   Take 1 g by mouth 2 (two) times daily.      pantoprazole 40 MG tablet   Commonly known as: PROTONIX   Take 40 mg by mouth 2 (two) times daily.      Tamsulosin HCl 0.4 MG Caps   Commonly known as: FLOMAX   Take 1 capsule (0.4 mg total) by mouth daily after supper.           Follow-up Information    Follow up with [facility], MD in 2 weeks.      Follow up with Josephina Gip, MD in 2 weeks. (office will arrange - sent)    Contact information:   [address] [place] [address] [phone]       Follow up with Olga Millers, MD. (Our office will call you for appointment  for 4 months)    Contact information:   [address] [place], [address] [place] [address] [phone]            Time spent with patient to include physician time:35 Signed: Joni Reining 04/15/2011, 1:05 PM Co-Sign MD

## 2011-04-15 NOTE — Progress Notes (Signed)
Pt d/c home, per MD order, d/c instructions given, family at [facility], prescriptions given, pt verbalized understanding of d/c, all question answered

## 2011-04-15 NOTE — Progress Notes (Signed)
Physical Therapy Evaluation Patient Details Name: [name] [name] MRN: [id number] DOB: Jul 31, 1940 Today's Date: 04/15/2011  Problem List:  Patient Active Problem List  Diagnoses  . ADENOCARCINOMA, PROSTATE  . HYPERCHOLESTEROLEMIA, PURE  . HYPERTENSION, BENIGN  . CAD, ARTERY BYPASS GRAFT  . LBBB  . CAROTID ARTERY STENOSIS, WITHOUT INFARCTION  . GERD  . CHEST PAIN, NON-CARDIAC  . DYSPHAGIA UNSPECIFIED  . COLONIC POLYPS, HX OF  . ANXIETY  . ABDOMINAL PAIN-EPIGASTRIC  . Memory loss  . Chest pain    Past Medical History:  Past Medical History  Diagnosis Date  . Hypertension   . Prostate cancer   . Pancreatitis Dec 2011    ERCP ok.  . Hiatal hernia   . Coronary artery disease     s/p CABG in 2001, s/p DES to left main in the past as well as stenting of the LCX and RCA. Negative stress test in December 2011 but continued to have pain. S/P repeat cath showing patent radial graft to the DX with total occlusion of the distal limb to the LCX and a patent LIMA to LAD. Stentsin the left main, LCX & RCA were patent but did have DES to the LCX for high grade bifurcational AV/OM   . Chronic chest pain   . Left bundle branch block   . Carotid disease, bilateral   . GERD (gastroesophageal reflux disease)   . Hyperlipidemia   . Inguinal hernia   . Statin intolerance   . Anxiety disorder   . Peripheral vascular disease 04/10/11   Past Surgical History:  Past Surgical History  Procedure Date  . Heart stents   . Cholecystectomy   . Inguinal hernia repair     bilateral  . Coronary artery bypass graft 2001    PT Assessment/Plan/Recommendation PT Assessment Clinical Impression Statement: Pt presents with a medical diagnosis of chest pain. Pt is at his baseline functional level for both strength, mobility and balance. No acute PT needed. PT Recommendation/Assessment: Patent does not need any further PT services No Skilled PT: Patient at baseline level of functioning;All education  completed PT Recommendation Follow Up Recommendations: No PT follow up Equipment Recommended: None recommended by PT PT Goals     PT Evaluation Precautions/Restrictions  Restrictions Weight Bearing Restrictions: No Prior Functioning  Home Living Lives With: Alone Receives Help From: Family Type of Home: House Bathroom Shower/Tub: Tub/shower unit;Curtain Bathroom Toilet: Standard Home Adaptive Equipment: None Prior Function Level of Independence: Independent with basic ADLs;Independent with homemaking with ambulation;Independent with gait;Independent with transfers Able to Take Stairs?: Yes Driving: Yes Vocation: Retired Producer, television/film/video: Awake/alert Overall Cognitive Status: Appears within functional limits for tasks assessed Orientation Level: Oriented X4 Sensation/Coordination Sensation Light Touch: Appears Intact Coordination Gross Motor Movements are Fluid and Coordinated: Yes Extremity Assessment RLE Assessment RLE Assessment: Within Functional Limits LLE Assessment LLE Assessment: Within Functional Limits Mobility (including Balance) Bed Mobility Bed Mobility: No Transfers Transfers: Yes Sit to Stand: 7: Independent Stand to Sit: 7: Independent Ambulation/Gait Ambulation/Gait: Yes Ambulation/Gait Assistance: 7: Independent Ambulation Distance (Feet): 150 Feet Assistive device: None Gait Pattern: Within Functional Limits Gait velocity: normal gait speed Stairs: No  Balance Balance Assessed: Yes High Level Balance High Level Balance Activites: Direction changes;Turns;Other (comment) (tandem walking) High Level Balance Comments: no difficulties with balance Exercise    End of Session PT - End of Session Equipment Utilized During Treatment: Gait belt Activity Tolerance: Patient tolerated treatment well Patient left: in chair;with call bell in reach;with family/visitor present Nurse  Communication: Mobility status for  transfers;Mobility status for ambulation General Behavior During Session: [facility] for tasks performed Cognition: [facility] for tasks performed  Milana Kidney 04/15/2011, 4:30 PM  04/15/2011 Milana Kidney DPT PAGER: [phone] OFFICE: [phone]

## 2011-04-15 NOTE — Progress Notes (Addendum)
VASCULAR AND VEIN SURGERY POST - OP CEA PROGRESS NOTE  Date of Surgery: 04/12/2011 - 04/14/2011 Surgeon: Moishe Spice): Pryor Ochoa, MD 1 Day Post-Op left Carotid Endarterectomy .  HPI: [name] [name] is a 71 y.o. male who is 1 Day Post-Op left Carotid Endarterectomy . Patient is doing well. Patient reports mild intermittent headache; Patient denies difficulty swallowing; but had nausea vomiting last PM - has hiatal hernia and states routinely vomits up to one time per day at home denies weakness in upper or lower extremities; Pt. denies other symptoms of stroke or TIA.  Significant Diagnostic Studies: CBC Lab Results  Component Value Date   WBC 9.2 04/15/2011   HGB 10.6* 04/15/2011   HCT 29.9* 04/15/2011   MCV 86.7 04/15/2011   PLT 146* 04/15/2011    BMET    Component Value Date/Time   NA 140 04/15/2011 0500   K 4.3 04/15/2011 0500   CL 110 04/15/2011 0500   CO2 22 04/15/2011 0500   GLUCOSE 132* 04/15/2011 0500   BUN 12 04/15/2011 0500   CREATININE 1.04 04/15/2011 0500   CREATININE 1.26 04/07/2011 1549   CALCIUM 8.2* 04/15/2011 0500   GFRNONAA 71* 04/15/2011 0500   GFRAA 82* 04/15/2011 0500    COAG Lab Results  Component Value Date   INR 1.03 04/13/2011   INR 1.01 04/13/2011   INR 0.98 04/12/2011   No results found for this basename: PTT      Intake/Output Summary (Last 24 hours) at 04/15/11 0802 Last data filed at 04/15/11 0600  Gross per 24 hour  Intake 2893.33 ml  Output   1565 ml  Net 1328.33 ml   JP drain out put 40cc since surgery  Physical Exam:  BP Readings from Last 3 Encounters:  04/15/11 111/62  04/15/11 111/62  04/11/11 166/68   Temp Readings from Last 3 Encounters:  04/15/11 97.5 F (36.4 C) Oral  04/15/11 97.5 F (36.4 C) Oral  03/05/11 97.9 F (36.6 C) Oral   SpO2 Readings from Last 3 Encounters:  04/15/11 98%  04/15/11 98%  04/11/11 99%   Pulse Readings from Last 3 Encounters:  04/15/11 60  04/15/11 60  04/11/11 69    Pt is A&O x  3 Gait is normal Speech is fluent left Neck Wound is healing well Patient with Negative tongue deviation and Negative facial droop Pt has good and equal strength in all extremities  Assessment: [name] [name] is a 71 y.o. male is S/P Left Carotid endarterectomy Neuro intact Pt is  ambulating  Hx Hiatal hernia eats small meal throughout the day at home Hx prostate CA - no diff voiding at home - needed foley placed yest afternoon for retention - will DC and see if he can void- flomax started   Plan: Discharge to: Home when cleared by med service Trial of Foley out DC JP drain Follow-up in 2 weeks -Dr. Hart Rochester - our office will arrange   Marlowe Shores [phone] 04/15/2011 8:02 AM   Neuro at base line D/c JP OK to d/c home  Wells Keaun Schnabel

## 2011-04-15 NOTE — Progress Notes (Signed)
Patient Name: [name] [name]      SUBJECTIVE: admitted with chest pain with known cad and prior CABG  Ez neg, myoview with no ischemia EF 64% Now s/p CEA  Ok for discharge per [facility]  Past Medical History  Diagnosis Date  . Hypertension   . Prostate cancer   . Pancreatitis Dec 2011    ERCP ok.  . Hiatal hernia   . Coronary artery disease     s/p CABG in 2001, s/p DES to left main in the past as well as stenting of the LCX and RCA. Negative stress test in December 2011 but continued to have pain. S/P repeat cath showing patent radial graft to the DX with total occlusion of the distal limb to the LCX and a patent LIMA to LAD. Stentsin the left main, LCX & RCA were patent but did have DES to the LCX for high grade bifurcational AV/OM   . Chronic chest pain   . Left bundle branch block   . Carotid disease, bilateral   . GERD (gastroesophageal reflux disease)   . Hyperlipidemia   . Inguinal hernia   . Statin intolerance   . Anxiety disorder   . Peripheral vascular disease 04/10/11    PHYSICAL EXAM Filed Vitals:   04/14/11 2320 04/15/11 0408 04/15/11 0600 04/15/11 0810  BP: 147/60 111/62  136/56  Pulse: 87 60  65  Temp: 98.2 F (36.8 C) 97.5 F (36.4 C)  98.9 F (37.2 C)  TempSrc: Oral Oral  Oral  Resp: 29 12  16   Height:      Weight:   142 lb 13.7 oz (64.8 kg)   SpO2: 98% 98%  99%   2.pes Well developed and nourished in no acute distress HENT normal Neck supple with JVP-flat some swelling at surgical site Clear Regular rate and rhythm, no murmurs or gallops Abd-soft with active BS No Clubbing cyanosis edema Skin-warm and dry A & Oriented  Grossly normal sensory and motor function   TELEMETRY: Reviewed telemetry pt in nsr :    Intake/Output Summary (Last 24 hours) at 04/15/11 1152 Last data filed at 04/15/11 1000  Gross per 24 hour  Intake 1893.33 ml  Output   2665 ml  Net -771.67 ml    LABS: Basic Metabolic Panel:  Lab 04/15/11 1610 04/13/11 2014 04/13/11  0650 04/12/11 2302 04/12/11 2022 04/12/11 1901  NA 140 140 142 141 143 144  K 4.3 4.0 3.7 3.6 3.8 4.0  CL 110 105 106 103 102 107  CO2 22 27 26 26 25  --  GLUCOSE 132* 99 100* 116* 83 93  BUN 12 12 14 13 14 16   CREATININE 1.04 1.03 0.96 1.01 1.03 1.20  CALCIUM 8.2* 9.2 -- -- -- --  MG -- -- -- -- -- --  PHOS -- -- -- -- -- --   Cardiac Enzymes:  Basename 04/15/11 1000 04/15/11 0500 04/14/11 2128  CKTOTAL 123 91 103  CKMB 3.4 3.1 3.2  CKMBINDEX -- -- --  TROPONINI <0.30 <0.30 <0.30   CBC:  Lab 04/15/11 0500 04/13/11 2014 04/13/11 0650 04/12/11 2302 04/12/11 1901 04/12/11 1841  WBC 9.2 5.0 4.6 6.1 -- 5.1  NEUTROABS -- 3.0 -- 3.9 -- --  HGB 10.6* 13.1 13.0 12.9* 14.6 13.6  HCT 29.9* 37.2* 36.7* 35.8* 43.0 39.8  MCV 86.7 86.5 86.2 85.4 -- 87.5  PLT 146* 192 167 162 -- 181   PROTIME:  Basename 04/13/11 2014 04/13/11 0650 04/12/11 2155  LABPROT 13.7 13.5  13.2  INR 1.03 1.01 0.98   Liver Function Tests:  Basename 04/13/11 2014 04/12/11 2302  AST 16 16  ALT 13 11  ALKPHOS 44 43  BILITOT 1.4* 1.6*  PROT 6.2 6.2  ALBUMIN 3.6 3.6     S/p CEA  Ok for discharge on preadmission meds  F/u w BC in 4 months apparently not on statin because of intolerance  Signed, Sherryl Manges MD  04/15/2011

## 2011-04-17 ENCOUNTER — Encounter (HOSPITAL_COMMUNITY): Payer: Self-pay | Admitting: Vascular Surgery

## 2011-04-17 ENCOUNTER — Other Ambulatory Visit (HOSPITAL_COMMUNITY): Payer: Medicare Other

## 2011-04-17 ENCOUNTER — Other Ambulatory Visit: Payer: Self-pay | Admitting: Cardiology

## 2011-05-01 ENCOUNTER — Encounter: Payer: Self-pay | Admitting: Vascular Surgery

## 2011-05-02 ENCOUNTER — Ambulatory Visit (INDEPENDENT_AMBULATORY_CARE_PROVIDER_SITE_OTHER): Payer: Medicare Other | Admitting: Vascular Surgery

## 2011-05-02 ENCOUNTER — Ambulatory Visit: Payer: Medicare Other | Admitting: Vascular Surgery

## 2011-05-02 ENCOUNTER — Encounter: Payer: Self-pay | Admitting: Vascular Surgery

## 2011-05-02 VITALS — BP 103/64 | HR 77 | Resp 18 | Ht 64.0 in | Wt 143.0 lb

## 2011-05-02 DIAGNOSIS — I6529 Occlusion and stenosis of unspecified carotid artery: Secondary | ICD-10-CM

## 2011-05-02 NOTE — Progress Notes (Signed)
Subjective:     Patient ID: [name] [name], male   DOB: 1940-12-22, 71 y.o.   MRN: [id number]  HPI 71 year old male patient returns for initial followup regarding his left carotid endarterectomy which was performed on 04/14/2011 for severe greater than 90% left internal carotid stenosis with possible left brain TIAs consisting of visual abnormalities. He has a history of stroke. He's had no further visual episodes since his discharge from the hospital he denies any hoarseness or difficulty swallowing or lateralizing weakness, aphasia, diplopia, blurred vision, or syncope.  Review of Systems     Objective:   Physical ExamBP 103/64  Pulse 77  Resp 18  Ht 5\' 4"  (1.626 m)  Wt 143 lb (64.864 kg)  BMI 24.55 kg/m2  General well-developed well-nourished male in no apparent distress Left neck incision healing nicely 3+ carotid pulse no audible bruits Neurologic exam normal    Assessment:     Doing well post left carotid endarterectomy for possible left brain TIAs with severe stenosis    Plan:     Return in 6 months for carotid duplex exam and see Dr. Hart Rochester for continued followup unless he develops any symptoms in the interim Will continue aspirin and Plavix

## 2011-05-03 NOTE — Progress Notes (Signed)
Addended by: Sharee Pimple on: 05/03/2011 09:18 AM   Modules accepted: Orders

## 2011-05-31 DIAGNOSIS — K219 Gastro-esophageal reflux disease without esophagitis: Secondary | ICD-10-CM | POA: Diagnosis not present

## 2011-05-31 DIAGNOSIS — R42 Dizziness and giddiness: Secondary | ICD-10-CM | POA: Diagnosis not present

## 2011-06-24 ENCOUNTER — Other Ambulatory Visit: Payer: Self-pay | Admitting: Cardiology

## 2011-07-04 DIAGNOSIS — K219 Gastro-esophageal reflux disease without esophagitis: Secondary | ICD-10-CM | POA: Diagnosis not present

## 2011-07-04 DIAGNOSIS — E782 Mixed hyperlipidemia: Secondary | ICD-10-CM | POA: Diagnosis not present

## 2011-07-04 DIAGNOSIS — I10 Essential (primary) hypertension: Secondary | ICD-10-CM | POA: Diagnosis not present

## 2011-09-04 ENCOUNTER — Other Ambulatory Visit: Payer: Self-pay | Admitting: Cardiology

## 2011-09-19 ENCOUNTER — Encounter (HOSPITAL_COMMUNITY): Payer: Self-pay | Admitting: *Deleted

## 2011-09-19 ENCOUNTER — Inpatient Hospital Stay (HOSPITAL_COMMUNITY)
Admission: EM | Admit: 2011-09-19 | Discharge: 2011-09-21 | DRG: 392 | Disposition: A | Discharge status: Home / Self Care | Payer: Medicare Other | Attending: Internal Medicine | Admitting: Internal Medicine

## 2011-09-19 DIAGNOSIS — K529 Noninfective gastroenteritis and colitis, unspecified: Secondary | ICD-10-CM

## 2011-09-19 DIAGNOSIS — R109 Unspecified abdominal pain: Secondary | ICD-10-CM | POA: Diagnosis present

## 2011-09-19 DIAGNOSIS — Z7982 Long term (current) use of aspirin: Secondary | ICD-10-CM

## 2011-09-19 DIAGNOSIS — C61 Malignant neoplasm of prostate: Secondary | ICD-10-CM | POA: Diagnosis not present

## 2011-09-19 DIAGNOSIS — I2510 Atherosclerotic heart disease of native coronary artery without angina pectoris: Secondary | ICD-10-CM | POA: Diagnosis present

## 2011-09-19 DIAGNOSIS — I10 Essential (primary) hypertension: Secondary | ICD-10-CM | POA: Diagnosis present

## 2011-09-19 DIAGNOSIS — K5289 Other specified noninfective gastroenteritis and colitis: Secondary | ICD-10-CM | POA: Diagnosis not present

## 2011-09-19 DIAGNOSIS — Z9861 Coronary angioplasty status: Secondary | ICD-10-CM

## 2011-09-19 DIAGNOSIS — R1084 Generalized abdominal pain: Secondary | ICD-10-CM | POA: Diagnosis not present

## 2011-09-19 DIAGNOSIS — F17200 Nicotine dependence, unspecified, uncomplicated: Secondary | ICD-10-CM | POA: Diagnosis present

## 2011-09-19 DIAGNOSIS — Z79899 Other long term (current) drug therapy: Secondary | ICD-10-CM

## 2011-09-19 DIAGNOSIS — Z881 Allergy status to other antibiotic agents status: Secondary | ICD-10-CM

## 2011-09-19 DIAGNOSIS — Z951 Presence of aortocoronary bypass graft: Secondary | ICD-10-CM

## 2011-09-19 DIAGNOSIS — Z7902 Long term (current) use of antithrombotics/antiplatelets: Secondary | ICD-10-CM

## 2011-09-19 DIAGNOSIS — Z91013 Allergy to seafood: Secondary | ICD-10-CM

## 2011-09-19 DIAGNOSIS — I739 Peripheral vascular disease, unspecified: Secondary | ICD-10-CM | POA: Diagnosis present

## 2011-09-19 DIAGNOSIS — I6529 Occlusion and stenosis of unspecified carotid artery: Secondary | ICD-10-CM | POA: Diagnosis present

## 2011-09-19 DIAGNOSIS — Z888 Allergy status to other drugs, medicaments and biological substances status: Secondary | ICD-10-CM

## 2011-09-19 DIAGNOSIS — R351 Nocturia: Secondary | ICD-10-CM | POA: Diagnosis not present

## 2011-09-19 DIAGNOSIS — I25810 Atherosclerosis of coronary artery bypass graft(s) without angina pectoris: Secondary | ICD-10-CM

## 2011-09-19 DIAGNOSIS — Z8249 Family history of ischemic heart disease and other diseases of the circulatory system: Secondary | ICD-10-CM

## 2011-09-19 DIAGNOSIS — F411 Generalized anxiety disorder: Secondary | ICD-10-CM | POA: Diagnosis present

## 2011-09-19 DIAGNOSIS — N281 Cyst of kidney, acquired: Secondary | ICD-10-CM | POA: Diagnosis not present

## 2011-09-19 DIAGNOSIS — E785 Hyperlipidemia, unspecified: Secondary | ICD-10-CM | POA: Diagnosis present

## 2011-09-19 DIAGNOSIS — E7800 Pure hypercholesterolemia, unspecified: Secondary | ICD-10-CM | POA: Diagnosis present

## 2011-09-19 LAB — COMPREHENSIVE METABOLIC PANEL
Albumin: 3.9 g/dL (ref 3.5–5.2)
BUN: 19 mg/dL (ref 6–23)
Creatinine, Ser: 1.26 mg/dL (ref 0.50–1.35)
Total Protein: 7 g/dL (ref 6.0–8.3)

## 2011-09-19 LAB — URINALYSIS, ROUTINE W REFLEX MICROSCOPIC
Glucose, UA: NEGATIVE mg/dL
Hgb urine dipstick: NEGATIVE
Ketones, ur: 15 mg/dL — AB
Leukocytes, UA: NEGATIVE
Protein, ur: NEGATIVE mg/dL
pH: 5.5 (ref 5.0–8.0)

## 2011-09-19 LAB — CBC WITH DIFFERENTIAL/PLATELET
Basophils Relative: 0 % (ref 0–1)
Eosinophils Absolute: 0.1 10*3/uL (ref 0.0–0.7)
HCT: 40.9 % (ref 39.0–52.0)
Hemoglobin: 14.1 g/dL (ref 13.0–17.0)
MCH: 28.5 pg (ref 26.0–34.0)
MCHC: 34.5 g/dL (ref 30.0–36.0)
Monocytes Absolute: 0.5 10*3/uL (ref 0.1–1.0)
Monocytes Relative: 6 % (ref 3–12)

## 2011-09-19 LAB — LIPASE, BLOOD: Lipase: 46 U/L (ref 11–59)

## 2011-09-19 NOTE — ED Notes (Signed)
Pt seen at urgent care and told to come to ED to be evaluated. Pt. Also c/o of some nausea and hot flashes, sob when pain sets in. States pain is intermittent.

## 2011-09-19 NOTE — ED Provider Notes (Signed)
History     CSN: [id number]  Arrival date & time 09/19/11  1478   First MD Initiated Contact with Patient 09/19/11 2219      Chief Complaint  Patient presents with  . Abdominal Pain    (Consider location/radiation/quality/duration/timing/severity/associated sxs/prior treatment) HPI Comments: Patient presents today with a chief complaint of abdominal pain.  Pain is generalized.  He reports that he has had similar pain for the past 2-3 months, but the pain is much worse today.  When asked to describe the pain he states, "It feels like pain that you have when you have to have a bowel movement."  His last BM was this morning and was normal.  He denies diarrhea.  Denies blood in his stool.  He has been nauseous, but no vomiting.  PMH significant for Acute Pancreatitis and Prostate Cancer.  He has had a Cholecystectomy done approximately 10 years ago.  No other prior abdominal surgeries.  He also has a history of CAD.  Patient is a 71 y.o. male presenting with abdominal pain. The history is provided by the patient.  Abdominal Pain The primary symptoms of the illness include abdominal pain, nausea and dysuria. The primary symptoms of the illness do not include fever, vomiting or diarrhea.  The dysuria is associated with urgency. The dysuria is not associated with hematuria or penile pain.  Additional symptoms associated with the illness include urgency. Symptoms associated with the illness do not include chills, constipation or hematuria.    Past Medical History  Diagnosis Date  . Hypertension   . Prostate cancer   . Pancreatitis Dec 2011    ERCP ok.  . Hiatal hernia   . Coronary artery disease     s/p CABG in 2001, s/p DES to left main in the past as well as stenting of the LCX and RCA. Negative stress test in December 2011 but continued to have pain. S/P repeat cath showing patent radial graft to the DX with total occlusion of the distal limb to the LCX and a patent LIMA to LAD. Stentsin  the left main, LCX & RCA were patent but did have DES to the LCX for high grade bifurcational AV/OM   . Chronic chest pain   . Left bundle branch block   . Carotid disease, bilateral   . GERD (gastroesophageal reflux disease)   . Hyperlipidemia   . Inguinal hernia   . Statin intolerance   . Anxiety disorder   . Peripheral vascular disease 04/10/11    Past Surgical History  Procedure Date  . Heart stents   . Cholecystectomy   . Inguinal hernia repair     bilateral  . Coronary artery bypass graft 2001  . Endarterectomy 04/14/2011    Procedure: ENDARTERECTOMY CAROTID;  Surgeon: Pryor Ochoa, MD;  Location: [facility] OR;  Service: Vascular;  Laterality: Left;  Would like to perform procedure first, at 0730    Family History  Problem Relation Age of Onset  . Heart disease Mother     Heart Disease before age 63  . Hypertension Mother   . Cancer Father   . Hypertension Sister   . Heart disease Sister     Heart Disease before age 96  . Hypertension Brother   . Heart disease Brother     Heart Disease before age 61  . Heart attack Brother     History  Substance Use Topics  . Smoking status: Never Smoker   . Smokeless tobacco: Current User  Types: Chew  . Alcohol Use: No      Review of Systems  Constitutional: Negative for fever and chills.  Cardiovascular: Negative for chest pain.  Gastrointestinal: Positive for nausea and abdominal pain. Negative for vomiting, diarrhea, constipation, blood in stool and abdominal distention.  Genitourinary: Positive for dysuria and urgency. Negative for hematuria, discharge, penile swelling, scrotal swelling, penile pain and testicular pain.    Allergies  Amoxicillin-pot clavulanate; Codeine; Hydrocodone; Morphine; Nitrofuran derivatives; Other; Oxycodone hcl; Shellfish allergy; Tramadol; and Zolpidem tartrate  Home Medications   Current Outpatient Rx  Name Route Sig Dispense Refill  . AMLODIPINE BESYLATE 5 MG PO TABS Oral Take 2.5 mg  by mouth 2 (two) times daily.    . ASPIRIN EC 81 MG PO TBEC Oral Take 81 mg by mouth daily.    Marland Kitchen CLOPIDOGREL BISULFATE 75 MG PO TABS Oral Take 1 tablet (75 mg total) by mouth daily. 30 tablet 11  . DIPHENHYDRAMINE HCL 25 MG PO TABS Oral Take 25 mg by mouth at bedtime. sleep    . ENALAPRIL MALEATE 20 MG PO TABS Oral Take 20 mg by mouth 2 (two) times daily.    . ISOSORBIDE MONONITRATE ER 60 MG PO TB24 Oral Take 30 mg by mouth 2 (two) times daily.    Marland Kitchen METOCLOPRAMIDE HCL 10 MG PO TABS Oral Take 5 mg by mouth 4 (four) times daily as needed. Stomach pain    . METOPROLOL TARTRATE 50 MG PO TABS Oral Take 75 mg by mouth 2 (two) times daily.    Marland Kitchen NITROGLYCERIN 0.4 MG SL SUBL Sublingual Place 0.4 mg under the tongue every 5 (five) minutes as needed. For chest pain    . OMEGA-3-ACID ETHYL ESTERS 1 G PO CAPS Oral Take 1 g by mouth 2 (two) times daily.     Marland Kitchen PANTOPRAZOLE SODIUM 40 MG PO TBEC Oral Take 40 mg by mouth 2 (two) times daily.     Marland Kitchen TAMSULOSIN HCL 0.4 MG PO CAPS Oral Take 1 capsule (0.4 mg total) by mouth daily after supper. 30 capsule 6    BP 142/67  Pulse 114  Temp 97.4 F (36.3 C) (Oral)  Resp 15  SpO2 100%  Physical Exam  Nursing note and vitals reviewed. Constitutional: He appears well-developed and well-nourished. No distress.  HENT:  Head: Normocephalic and atraumatic.  Mouth/Throat: Oropharynx is clear and moist.  Neck: Normal range of motion. Neck supple.  Cardiovascular: Normal rate, regular rhythm and normal heart sounds.   Pulmonary/Chest: Effort normal and breath sounds normal.  Abdominal: Normal appearance and bowel sounds are normal. He exhibits no mass. There is generalized tenderness. There is no rebound, no guarding and no CVA tenderness.  Musculoskeletal: Normal range of motion.  Neurological: He is alert.  Skin: Skin is warm and dry. He is not diaphoretic.  Psychiatric: He has a normal mood and affect.    ED Course  Procedures (including critical care  time)  Labs Reviewed  COMPREHENSIVE METABOLIC PANEL - Abnormal; Notable for the following:    Glucose, Bld 109 (*)     GFR calc non Af Amer 56 (*)     GFR calc Af Amer 65 (*)     All other components within normal limits  CBC WITH DIFFERENTIAL  LIPASE, BLOOD  URINALYSIS, ROUTINE W REFLEX MICROSCOPIC   Ct Abdomen Pelvis W Contrast  09/20/2011  *RADIOLOGY REPORT*  Clinical Data: Abdominal pain  CT ABDOMEN AND PELVIS WITH CONTRAST  Technique:  Multidetector CT imaging of  the abdomen and pelvis was performed following the standard protocol during bolus administration of intravenous contrast.  Contrast: OMNIPAQUE IOHEXOL 300 MG/ML  SOLN  Comparison: 03/04/2011  Findings: Heart size mildly enlarged.  Coronary artery calcification.  Multiple hepatic hypodensities are nonspecific however unchanged. There is a small amount of perihepatic fluid.  Unremarkable spleen. Unremarkable pancreas and adrenal glands.  Absent gallbladder.  No biliary ductal dilatation.  Tiny nonobstructing stone within the left kidney.  There are multiple renal cysts, the majority of which are simple.  Some are incompletely characterized.  No hydronephrosis or hydroureter.  Colonic diverticulosis.  No CT evidence for diverticulitis or colitis.  Long segment of distal small bowel with wall thickening and mucosal hyperenhancement.  Interloop fluid.  Small amount of left paracolic gutter and perisplenic fluid.  No free intraperitoneal air.  No pneumatosis.  There is scattered atherosclerotic calcification of the aorta and its branches. No aneurysmal dilatation.  Decompressed bladder limits evaluation.  Multilevel degenerative changes of the imaged spine. No acute or aggressive appearing osseous lesion.  Status post median sternotomy.  IMPRESSION: Long segment of thickened small bowel with associate mucosal hyperenhancement.  The pattern is in keeping with a nonspecific enteritis; infectious, ischemic, and inflammatory considerations.   Circumferential bladder wall thickening is nonspecific given incomplete distension.  Correlate with urinalysis.  Small amount ascites is presumably reactive.  Multiple renal cysts and incompletely characterize hypodensities. Consider non emergent ultrasound follow-up.   Original Report Authenticated By: Waneta Martins, M.D.      No diagnosis found.  Patient discussed with Dr. Ignacia Palma who has also evaluated the patient.   Patient discussed with Dr. Toniann Fail who has agreed to admit the patient.    MDM  Patient presenting today with generalized abdominal pain.  Pain worse today than pain that he has had in the past. Patient afebrile.  On exam, generalized abdominal pain.  Labs unremarkable.  CT ab/pelvis results as above.  Lactic acid ordered and is pending.  Patient started on IV Cipro and IV Flagyl.  Patient admitted to the hospital for further management.          Pascal [place], PA-C 09/20/11 1136

## 2011-09-19 NOTE — ED Notes (Signed)
Pt  Unable to void at present/

## 2011-09-19 NOTE — Progress Notes (Signed)
71 yo man with midabdominal pain, which has been present for 2 or 3 months, but was worse today.  He has had prior cholecystectomy, and has had a GI procedure at the same time, possibly ERCP.  He thinks he has had pancreatitis in the past.  Exam shows him to be a pleasant elderly man in no distress at present, who localizes pain to the periumbilical region.  There is no mass or tenderness.  I advised him that his lab tests were good, and that further evaluation would involve having abdominal/pelvic CT with oral and IV contrast.

## 2011-09-19 NOTE — ED Notes (Signed)
The pt has had abd  Pain mid-upper abd pain for 2-3 months with nausea.  He had his gb out 10 years ago and he has had pancreatitis in the past

## 2011-09-20 ENCOUNTER — Emergency Department (HOSPITAL_COMMUNITY): Payer: Medicare Other

## 2011-09-20 ENCOUNTER — Encounter (HOSPITAL_COMMUNITY): Payer: Self-pay | Admitting: Radiology

## 2011-09-20 DIAGNOSIS — I25810 Atherosclerosis of coronary artery bypass graft(s) without angina pectoris: Secondary | ICD-10-CM

## 2011-09-20 DIAGNOSIS — Z7902 Long term (current) use of antithrombotics/antiplatelets: Secondary | ICD-10-CM | POA: Diagnosis not present

## 2011-09-20 DIAGNOSIS — Z7982 Long term (current) use of aspirin: Secondary | ICD-10-CM | POA: Diagnosis not present

## 2011-09-20 DIAGNOSIS — Z91013 Allergy to seafood: Secondary | ICD-10-CM | POA: Diagnosis not present

## 2011-09-20 DIAGNOSIS — R109 Unspecified abdominal pain: Secondary | ICD-10-CM | POA: Diagnosis present

## 2011-09-20 DIAGNOSIS — Z8249 Family history of ischemic heart disease and other diseases of the circulatory system: Secondary | ICD-10-CM | POA: Diagnosis not present

## 2011-09-20 DIAGNOSIS — E7800 Pure hypercholesterolemia, unspecified: Secondary | ICD-10-CM

## 2011-09-20 DIAGNOSIS — N281 Cyst of kidney, acquired: Secondary | ICD-10-CM | POA: Diagnosis not present

## 2011-09-20 DIAGNOSIS — Z951 Presence of aortocoronary bypass graft: Secondary | ICD-10-CM | POA: Diagnosis not present

## 2011-09-20 DIAGNOSIS — I6529 Occlusion and stenosis of unspecified carotid artery: Secondary | ICD-10-CM | POA: Diagnosis present

## 2011-09-20 DIAGNOSIS — Z888 Allergy status to other drugs, medicaments and biological substances status: Secondary | ICD-10-CM | POA: Diagnosis not present

## 2011-09-20 DIAGNOSIS — K5289 Other specified noninfective gastroenteritis and colitis: Secondary | ICD-10-CM

## 2011-09-20 DIAGNOSIS — I2510 Atherosclerotic heart disease of native coronary artery without angina pectoris: Secondary | ICD-10-CM | POA: Diagnosis present

## 2011-09-20 DIAGNOSIS — E785 Hyperlipidemia, unspecified: Secondary | ICD-10-CM | POA: Diagnosis present

## 2011-09-20 DIAGNOSIS — I10 Essential (primary) hypertension: Secondary | ICD-10-CM

## 2011-09-20 DIAGNOSIS — I739 Peripheral vascular disease, unspecified: Secondary | ICD-10-CM | POA: Diagnosis present

## 2011-09-20 DIAGNOSIS — Z79899 Other long term (current) drug therapy: Secondary | ICD-10-CM | POA: Diagnosis not present

## 2011-09-20 DIAGNOSIS — F411 Generalized anxiety disorder: Secondary | ICD-10-CM | POA: Diagnosis present

## 2011-09-20 DIAGNOSIS — Z9861 Coronary angioplasty status: Secondary | ICD-10-CM | POA: Diagnosis not present

## 2011-09-20 DIAGNOSIS — Z881 Allergy status to other antibiotic agents status: Secondary | ICD-10-CM | POA: Diagnosis not present

## 2011-09-20 DIAGNOSIS — F17200 Nicotine dependence, unspecified, uncomplicated: Secondary | ICD-10-CM | POA: Diagnosis present

## 2011-09-20 DIAGNOSIS — K529 Noninfective gastroenteritis and colitis, unspecified: Secondary | ICD-10-CM | POA: Diagnosis present

## 2011-09-20 LAB — COMPREHENSIVE METABOLIC PANEL
AST: 13 U/L (ref 0–37)
BUN: 19 mg/dL (ref 6–23)
CO2: 25 mEq/L (ref 19–32)
Calcium: 8.6 mg/dL (ref 8.4–10.5)
Creatinine, Ser: 1.13 mg/dL (ref 0.50–1.35)
GFR calc Af Amer: 74 mL/min — ABNORMAL LOW (ref >90)
GFR calc non Af Amer: 64 mL/min — ABNORMAL LOW (ref >90)
Glucose, Bld: 111 mg/dL — ABNORMAL HIGH (ref 70–99)

## 2011-09-20 LAB — CBC WITH DIFFERENTIAL/PLATELET
Hemoglobin: 12.2 g/dL — ABNORMAL LOW (ref 13.0–17.0)
Lymphocytes Relative: 11 % — ABNORMAL LOW (ref 12–46)
Lymphs Abs: 0.8 10*3/uL (ref 0.7–4.0)
Monocytes Relative: 6 % (ref 3–12)
Neutro Abs: 6.1 10*3/uL (ref 1.7–7.7)
Neutrophils Relative %: 82 % — ABNORMAL HIGH (ref 43–77)
RBC: 4.27 MIL/uL (ref 4.22–5.81)
WBC: 7.4 10*3/uL (ref 4.0–10.5)

## 2011-09-20 LAB — CLOSTRIDIUM DIFFICILE BY PCR: Toxigenic C. Difficile by PCR: NEGATIVE

## 2011-09-20 LAB — LACTIC ACID, PLASMA: Lactic Acid, Venous: 0.9 mmol/L (ref 0.5–2.2)

## 2011-09-20 MED ORDER — SODIUM CHLORIDE 0.9 % IV SOLN
INTRAVENOUS | Status: DC
  Administered 2011-09-20: 03:00:00 via INTRAVENOUS

## 2011-09-20 MED ORDER — METRONIDAZOLE IN NACL 5-0.79 MG/ML-% IV SOLN: 500.0000 mg | Freq: Once | INTRAVENOUS | Status: DC

## 2011-09-20 MED ORDER — NITROGLYCERIN 0.4 MG SL SUBL: 0.4000 mg | SUBLINGUAL_TABLET | SUBLINGUAL | Status: DC | PRN

## 2011-09-20 MED ORDER — AMLODIPINE BESYLATE 2.5 MG PO TABS
2.5000 mg | ORAL_TABLET | Freq: Two times a day (BID) | ORAL | Status: DC
  Administered 2011-09-20 – 2011-09-21 (×3): 2.5 mg via ORAL
  Filled 2011-09-20 (×4): qty 1

## 2011-09-20 MED ORDER — ONDANSETRON HCL 4 MG/2ML IJ SOLN
4.0000 mg | Freq: Once | INTRAMUSCULAR | Status: DC
  Filled 2011-09-20: qty 2

## 2011-09-20 MED ORDER — TAMSULOSIN HCL 0.4 MG PO CAPS
0.4000 mg | ORAL_CAPSULE | Freq: Every day | ORAL | Status: DC
  Administered 2011-09-20: 0.4 mg via ORAL
  Filled 2011-09-20 (×2): qty 1

## 2011-09-20 MED ORDER — PANTOPRAZOLE SODIUM 40 MG PO TBEC
40.0000 mg | DELAYED_RELEASE_TABLET | Freq: Two times a day (BID) | ORAL | Status: DC
  Administered 2011-09-20 – 2011-09-21 (×3): 40 mg via ORAL
  Filled 2011-09-20 (×3): qty 1

## 2011-09-20 MED ORDER — ENOXAPARIN SODIUM 40 MG/0.4ML ~~LOC~~ SOLN
40.0000 mg | SUBCUTANEOUS | Status: DC
  Administered 2011-09-20 – 2011-09-21 (×2): 40 mg via SUBCUTANEOUS
  Filled 2011-09-20 (×3): qty 0.4

## 2011-09-20 MED ORDER — ACETAMINOPHEN 325 MG PO TABS: 650.0000 mg | ORAL_TABLET | Freq: Four times a day (QID) | ORAL | Status: DC | PRN

## 2011-09-20 MED ORDER — SODIUM CHLORIDE 0.9 % IJ SOLN
3.0000 mL | Freq: Two times a day (BID) | INTRAMUSCULAR | Status: DC
  Administered 2011-09-20: 3 mL via INTRAVENOUS

## 2011-09-20 MED ORDER — DIPHENHYDRAMINE HCL 25 MG PO TABS
25.0000 mg | ORAL_TABLET | Freq: Every day | ORAL | Status: DC
Start: 2011-09-20 — End: 2011-09-21
  Administered 2011-09-20: 25 mg via ORAL
  Filled 2011-09-20 (×2): qty 1

## 2011-09-20 MED ORDER — SODIUM CHLORIDE 0.9 % IV BOLUS (SEPSIS): 500.0000 mL | Freq: Once | INTRAVENOUS | Status: DC

## 2011-09-20 MED ORDER — ONDANSETRON HCL 4 MG/2ML IJ SOLN
4.0000 mg | Freq: Four times a day (QID) | INTRAMUSCULAR | Status: DC | PRN
  Administered 2011-09-20 (×2): 4 mg via INTRAVENOUS
  Filled 2011-09-20 (×3): qty 2

## 2011-09-20 MED ORDER — ISOSORBIDE MONONITRATE ER 30 MG PO TB24
30.0000 mg | ORAL_TABLET | Freq: Two times a day (BID) | ORAL | Status: DC
  Administered 2011-09-20 – 2011-09-21 (×3): 30 mg via ORAL
  Filled 2011-09-20 (×4): qty 1

## 2011-09-20 MED ORDER — CIPROFLOXACIN IN D5W 400 MG/200ML IV SOLN
400.0000 mg | Freq: Two times a day (BID) | INTRAVENOUS | Status: DC
  Administered 2011-09-20 (×2): 400 mg via INTRAVENOUS
  Filled 2011-09-20 (×6): qty 200

## 2011-09-20 MED ORDER — SODIUM CHLORIDE 0.9 % IV SOLN
INTRAVENOUS | Status: DC
  Administered 2011-09-20 – 2011-09-21 (×3): via INTRAVENOUS

## 2011-09-20 MED ORDER — ACETAMINOPHEN 650 MG RE SUPP: 650.0000 mg | Freq: Four times a day (QID) | RECTAL | Status: DC | PRN

## 2011-09-20 MED ORDER — METRONIDAZOLE IN NACL 5-0.79 MG/ML-% IV SOLN
500.0000 mg | Freq: Three times a day (TID) | INTRAVENOUS | Status: DC
  Administered 2011-09-20 – 2011-09-21 (×4): 500 mg via INTRAVENOUS
  Filled 2011-09-20 (×8): qty 100

## 2011-09-20 MED ORDER — ONDANSETRON HCL 4 MG PO TABS: 4.0000 mg | ORAL_TABLET | Freq: Four times a day (QID) | ORAL | Status: DC | PRN

## 2011-09-20 MED ORDER — IOHEXOL 300 MG/ML  SOLN
100.0000 mL | Freq: Once | INTRAMUSCULAR | Status: AC | PRN
  Administered 2011-09-20: 100 mL via INTRAVENOUS

## 2011-09-20 MED ORDER — CIPROFLOXACIN IN D5W 400 MG/200ML IV SOLN
400.0000 mg | Freq: Once | INTRAVENOUS | Status: AC
  Administered 2011-09-20: 400 mg via INTRAVENOUS
  Filled 2011-09-20: qty 200

## 2011-09-20 MED ORDER — ENALAPRIL MALEATE 20 MG PO TABS
20.0000 mg | ORAL_TABLET | Freq: Two times a day (BID) | ORAL | Status: DC
  Administered 2011-09-20 – 2011-09-21 (×3): 20 mg via ORAL
  Filled 2011-09-20 (×4): qty 1

## 2011-09-20 MED ORDER — CLOPIDOGREL BISULFATE 75 MG PO TABS
75.0000 mg | ORAL_TABLET | Freq: Every day | ORAL | Status: DC
  Administered 2011-09-20 – 2011-09-21 (×2): 75 mg via ORAL
  Filled 2011-09-20 (×2): qty 1

## 2011-09-20 MED ORDER — ASPIRIN EC 81 MG PO TBEC
81.0000 mg | DELAYED_RELEASE_TABLET | Freq: Every day | ORAL | Status: DC
  Administered 2011-09-20 – 2011-09-21 (×2): 81 mg via ORAL
  Filled 2011-09-20 (×2): qty 1

## 2011-09-20 MED ORDER — METOPROLOL TARTRATE 50 MG PO TABS
75.0000 mg | ORAL_TABLET | Freq: Two times a day (BID) | ORAL | Status: DC
  Administered 2011-09-20 – 2011-09-21 (×3): 75 mg via ORAL
  Filled 2011-09-20 (×4): qty 1

## 2011-09-20 MED ORDER — OMEGA-3-ACID ETHYL ESTERS 1 G PO CAPS
1.0000 g | ORAL_CAPSULE | Freq: Two times a day (BID) | ORAL | Status: DC
  Administered 2011-09-20 – 2011-09-21 (×3): 1 g via ORAL
  Filled 2011-09-20 (×4): qty 1

## 2011-09-20 NOTE — ED Notes (Signed)
Pt returned from CT °

## 2011-09-20 NOTE — ED Notes (Signed)
Pt to CT

## 2011-09-20 NOTE — Progress Notes (Signed)
Patient admitted earlier this a.m. with abdominal pain and diarrhea that has been ongoing for about 2 months with acute worsening the day of admission. CT scan shows some enteritis. He was started on IV Cipro and Flagyl and already has had significant relief although he is still complaining of loose stools. He did experience some nausea today. He is requesting that we increase his diet and try some solid foods. If tolerate solid food will transition his antibiotics over to the by mouth route and consider discharge home in one to 2 days.  Peggye Pitt, MD [facility] Pager: [phone]

## 2011-09-20 NOTE — ED Provider Notes (Signed)
Medical screening examination/treatment/procedure(s) were conducted as a shared visit with non-physician practitioner(s) and myself.  I personally evaluated the patient during the encounter 71 yo man with midabdominal pain, which has been present for 2 or 3 months, but was worse today. He has had prior cholecystectomy, and has had a GI procedure at the same time, possibly ERCP. He thinks he has had pancreatitis in the past. Exam shows him to be a pleasant elderly man in no distress at present, who localizes pain to the periumbilical region. There is no mass or tenderness. I advised him that his lab tests were good, and that further evaluation would involve having abdominal/pelvic CT with oral and IV contrast.        Carleene Cooper III, MD 09/20/11 1235

## 2011-09-20 NOTE — Progress Notes (Signed)
ANTIBIOTIC CONSULT NOTE - INITIAL  Pharmacy Consult for cipro Indication: enteritis  Allergies  Allergen Reactions  . Amoxicillin-Pot Clavulanate     REACTION: 'Burns' Stomach  . Codeine   . Hydrocodone   . Morphine   . Nitrofuran Derivatives   . Other     Mycins  . Oxycodone Hcl   . Shellfish Allergy   . Tramadol   . Zolpidem Tartrate     Patient Measurements:     Vital Signs: Temp: 97.4 F (36.3 C) (08/27 2255) Temp src: Oral (08/27 2156) BP: 142/67 mmHg (08/27 2255) Pulse Rate: 114  (08/27 2002) Intake/Output from previous day:   Intake/Output from this shift:    Labs:  [facility] 09/19/11 1828  WBC 9.0  HGB 14.1  PLT 199  LABCREA --  CREATININE 1.26   The CrCl is unknown because both a height and weight (above a minimum accepted value) are required for this calculation. No results found for this basename: VANCOTROUGH:2,VANCOPEAK:2,VANCORANDOM:2,GENTTROUGH:2,GENTPEAK:2,GENTRANDOM:2,TOBRATROUGH:2,TOBRAPEAK:2,TOBRARND:2,AMIKACINPEAK:2,AMIKACINTROU:2,AMIKACIN:2, in the last 72 hours   Microbiology: No results found for this or any previous visit (from the past 720 hour(s)).  Medical History: Past Medical History  Diagnosis Date  . Hypertension   . Prostate cancer   . Pancreatitis Dec 2011    ERCP ok.  . Hiatal hernia   . Coronary artery disease     s/p CABG in 2001, s/p DES to left main in the past as well as stenting of the LCX and RCA. Negative stress test in December 2011 but continued to have pain. S/P repeat cath showing patent radial graft to the DX with total occlusion of the distal limb to the LCX and a patent LIMA to LAD. Stentsin the left main, LCX & RCA were patent but did have DES to the LCX for high grade bifurcational AV/OM   . Chronic chest pain   . Left bundle branch block   . Carotid disease, bilateral   . GERD (gastroesophageal reflux disease)   . Hyperlipidemia   . Inguinal hernia   . Statin intolerance   . Anxiety disorder   .  Peripheral vascular disease 04/10/11    Medications:  Prescriptions prior to admission  Medication Sig Dispense Refill  . amLODipine (NORVASC) 5 MG tablet Take 2.5 mg by mouth 2 (two) times daily.      Marland Kitchen aspirin EC 81 MG tablet Take 81 mg by mouth daily.      . clopidogrel (PLAVIX) 75 MG tablet Take 1 tablet (75 mg total) by mouth daily.  30 tablet  11  . diphenhydrAMINE (BENADRYL) 25 MG tablet Take 25 mg by mouth at bedtime. sleep      . enalapril (VASOTEC) 20 MG tablet Take 20 mg by mouth 2 (two) times daily.      . isosorbide mononitrate (IMDUR) 60 MG 24 hr tablet Take 30 mg by mouth 2 (two) times daily.      . metoCLOPramide (REGLAN) 10 MG tablet Take 5 mg by mouth 4 (four) times daily as needed. Stomach pain      . metoprolol (LOPRESSOR) 50 MG tablet Take 75 mg by mouth 2 (two) times daily.      . nitroGLYCERIN (NITROSTAT) 0.4 MG SL tablet Place 0.4 mg under the tongue every 5 (five) minutes as needed. For chest pain      . omega-3 acid ethyl esters (LOVAZA) 1 G capsule Take 1 g by mouth 2 (two) times daily.       . pantoprazole (PROTONIX) 40 MG tablet  Take 40 mg by mouth 2 (two) times daily.       . Tamsulosin HCl (FLOMAX) 0.4 MG CAPS Take 1 capsule (0.4 mg total) by mouth daily after supper.  30 capsule  6   Assessment: 71 yo man admitted with abdominal pain.  CT shows inflammation of small bowel segment.  Goal of Therapy:  Eradication of infection  Plan:  Cipro 400 mg IV q12 hours. F/u cultures, clinical condition and renal function.  Luci Bellucci Poteet 09/20/2011,4:17 AM

## 2011-09-20 NOTE — ED Notes (Signed)
NPO except for medications per admitting.

## 2011-09-20 NOTE — H&P (Signed)
[name] [name] is an 71 y.o. male.   Patient was seen and examined on September 20, 2011 at 2:30 AM. PCP - Dr. Nathanial Rancher in [place]. Cardiologist - Dr. Jens Som. Chief Complaint: Abdominal pain. HPI: 71 year-old male with history of CAD status post CABG and stents, hypertension, carotid endarterectomy, pancreatitis, hyperlipidemia and peripheral vascular disease has been experiencing abdominal pain for last 2-3 months. But since yesterday it suddenly worsened. Patient had nausea but not vomited. Denies any diarrhea before coming to the ER. The pain is diffuse. In the ER patient had CT abdomen pelvis which shows inflammation of the small bowel segment. While in the ER patient had an episode of nausea and vomiting and also had diarrhea twice. Patient denies any fever chills. Denies any chest pain or shortness of breath. Denies having been recently admitted to the hospital or have using antibiotics.  Past Medical History  Diagnosis Date  . Hypertension   . Prostate cancer   . Pancreatitis Dec 2011    ERCP ok.  . Hiatal hernia   . Coronary artery disease     s/p CABG in 2001, s/p DES to left main in the past as well as stenting of the LCX and RCA. Negative stress test in December 2011 but continued to have pain. S/P repeat cath showing patent radial graft to the DX with total occlusion of the distal limb to the LCX and a patent LIMA to LAD. Stentsin the left main, LCX & RCA were patent but did have DES to the LCX for high grade bifurcational AV/OM   . Chronic chest pain   . Left bundle branch block   . Carotid disease, bilateral   . GERD (gastroesophageal reflux disease)   . Hyperlipidemia   . Inguinal hernia   . Statin intolerance   . Anxiety disorder   . Peripheral vascular disease 04/10/11    Past Surgical History  Procedure Date  . Heart stents   . Cholecystectomy   . Inguinal hernia repair     bilateral  . Coronary artery bypass graft 2001  . Endarterectomy 04/14/2011    Procedure:  ENDARTERECTOMY CAROTID;  Surgeon: Pryor Ochoa, MD;  Location: [facility] OR;  Service: Vascular;  Laterality: Left;  Would like to perform procedure first, at 0730    Family History  Problem Relation Age of Onset  . Heart disease Mother     Heart Disease before age 62  . Hypertension Mother   . Cancer Father   . Hypertension Sister   . Heart disease Sister     Heart Disease before age 64  . Hypertension Brother   . Heart disease Brother     Heart Disease before age 32  . Heart attack Brother    Social History:  reports that he has never smoked. His smokeless tobacco use includes Chew. He reports that he does not drink alcohol or use illicit drugs.  Allergies:  Allergies  Allergen Reactions  . Amoxicillin-Pot Clavulanate     REACTION: 'Burns' Stomach  . Codeine   . Hydrocodone   . Morphine   . Nitrofuran Derivatives   . Other     Mycins  . Oxycodone Hcl   . Shellfish Allergy   . Tramadol   . Zolpidem Tartrate      (Not in a hospital admission)  Results for orders placed during the hospital encounter of 09/19/11 (from the past 48 hour(s))  CBC WITH DIFFERENTIAL     Status: Normal   Collection Time  09/19/11  6:28 PM      Component Value Range Comment   WBC 9.0  4.0 - 10.5 K/uL    RBC 4.94  4.22 - 5.81 MIL/uL    Hemoglobin 14.1  13.0 - 17.0 g/dL    HCT 16.1  09.6 - 04.5 %    MCV 82.8  78.0 - 100.0 fL    MCH 28.5  26.0 - 34.0 pg    MCHC 34.5  30.0 - 36.0 g/dL    RDW 40.9  81.1 - 91.4 %    Platelets 199  150 - 400 K/uL    Neutrophils Relative 74  43 - 77 %    Neutro Abs 6.7  1.7 - 7.7 K/uL    Lymphocytes Relative 19  12 - 46 %    Lymphs Abs 1.7  0.7 - 4.0 K/uL    Monocytes Relative 6  3 - 12 %    Monocytes Absolute 0.5  0.1 - 1.0 K/uL    Eosinophils Relative 1  0 - 5 %    Eosinophils Absolute 0.1  0.0 - 0.7 K/uL    Basophils Relative 0  0 - 1 %    Basophils Absolute 0.0  0.0 - 0.1 K/uL   COMPREHENSIVE METABOLIC PANEL     Status: Abnormal   Collection Time    09/19/11  6:28 PM      Component Value Range Comment   Sodium 139  135 - 145 mEq/L    Potassium 4.1  3.5 - 5.1 mEq/L    Chloride 103  96 - 112 mEq/L    CO2 25  19 - 32 mEq/L    Glucose, Bld 109 (*) 70 - 99 mg/dL    BUN 19  6 - 23 mg/dL    Creatinine, Ser 7.82  0.50 - 1.35 mg/dL    Calcium 9.4  8.4 - 95.6 mg/dL    Total Protein 7.0  6.0 - 8.3 g/dL    Albumin 3.9  3.5 - 5.2 g/dL    AST 18  0 - 37 U/L    ALT 11  0 - 53 U/L    Alkaline Phosphatase 58  39 - 117 U/L    Total Bilirubin 1.2  0.3 - 1.2 mg/dL    GFR calc non Af Amer 56 (*) >90 mL/min    GFR calc Af Amer 65 (*) >90 mL/min   LIPASE, BLOOD     Status: Normal   Collection Time   09/19/11  6:28 PM      Component Value Range Comment   Lipase 46  11 - 59 U/L   URINALYSIS, ROUTINE W REFLEX MICROSCOPIC     Status: Abnormal   Collection Time   09/19/11 10:55 PM      Component Value Range Comment   Color, Urine AMBER (*) YELLOW BIOCHEMICALS MAY BE AFFECTED BY COLOR   APPearance CLEAR  CLEAR    Specific Gravity, Urine 1.028  1.005 - 1.030    pH 5.5  5.0 - 8.0    Glucose, UA NEGATIVE  NEGATIVE mg/dL    Hgb urine dipstick NEGATIVE  NEGATIVE    Bilirubin Urine SMALL (*) NEGATIVE    Ketones, ur 15 (*) NEGATIVE mg/dL    Protein, ur NEGATIVE  NEGATIVE mg/dL    Urobilinogen, UA 1.0  0.0 - 1.0 mg/dL    Nitrite NEGATIVE  NEGATIVE    Leukocytes, UA NEGATIVE  NEGATIVE MICROSCOPIC NOT DONE ON URINES WITH NEGATIVE PROTEIN, BLOOD, LEUKOCYTES, NITRITE, OR  GLUCOSE <1000 mg/dL.  LACTIC ACID, PLASMA     Status: Normal   Collection Time   09/20/11  1:57 AM      Component Value Range Comment   Lactic Acid, Venous 0.9  0.5 - 2.2 mmol/L    Ct Abdomen Pelvis W Contrast  09/20/2011  *RADIOLOGY REPORT*  Clinical Data: Abdominal pain  CT ABDOMEN AND PELVIS WITH CONTRAST  Technique:  Multidetector CT imaging of the abdomen and pelvis was performed following the standard protocol during bolus administration of intravenous contrast.  Contrast:  OMNIPAQUE IOHEXOL 300 MG/ML  SOLN  Comparison: 03/04/2011  Findings: Heart size mildly enlarged.  Coronary artery calcification.  Multiple hepatic hypodensities are nonspecific however unchanged. There is a small amount of perihepatic fluid.  Unremarkable spleen. Unremarkable pancreas and adrenal glands.  Absent gallbladder.  No biliary ductal dilatation.  Tiny nonobstructing stone within the left kidney.  There are multiple renal cysts, the majority of which are simple.  Some are incompletely characterized.  No hydronephrosis or hydroureter.  Colonic diverticulosis.  No CT evidence for diverticulitis or colitis.  Long segment of distal small bowel with wall thickening and mucosal hyperenhancement.  Interloop fluid.  Small amount of left paracolic gutter and perisplenic fluid.  No free intraperitoneal air.  No pneumatosis.  There is scattered atherosclerotic calcification of the aorta and its branches. No aneurysmal dilatation.  Decompressed bladder limits evaluation.  Multilevel degenerative changes of the imaged spine. No acute or aggressive appearing osseous lesion.  Status post median sternotomy.  IMPRESSION: Long segment of thickened small bowel with associate mucosal hyperenhancement.  The pattern is in keeping with a nonspecific enteritis; infectious, ischemic, and inflammatory considerations.  Circumferential bladder wall thickening is nonspecific given incomplete distension.  Correlate with urinalysis.  Small amount ascites is presumably reactive.  Multiple renal cysts and incompletely characterize hypodensities. Consider non emergent ultrasound follow-up.   Original Report Authenticated By: Waneta Martins, M.D.     Review of Systems  Constitutional: Negative.   HENT: Negative.   Eyes: Negative.   Respiratory: Negative.   Cardiovascular: Negative.   Gastrointestinal: Positive for nausea and abdominal pain.  Genitourinary: Negative.   Musculoskeletal: Negative.   Skin: Negative.     Neurological: Negative.   Endo/Heme/Allergies: Negative.   Psychiatric/Behavioral: Negative.     Blood pressure 142/67, pulse 114, temperature 97.4 F (36.3 C), temperature source Oral, resp. rate 15, SpO2 100.00%. Physical Exam  Constitutional: He is oriented to person, place, and time. He appears well-developed and well-nourished. No distress.  HENT:  Head: Normocephalic and atraumatic.  Right Ear: External ear normal.  Left Ear: External ear normal.  Nose: Nose normal.  Mouth/Throat: Oropharynx is clear and moist. No oropharyngeal exudate.  Eyes: Conjunctivae are normal. Pupils are equal, round, and reactive to light. Right eye exhibits no discharge. Left eye exhibits no discharge. No scleral icterus.  Neck: Normal range of motion. Neck supple.  Cardiovascular: Normal rate and regular rhythm.   Respiratory: Effort normal and breath sounds normal. No respiratory distress. He has no wheezes. He has no rales.  GI: Soft. He exhibits no distension. There is tenderness (MILD DIFFUSE.). There is no guarding.  Musculoskeletal: Normal range of motion. He exhibits no edema and no tenderness.  Neurological: He is alert and oriented to person, place, and time.       Moves all extremities.  Skin: Skin is warm and dry. He is not diaphoretic.  Psychiatric: His behavior is normal.     Assessment/Plan #1.  Abdominal pain - CT showing inflammation of small bowel segment. At this time is concerning for ischemia versus infectious causes. Patient does have mild diffuse tenderness. Patient will be kept n.p.o. with gentle hydration. Cipro and Flagyl thru IV. Check lactic acid level and repeat labs. Consult GI in a.m. #2. CAD status post CABG and stenting - presently denies any chest pain. #3. Hypertension - continue present medications. #4. Hyperlipidemia - continue present medications. #5. Carotid endarterectomy. #6. History of prostate cancer.  CODE STATUS - full code.  Shamica Moree  N. 09/20/2011, 3:12 AM

## 2011-09-21 DIAGNOSIS — I25810 Atherosclerosis of coronary artery bypass graft(s) without angina pectoris: Secondary | ICD-10-CM | POA: Diagnosis not present

## 2011-09-21 DIAGNOSIS — K529 Noninfective gastroenteritis and colitis, unspecified: Secondary | ICD-10-CM

## 2011-09-21 DIAGNOSIS — I10 Essential (primary) hypertension: Secondary | ICD-10-CM | POA: Diagnosis not present

## 2011-09-21 DIAGNOSIS — R109 Unspecified abdominal pain: Secondary | ICD-10-CM | POA: Diagnosis not present

## 2011-09-21 DIAGNOSIS — K5289 Other specified noninfective gastroenteritis and colitis: Secondary | ICD-10-CM | POA: Diagnosis not present

## 2011-09-21 LAB — CBC
MCHC: 34.3 g/dL (ref 30.0–36.0)
MCV: 83.6 fL (ref 78.0–100.0)
Platelets: 148 10*3/uL — ABNORMAL LOW (ref 150–400)
RDW: 15 % (ref 11.5–15.5)
WBC: 4.4 10*3/uL (ref 4.0–10.5)

## 2011-09-21 LAB — BASIC METABOLIC PANEL
BUN: 12 mg/dL (ref 6–23)
CO2: 24 mEq/L (ref 19–32)
Calcium: 8.4 mg/dL (ref 8.4–10.5)
Creatinine, Ser: 1.23 mg/dL (ref 0.50–1.35)
GFR calc Af Amer: 66 mL/min — ABNORMAL LOW (ref >90)

## 2011-09-21 MED ORDER — METRONIDAZOLE 500 MG PO TABS: 500.0000 mg | ORAL_TABLET | Freq: Three times a day (TID) | ORAL | Status: AC

## 2011-09-21 MED ORDER — HYDROCODONE-ACETAMINOPHEN 5-500 MG PO TABS: 1.0000 | ORAL_TABLET | Freq: Four times a day (QID) | ORAL | Status: AC | PRN

## 2011-09-21 MED ORDER — CIPROFLOXACIN HCL 250 MG PO TABS: 250.0000 mg | ORAL_TABLET | Freq: Two times a day (BID) | ORAL | Status: AC

## 2011-09-21 NOTE — Discharge Summary (Signed)
Physician Discharge Summary  Patient ID: [name] [name] MRN: [id number] DOB/AGE: 29-Jan-1940 71 y.o.  Admit date: 09/19/2011 Discharge date: 09/21/2011  Primary Care Physician:  Ailene Ravel, MD   Discharge Diagnoses:    Principal Problem:  *Enteritis Active Problems:  HYPERCHOLESTEROLEMIA, PURE  HYPERTENSION, BENIGN  CAD, ARTERY BYPASS GRAFT  CAROTID ARTERY STENOSIS, WITHOUT INFARCTION  Abdominal pain    Medication List  As of 09/21/2011  9:10 AM   TAKE these medications         amLODipine 5 MG tablet   Commonly known as: NORVASC   Take 2.5 mg by mouth 2 (two) times daily.      aspirin EC 81 MG tablet   Take 81 mg by mouth daily.      ciprofloxacin 250 MG tablet   Commonly known as: CIPRO   Take 1 tablet (250 mg total) by mouth 2 (two) times daily.      clopidogrel 75 MG tablet   Commonly known as: PLAVIX   Take 1 tablet (75 mg total) by mouth daily.      diphenhydrAMINE 25 MG tablet   Commonly known as: BENADRYL   Take 25 mg by mouth at bedtime. sleep      enalapril 20 MG tablet   Commonly known as: VASOTEC   Take 20 mg by mouth 2 (two) times daily.      HYDROcodone-acetaminophen 5-500 MG per tablet   Commonly known as: VICODIN   Take 1 tablet by mouth every 6 (six) hours as needed for pain.      isosorbide mononitrate 60 MG 24 hr tablet   Commonly known as: IMDUR   Take 30 mg by mouth 2 (two) times daily.      metoCLOPramide 10 MG tablet   Commonly known as: REGLAN   Take 5 mg by mouth 4 (four) times daily as needed. Stomach pain      metoprolol 50 MG tablet   Commonly known as: LOPRESSOR   Take 75 mg by mouth 2 (two) times daily.      metroNIDAZOLE 500 MG tablet   Commonly known as: FLAGYL   Take 1 tablet (500 mg total) by mouth 3 (three) times daily.      nitroGLYCERIN 0.4 MG SL tablet   Commonly known as: NITROSTAT   Place 0.4 mg under the tongue every 5 (five) minutes as needed. For chest pain      omega-3 acid ethyl esters 1 G capsule   Commonly known as: LOVAZA   Take 1 g by mouth 2 (two) times daily.      pantoprazole 40 MG tablet   Commonly known as: PROTONIX   Take 40 mg by mouth 2 (two) times daily.      Tamsulosin HCl 0.4 MG Caps   Commonly known as: FLOMAX   Take 1 capsule (0.4 mg total) by mouth daily after supper.             Disposition and Follow-up:  Will be discharged home today in stable and improved condition. Instructed to followup with his PCP in 2 weeks.  Consults:  None    Significant Diagnostic Studies:  Ct Abdomen Pelvis W Contrast  09/20/2011  *RADIOLOGY REPORT*  Clinical Data: Abdominal pain  CT ABDOMEN AND PELVIS WITH CONTRAST  Technique:  Multidetector CT imaging of the abdomen and pelvis was performed following the standard protocol during bolus administration of intravenous contrast.  Contrast: OMNIPAQUE IOHEXOL 300 MG/ML  SOLN  Comparison: 03/04/2011  Findings:  Heart size mildly enlarged.  Coronary artery calcification.  Multiple hepatic hypodensities are nonspecific however unchanged. There is a small amount of perihepatic fluid.  Unremarkable spleen. Unremarkable pancreas and adrenal glands.  Absent gallbladder.  No biliary ductal dilatation.  Tiny nonobstructing stone within the left kidney.  There are multiple renal cysts, the majority of which are simple.  Some are incompletely characterized.  No hydronephrosis or hydroureter.  Colonic diverticulosis.  No CT evidence for diverticulitis or colitis.  Long segment of distal small bowel with wall thickening and mucosal hyperenhancement.  Interloop fluid.  Small amount of left paracolic gutter and perisplenic fluid.  No free intraperitoneal air.  No pneumatosis.  There is scattered atherosclerotic calcification of the aorta and its branches. No aneurysmal dilatation.  Decompressed bladder limits evaluation.  Multilevel degenerative changes of the imaged spine. No acute or aggressive appearing osseous lesion.  Status post median sternotomy.   IMPRESSION: Long segment of thickened small bowel with associate mucosal hyperenhancement.  The pattern is in keeping with a nonspecific enteritis; infectious, ischemic, and inflammatory considerations.  Circumferential bladder wall thickening is nonspecific given incomplete distension.  Correlate with urinalysis.  Small amount ascites is presumably reactive.  Multiple renal cysts and incompletely characterize hypodensities. Consider non emergent ultrasound follow-up.   Original Report Authenticated By: Waneta Martins, M.D.     Brief H and P: For complete details please refer to admission H and P, but in brief patient is a 71 year-old male with history of CAD status post CABG and stents, hypertension, carotid endarterectomy, pancreatitis, hyperlipidemia and peripheral vascular disease has been experiencing abdominal pain for last 2-3 months. But since yesterday it suddenly worsened. Patient had nausea but not vomited. Denies any diarrhea before coming to the ER. The pain is diffuse. In the ER patient had CT abdomen pelvis which shows inflammation of the small bowel segment. We were asked to admit him for further evaluation and management.     Hospital Course:  Principal Problem:  *Enteritis Active Problems:  HYPERCHOLESTEROLEMIA, PURE  HYPERTENSION, BENIGN  CAD, ARTERY BYPASS GRAFT  CAROTID ARTERY STENOSIS, WITHOUT INFARCTION  Abdominal pain   #1 Abdominal Pain: Enteritis evidenced on CT scan. Unlikely ischemic with a normal lactic acid. Has shown improvement with antibiotics, so likely infectious enteritis. Continue cipro/flagyl for 10 days. Has been tolerating a solid diet.  Rest of chronic medical issues have been stable this hospitalization and his home medications have not been altered.  Time spent on Discharge: Greater than 30 minutes.  SignedChaya Jan [facility] Pager: [phone] 09/21/2011, 9:10 AM

## 2011-09-21 NOTE — Progress Notes (Signed)
DC orders received.  Patient stable with no S/S of distress.  Discharge & medication information reviewed with patient and pt DC home. Nolon Nations

## 2011-09-24 LAB — STOOL CULTURE

## 2011-10-04 ENCOUNTER — Encounter (HOSPITAL_COMMUNITY): Payer: Self-pay | Admitting: Emergency Medicine

## 2011-10-04 ENCOUNTER — Emergency Department (HOSPITAL_COMMUNITY): Payer: Medicare Other

## 2011-10-04 ENCOUNTER — Other Ambulatory Visit: Payer: Self-pay

## 2011-10-04 ENCOUNTER — Inpatient Hospital Stay (HOSPITAL_COMMUNITY)
Admission: EM | Admit: 2011-10-04 | Discharge: 2011-10-06 | DRG: 287 | Disposition: A | Discharge status: Home / Self Care | Payer: Medicare Other | Attending: Cardiovascular Disease | Admitting: Cardiovascular Disease

## 2011-10-04 ENCOUNTER — Inpatient Hospital Stay (HOSPITAL_COMMUNITY): Payer: Medicare Other

## 2011-10-04 DIAGNOSIS — Z8249 Family history of ischemic heart disease and other diseases of the circulatory system: Secondary | ICD-10-CM

## 2011-10-04 DIAGNOSIS — Z8546 Personal history of malignant neoplasm of prostate: Secondary | ICD-10-CM | POA: Diagnosis not present

## 2011-10-04 DIAGNOSIS — Z951 Presence of aortocoronary bypass graft: Secondary | ICD-10-CM | POA: Diagnosis not present

## 2011-10-04 DIAGNOSIS — I200 Unstable angina: Secondary | ICD-10-CM | POA: Diagnosis present

## 2011-10-04 DIAGNOSIS — Z881 Allergy status to other antibiotic agents status: Secondary | ICD-10-CM | POA: Diagnosis not present

## 2011-10-04 DIAGNOSIS — F411 Generalized anxiety disorder: Secondary | ICD-10-CM | POA: Diagnosis present

## 2011-10-04 DIAGNOSIS — R9389 Abnormal findings on diagnostic imaging of other specified body structures: Secondary | ICD-10-CM | POA: Diagnosis not present

## 2011-10-04 DIAGNOSIS — Z7902 Long term (current) use of antithrombotics/antiplatelets: Secondary | ICD-10-CM

## 2011-10-04 DIAGNOSIS — Z7982 Long term (current) use of aspirin: Secondary | ICD-10-CM | POA: Diagnosis not present

## 2011-10-04 DIAGNOSIS — K219 Gastro-esophageal reflux disease without esophagitis: Secondary | ICD-10-CM | POA: Diagnosis present

## 2011-10-04 DIAGNOSIS — E7800 Pure hypercholesterolemia, unspecified: Secondary | ICD-10-CM | POA: Diagnosis not present

## 2011-10-04 DIAGNOSIS — Z79899 Other long term (current) drug therapy: Secondary | ICD-10-CM

## 2011-10-04 DIAGNOSIS — R911 Solitary pulmonary nodule: Secondary | ICD-10-CM | POA: Diagnosis not present

## 2011-10-04 DIAGNOSIS — E785 Hyperlipidemia, unspecified: Secondary | ICD-10-CM | POA: Diagnosis present

## 2011-10-04 DIAGNOSIS — Z9861 Coronary angioplasty status: Secondary | ICD-10-CM

## 2011-10-04 DIAGNOSIS — R110 Nausea: Secondary | ICD-10-CM | POA: Diagnosis not present

## 2011-10-04 DIAGNOSIS — R079 Chest pain, unspecified: Secondary | ICD-10-CM | POA: Diagnosis not present

## 2011-10-04 DIAGNOSIS — Z888 Allergy status to other drugs, medicaments and biological substances status: Secondary | ICD-10-CM | POA: Diagnosis not present

## 2011-10-04 DIAGNOSIS — R42 Dizziness and giddiness: Secondary | ICD-10-CM | POA: Diagnosis not present

## 2011-10-04 DIAGNOSIS — I10 Essential (primary) hypertension: Secondary | ICD-10-CM | POA: Diagnosis present

## 2011-10-04 DIAGNOSIS — I2510 Atherosclerotic heart disease of native coronary artery without angina pectoris: Principal | ICD-10-CM | POA: Diagnosis present

## 2011-10-04 DIAGNOSIS — D649 Anemia, unspecified: Secondary | ICD-10-CM | POA: Diagnosis not present

## 2011-10-04 DIAGNOSIS — R0602 Shortness of breath: Secondary | ICD-10-CM | POA: Diagnosis not present

## 2011-10-04 HISTORY — DX: Esophageal obstruction: K22.2

## 2011-10-04 HISTORY — DX: Cyst of kidney, acquired: N28.1

## 2011-10-04 HISTORY — DX: Angina pectoris, unspecified: I20.9

## 2011-10-04 HISTORY — DX: Anemia, unspecified: D64.9

## 2011-10-04 LAB — CBC WITH DIFFERENTIAL/PLATELET
Basophils Absolute: 0 10*3/uL (ref 0.0–0.1)
Eosinophils Absolute: 0.1 10*3/uL (ref 0.0–0.7)
Eosinophils Relative: 1 % (ref 0–5)
HCT: 32.7 % — ABNORMAL LOW (ref 39.0–52.0)
MCH: 27.8 pg (ref 26.0–34.0)
MCHC: 33.3 g/dL (ref 30.0–36.0)
MCV: 83.4 fL (ref 78.0–100.0)
Monocytes Absolute: 0.4 10*3/uL (ref 0.1–1.0)
Platelets: 222 10*3/uL (ref 150–400)
RDW: 14.8 % (ref 11.5–15.5)

## 2011-10-04 LAB — BASIC METABOLIC PANEL
BUN: 12 mg/dL (ref 6–23)
CO2: 28 mEq/L (ref 19–32)
Calcium: 8.8 mg/dL (ref 8.4–10.5)
Creatinine, Ser: 1.18 mg/dL (ref 0.50–1.35)
Glucose, Bld: 103 mg/dL — ABNORMAL HIGH (ref 70–99)

## 2011-10-04 LAB — TROPONIN I
Troponin I: <0.3 ng/mL (ref <0.30)
Troponin I: <0.3 ng/mL (ref <0.30)
Troponin I: <0.3 ng/mL (ref <0.30)

## 2011-10-04 LAB — PROTIME-INR: INR: 1.08 (ref 0.00–1.49)

## 2011-10-04 LAB — RETICULOCYTES
RBC.: 3.9 MIL/uL — ABNORMAL LOW (ref 4.22–5.81)
Retic Ct Pct: 0.7 % (ref 0.4–3.1)

## 2011-10-04 LAB — OCCULT BLOOD X 1 CARD TO LAB, STOOL: Fecal Occult Bld: NEGATIVE

## 2011-10-04 LAB — IRON AND TIBC: Saturation Ratios: 21 % (ref 20–55)

## 2011-10-04 MED ORDER — SODIUM CHLORIDE 0.9 % IV SOLN: INTRAVENOUS | Status: DC

## 2011-10-04 MED ORDER — ASPIRIN EC 81 MG PO TBEC
81.0000 mg | DELAYED_RELEASE_TABLET | Freq: Every day | ORAL | Status: DC
  Administered 2011-10-04 – 2011-10-05 (×2): 81 mg via ORAL
  Filled 2011-10-04 (×4): qty 1

## 2011-10-04 MED ORDER — NITROGLYCERIN 0.4 MG SL SUBL: 0.4000 mg | SUBLINGUAL_TABLET | SUBLINGUAL | Status: DC | PRN

## 2011-10-04 MED ORDER — HEPARIN BOLUS VIA INFUSION
4000.0000 [IU] | Freq: Once | INTRAVENOUS | Status: AC
  Administered 2011-10-04: 4000 [IU] via INTRAVENOUS

## 2011-10-04 MED ORDER — PANTOPRAZOLE SODIUM 40 MG PO TBEC
40.0000 mg | DELAYED_RELEASE_TABLET | Freq: Two times a day (BID) | ORAL | Status: DC
  Administered 2011-10-04 – 2011-10-06 (×4): 40 mg via ORAL
  Filled 2011-10-04 (×4): qty 1

## 2011-10-04 MED ORDER — IOHEXOL 350 MG/ML SOLN
100.0000 mL | Freq: Once | INTRAVENOUS | Status: AC | PRN
  Administered 2011-10-04: 100 mL via INTRAVENOUS

## 2011-10-04 MED ORDER — DIPHENHYDRAMINE HCL 25 MG PO TABS
25.0000 mg | ORAL_TABLET | Freq: Every evening | ORAL | Status: DC | PRN
  Administered 2011-10-04: 25 mg via ORAL
  Filled 2011-10-04 (×2): qty 1

## 2011-10-04 MED ORDER — CLOPIDOGREL BISULFATE 75 MG PO TABS
75.0000 mg | ORAL_TABLET | Freq: Every day | ORAL | Status: DC
  Administered 2011-10-05 – 2011-10-06 (×2): 75 mg via ORAL
  Filled 2011-10-04 (×2): qty 1

## 2011-10-04 MED ORDER — METOPROLOL TARTRATE 50 MG PO TABS
75.0000 mg | ORAL_TABLET | Freq: Two times a day (BID) | ORAL | Status: DC
  Administered 2011-10-04 – 2011-10-06 (×4): 75 mg via ORAL
  Filled 2011-10-04 (×7): qty 1

## 2011-10-04 MED ORDER — ENALAPRIL MALEATE 20 MG PO TABS
20.0000 mg | ORAL_TABLET | Freq: Two times a day (BID) | ORAL | Status: DC
  Administered 2011-10-04 – 2011-10-06 (×4): 20 mg via ORAL
  Filled 2011-10-04 (×6): qty 1

## 2011-10-04 MED ORDER — SODIUM CHLORIDE 0.9 % IV SOLN
INTRAVENOUS | Status: DC
  Administered 2011-10-04: 10:00:00 via INTRAVENOUS

## 2011-10-04 MED ORDER — METOCLOPRAMIDE HCL 5 MG PO TABS
5.0000 mg | ORAL_TABLET | Freq: Four times a day (QID) | ORAL | Status: DC | PRN
  Filled 2011-10-04: qty 1

## 2011-10-04 MED ORDER — AMLODIPINE BESYLATE 2.5 MG PO TABS
2.5000 mg | ORAL_TABLET | Freq: Two times a day (BID) | ORAL | Status: DC
  Administered 2011-10-04 – 2011-10-06 (×4): 2.5 mg via ORAL
  Filled 2011-10-04 (×6): qty 1

## 2011-10-04 MED ORDER — ACETAMINOPHEN 325 MG PO TABS: 650.0000 mg | ORAL_TABLET | ORAL | Status: DC | PRN

## 2011-10-04 MED ORDER — TAMSULOSIN HCL 0.4 MG PO CAPS
0.4000 mg | ORAL_CAPSULE | Freq: Every day | ORAL | Status: DC
  Administered 2011-10-04 – 2011-10-05 (×2): 0.4 mg via ORAL
  Filled 2011-10-04 (×4): qty 1

## 2011-10-04 MED ORDER — HEPARIN (PORCINE) IN NACL 100-0.45 UNIT/ML-% IJ SOLN
900.0000 [IU]/h | INTRAMUSCULAR | Status: DC
  Administered 2011-10-04: 1000 [IU]/h via INTRAVENOUS
  Administered 2011-10-05: 900 [IU]/h via INTRAVENOUS
  Filled 2011-10-04 (×5): qty 250

## 2011-10-04 MED ORDER — NITROGLYCERIN IN D5W 200-5 MCG/ML-% IV SOLN
10.0000 ug/min | INTRAVENOUS | Status: DC
  Administered 2011-10-04: 10 ug/min via INTRAVENOUS

## 2011-10-04 MED ORDER — ONDANSETRON HCL 4 MG/2ML IJ SOLN: 4.0000 mg | Freq: Four times a day (QID) | INTRAMUSCULAR | Status: DC | PRN

## 2011-10-04 MED ORDER — NITROGLYCERIN IN D5W 200-5 MCG/ML-% IV SOLN
INTRAVENOUS | Status: AC
  Administered 2011-10-04: 10 ug/min via INTRAVENOUS
  Filled 2011-10-04: qty 250

## 2011-10-04 MED ORDER — ASPIRIN 81 MG PO CHEW
324.0000 mg | CHEWABLE_TABLET | ORAL | Status: AC
  Administered 2011-10-05: 324 mg via ORAL
  Filled 2011-10-04: qty 4

## 2011-10-04 NOTE — Progress Notes (Signed)
D-dimer mildly elevated. Per pt's chart has hx of shellfish allergy but no hx of allergy to contrast dye. I confirmed this with the patient. Note that he received IV contrast without premed without adverse reaction 09/19/11 according to Dr [facility] from that admission. I discussed this with radiology and they confirm that he did not require premed per their records. Will proceed with CT angio. Plan discussed with pt. If negative for PE, plan hydration afterwards and cardiac cath in AM. Ronie Spies PA-C

## 2011-10-04 NOTE — ED Notes (Signed)
Dr Excell Seltzer aware of pts D-Dimer

## 2011-10-04 NOTE — ED Provider Notes (Signed)
History     CSN: [id number]  Arrival date & time 10/04/11  6578   First MD Initiated Contact with Patient 10/04/11 [phone]      Chief Complaint  Patient presents with  . Chest Pain    (Consider location/radiation/quality/duration/timing/severity/associated sxs/prior treatment) Patient is a 71 y.o. male presenting with chest pain. The history is provided by the patient and medical records.  Chest Pain Primary symptoms include shortness of breath and nausea. Pertinent negatives for primary symptoms include no fever and no abdominal pain.  Pertinent negatives for associated symptoms include no diaphoresis.    71 year old, male, with known coronary artery disease, prior CABG, and stents, presents to the emergency department complaining of exertional chest pain, difficulty breathing, with some nausea.  For the past several days.  His last stent.  He believes was a approximately 1 year ago.  About 4 days ago.  He has developed exertional chest pain, with difficulty catching his breath, and nausea.  He has not had sweating.  He denies fevers, chills, cough, leg pain or swelling.  This morning, when his symptoms began after he had walked out to feed his cats.  He called EMS.  They gave him 3 nitroglycerin and aspirin.  His pain has subsided, but it is still a 3/10 in intensity.  Level V caveat applies for urgent need for intervention  Past Medical History  Diagnosis Date  . Hypertension   . Prostate cancer   . Pancreatitis Dec 2011    ERCP ok.  . Hiatal hernia   . Coronary artery disease     s/p CABG in 2001, s/p DES to left main in the past as well as stenting of the LCX and RCA. Negative stress test in December 2011 but continued to have pain. S/P repeat cath showing patent radial graft to the DX with total occlusion of the distal limb to the LCX and a patent LIMA to LAD. Stentsin the left main, LCX & RCA were patent but did have DES to the LCX for high grade bifurcational AV/OM   . Chronic  chest pain   . Left bundle branch block   . Carotid disease, bilateral   . GERD (gastroesophageal reflux disease)   . Hyperlipidemia   . Inguinal hernia   . Statin intolerance   . Anxiety disorder   . Peripheral vascular disease 04/10/11    Past Surgical History  Procedure Date  . Heart stents   . Cholecystectomy   . Inguinal hernia repair     bilateral  . Coronary artery bypass graft 2001  . Endarterectomy 04/14/2011    Procedure: ENDARTERECTOMY CAROTID;  Surgeon: Pryor Ochoa, MD;  Location: [facility] OR;  Service: Vascular;  Laterality: Left;  Would like to perform procedure first, at 0730    Family History  Problem Relation Age of Onset  . Heart disease Mother     Heart Disease before age 36  . Hypertension Mother   . Cancer Father   . Hypertension Sister   . Heart disease Sister     Heart Disease before age 65  . Hypertension Brother   . Heart disease Brother     Heart Disease before age 20  . Heart attack Brother     History  Substance Use Topics  . Smoking status: Never Smoker   . Smokeless tobacco: Current User    Types: Chew  . Alcohol Use: No      Review of Systems  Constitutional: Negative for fever, chills and  diaphoresis.  HENT: Negative for neck pain.   Respiratory: Positive for chest tightness and shortness of breath.   Cardiovascular: Positive for chest pain. Negative for leg swelling.  Gastrointestinal: Positive for nausea. Negative for abdominal pain.  Neurological: Negative for light-headedness.  All other systems reviewed and are negative.    Allergies  Amoxicillin-pot clavulanate; Codeine; Hydrocodone; Morphine; Nitrofuran derivatives; Other; Oxycodone hcl; Shellfish allergy; Tramadol; and Zolpidem tartrate  Home Medications   Current Outpatient Rx  Name Route Sig Dispense Refill  . AMLODIPINE BESYLATE 5 MG PO TABS Oral Take 2.5 mg by mouth 2 (two) times daily.    . ASPIRIN EC 81 MG PO TBEC Oral Take 81 mg by mouth at bedtime.     .  CLOPIDOGREL BISULFATE 75 MG PO TABS Oral Take 1 tablet (75 mg total) by mouth daily. 30 tablet 11  . DIPHENHYDRAMINE HCL 25 MG PO TABS Oral Take 25 mg by mouth at bedtime. sleep    . ENALAPRIL MALEATE 20 MG PO TABS Oral Take 20 mg by mouth 2 (two) times daily.    . ISOSORBIDE MONONITRATE ER 60 MG PO TB24 Oral Take 30 mg by mouth 2 (two) times daily.    Marland Kitchen METOCLOPRAMIDE HCL 10 MG PO TABS Oral Take 5 mg by mouth 4 (four) times daily as needed. Stomach pain    . METOPROLOL TARTRATE 50 MG PO TABS Oral Take 75 mg by mouth 2 (two) times daily.    Marland Kitchen NITROGLYCERIN 0.4 MG SL SUBL Sublingual Place 0.4 mg under the tongue every 5 (five) minutes as needed. For chest pain    . PANTOPRAZOLE SODIUM 40 MG PO TBEC Oral Take 40 mg by mouth 2 (two) times daily.     Marland Kitchen TAMSULOSIN HCL 0.4 MG PO CAPS Oral Take 1 capsule (0.4 mg total) by mouth daily after supper. 30 capsule 6    BP 119/62  Pulse 79  Temp 97.7 F (36.5 C) (Oral)  Resp 15  SpO2 99%  Physical Exam  Nursing note and vitals reviewed. Constitutional: He is oriented to person, place, and time. He appears well-developed and well-nourished.  HENT:  Head: Normocephalic and atraumatic.  Eyes: Conjunctivae normal are normal.  Neck: Normal range of motion. Neck supple.  Cardiovascular: Normal rate and intact distal pulses.   No murmur heard. Pulmonary/Chest: Effort normal and breath sounds normal. No respiratory distress.  Abdominal: Soft. He exhibits no distension. There is no tenderness.  Musculoskeletal: Normal range of motion.  Neurological: He is alert and oriented to person, place, and time.  Skin: Skin is warm and dry.  Psychiatric: He has a normal mood and affect. Thought content normal.    ED Course  Procedures (including critical care time) 71 year old, male, with symptoms consistent with unstable angina.  We will start him on nitroglycerin and heparin, and consult cardiology, for further evaluation.   Labs Reviewed  BASIC METABOLIC  PANEL  CBC WITH DIFFERENTIAL  TROPONIN I   No results found.   No diagnosis found.  I spoke with cardiology they will come, evaluate him for admission to 2 unstable angina  ECG. Normal sinus rhythm at 80 beats per minute. Left axis deviation. Left bundle branch block. Nonspecific T-wave changes  CRITICAL CARE Performed by: Cheri Guppy P   Total critical care time: 30 min  Critical care time was exclusive of separately billable procedures and treating other patients.  Critical care was necessary to treat or prevent imminent or life-threatening deterioration.  Critical care was time spent  personally by me on the following activities: development of treatment plan with patient and/or surrogate as well as nursing, discussions with consultants, evaluation of patient's response to treatment, examination of patient, obtaining history from patient or surrogate, ordering and performing treatments and interventions, ordering and review of laboratory studies, ordering and review of radiographic studies, pulse oximetry and re-evaluation of patient's condition.  MDM  Unstable angina        Cheri Guppy, MD 10/04/11 1017

## 2011-10-04 NOTE — ED Notes (Signed)
Pt brought to CT scan on monitor with drips still running and this RN at bedside. On arrival to radiology, noticed several other patients waiting to have scans completed.

## 2011-10-04 NOTE — ED Notes (Signed)
Dr. Weldon Inches at the bedside.

## 2011-10-04 NOTE — Progress Notes (Signed)
ANTICOAGULATION CONSULT NOTE - Initial Consult  Pharmacy Consult for Heparin Indication: chest pain/ACS  Allergies  Allergen Reactions  . Amoxicillin-Pot Clavulanate     REACTION: 'Burns' Stomach  . Codeine Nausea And Vomiting  . Hydrocodone Nausea And Vomiting  . Morphine Nausea And Vomiting  . Nitrofuran Derivatives   . Other     Mycins  . Oxycodone Hcl Nausea And Vomiting  . Shellfish Allergy Swelling  . Tramadol Nausea And Vomiting  . Zolpidem Tartrate Other (See Comments)    hallucinations    Patient Measurements: Height: 5' 4.96" (165 cm) Weight: 146 lb 9.7 oz (66.5 kg) IBW/kg (Calculated) : 61.41   Vital Signs: Temp: 97.7 F (36.5 C) (09/11 0840) Temp src: Oral (09/11 0840) BP: 119/57 mmHg (09/11 1321) Pulse Rate: 78  (09/11 1321)  Labs:  Basename 10/04/11 0933 10/04/11 0932  HGB -- 10.9*  HCT -- 32.7*  PLT -- 222  APTT -- --  LABPROT -- --  INR -- --  HEPARINUNFRC -- --  CREATININE -- 1.18  CKTOTAL -- --  CKMB -- --  TROPONINI <0.30 --    Estimated Creatinine Clearance: 49.9 ml/min (by C-G formula based on Cr of 1.18).   Medical History: Past Medical History  Diagnosis Date  . Hypertension   . Prostate cancer     s/p cryoablation  . Pancreatitis Dec 2011    ERCP ok.  . Hiatal hernia   . Coronary artery disease     a. S/p CABG in 2001. b. S/p DES to protected LM and BMS to RCA 2006. c. 12/2009: had CP with negative stress test with repeat cath s/p DES to Cx.  . Chronic chest pain   . Left bundle branch block   . GERD (gastroesophageal reflux disease)   . Hyperlipidemia   . Inguinal hernia   . Statin intolerance   . Anxiety disorder   . Peripheral vascular disease 04/10/11    a. s/p left carotid endarterectomy 04/14/2011.  Marland Kitchen Esophageal stricture   . Renal cyst     Seen on CT 08/2011 also with circumferential bladder wall thickening    Medications:  Infusions:    . sodium chloride    . heparin 1,000 Units/hr (10/04/11 1202)  .  nitroGLYCERIN 10 mcg/min (10/04/11 0935)  . DISCONTD: sodium chloride 125 mL/hr at 10/04/11 4098    Assessment: Chest Pain:  Heparin started in the ED at a rate of ~15 units/kg/hr.  This is slightly above a guideline-recommended rate of 14 units/kg/hr.  Goal of Therapy:  Heparin level 0.3-0.7 units/ml Monitor platelets by anticoagulation protocol: Yes   Plan:  Decrease Heparin to 900 units/hr Check Heparin level in 8 hours Daily Heparin level and CBC  Estella Husk, Pharm.D., BCPS Clinical Pharmacist  Phone [phone] Pager [phone] 10/04/2011, 2:35 PM

## 2011-10-04 NOTE — ED Notes (Signed)
Pt into CT 1 and onto table at this time.

## 2011-10-04 NOTE — ED Notes (Signed)
[place] PA in at bedside.

## 2011-10-04 NOTE — ED Notes (Signed)
Dr. Copper in ER to see pt.

## 2011-10-04 NOTE — Progress Notes (Signed)
ANTICOAGULATION CONSULT NOTE   Pharmacy Consult for Heparin Indication: chest pain/ACS  Allergies  Allergen Reactions  . Amoxicillin-Pot Clavulanate     REACTION: 'Burns' Stomach  . Codeine Nausea And Vomiting  . Hydrocodone Nausea And Vomiting  . Morphine Nausea And Vomiting  . Nitrofuran Derivatives   . Other     Mycins  . Oxycodone Hcl Nausea And Vomiting  . Shellfish Allergy Swelling  . Tramadol Nausea And Vomiting  . Zolpidem Tartrate Other (See Comments)    hallucinations    Patient Measurements: Height: 5\' 5"  (165.1 cm) Weight: 144 lb 2.9 oz (65.4 kg) IBW/kg (Calculated) : 61.5   Vital Signs: Temp: 98.5 F (36.9 C) (09/11 1958) Temp src: Oral (09/11 1958) BP: 122/63 mmHg (09/11 2140) Pulse Rate: 66  (09/11 2100)  Labs:  Basename 10/04/11 2316 10/04/11 2054 10/04/11 1502 10/04/11 1459 10/04/11 0933 10/04/11 0932  HGB -- -- -- -- -- 10.9*  HCT -- -- -- -- -- 32.7*  PLT -- -- -- -- -- 222  APTT -- -- -- -- -- --  LABPROT -- -- 14.2 -- -- --  INR -- -- 1.08 -- -- --  HEPARINUNFRC 0.39 -- -- -- -- --  CREATININE -- -- -- -- -- 1.18  CKTOTAL -- -- -- -- -- --  CKMB -- -- -- -- -- --  TROPONINI -- <0.30 -- <0.30 <0.30 --    Estimated Creatinine Clearance: 49.9 ml/min (by C-G formula based on Cr of 1.18).  Assessment: 71 yo male with chest pain for Heparin  Goal of Therapy:  Heparin level 0.3-0.7 units/ml Monitor platelets by anticoagulation protocol: Yes   Plan:  Continue Heparin at current rate Follow-up am labs.  Geannie Risen, PharmD, BCPS

## 2011-10-04 NOTE — H&P (Signed)
CARDIOLOGY CONSULT NOTE  Patient ID: [name] [name], MRN: [id number], DOB/AGE: 02/04/1940 71 y.o. Admit date: 10/04/2011   Date of Consult: 10/04/2011 Primary Physician: Ailene Ravel, MD Primary Cardiologist: Dr. Jens Som  Chief Complaint: chest pain  HPI: [name] [name] is a 71 y/o M with hx of LBBB, CAD s/p CABG 2001 with stenting to the LM/RCA/Cx since, carotid disease who presented to [facility] with complaints of chest pain. He was recently admitted 8/27-8/29 with abd pain felt to be infectious enteritis and was discharged with Cipro/Flagyl and has done well since then. He has a history of chest pain, which typically occurs with morning activity but then eases off several minutes into the activity. However, over the past 4 days, he has noticed increasing substernal sharp chest discomfort each time he exerts himself that does not get better as the activity goes on. This has caused him to stop and rest, which typically eases the pain. He reports associated SOB like he can't take a deep breath. His wife reports that he is also very diaphoretic when this happened. Sat 9/7, he developed nausea and dry heaves but he did not have chest pain at that time. No futher abd pain. He also began having intermittent dizziness similar to his prior vertigo which has been made better by vertigo medicine (he cannot remember the name). He denies any presyncope, syncope, LEE, orthopnea, PND, recent travel/bedrest/injury, melena, hematemesis, or hematuria. He is compliant with medicines. He believes this feels different than his prior angina. His wife was in the hospital last week and received stents by Dr. Riley Kill as well as news that she had an concerning enlarging pulm nodule.  Today in particular he was feeding his cat when he developed similar SSCP that radiated to his neck, back and shoulders. He checked his BP which was 160 systolic and HR was 130s which was unusual as he usually runs in the 60s. He reports  associated heart racing sensation. His wife called 911, he took 4 baby ASA, and symptoms had begun to subside by the time that EMS arrived. Their tracings indicate HR of 98-111 in sinus tach. He was given 1 SL NTG which gradual ease of relief. The episode lasted 30 minutes total. He had similar discomfort here in the ED and was put on a NTG gtt at 75mcg/min and is pain free. EKG demonstrates NSR 80bpm LBBB with associated TWI changes, appears similar (if not somewhat improved from tracing 8/27.) Hgb 10.9 (was 10.3 8/29), troponin neg x 1.   Of note, last admission CT abd pelvis incidentally noted multiple renal cysts the majority of which were simple with recommendation for nonemergent [place] followup. UA 8/27 small bili, 15 ketones, neg Hgb.   Past Medical History  Diagnosis Date  . Hypertension   . Prostate cancer     s/p cryoablation  . Pancreatitis Dec 2011    ERCP ok.  . Hiatal hernia   . Coronary artery disease     a. S/p CABG in 2001. b. S/p DES to protected LM and BMS to RCA 2006. c. 12/2009: had CP with negative stress test with repeat cath s/p DES to Cx.  . Chronic chest pain   . Left bundle branch block   . GERD (gastroesophageal reflux disease)   . Hyperlipidemia   . Inguinal hernia   . Statin intolerance   . Anxiety disorder   . Peripheral vascular disease 04/10/11    a. s/p left carotid endarterectomy 04/14/2011.  Marland Kitchen Esophageal  stricture   . Renal cyst     Seen on CT 08/2011 also with circumferential bladder wall thickening    The pt states he was seen by uro 2 weeks ago and told "everything was ok" with regards to his prostate CA.  Most Recent Cardiac Studies: Myoview 04/13/11 Stress Myoview 04/13/2011 Small fixed non reversible perfusion defect/scar versus apical thinning at the septal portion of left to right apex. Normal left ventricular ejection fraction of 64%. Anterior septal hypokinesia. No significant interval change.  12/2009 Cath RESULTS:  Aortic pressure was  143/71 with mean of 101.  Left ventricular pressure was 143/18. The left main coronary artery:  The left main coronary artery had 0% stenosis at the stent, which extended into the circumflex artery. Left anterior descending artery:  Left anterior descending artery was completely occluded at its origin. The circumflex artery:  The circumflex artery gave rise to a ramus branch and a marginal branch and an AV branch was terminated in a posterolateral branch.  There was a bifurcation lesion at the division of the AV circumflex with the marginal branch.  There was 90% stenosis just before and just after the marginal branch and 70% stenosis at the ostium of the marginal branch. The right coronary artery:  The right coronary artery was a moderately large vessel that gave rise to right ventricular branch, posterior descending branch, and two posterolateral branches.  There was 0% stenosis at the stent site in the proximal and ostium of the right coronary artery.  There were 40 and 30% stenoses in the midvessel. The radial graft to the diagonal branch of the LAD and marginal branch of circumflex artery was opened in its proximal limb, but the distal limb was occluded. This had not changed from prior study. The LIMA graft to the LAD was patent and functioned normally. The left ventriculogram:  The left ventriculogram performed in the RAO projection showed good wall motion with no areas of hypokinesis.  The estimated ejection fraction was 60%. CONCLUSION: 1. Coronary artery disease, status post prior coronary bypass graft     surgery. 2. Severe native vessel disease with 0% stenosis at the stent site in     the left main and proximal circumflex artery, total occlusion of     the left anterior descending artery, 90% stenosis at a bifurcation     of the AV circumflex and marginal branch, 0% stenosis at the ostium     of the stent in the right coronary artery, and 40% narrowing in the      mid-right coronary artery. 3. Patent radial graft to the diagonal branch of the LAD with     occlusion of its distal limb to the circumflex marginal vessel, and     patent LIMA graft to the LAD. 4. Normal LV function. RECOMMENDATIONS:  I think the culprit for the patient's unstable angina are the tight lesions in the marginal and AV circumflex artery, which is a bifurcation lesion.  This is a very difficult lesion for intervention because of a sharp bend from the left main into the vessel and because of stents in this vessel making this not compliant.  I think it is feasible to do, and I think we will plan to try and attempt to open this vessel tomorrow.    Surgical History:  Past Surgical History  Procedure Date  . Heart stents   . Cholecystectomy   . Inguinal hernia repair     bilateral  . Coronary artery bypass  graft 2001  . Endarterectomy 04/14/2011    Procedure: ENDARTERECTOMY CAROTID;  Surgeon: Pryor Ochoa, MD;  Location: [facility] OR;  Service: Vascular;  Laterality: Left;  Would like to perform procedure first, at 0730     Home Meds: Prior to Admission medications   Medication Sig Start Date End Date Taking? Authorizing Provider  amLODipine (NORVASC) 5 MG tablet Take 2.5 mg by mouth 2 (two) times daily.   Yes Historical Provider, MD  aspirin EC 81 MG tablet Take 81 mg by mouth at bedtime.    Yes Historical Provider, MD  clopidogrel (PLAVIX) 75 MG tablet Take 1 tablet (75 mg total) by mouth daily. 04/07/11  Yes Rosalio Macadamia, NP  diphenhydrAMINE (BENADRYL) 25 MG tablet Take 25 mg by mouth at bedtime. sleep   Yes Historical Provider, MD  enalapril (VASOTEC) 20 MG tablet Take 20 mg by mouth 2 (two) times daily.   Yes Historical Provider, MD  isosorbide mononitrate (IMDUR) 60 MG 24 hr tablet Take 30 mg by mouth 2 (two) times daily.   Yes Historical Provider, MD  metoCLOPramide (REGLAN) 10 MG tablet Take 5 mg by mouth 4 (four) times daily as needed. Stomach pain   Yes Historical  Provider, MD  metoprolol (LOPRESSOR) 50 MG tablet Take 75 mg by mouth 2 (two) times daily.   Yes Historical Provider, MD  nitroGLYCERIN (NITROSTAT) 0.4 MG SL tablet Place 0.4 mg under the tongue every 5 (five) minutes as needed. For chest pain   Yes Historical Provider, MD  pantoprazole (PROTONIX) 40 MG tablet Take 40 mg by mouth 2 (two) times daily.    Yes Historical Provider, MD  Tamsulosin HCl (FLOMAX) 0.4 MG CAPS Take 1 capsule (0.4 mg total) by mouth daily after supper. 04/15/11  Yes Jodelle Gross, NP    Inpatient Medications:     . heparin  60 Units/kg Intravenous Once    Allergies:  Allergies  Allergen Reactions  . Amoxicillin-Pot Clavulanate     REACTION: 'Burns' Stomach  . Codeine Nausea And Vomiting  . Hydrocodone Nausea And Vomiting  . Morphine Nausea And Vomiting  . Nitrofuran Derivatives   . Other     Mycins  . Oxycodone Hcl Nausea And Vomiting  . Shellfish Allergy Swelling  . Tramadol Nausea And Vomiting  . Zolpidem Tartrate Other (See Comments)    hallucinations    History   Social History  . Marital Status: Married    Spouse Name: N/A    Number of Children: N/A  . Years of Education: N/A   Occupational History  . Not on file.   Social History Main Topics  . Smoking status: Never Smoker   . Smokeless tobacco: Current User    Types: Chew  . Alcohol Use: No  . Drug Use: No  . Sexually Active:    Other Topics Concern  . Not on file   Social History Narrative  . No narrative on file     Family History  Problem Relation Age of Onset  . Heart disease Mother     Heart Disease before age 38  . Hypertension Mother   . Cancer Father   . Hypertension Sister   . Heart disease Sister     Heart Disease before age 17  . Hypertension Brother   . Heart disease Brother     Heart Disease before age 36  . Heart attack Brother      Review of Systems: General: negative for chills, fever, night sweats or  weight changes.  Cardiovascular: see  above Dermatological: negative for rash Respiratory: negative for cough or wheezing Urologic: negative for hematuria Abdominal: negative for diarrhea, bright red blood per rectum, melena, or hematemesis. See above. Neurologic: negative for visual changes, syncope. Occasional dizziness similar to his prior vertigo. All other systems reviewed and are otherwise negative except as noted above.  Labs:  [facility] 10/04/11 0933  CKTOTAL --  CKMB --  TROPONINI <0.30   Lab Results  Component Value Date   WBC 4.9 10/04/2011   HGB 10.9* 10/04/2011   HCT 32.7* 10/04/2011   MCV 83.4 10/04/2011   PLT 222 10/04/2011     Lab 10/04/11 0932  NA 140  K 4.6  CL 106  CO2 28  BUN 12  CREATININE 1.18  CALCIUM 8.8  PROT --  BILITOT --  ALKPHOS --  ALT --  AST --  GLUCOSE 103*    Radiology/Studies:   CXR: IMPRESSION: 1. No radiographic evidence of acute cardiopulmonary disease. 2. Borderline cardiomegaly. 3. Atherosclerosis. 4. Status post CABG with LIMA.  Ct Abdomen Pelvis W Contrast 09/20/2011  *RADIOLOGY REPORT*  Clinical Data: Abdominal pain  CT ABDOMEN AND PELVIS WITH CONTRAST  Technique:  Multidetector CT imaging of the abdomen and pelvis was performed following the standard protocol during bolus administration of intravenous contrast.  Contrast: OMNIPAQUE IOHEXOL 300 MG/ML  SOLN  Comparison: 03/04/2011  Findings: Heart size mildly enlarged.  Coronary artery calcification.  Multiple hepatic hypodensities are nonspecific however unchanged. There is a small amount of perihepatic fluid.  Unremarkable spleen. Unremarkable pancreas and adrenal glands.  Absent gallbladder.  No biliary ductal dilatation.  Tiny nonobstructing stone within the left kidney.  There are multiple renal cysts, the majority of which are simple.  Some are incompletely characterized.  No hydronephrosis or hydroureter.  Colonic diverticulosis.  No CT evidence for diverticulitis or colitis.  Long segment of distal small  bowel with wall thickening and mucosal hyperenhancement.  Interloop fluid.  Small amount of left paracolic gutter and perisplenic fluid.  No free intraperitoneal air.  No pneumatosis.  There is scattered atherosclerotic calcification of the aorta and its branches. No aneurysmal dilatation.  Decompressed bladder limits evaluation.  Multilevel degenerative changes of the imaged spine. No acute or aggressive appearing osseous lesion.  Status post median sternotomy.  IMPRESSION: Long segment of thickened small bowel with associate mucosal hyperenhancement.  The pattern is in keeping with a nonspecific enteritis; infectious, ischemic, and inflammatory considerations.  Circumferential bladder wall thickening is nonspecific given incomplete distension.  Correlate with urinalysis.  Small amount ascites is presumably reactive.  Multiple renal cysts and incompletely characterize hypodensities. Consider non emergent ultrasound follow-up.   Original Report Authenticated By: Waneta Martins, M.D.    EKG: NSR 80bpm LBBB with associated TWI changes, appears similar (if not somewhat improved from tracing 8/27.)  Physical Exam: Blood pressure 106/61, pulse 69, temperature 97.7 F (36.5 C), temperature source Oral, resp. rate 13, height 5' 4.96" (1.65 m), weight 146 lb 9.7 oz (66.5 kg), SpO2 99.00%. General: Well developed, well nourished WM in no acute distress. Head: Normocephalic, atraumatic, sclera non-icteric, no xanthomas, nares are without discharge.  Neck: Negative for carotid bruits. JVD not elevated. Lungs: Clear bilaterally to auscultation without wheezes, rales, or rhonchi. Breathing is unlabored. Heart: RRR with S1 S2. No murmurs, rubs, or gallops appreciated. Abdomen: Soft, non-tender, non-distended with normoactive bowel sounds. No hepatomegaly. No rebound/guarding. No obvious abdominal masses. Msk:  Strength and tone appear normal for age. Extremities:  No clubbing or cyanosis. No edema.  Distal pedal  pulses are 2+ and equal bilaterally. Neuro: Alert and oriented X 3. Moves all extremities spontaneously. Psych:  Responds to questions appropriately with a normal affect.   Assessment and Plan:   1. Chest pain concerning for unstable angina - PE is also in the differential given sharp quality, hx of prostate CA, sinus tach/dyspnea earlier today, although pain was not pleuritic and sx were relieve with NTG. Will check d-dimer. If abnormal, will need CT angio. If normal, will proceed with cath to exclude obstructive CAD as his symptoms are concerning enough to warrant angiography. This might be able to be done today. Cycle CE's. Continue heparin as ordered by the ED. 2. CAD s/p CABG 2001, DES?LM/BMS?RCA 2006, DES?Cx 2011 - continue ASA, BB, Plavix, heparin as ordered in the ED. Hold Imdur as he is on a NTG gtt which will be continued. No statin due to hx of intolerance. See above. 3. Tachycardia - he reports HR in the 130's earlier today and EMS tracings shortly after indicate HR 111 which was sinus tachycardia. Follow on tele. Presently in the 60s. 4. Carotid artery disease - continue ASA/Plavix.  5. Anemia - stable since DC 8/29 when it was 10.3. Hgb has been variable. Was 14.4 two weeks ago but this was in the setting of abd pain and possible decreased PO intake. Hgb prior to that was 10.6 in March 2013. No signs/sx of bleeding. Follow. UA without blood last admission. Will hemoccult stools and check anemia panel. 6. H/o prostate CA - stable per pt.  7. Renal cysts/bladder wall thickening by recent CT scan - UA without infection last admission. Consider nonemergent renal US as suggested by prior imaging.  Signed, Ronie Spies PA-C 10/04/2011, 10:23 AM   Patient seen, examined. Available data reviewed. Agree with findings, assessment, and plan as outlined by Ronie Spies, PA-C. The patient was independently interviewed and examined. I reviewed all available data. He has progressive CP and dyspnea and  unexplained tachycardia (sinus tach) with low-level exertion. I think he needs to be ruled out for PE so we will check a d-dimer and if positive will do a CT chest. If negative, I think we should proceed with cath possible PCI. He has undergone multiple PCI procedures post-CABG and has high risk of recurrent symptomatic CAD. Risks, indication of cath and possible PCI reviewed with patient and wife. They understand and agree to proceed.  Tonny Bollman, M.D. 10/04/2011 11:02 AM

## 2011-10-04 NOTE — ED Notes (Signed)
Pt states cp that comes and goes x 4 days this am had some pressure that got worse when he went to feed his cat. Has hx of cabg  X 2 years ago and stent x 1 year ago Corinda Gubler cards is his dr

## 2011-10-05 ENCOUNTER — Encounter (HOSPITAL_COMMUNITY)
Admission: EM | Disposition: A | Discharge status: Home / Self Care | Payer: Self-pay | Source: Home / Self Care | Attending: Cardiovascular Disease

## 2011-10-05 DIAGNOSIS — I2510 Atherosclerotic heart disease of native coronary artery without angina pectoris: Secondary | ICD-10-CM

## 2011-10-05 HISTORY — PX: LEFT HEART CATHETERIZATION WITH CORONARY/GRAFT ANGIOGRAM: SHX5450

## 2011-10-05 LAB — CBC
HCT: 33.8 % — ABNORMAL LOW (ref 39.0–52.0)
MCH: 27.9 pg (ref 26.0–34.0)
MCH: 28.2 pg (ref 26.0–34.0)
MCHC: 33.1 g/dL (ref 30.0–36.0)
MCV: 84.3 fL (ref 78.0–100.0)
MCV: 83.6 fL (ref 78.0–100.0)
Platelets: 196 10*3/uL (ref 150–400)
Platelets: 193 10*3/uL (ref 150–400)
RBC: 4.26 MIL/uL (ref 4.22–5.81)
RDW: 15 % (ref 11.5–15.5)
RDW: 14.9 % (ref 11.5–15.5)
WBC: 4.7 10*3/uL (ref 4.0–10.5)
WBC: 4.1 10*3/uL (ref 4.0–10.5)

## 2011-10-05 LAB — LIPID PANEL
Cholesterol: 225 mg/dL — ABNORMAL HIGH (ref 0–200)
HDL: 39 mg/dL — ABNORMAL LOW (ref >39)
Total CHOL/HDL Ratio: 5.8 RATIO
VLDL: 32 mg/dL (ref 0–40)

## 2011-10-05 LAB — TROPONIN I: Troponin I: <0.3 ng/mL (ref <0.30)

## 2011-10-05 LAB — VITAMIN B12: Vitamin B-12: 193 pg/mL — ABNORMAL LOW (ref 211–911)

## 2011-10-05 LAB — HEPARIN LEVEL (UNFRACTIONATED): Heparin Unfractionated: 0.39 IU/mL (ref 0.30–0.70)

## 2011-10-05 LAB — CREATININE, SERUM
Creatinine, Ser: 1.04 mg/dL (ref 0.50–1.35)
GFR calc Af Amer: 81 mL/min — ABNORMAL LOW (ref >90)

## 2011-10-05 LAB — COMPREHENSIVE METABOLIC PANEL
AST: 15 U/L (ref 0–37)
Albumin: 3.4 g/dL — ABNORMAL LOW (ref 3.5–5.2)
BUN: 9 mg/dL (ref 6–23)
Calcium: 8.8 mg/dL (ref 8.4–10.5)
Chloride: 107 mEq/L (ref 96–112)
Creatinine, Ser: 1.11 mg/dL (ref 0.50–1.35)
Total Protein: 6.1 g/dL (ref 6.0–8.3)

## 2011-10-05 LAB — POCT ACTIVATED CLOTTING TIME: Activated Clotting Time: 140 seconds

## 2011-10-05 LAB — FERRITIN: Ferritin: 10 ng/mL — ABNORMAL LOW (ref 22–322)

## 2011-10-05 LAB — GLUCOSE, CAPILLARY: Glucose-Capillary: 88 mg/dL (ref 70–99)

## 2011-10-05 SURGERY — LEFT HEART CATHETERIZATION WITH CORONARY/GRAFT ANGIOGRAM
Anesthesia: LOCAL

## 2011-10-05 MED ORDER — NITROGLYCERIN 0.2 MG/ML ON CALL CATH LAB
INTRAVENOUS | Status: AC
  Filled 2011-10-05: qty 1

## 2011-10-05 MED ORDER — ENOXAPARIN SODIUM 40 MG/0.4ML ~~LOC~~ SOLN
40.0000 mg | SUBCUTANEOUS | Status: DC
  Filled 2011-10-05 (×2): qty 0.4

## 2011-10-05 MED ORDER — LIDOCAINE HCL (PF) 1 % IJ SOLN
INTRAMUSCULAR | Status: AC
  Filled 2011-10-05: qty 30

## 2011-10-05 MED ORDER — MIDAZOLAM HCL 2 MG/2ML IJ SOLN
INTRAMUSCULAR | Status: AC
  Filled 2011-10-05: qty 2

## 2011-10-05 MED ORDER — FENTANYL CITRATE 0.05 MG/ML IJ SOLN
INTRAMUSCULAR | Status: AC
  Filled 2011-10-05: qty 2

## 2011-10-05 MED ORDER — SODIUM CHLORIDE 0.9 % IV SOLN: INTRAVENOUS | Status: AC

## 2011-10-05 NOTE — Progress Notes (Signed)
@   Subjective:  "Not much" CP; no dyspnea   Objective:  Filed Vitals:   10/05/11 0021 10/05/11 0022 10/05/11 0100 10/05/11 0400  BP:    114/61  Pulse: 70 66 57 62  Temp:    97.9 F (36.6 C)  TempSrc:    Oral  Resp: 22 18 17 16  Height:      Weight:      SpO2: 96% 95% 95% 97%    Intake/Output from previous day:  Intake/Output Summary (Last 24 hours) at 10/05/11 0726 Last data filed at 10/05/11 0634  Gross per 24 hour  Intake 1817.22 ml  Output   2025 ml  Net -207.78 ml    Physical Exam: Physical exam: Well-developed well-nourished in no acute distress.  Skin is warm and dry.  HEENT is normal.  Neck is supple.  Chest is clear to auscultation with normal expansion.  Cardiovascular exam is regular rate and rhythm.  Abdominal exam nontender or distended. No masses palpated. Extremities show no edema. neuro grossly intact    Lab Results: Basic Metabolic Panel:  Basename 10/05/11 0455 10/04/11 0932  NA 142 140  K 4.2 4.6  CL 107 106  CO2 26 28  GLUCOSE 90 103*  BUN 9 12  CREATININE 1.11 1.18  CALCIUM 8.8 8.8  MG -- --  PHOS -- --   CBC:  Basename 10/05/11 0455 10/04/11 0932  WBC 4.7 4.9  NEUTROABS -- 3.3  HGB 11.2* 10.9*  HCT 33.8* 32.7*  MCV 84.3 83.4  PLT 196 222   Cardiac Enzymes:  Basename 10/05/11 0217 10/04/11 2054 10/04/11 1459  CKTOTAL -- -- --  CKMB -- -- --  CKMBINDEX -- -- --  TROPONINI <0.30 <0.30 <0.30     Assessment/Plan:  1) CP - enzymes negative; for cath today; patient has had problems with chronic CP;  patient states present symptoms are different compared to previous. CT shows no pulmonary embolus. 2) CAD - continue ASA, metoprolol 3) HTN - BP controlled; continue present meds 4) Hyperlipidemia - intoleratant to statins; continue diet 5) Chronic anemia - Heme Neg; low ferritin; fu primary care after DC. Hgb stable. 6) Recent infectious enteritis. [name] [name] 10/05/2011, 7:26 AM    

## 2011-10-05 NOTE — CV Procedure (Signed)
   Cardiac Catheterization Procedure Note  Name: [name] [name] MRN: [id number] DOB: 1940-07-07  Procedure: Left Heart Cath, Selective Coronary Angiography,  SVG and LIMA angiography, LV angiography  Indication: chest pain with prior cath, CABG and PCI.  Last complex bifurcation PCI in 2011.  For restudy to assess coronary anatomy.    Procedural details: The right groin was prepped, draped, and anesthetized with 1% lidocaine. Using modified Seldinger technique, a 5 French sheath was introduced into the right femoral artery. Standard Judkins catheters were used for coronary angiography and left ventriculography. LCB cath was used for the radial graft, and IMA for the LIMA.  Catheter exchanges were performed over a guidewire. There were no immediate procedural complications. The patient was transferred to the post catheterization recovery area for further monitoring.  I reviewed the films with the patient and his family  Procedural Findings: Hemodynamics:  AO 146/71 (101) LV 161/25 No gradient   Coronary angiography: Coronary dominance: right  Left mainstem: There is tapered narrowing at the ostium of 30-40%, but no high grade obstruction.  No change from the prior study.  The distal LMCA is stented into the CFX and this is patent. The stent transitions normally without significant renarrowing.    Left anterior descending (LAD): The native LAD is occluded.    The LIMA to the LAD appears patent, with perhaps minimal ostial narrowing at the takeoff of the IMA from the subclavian.  There is no drop in pressure throughout the subclavian vessel.  The distal LAD appears small in caliber, but without critical narrowing.  The vessel was non- selectively injected just outside of the ostium due to angulation.    Left circumflex (LCx): Provides a large ramus with about 30% narrowing, with the take off through the stent.  The av circ stent, crossing the OM1, leads in to the large OM2 and remains  unchanged from the time of implantation, without significant narrowing.  The OM1, which was pinched before, is unchanged and has about 50% ostial narrowing, but looks similar to the post procedure finding of 2011.    Right coronary artery (RCA): The ostium is stented and remains patent.  There is probably 30% ostial narrowing.  The RCA has diffuse 50-60% narrowing in the mid vessel, but this appears fairly smooth and largely unchanged.  The distal vessel supplies an acute marginal, PDA, and PLA, all without significant focal narrowing.  The Radial graft to the diagonal remains intact and smooth with good runoff.  Apparently, a distal limb to the OM is occluded.    Left ventriculography: Left ventricular systolic function is normal, LVEF is estimated at 50-55%, ?minimal anterolateral hypokinesis  Final Conclusions:   1.  Continued patency of the IMA to the LAD 2.  Continued patency of the radial to the diagonal 3.  Continue patency of the LMCA and CFX stents with mild narrowing of the OM1 as in 2011 4.  Continued patency of the ostial RCA stent as noted. 5.  Preserved overall LV function    Recommendations:  1.  Case reviewed with [facility].  Continue medical therapy.    Shawnie Pons 10/05/2011, 1:35 PM

## 2011-10-05 NOTE — Interval H&P Note (Signed)
History and Physical Interval Note:  10/05/2011 12:11 PM  [name] [name]  has presented today for surgery, with the diagnosis of Chest pain  The various methods of treatment have been discussed with the patient and family. After consideration of risks, benefits and other options for treatment, the patient has consented to  Procedure(s) (LRB) with comments: LEFT HEART CATHETERIZATION WITH CORONARY/GRAFT ANGIOGRAM (N/A) as a surgical intervention .  The patient's history has been reviewed, patient examined, no change in status, stable for surgery.  I have reviewed the patient's chart and labs.  Questions were answered to the patient's satisfaction.   I know the family well.  Films reviewed and plan cath.  He had complex intervention last admission.  Plan diagnostic study.  Patient agreeable.     Shawnie Pons

## 2011-10-05 NOTE — Progress Notes (Signed)
PM  Stable.  No chest pain.  Groin ok.  Plan dc in am.

## 2011-10-05 NOTE — Progress Notes (Signed)
ANTICOAGULATION CONSULT NOTE   Pharmacy Consult for Heparin Indication: chest pain/ACS  Allergies  Allergen Reactions  . Amoxicillin-Pot Clavulanate     REACTION: 'Burns' Stomach  . Codeine Nausea And Vomiting  . Hydrocodone Nausea And Vomiting  . Morphine Nausea And Vomiting  . Nitrofuran Derivatives   . Other     Mycins  . Oxycodone Hcl Nausea And Vomiting  . Shellfish Allergy Swelling  . Tramadol Nausea And Vomiting  . Zolpidem Tartrate Other (See Comments)    hallucinations    Patient Measurements: Height: 5\' 5"  (165.1 cm) Weight: 141 lb 15.6 oz (64.4 kg) IBW/kg (Calculated) : 61.5   Vital Signs: Temp: 97.9 F (36.6 C) (09/12 0800) Temp src: Oral (09/12 0800) BP: 132/74 mmHg (09/12 0730) Pulse Rate: 83  (09/12 0800)  Labs:  Basename 10/05/11 0455 10/05/11 0217 10/04/11 2316 10/04/11 2054 10/04/11 1502 10/04/11 1459 10/04/11 0932  HGB 11.2* -- -- -- -- -- 10.9*  HCT 33.8* -- -- -- -- -- 32.7*  PLT 196 -- -- -- -- -- 222  APTT -- -- -- -- -- -- --  LABPROT -- -- -- -- 14.2 -- --  INR -- -- -- -- 1.08 -- --  HEPARINUNFRC 0.39 -- 0.39 -- -- -- --  CREATININE 1.11 -- -- -- -- -- 1.18  CKTOTAL -- -- -- -- -- -- --  CKMB -- -- -- -- -- -- --  TROPONINI -- <0.30 -- <0.30 -- <0.30 --    Estimated Creatinine Clearance: 53.1 ml/min (by C-G formula based on Cr of 1.11).  Assessment: 71 yo male with chest pain who remains therapeutic on Heparin. For cardiac cath today.  Goal of Therapy:  Heparin level 0.3-0.7 units/ml Monitor platelets by anticoagulation protocol: Yes   Plan:  Continue Heparin at 900 units/hr Follow-up after cath.  Estella Husk, Pharm.D., BCPS Clinical Pharmacist  Phone [phone] Pager [phone] 10/05/2011, 9:10 AM

## 2011-10-05 NOTE — H&P (View-Only) (Signed)
@   Subjective:  "Not much" CP; no dyspnea   Objective:  Filed Vitals:   10/05/11 0021 10/05/11 0022 10/05/11 0100 10/05/11 0400  BP:    114/61  Pulse: 70 66 57 62  Temp:    97.9 F (36.6 C)  TempSrc:    Oral  Resp: 22 18 17 16   Height:      Weight:      SpO2: 96% 95% 95% 97%    Intake/Output from previous day:  Intake/Output Summary (Last 24 hours) at 10/05/11 0726 Last data filed at 10/05/11 1610  Gross per 24 hour  Intake 1817.22 ml  Output   2025 ml  Net -207.78 ml    Physical Exam: Physical exam: Well-developed well-nourished in no acute distress.  Skin is warm and dry.  HEENT is normal.  Neck is supple.  Chest is clear to auscultation with normal expansion.  Cardiovascular exam is regular rate and rhythm.  Abdominal exam nontender or distended. No masses palpated. Extremities show no edema. neuro grossly intact    Lab Results: Basic Metabolic Panel:  Basename 10/05/11 0455 10/04/11 0932  NA 142 140  K 4.2 4.6  CL 107 106  CO2 26 28  GLUCOSE 90 103*  BUN 9 12  CREATININE 1.11 1.18  CALCIUM 8.8 8.8  MG -- --  PHOS -- --   CBC:  Basename 10/05/11 0455 10/04/11 0932  WBC 4.7 4.9  NEUTROABS -- 3.3  HGB 11.2* 10.9*  HCT 33.8* 32.7*  MCV 84.3 83.4  PLT 196 222   Cardiac Enzymes:  Basename 10/05/11 0217 10/04/11 2054 10/04/11 1459  CKTOTAL -- -- --  CKMB -- -- --  CKMBINDEX -- -- --  TROPONINI <0.30 <0.30 <0.30     Assessment/Plan:  1) CP - enzymes negative; for cath today; patient has had problems with chronic CP;  patient states present symptoms are different compared to previous. CT shows no pulmonary embolus. 2) CAD - continue ASA, metoprolol 3) HTN - BP controlled; continue present meds 4) Hyperlipidemia - intoleratant to statins; continue diet 5) Chronic anemia - Heme Neg; low ferritin; fu primary care after DC. Hgb stable. 6) Recent infectious enteritis. Olga Millers 10/05/2011, 7:26 AM

## 2011-10-06 ENCOUNTER — Encounter (HOSPITAL_COMMUNITY): Payer: Self-pay | Admitting: Nurse Practitioner

## 2011-10-06 DIAGNOSIS — R079 Chest pain, unspecified: Secondary | ICD-10-CM

## 2011-10-06 DIAGNOSIS — D649 Anemia, unspecified: Secondary | ICD-10-CM

## 2011-10-06 HISTORY — DX: Anemia, unspecified: D64.9

## 2011-10-06 LAB — CBC
HCT: 33.7 % — ABNORMAL LOW (ref 39.0–52.0)
Hemoglobin: 11.1 g/dL — ABNORMAL LOW (ref 13.0–17.0)
MCH: 27.8 pg (ref 26.0–34.0)
MCHC: 32.9 g/dL (ref 30.0–36.0)
RDW: 14.7 % (ref 11.5–15.5)

## 2011-10-06 LAB — BASIC METABOLIC PANEL
BUN: 9 mg/dL (ref 6–23)
Calcium: 9 mg/dL (ref 8.4–10.5)
Creatinine, Ser: 1.19 mg/dL (ref 0.50–1.35)
GFR calc Af Amer: 69 mL/min — ABNORMAL LOW (ref >90)
GFR calc non Af Amer: 60 mL/min — ABNORMAL LOW (ref >90)
Glucose, Bld: 91 mg/dL (ref 70–99)
Potassium: 4.5 mEq/L (ref 3.5–5.1)

## 2011-10-06 NOTE — Discharge Summary (Signed)
Patient ID: [name] [name],  MRN: [id number], DOB/AGE: 02/03/1940 71 y.o.  Admit date: 10/04/2011 Discharge date: 10/06/2011  Primary Care Provider: [facility] Primary Cardiologist: B. Jens Som, MD  Discharge Diagnoses Principal Problem:  *Unstable angina  **s/p Cath this admission revealing stable anatomy ->med Rx. Active Problems:  CAD, ARTERY BYPASS GRAFT  HYPERCHOLESTEROLEMIA, PURE  HYPERTENSION, BENIGN  Anemia   Allergies Allergies  Allergen Reactions  . Amoxicillin-Pot Clavulanate     REACTION: 'Burns' Stomach  . Codeine Nausea And Vomiting  . Hydrocodone Nausea And Vomiting  . Morphine Nausea And Vomiting  . Nitrofuran Derivatives   . Other     Mycins  . Oxycodone Hcl Nausea And Vomiting  . Shellfish Allergy Swelling  . Tramadol Nausea And Vomiting  . Zolpidem Tartrate Other (See Comments)    hallucinations   Procedures  CTA of the Chest 10/04/2011  IMPRESSION: No evidence of pulmonary embolus.  No acute findings. _____________  Cardiac Catheterization 10/05/2011  Hemodynamics:  AO 146/71 (101) LV 161/25 No gradient              Coronary angiography: Coronary dominance: right  Left mainstem: There is tapered narrowing at the ostium of 30-40%, but no high grade obstruction.  No change from the prior study.  The distal LMCA is stented into the CFX and this is patent. The stent transitions normally without significant renarrowing.    Left anterior descending (LAD): The native LAD is occluded.   The LIMA to the LAD appears patent, with perhaps minimal ostial narrowing at the takeoff of the IMA from the subclavian.  There is no drop in pressure throughout the subclavian vessel.  The distal LAD appears small in caliber, but without critical narrowing.  The vessel was non- selectively injected just outside of the ostium due to angulation.    Left circumflex (LCx): Provides a large ramus with about 30% narrowing, with the take off through the stent.  The av  circ stent, crossing the OM1, leads in to the large OM2 and remains unchanged from the time of implantation, without significant narrowing.  The OM1, which was pinched before, is unchanged and has about 50% ostial narrowing, but looks similar to the post procedure finding of 2011.    Right coronary artery (RCA): The ostium is stented and remains patent.  There is probably 30% ostial narrowing.  The RCA has diffuse 50-60% narrowing in the mid vessel, but this appears fairly smooth and largely unchanged.  The distal vessel supplies an acute marginal, PDA, and PLA, all without significant focal narrowing. The Radial graft to the diagonal remains intact and smooth with good runoff.  Apparently, a distal limb to the OM is occluded.     Left ventriculography: Left ventricular systolic function is normal, LVEF is estimated at 50-55%, ?minimal anterolateral hypokinesis  Final Conclusions:   1.  Continued patency of the IMA to the LAD 2.  Continued patency of the radial to the diagonal 3.  Continue patency of the LMCA and CFX stents with mild narrowing of the OM1 as in 2011 4.  Continued patency of the ostial RCA stent as noted. 5.  Preserved overall LV function _____________  History of Present Illness  [name] [name] is a 71 y/o M with hx of LBBB, CAD s/p CABG 2001 with stenting to the LM/RCA/Cx since, carotid disease who presented to [facility] with complaints of chest pain. He was recently admitted 8/27-8/29 with abd pain felt to be infectious enteritis and was discharged  with Cipro/Flagyl and has done well since then. He has a history of chest pain, which typically occurs with morning activity but then eases off several minutes into the activity. However, over the past 4 days, he has noticed increasing substernal sharp chest discomfort each time he exerts himself that does not get better as the activity goes on.  On the day of admission, Ss progressed, prompting him to call EMS, who treated him with  SL ntg with relief.  He was taken to the [facility] ED where ECG was non-acute (chronic LBBB) and troponin was negative.  D-dimer was elevated and CTA of the chest was performed and was negative for PE.  He was admitted for further evaluation.  Hospital Course  Pt r/o for MI.  He continued to have intermittent chest pain and decision was made to pursue diagnostic catheterization.  This was performed on 9/12 and showed patent Left Main, Left Circumflex, and Right Coronary Artery stents as well as LIMA to LAD and Radial Artery to Diagonal grafts.  Continued medical therapy is warranted.  Pt has been ambulating this AM.  He continues to complain of "a little" chest pain.  He will be discharged home today in good condition.  We have recommended primary care follow-up for both non-cardiac chest pain and chronic guaiac negative, normocytic anemia.  Discharge Vitals Blood pressure 114/64, pulse 67, temperature 97.5 F (36.4 C), temperature source Oral, resp. rate 18, height 5\' 4"  (1.626 m), weight 141 lb 15.6 oz (64.4 kg), SpO2 97.00%.  Filed Weights   10/04/11 0959 10/04/11 1500 10/05/11 0020  Weight: 146 lb 9.7 oz (66.5 kg) 144 lb 2.9 oz (65.4 kg) 141 lb 15.6 oz (64.4 kg)   Labs  CBC  Basename 10/06/11 0530 10/05/11 1458 10/04/11 0932  WBC 4.9 4.1 --  NEUTROABS -- -- 3.3  HGB 11.1* 12.0* --  HCT 33.7* 35.6* --  MCV 84.5 83.6 --  PLT 194 193 --   Basic Metabolic Panel  Basename 10/06/11 0530 10/05/11 1458 10/05/11 0455  NA 145 -- 142  K 4.5 -- 4.2  CL 108 -- 107  CO2 29 -- 26  GLUCOSE 91 -- 90  BUN 9 -- 9  CREATININE 1.19 1.04 --  CALCIUM 9.0 -- 8.8  MG -- -- --  PHOS -- -- --   Liver Function Tests  Basename 10/05/11 0455  AST 15  ALT 14  ALKPHOS 44  BILITOT 0.9  PROT 6.1  ALBUMIN 3.4*   Cardiac Enzymes  Basename 10/05/11 0217 10/04/11 2054 10/04/11 1459  CKTOTAL -- -- --  CKMB -- -- --  CKMBINDEX -- -- --  TROPONINI <0.30 <0.30 <0.30   D-Dimer  Basename 10/05/11 1307  10/04/11 1044  DDIMER 0.50* 0.82*   Fasting Lipid Panel  Basename 10/05/11 0455  CHOL 225*  HDL 39*  LDLCALC 154*  TRIG 161*  CHOLHDL 5.8  LDLDIRECT --   Disposition  Pt is being discharged home today in good condition.  Follow-up Plans & Appointments  Follow-up Information    Follow up with [facility], MD. (1-2 wks)    Contact information:   Dr. Burnell Blanks [address] [place] [address] [phone]       Follow up with Olga Millers, MD. On 01/05/2012. (12:30 PM)    Contact information:   [address] [place] [address] [phone]        Discharge Medications    Medication List     As of  10/06/2011 10:52 AM    TAKE these medications         amLODipine 5 MG tablet   Commonly known as: NORVASC   Take 2.5 mg by mouth 2 (two) times daily.      aspirin EC 81 MG tablet   Take 81 mg by mouth at bedtime.      clopidogrel 75 MG tablet   Commonly known as: PLAVIX   Take 1 tablet (75 mg total) by mouth daily.      diphenhydrAMINE 25 MG tablet   Commonly known as: BENADRYL   Take 25 mg by mouth at bedtime. sleep      enalapril 20 MG tablet   Commonly known as: VASOTEC   Take 20 mg by mouth 2 (two) times daily.      isosorbide mononitrate 60 MG 24 hr tablet   Commonly known as: IMDUR   Take 30 mg by mouth 2 (two) times daily.      metoCLOPramide 10 MG tablet   Commonly known as: REGLAN   Take 5 mg by mouth 4 (four) times daily as needed. Stomach pain      metoprolol 50 MG tablet   Commonly known as: LOPRESSOR   Take 75 mg by mouth 2 (two) times daily.      nitroGLYCERIN 0.4 MG SL tablet   Commonly known as: NITROSTAT   Place 0.4 mg under the tongue every 5 (five) minutes as needed. For chest pain      pantoprazole 40 MG tablet   Commonly known as: PROTONIX   Take 40 mg by mouth 2 (two) times daily.      Tamsulosin HCl 0.4 MG Caps   Commonly known as: FLOMAX   Take 1 capsule (0.4 mg total) by mouth  daily after supper.      Outstanding Labs/Studies  None Duration of Discharge Encounter   Greater than 30 minutes including physician time.  Signed, Nicolasa Ducking NP 10/06/2011, 10:52 AM

## 2011-10-06 NOTE — Discharge Summary (Signed)
See progress notes Brian Crenshaw  

## 2011-10-06 NOTE — Progress Notes (Signed)
@   Subjective:  "A little" CP; no dyspnea   Objective:  Filed Vitals:   10/05/11 1645 10/05/11 1745 10/05/11 2041 10/06/11 0500  BP: 120/47 124/61 129/60 114/64  Pulse: 67 67 71 67  Temp:   97.5 F (36.4 C) 97.5 F (36.4 C)  TempSrc:   Oral Oral  Resp:   18   Height:      Weight:      SpO2:   97% 97%    Intake/Output from previous day:  Intake/Output Summary (Last 24 hours) at 10/06/11 0741 Last data filed at 10/05/11 2055  Gross per 24 hour  Intake    762 ml  Output   1176 ml  Net   -414 ml    Physical Exam: Physical exam: Well-developed well-nourished in no acute distress.  Skin is warm and dry.  HEENT is normal.  Neck is supple.  Chest is clear to auscultation with normal expansion.  Cardiovascular exam is regular rate and rhythm.  Abdominal exam nontender or distended. No masses palpated. Right groin with no hematoma and no bruit Extremities show no edema. neuro grossly intact    Lab Results: Basic Metabolic Panel:  Basename 10/06/11 0530 10/05/11 1458 10/05/11 0455  NA 145 -- 142  K 4.5 -- 4.2  CL 108 -- 107  CO2 29 -- 26  GLUCOSE 91 -- 90  BUN 9 -- 9  CREATININE 1.19 1.04 --  CALCIUM 9.0 -- 8.8  MG -- -- --  PHOS -- -- --   CBC:  Basename 10/06/11 0530 10/05/11 1458 10/04/11 0932  WBC 4.9 4.1 --  NEUTROABS -- -- 3.3  HGB 11.1* 12.0* --  HCT 33.7* 35.6* --  MCV 84.5 83.6 --  PLT 194 193 --   Cardiac Enzymes:  Basename 10/05/11 0217 10/04/11 2054 10/04/11 1459  CKTOTAL -- -- --  CKMB -- -- --  CKMBINDEX -- -- --  TROPONINI <0.30 <0.30 <0.30     Assessment/Plan:  1) CP - enzymes negative; cath reveals patent grafts; patient has had problems with chronic CP; CT shows no pulmonary embolus. 2) CAD - continue ASA, metoprolol 3) HTN - BP controlled; continue present meds 4) Hyperlipidemia - intoleratant to statins; continue diet 5) Chronic anemia - Heme Neg; low ferritin; fu primary care after DC. Hgb stable. 6) Recent infectious  enteritis. DC today on preadmission meds and fu with primary care for anemia; fu with me 3 months >30 min PA and physician time D2 Olga Millers 10/06/2011, 7:41 AM

## 2011-10-18 ENCOUNTER — Emergency Department (HOSPITAL_COMMUNITY)
Admission: EM | Admit: 2011-10-18 | Discharge: 2011-10-18 | Disposition: A | Discharge status: Home Health | Payer: Medicare Other | Attending: Emergency Medicine | Admitting: Emergency Medicine

## 2011-10-18 ENCOUNTER — Encounter (HOSPITAL_COMMUNITY): Payer: Self-pay | Admitting: Emergency Medicine

## 2011-10-18 ENCOUNTER — Emergency Department (HOSPITAL_COMMUNITY): Payer: Medicare Other

## 2011-10-18 DIAGNOSIS — Z7982 Long term (current) use of aspirin: Secondary | ICD-10-CM | POA: Insufficient documentation

## 2011-10-18 DIAGNOSIS — Z9089 Acquired absence of other organs: Secondary | ICD-10-CM | POA: Insufficient documentation

## 2011-10-18 DIAGNOSIS — R1032 Left lower quadrant pain: Secondary | ICD-10-CM | POA: Diagnosis not present

## 2011-10-18 DIAGNOSIS — I10 Essential (primary) hypertension: Secondary | ICD-10-CM | POA: Insufficient documentation

## 2011-10-18 DIAGNOSIS — Z79899 Other long term (current) drug therapy: Secondary | ICD-10-CM | POA: Diagnosis not present

## 2011-10-18 DIAGNOSIS — I2510 Atherosclerotic heart disease of native coronary artery without angina pectoris: Secondary | ICD-10-CM | POA: Diagnosis not present

## 2011-10-18 DIAGNOSIS — K5289 Other specified noninfective gastroenteritis and colitis: Secondary | ICD-10-CM | POA: Insufficient documentation

## 2011-10-18 DIAGNOSIS — G8929 Other chronic pain: Secondary | ICD-10-CM | POA: Insufficient documentation

## 2011-10-18 DIAGNOSIS — K529 Noninfective gastroenteritis and colitis, unspecified: Secondary | ICD-10-CM

## 2011-10-18 LAB — COMPREHENSIVE METABOLIC PANEL
Albumin: 3.3 g/dL — ABNORMAL LOW (ref 3.5–5.2)
Alkaline Phosphatase: 59 U/L (ref 39–117)
BUN: 18 mg/dL (ref 6–23)
Calcium: 8.9 mg/dL (ref 8.4–10.5)
Creatinine, Ser: 1.13 mg/dL (ref 0.50–1.35)
GFR calc Af Amer: 74 mL/min — ABNORMAL LOW (ref >90)
Glucose, Bld: 117 mg/dL — ABNORMAL HIGH (ref 70–99)
Potassium: 4 mEq/L (ref 3.5–5.1)
Total Protein: 6.4 g/dL (ref 6.0–8.3)

## 2011-10-18 LAB — URINALYSIS, ROUTINE W REFLEX MICROSCOPIC
Bilirubin Urine: NEGATIVE
Hgb urine dipstick: NEGATIVE
Ketones, ur: NEGATIVE mg/dL
Nitrite: NEGATIVE
Protein, ur: NEGATIVE mg/dL
Urobilinogen, UA: 0.2 mg/dL (ref 0.0–1.0)

## 2011-10-18 LAB — CBC WITH DIFFERENTIAL/PLATELET
Basophils Absolute: 0 10*3/uL (ref 0.0–0.1)
HCT: 38.4 % — ABNORMAL LOW (ref 39.0–52.0)
Lymphocytes Relative: 14 % (ref 12–46)
Lymphs Abs: 1.1 10*3/uL (ref 0.7–4.0)
Monocytes Absolute: 0.4 10*3/uL (ref 0.1–1.0)
Neutro Abs: 6.3 10*3/uL (ref 1.7–7.7)
RBC: 4.53 MIL/uL (ref 4.22–5.81)
RDW: 14.1 % (ref 11.5–15.5)
WBC: 7.9 10*3/uL (ref 4.0–10.5)

## 2011-10-18 MED ORDER — IOHEXOL 300 MG/ML  SOLN
20.0000 mL | INTRAMUSCULAR | Status: AC
  Administered 2011-10-18 (×2): 20 mL via ORAL

## 2011-10-18 MED ORDER — HYDROMORPHONE HCL PF 1 MG/ML IJ SOLN
0.5000 mg | Freq: Once | INTRAMUSCULAR | Status: AC
  Administered 2011-10-18: 0.5 mg via INTRAVENOUS
  Filled 2011-10-18: qty 1

## 2011-10-18 MED ORDER — MORPHINE SULFATE 4 MG/ML IJ SOLN
2.0000 mg | Freq: Once | INTRAMUSCULAR | Status: DC
  Filled 2011-10-18: qty 1

## 2011-10-18 MED ORDER — ACETAMINOPHEN 325 MG PO TABS
650.0000 mg | ORAL_TABLET | Freq: Once | ORAL | Status: AC
  Administered 2011-10-18: 650 mg via ORAL
  Filled 2011-10-18: qty 2

## 2011-10-18 MED ORDER — DIPHENHYDRAMINE HCL 50 MG/ML IJ SOLN
25.0000 mg | Freq: Once | INTRAMUSCULAR | Status: AC
  Administered 2011-10-18: 25 mg via INTRAVENOUS
  Filled 2011-10-18: qty 1

## 2011-10-18 MED ORDER — SODIUM CHLORIDE 0.9 % IV BOLUS (SEPSIS)
500.0000 mL | Freq: Once | INTRAVENOUS | Status: AC
  Administered 2011-10-18: 500 mL via INTRAVENOUS

## 2011-10-18 MED ORDER — IOHEXOL 300 MG/ML  SOLN
80.0000 mL | Freq: Once | INTRAMUSCULAR | Status: AC | PRN
  Administered 2011-10-18: 80 mL via INTRAVENOUS

## 2011-10-18 MED ORDER — ONDANSETRON HCL 4 MG/2ML IJ SOLN
4.0000 mg | Freq: Once | INTRAMUSCULAR | Status: AC
  Administered 2011-10-18: 4 mg via INTRAVENOUS
  Filled 2011-10-18: qty 2

## 2011-10-18 NOTE — ED Provider Notes (Signed)
Pt with recurrence of lower L abd pain - recently seen and treated / admitted for enteritis, then a CP admission - has no Chest complaints today and no f/c/n/v.  He has focal LLQ pain on exam with mild guarding.  At change of shift - care signed out to Dr. Effie Shy pending CT to r/o diverticulitis / abscess.  Unlikely perforation given exam, VS and lab w/u which has been benign thus far.  Medical screening examination/treatment/procedure(s) were conducted as a shared visit with non-physician practitioner(s) and myself.  I personally evaluated the patient during the encounter    Vida Roller, MD 10/18/11 [phone]

## 2011-10-18 NOTE — ED Notes (Signed)
Oral contrast completed, CT notified

## 2011-10-18 NOTE — ED Provider Notes (Signed)
History     CSN: [id number]  Arrival date & time 10/18/11  4098   First MD Initiated Contact with Patient 10/18/11 0340      Chief Complaint  Patient presents with  . Abdominal Pain    (Consider location/radiation/quality/duration/timing/severity/associated sxs/prior treatment) HPI Comments: This is a 71 year old male who presents to the emergency department tonight with abdominal pain. He states that at around 8 PM tonight the pain became worse. He does not associated with activity or with eating food. He describes the pain as sharp and diffuse throughout all quadrants. He also complains of dysuria. He states that he has maintained regular bowel movements, though he has not had a bowel movement today. He states that his pain is about a 9/10.  The history is provided by the patient. No language interpreter was used.    Past Medical History  Diagnosis Date  . Hypertension   . Prostate cancer     s/p cryoablation  . Pancreatitis Dec 2011    ERCP ok.  . Hiatal hernia   . Coronary artery disease     a. S/p CABG in 2001. b. S/p DES to protected LM and BMS to RCA 2006. c. 12/2009: had CP with negative stress test with repeat cath s/p DES to Cx;  d. 09/2011 Cath: patent  RCA, LM, LCX stents, patent LIMA->LAD & Radial->Diag,EF 50-55%-->Med Rx.  . Chronic chest pain   . Left bundle branch block   . GERD (gastroesophageal reflux disease)   . Hyperlipidemia   . Inguinal hernia   . Statin intolerance   . Anxiety disorder   . Peripheral vascular disease 04/10/11    a. s/p left carotid endarterectomy 04/14/2011.  Marland Kitchen Esophageal stricture   . Renal cyst     Seen on CT 08/2011 also with circumferential bladder wall thickening  . Anginal pain   . Anemia 10/06/2011    Past Surgical History  Procedure Date  . Heart stents   . Cholecystectomy   . Inguinal hernia repair     bilateral  . Coronary artery bypass graft 2001  . Endarterectomy 04/14/2011    Procedure: ENDARTERECTOMY CAROTID;   Surgeon: Pryor Ochoa, MD;  Location: [facility] OR;  Service: Vascular;  Laterality: Left;  Would like to perform procedure first, at 0730  . Cardiac catheterization   . Coronary angioplasty     Family History  Problem Relation Age of Onset  . Heart disease Mother     Heart Disease before age 37  . Hypertension Mother   . Cancer Father   . Hypertension Sister   . Heart disease Sister     Heart Disease before age 28  . Hypertension Brother   . Heart disease Brother     Heart Disease before age 105  . Heart attack Brother     History  Substance Use Topics  . Smoking status: Never Smoker   . Smokeless tobacco: Current User    Types: Chew  . Alcohol Use: No      Review of Systems  Respiratory: Negative for shortness of breath.   Cardiovascular: Negative for chest pain.  Gastrointestinal: Positive for nausea, abdominal pain and abdominal distention. Negative for vomiting, diarrhea, constipation and blood in stool.  Neurological: Negative for dizziness and weakness.  All other systems reviewed and are negative.    Allergies  Amoxicillin-pot clavulanate; Codeine; Hydrocodone; Morphine; Nitrofuran derivatives; Other; Oxycodone hcl; Shellfish allergy; Tramadol; and Zolpidem tartrate  Home Medications   Current Outpatient Rx  Name  Route Sig Dispense Refill  . AMLODIPINE BESYLATE 5 MG PO TABS Oral Take 2.5 mg by mouth 2 (two) times daily.    . ASPIRIN EC 81 MG PO TBEC Oral Take 81 mg by mouth at bedtime.     . CLOPIDOGREL BISULFATE 75 MG PO TABS Oral Take 1 tablet (75 mg total) by mouth daily. 30 tablet 11  . DIPHENHYDRAMINE HCL 25 MG PO TABS Oral Take 25 mg by mouth at bedtime. sleep    . ENALAPRIL MALEATE 20 MG PO TABS Oral Take 20 mg by mouth 2 (two) times daily.    . ISOSORBIDE MONONITRATE ER 60 MG PO TB24 Oral Take 30 mg by mouth 2 (two) times daily.    Marland Kitchen METOCLOPRAMIDE HCL 10 MG PO TABS Oral Take 5 mg by mouth 4 (four) times daily as needed. Stomach pain    . METOPROLOL  TARTRATE 50 MG PO TABS Oral Take 75 mg by mouth 2 (two) times daily.    Marland Kitchen NITROGLYCERIN 0.4 MG SL SUBL Sublingual Place 0.4 mg under the tongue every 5 (five) minutes as needed. For chest pain    . PANTOPRAZOLE SODIUM 40 MG PO TBEC Oral Take 40 mg by mouth 2 (two) times daily.     Marland Kitchen TAMSULOSIN HCL 0.4 MG PO CAPS Oral Take 1 capsule (0.4 mg total) by mouth daily after supper. 30 capsule 6    BP 121/59  Pulse 82  Temp 97.8 F (36.6 C) (Oral)  Resp 14  SpO2 98%  Physical Exam  Nursing note and vitals reviewed. Constitutional: He is oriented to person, place, and time. He appears well-developed and well-nourished.  HENT:  Head: Normocephalic and atraumatic.  Eyes: Conjunctivae normal and EOM are normal. Pupils are equal, round, and reactive to light.  Neck: Normal range of motion. Neck supple.  Cardiovascular: Normal rate, regular rhythm and normal heart sounds.   Pulmonary/Chest: Effort normal and breath sounds normal.  Abdominal: Bowel sounds are normal. He exhibits distension. There is tenderness. There is guarding.  Musculoskeletal: Normal range of motion.  Neurological: He is alert and oriented to person, place, and time.  Skin: Skin is warm and dry.  Psychiatric: He has a normal mood and affect. His behavior is normal. Judgment and thought content normal.    ED Course  Procedures (including critical care time)   Labs Reviewed  URINALYSIS, ROUTINE W REFLEX MICROSCOPIC   No results found.   No diagnosis found.    MDM  71 year old male with abdominal pain. Physical exam more indicative of diverticulitis versus pyelonephritis.  UA is negative as detailed above. Awaiting CT abdomen rule out diverticulitis.  This patient has been discussed with Dr. Hyacinth Meeker, who has helped in developing the treatment plan.        Roxy Horseman, PA-C 10/18/11 0710

## 2011-10-18 NOTE — ED Provider Notes (Signed)
KAIROS PANETTA is a 71 y.o. male who is being evaluated for abdominal pain. Receives checkout from Dr. Hyacinth Meeker. He ordered a CT scan to evaluate for the possibility of diverticulitis. Patient had a similar admission to the hospital in August 2013 CT scan was done. That scan is similar to the one reported today. The patient has nonspecific enteritis. He has marked peripheral vascular disease. There was no gross obstruction of arterial flow to the bowels today. However, he could be having small vessel disease that is not visualized on a non-angio CT. Head. Last had GI endoscopy in 2010/2011. No particular problems were noted at that time.  His repeat vitals are normal. Pt is comfortable. I discussed the case with the GI PA Sanjuana Letters), we set up OP f/u with the GI OP service on 10/24/11. PT will return prn   Flint Melter, MD 10/21/11 1133

## 2011-10-18 NOTE — ED Notes (Signed)
PT. REPORTS MID ABDOMINAL PAIN WITH NAUSEA ONSET LAST NIGHT , DENIES VOMITTING OR DIARRHEA, NO FEVER OR CHILLS , NO DYSURIA.

## 2011-10-18 NOTE — Discharge Instructions (Signed)
Use Tylenol for pain.  Try to eat regular meals 3 times a day.  Return here if needed for problems.

## 2011-10-18 NOTE — ED Notes (Signed)
Abd taunt to touch, decreased bowel sounds.  Last BM was Tuesday morning and he stated it was normal except he went 4 times.

## 2011-10-18 NOTE — ED Notes (Signed)
Patient transported to CT 

## 2011-10-24 ENCOUNTER — Ambulatory Visit (INDEPENDENT_AMBULATORY_CARE_PROVIDER_SITE_OTHER): Payer: Medicare Other | Admitting: Internal Medicine

## 2011-10-24 ENCOUNTER — Encounter: Payer: Self-pay | Admitting: Internal Medicine

## 2011-10-24 ENCOUNTER — Other Ambulatory Visit (INDEPENDENT_AMBULATORY_CARE_PROVIDER_SITE_OTHER): Payer: Medicare Other

## 2011-10-24 VITALS — BP 134/76 | HR 72 | Ht 64.0 in | Wt 143.6 lb

## 2011-10-24 DIAGNOSIS — K219 Gastro-esophageal reflux disease without esophagitis: Secondary | ICD-10-CM

## 2011-10-24 DIAGNOSIS — K5289 Other specified noninfective gastroenteritis and colitis: Secondary | ICD-10-CM

## 2011-10-24 DIAGNOSIS — D509 Iron deficiency anemia, unspecified: Secondary | ICD-10-CM

## 2011-10-24 DIAGNOSIS — K529 Noninfective gastroenteritis and colitis, unspecified: Secondary | ICD-10-CM

## 2011-10-24 DIAGNOSIS — R933 Abnormal findings on diagnostic imaging of other parts of digestive tract: Secondary | ICD-10-CM

## 2011-10-24 DIAGNOSIS — R1084 Generalized abdominal pain: Secondary | ICD-10-CM

## 2011-10-24 DIAGNOSIS — Z23 Encounter for immunization: Secondary | ICD-10-CM | POA: Diagnosis not present

## 2011-10-24 LAB — IGA: IgA: 198 mg/dL (ref 68–378)

## 2011-10-24 NOTE — Patient Instructions (Addendum)
Your physician has requested that you go to the basement for lab work before leaving today  Please see Dr. Nathanial Rancher about adjusting your blood pressure medicine  Please follow up with Dr. Marina Goodell in 4 weeks

## 2011-10-24 NOTE — Progress Notes (Signed)
HISTORY OF PRESENT ILLNESS:  [name] [name] is a 71 y.o. male with multiple significant medical problems as listed below. I have not seen the patient since October 2010 when he was evaluated for dysphagia, epigastric pain, and bloating. He is status post cholecystectomy. He presents today regarding recurrent problems with abdominal pain that have resulted in emergency room visits. As well, abnormal radiology. Patient underwent colonoscopy and upper endoscopy in November of 2010. Colonoscopy revealed a diminutive transverse colon adenoma which was removed as well as moderate left-sided diverticulosis and internal hemorrhoids. Upper endoscopy revealed a distal esophageal ring but was otherwise normal. Was dilated with a 54 [place] Maloney dilator. He has continued on PPI. He apparently had an episode of pancreatitis in December of 2011 for which she underwent ERCP with Dr. Arlyce Dice. This was normal. He was in his usual state of health until about 6 weeks ago when he developed severe diffuse abdominal pain associated with nausea but no vomiting. He went to the emergency room. Laboratories were unrevealing. Normal white blood cell count. Mild anemia. Normal liver tests and chemistries. However, CT scan of the abdomen and pelvis obtained 09-19-11 revealed a long segment of thickened small bowel associated with mucosal enhancement. As well, small amounts of ascites. He was felt to have some type of enteritis (infectious versus ischemic versus inflammatory). I reviewed the CT scans with the radiologist, Dr. Reche Dixon. He was treated symptomatically and sent home. However, recurrent episode of the same pain last week. Repeat laboratories were unchanged. Repeat CT scan 10-18-11 revealed multiple thickened loops of small bowel with Marina Goodell enteric fat stranding and intraperitoneal fluid. Changes were somewhat improved from previous exam though still significantly abnormal. He was again treated symptomatically and send home with  instructions to be seen in this office. Patient is a fair historian. He is illiterate. He is accompanied by his wife. He still reports some abdominal discomfort today, though much less. He is moving his bowels. No diarrhea. No bleeding. His weight has been stable. Appetite is stable. Patient denies any new medications, though states his blood pressure pill was increased. He is not sure which Bill. He does stay on PPI for GERD. No active GERD symptoms. No dysphagia.   REVIEW OF SYSTEMS:  All non-GI ROS negative except for back pain, muscle cramps, excessive urination  Past Medical History  Diagnosis Date  . Hypertension   . Prostate cancer     s/p cryoablation  . Pancreatitis Dec 2011    ERCP ok.  . Hiatal hernia   . Coronary artery disease     a. S/p CABG in 2001. b. S/p DES to protected LM and BMS to RCA 2006. c. 12/2009: had CP with negative stress test with repeat cath s/p DES to Cx;  d. 09/2011 Cath: patent  RCA, LM, LCX stents, patent LIMA->LAD & Radial->Diag,EF 50-55%-->Med Rx.  . Chronic chest pain   . Left bundle branch block   . GERD (gastroesophageal reflux disease)   . Hyperlipidemia   . Inguinal hernia   . Statin intolerance   . Anxiety disorder   . Peripheral vascular disease 04/10/11    a. s/p left carotid endarterectomy 04/14/2011.  Marland Kitchen Esophageal stricture   . Renal cyst     Seen on CT 08/2011 also with circumferential bladder wall thickening  . Anginal pain   . Anemia 10/06/2011  . Colon polyp   . Hemorrhoids   . Diverticulosis     Past Surgical History  Procedure Date  . Heart  stents   . Cholecystectomy   . Inguinal hernia repair     bilateral  . Coronary artery bypass graft 2001  . Endarterectomy 04/14/2011    Procedure: ENDARTERECTOMY CAROTID;  Surgeon: Pryor Ochoa, MD;  Location: [facility] OR;  Service: Vascular;  Laterality: Left;  Would like to perform procedure first, at 0730  . Cardiac catheterization   . Coronary angioplasty     Social History [name] [name]  reports that he has never smoked. His smokeless tobacco use includes Chew. He reports that he does not drink alcohol or use illicit drugs.  family history includes Cancer in his father; Heart attack in his brother; Heart disease in his brother, mother, and sister; and Hypertension in his brother, mother, and sister.  Allergies  Allergen Reactions  . Amoxicillin-Pot Clavulanate     REACTION: 'Burns' Stomach  . Codeine Nausea And Vomiting  . Erythromycin Other (See Comments)    All mycins cause upset stomach  . Hydrocodone Nausea And Vomiting  . Morphine Nausea And Vomiting  . Nitrofuran Derivatives Other (See Comments)    unknown  . Oxycodone Hcl Nausea And Vomiting  . Shellfish Allergy Swelling  . Tramadol Nausea And Vomiting  . Zolpidem Tartrate Other (See Comments)    hallucinations       PHYSICAL EXAMINATION: Vital signs: BP 134/76  Pulse 72  Ht 5\' 4"  (1.626 m)  Wt 143 lb 9.6 oz (65.137 kg)  BMI 24.65 kg/m2  Constitutional: generally well-appearing, no acute distress Psychiatric: alert and oriented x3, cooperative Eyes: extraocular movements intact, anicteric, conjunctiva pink Mouth: oral pharynx moist, no lesions Neck: supple no lymphadenopathy Cardiovascular: heart regular rate and rhythm, no murmur Lungs: clear to auscultation bilaterally Abdomen: soft, nontender, nondistended, no obvious ascites, no peritoneal signs, normal bowel sounds, no organomegaly Rectal:omitted Extremities: no lower extremity edema bilaterally Skin: no lesions on visible extremities Neuro: No focal deficits. No asterixis.     ASSESSMENT:  #1. Recurrent episodes of severe abdominal pain. CT imaging demonstrates thickening or inflammation of the mid small bowel. At this point, I doubt infectious etiology. As well, I doubt ischemia based on the appearance of the mesenteric vasculature on CT. The appearance is also a little atypical for Crohn's. I am somewhat suspicious that he may  have ACE inhibitor induced angioedema. I discussed this possibility with the patient, his wife,and Dr. Nathanial Rancher (the patient's primary care provider). Other possibilities include eosinophilic gastroenteritis or inflammatory jejunitis seen with celiac disease #2. Mild anemia with low ferritin suggesting iron deficiency #3. GERD with peptic stricture status post dilation. Asymptomatic on PPI #4. Colonoscopy 2010 with diminutive adenoma #5. General medical problems #6. Status post cholecystectomy   PLAN:  #1. C1 esterase inhibitor level #2. Celiac panel and serum IgA level #3. Dr. Nathanial Rancher to discontinue ACE inhibitor and manage blood pressure accordingly #4. GI followup in 4 weeks. Contact the office in the interim for recurrent problems with pain

## 2011-10-25 LAB — GLIA (IGA/G) + TTG IGA: Tissue Transglutaminase Ab, IgA: 6.2 U/mL (ref <20)

## 2011-10-26 ENCOUNTER — Telehealth: Payer: Self-pay | Admitting: Internal Medicine

## 2011-10-26 NOTE — Telephone Encounter (Signed)
Spoke with pts wife and let her know it was the Vasotec, (Ace inhibitor) that Dr. Marina Goodell wanted the pt to stop with the help of Dr. Nathanial Rancher. Wife verbalized understanding.

## 2011-10-30 ENCOUNTER — Encounter: Payer: Self-pay | Admitting: Vascular Surgery

## 2011-10-31 ENCOUNTER — Ambulatory Visit: Payer: Medicare Other | Admitting: Vascular Surgery

## 2011-10-31 ENCOUNTER — Other Ambulatory Visit: Payer: Medicare Other

## 2011-11-01 ENCOUNTER — Other Ambulatory Visit: Payer: Self-pay | Admitting: Cardiology

## 2011-11-27 ENCOUNTER — Encounter: Payer: Self-pay | Admitting: Neurosurgery

## 2011-11-28 ENCOUNTER — Other Ambulatory Visit (INDEPENDENT_AMBULATORY_CARE_PROVIDER_SITE_OTHER): Payer: Medicare Other | Admitting: *Deleted

## 2011-11-28 ENCOUNTER — Encounter: Payer: Self-pay | Admitting: Neurosurgery

## 2011-11-28 ENCOUNTER — Ambulatory Visit (INDEPENDENT_AMBULATORY_CARE_PROVIDER_SITE_OTHER): Payer: Medicare Other | Admitting: Neurosurgery

## 2011-11-28 VITALS — BP 154/75 | HR 66 | Resp 16 | Ht 64.5 in | Wt 138.0 lb

## 2011-11-28 DIAGNOSIS — Z48812 Encounter for surgical aftercare following surgery on the circulatory system: Secondary | ICD-10-CM | POA: Diagnosis not present

## 2011-11-28 DIAGNOSIS — I6529 Occlusion and stenosis of unspecified carotid artery: Secondary | ICD-10-CM | POA: Diagnosis not present

## 2011-11-28 NOTE — Progress Notes (Signed)
[facility] Carotid Office Note  CC: Carotid surveillance Referring Physician: Hart Rochester  History of Present Illness:  -year-old male patient of Dr. Hart Rochester followed status post left CEA in March 2013. The patient denies any signs or symptoms of CVA, TIA, amaurosis fugax or any neural deficit. The patient denies any new medical diagnoses recent surgery.  Past Medical History  Diagnosis Date  . Hypertension   . Prostate cancer     s/p cryoablation  . Pancreatitis Dec 2011    ERCP ok.  . Hiatal hernia   . Coronary artery disease     a. S/p CABG in 2001. b. S/p DES to protected LM and BMS to RCA 2006. c. 12/2009: had CP with negative stress test with repeat cath s/p DES to Cx;  d. 09/2011 Cath: patent  RCA, LM, LCX stents, patent LIMA->LAD & Radial->Diag,EF 50-55%-->Med Rx.  . Chronic chest pain   . Left bundle branch block   . GERD (gastroesophageal reflux disease)   . Hyperlipidemia   . Inguinal hernia   . Statin intolerance   . Anxiety disorder   . Peripheral vascular disease 04/10/11    a. s/p left carotid endarterectomy 04/14/2011.  Marland Kitchen Esophageal stricture   . Renal cyst     Seen on CT 08/2011 also with circumferential bladder wall thickening  . Anginal pain   . Anemia 10/06/2011  . Colon polyp   . Hemorrhoids   . Diverticulosis   . Atrial fibrillation     ROS: [x]  Positive   [ ]  Denies    General: [ ]  Weight loss, [ ]  Fever, [ ]  chills Neurologic: [ ]  Dizziness, [ ]  Blackouts, [ ]  Seizure [ ]  Stroke, [ ]  "Mini stroke", [ ]  Slurred speech, [ ]  Temporary blindness; [ ]  weakness in arms or legs, [ ]  Hoarseness Cardiac: [ ]  Chest pain/pressure, [ ]  Shortness of breath at rest [ ]  Shortness of breath with exertion, [ ]  Atrial fibrillation or irregular heartbeat Vascular: [ ]  Pain in legs with walking, [ ]  Pain in legs at rest, [ ]  Pain in legs at night,  [ ]  Non-healing ulcer, [ ]  Blood clot in vein/DVT,   Pulmonary: [ ]  Home oxygen, [ ]  Productive  cough, [ ]  Coughing up blood, [ ]  Asthma,  [ ]  Wheezing Musculoskeletal:  [ ]  Arthritis, [ ]  Low back pain, [ ]  Joint pain Hematologic: [ ]  Easy Bruising, [ ]  Anemia; [ ]  Hepatitis Gastrointestinal: [ ]  Blood in stool, [ ]  Gastroesophageal Reflux/heartburn, [ ]  Trouble swallowing Urinary: [ ]  chronic Kidney disease, [ ]  on HD - [ ]  MWF or [ ]  TTHS, [ ]  Burning with urination, [ ]  Difficulty urinating Skin: [ ]  Rashes, [ ]  Wounds Psychological: [ ]  Anxiety, [ ]  Depression   Social History History  Substance Use Topics  . Smoking status: Never Smoker   . Smokeless tobacco: Current User    Types: Chew  . Alcohol Use: No    Family History Family History  Problem Relation Age of Onset  . Heart disease Mother     Heart Disease before age 40  . Hypertension Mother   . Heart attack Mother   . Cancer Father   . Hypertension Sister   . Heart disease Sister     Heart Disease before age 54  . Cancer Sister   . Heart attack Sister   . Hypertension Brother   . Heart disease Brother     Heart  Disease before age 35  . Heart attack Brother     Allergies  Allergen Reactions  . Amoxicillin-Pot Clavulanate     REACTION: 'Burns' Stomach  . Codeine Nausea And Vomiting  . Erythromycin Other (See Comments)    All mycins cause upset stomach  . Hydrocodone Nausea And Vomiting  . Morphine Nausea And Vomiting  . Nitrofuran Derivatives Other (See Comments)    unknown  . Oxycodone Hcl Nausea And Vomiting  . Shellfish Allergy Swelling  . Tramadol Nausea And Vomiting  . Zolpidem Tartrate Other (See Comments)    hallucinations    Current Outpatient Prescriptions  Medication Sig Dispense Refill  . amLODipine (NORVASC) 5 MG tablet Take 2.5 mg by mouth 2 (two) times daily.      Marland Kitchen aspirin EC 81 MG tablet Take 81 mg by mouth at bedtime.       . diphenhydrAMINE (BENADRYL) 25 MG tablet Take 25 mg by mouth at bedtime. sleep      . isosorbide mononitrate (IMDUR) 60 MG 24 hr tablet Take 30 mg by  mouth 2 (two) times daily.      . metoCLOPramide (REGLAN) 10 MG tablet Take 5 mg by mouth 4 (four) times daily as needed. Stomach pain      . metoprolol (LOPRESSOR) 50 MG tablet Take 75 mg by mouth 2 (two) times daily.      . nitroGLYCERIN (NITROSTAT) 0.4 MG SL tablet Place 0.4 mg under the tongue every 5 (five) minutes as needed. For chest pain      . Omega-3 Fatty Acids (FISH OIL) 1000 MG CAPS Take 1 capsule by mouth 2 (two) times daily.      . pantoprazole (PROTONIX) 40 MG tablet Take 40 mg by mouth 2 (two) times daily.       . Tamsulosin HCl (FLOMAX) 0.4 MG CAPS Take 1 capsule (0.4 mg total) by mouth daily after supper.  30 capsule  6  . clopidogrel (PLAVIX) 75 MG tablet Take 1 tablet (75 mg total) by mouth daily.  30 tablet  11  . enalapril (VASOTEC) 20 MG tablet Take 20 mg by mouth 2 (two) times daily.      . enalapril (VASOTEC) 20 MG tablet TAKE 1 TABLET BY MOUTH TWICE A DAY  60 tablet  1    Physical Examination  Filed Vitals:   11/28/11 1441  BP: 154/75  Pulse: 66  Resp:     Body mass index is 23.32 kg/(m^2).  General:  WDWN in NAD Gait: Normal HEENT: WNL Eyes: Pupils equal Pulmonary: normal non-labored breathing , without Rales, rhonchi,  wheezing Cardiac: RRR, without  Murmurs, rubs or gallops; Abdomen: soft, NT, no masses Skin: no rashes, ulcers noted  Vascular Exam Pulses: 3+ radial pulses bilaterally Carotid bruits: Carotid pulses to auscultation no bruits are heard Extremities without ischemic changes, no Gangrene , no cellulitis; no open wounds;  Musculoskeletal: no muscle wasting or atrophy   Neurologic: A&O X 3; Appropriate Affect ; SENSATION: normal; MOTOR FUNCTION:  moving all extremities equally. Speech is fluent/normal  Non-Invasive Vascular Imaging CAROTID DUPLEX 11/28/2011  Right ICA 20 - 39 % stenosis Left ICA 0 - 19% stenosis   ASSESSMENT/PLAN: Asymptomatic patient 8 months status post left CEA and doing well. The patient will followup in one year  with repeat carotid duplex. The patient's questions were encouraged and answered, he is in agreement with this plan.  Lauree Chandler ANP   Clinic MD: Hart Rochester

## 2011-11-29 NOTE — Addendum Note (Signed)
Addended by: Sharee Pimple on: 11/29/2011 08:44 AM   Modules accepted: Orders

## 2011-12-13 DIAGNOSIS — H9319 Tinnitus, unspecified ear: Secondary | ICD-10-CM | POA: Diagnosis not present

## 2011-12-13 DIAGNOSIS — I10 Essential (primary) hypertension: Secondary | ICD-10-CM | POA: Diagnosis not present

## 2011-12-13 DIAGNOSIS — B079 Viral wart, unspecified: Secondary | ICD-10-CM | POA: Diagnosis not present

## 2012-01-05 ENCOUNTER — Ambulatory Visit: Payer: Medicare Other | Admitting: Cardiology

## 2012-03-28 DIAGNOSIS — J019 Acute sinusitis, unspecified: Secondary | ICD-10-CM | POA: Diagnosis not present

## 2012-03-28 DIAGNOSIS — S6390XA Sprain of unspecified part of unspecified wrist and hand, initial encounter: Secondary | ICD-10-CM | POA: Diagnosis not present

## 2012-05-28 DIAGNOSIS — R350 Frequency of micturition: Secondary | ICD-10-CM | POA: Diagnosis not present

## 2012-05-28 DIAGNOSIS — C61 Malignant neoplasm of prostate: Secondary | ICD-10-CM | POA: Diagnosis not present

## 2012-05-28 DIAGNOSIS — R351 Nocturia: Secondary | ICD-10-CM | POA: Diagnosis not present

## 2012-05-30 ENCOUNTER — Encounter (HOSPITAL_COMMUNITY): Payer: Self-pay | Admitting: *Deleted

## 2012-05-30 ENCOUNTER — Emergency Department (HOSPITAL_COMMUNITY)
Admission: EM | Admit: 2012-05-30 | Discharge: 2012-05-30 | Disposition: A | Discharge status: Home / Self Care | Payer: Medicare Other | Attending: Emergency Medicine | Admitting: Emergency Medicine

## 2012-05-30 ENCOUNTER — Emergency Department (HOSPITAL_COMMUNITY): Payer: Medicare Other

## 2012-05-30 DIAGNOSIS — M545 Low back pain, unspecified: Secondary | ICD-10-CM | POA: Diagnosis not present

## 2012-05-30 DIAGNOSIS — Z79899 Other long term (current) drug therapy: Secondary | ICD-10-CM | POA: Diagnosis not present

## 2012-05-30 DIAGNOSIS — N23 Unspecified renal colic: Secondary | ICD-10-CM

## 2012-05-30 DIAGNOSIS — Z8679 Personal history of other diseases of the circulatory system: Secondary | ICD-10-CM | POA: Diagnosis not present

## 2012-05-30 DIAGNOSIS — N200 Calculus of kidney: Secondary | ICD-10-CM | POA: Diagnosis not present

## 2012-05-30 DIAGNOSIS — E785 Hyperlipidemia, unspecified: Secondary | ICD-10-CM | POA: Insufficient documentation

## 2012-05-30 DIAGNOSIS — Z860100 Personal history of colon polyps, unspecified: Secondary | ICD-10-CM | POA: Insufficient documentation

## 2012-05-30 DIAGNOSIS — I10 Essential (primary) hypertension: Secondary | ICD-10-CM | POA: Diagnosis not present

## 2012-05-30 DIAGNOSIS — Z8546 Personal history of malignant neoplasm of prostate: Secondary | ICD-10-CM | POA: Diagnosis not present

## 2012-05-30 DIAGNOSIS — R300 Dysuria: Secondary | ICD-10-CM | POA: Insufficient documentation

## 2012-05-30 DIAGNOSIS — R110 Nausea: Secondary | ICD-10-CM | POA: Insufficient documentation

## 2012-05-30 DIAGNOSIS — Z8719 Personal history of other diseases of the digestive system: Secondary | ICD-10-CM | POA: Insufficient documentation

## 2012-05-30 DIAGNOSIS — Z87448 Personal history of other diseases of urinary system: Secondary | ICD-10-CM | POA: Insufficient documentation

## 2012-05-30 DIAGNOSIS — K219 Gastro-esophageal reflux disease without esophagitis: Secondary | ICD-10-CM | POA: Diagnosis not present

## 2012-05-30 DIAGNOSIS — R319 Hematuria, unspecified: Secondary | ICD-10-CM | POA: Diagnosis not present

## 2012-05-30 DIAGNOSIS — Z862 Personal history of diseases of the blood and blood-forming organs and certain disorders involving the immune mechanism: Secondary | ICD-10-CM | POA: Diagnosis not present

## 2012-05-30 DIAGNOSIS — Z8659 Personal history of other mental and behavioral disorders: Secondary | ICD-10-CM | POA: Insufficient documentation

## 2012-05-30 DIAGNOSIS — R109 Unspecified abdominal pain: Secondary | ICD-10-CM | POA: Diagnosis not present

## 2012-05-30 DIAGNOSIS — Z951 Presence of aortocoronary bypass graft: Secondary | ICD-10-CM | POA: Insufficient documentation

## 2012-05-30 DIAGNOSIS — I2510 Atherosclerotic heart disease of native coronary artery without angina pectoris: Secondary | ICD-10-CM | POA: Insufficient documentation

## 2012-05-30 DIAGNOSIS — Z7982 Long term (current) use of aspirin: Secondary | ICD-10-CM | POA: Insufficient documentation

## 2012-05-30 DIAGNOSIS — K5730 Diverticulosis of large intestine without perforation or abscess without bleeding: Secondary | ICD-10-CM | POA: Diagnosis not present

## 2012-05-30 DIAGNOSIS — I447 Left bundle-branch block, unspecified: Secondary | ICD-10-CM | POA: Diagnosis not present

## 2012-05-30 LAB — COMPREHENSIVE METABOLIC PANEL
ALT: 18 U/L (ref 0–53)
AST: 23 U/L (ref 0–37)
Alkaline Phosphatase: 56 U/L (ref 39–117)
CO2: 22 mEq/L (ref 19–32)
Calcium: 8.8 mg/dL (ref 8.4–10.5)
Chloride: 106 mEq/L (ref 96–112)
GFR calc Af Amer: 74 mL/min — ABNORMAL LOW (ref >90)
GFR calc non Af Amer: 64 mL/min — ABNORMAL LOW (ref >90)
Glucose, Bld: 106 mg/dL — ABNORMAL HIGH (ref 70–99)
Potassium: 3.5 mEq/L (ref 3.5–5.1)
Sodium: 141 mEq/L (ref 135–145)
Total Bilirubin: 0.7 mg/dL (ref 0.3–1.2)

## 2012-05-30 LAB — CBC WITH DIFFERENTIAL/PLATELET
Basophils Absolute: 0 10*3/uL (ref 0.0–0.1)
Basophils Relative: 1 % (ref 0–1)
Eosinophils Absolute: 0.1 10*3/uL (ref 0.0–0.7)
Hemoglobin: 11.8 g/dL — ABNORMAL LOW (ref 13.0–17.0)
MCH: 28.6 pg (ref 26.0–34.0)
MCHC: 35.2 g/dL (ref 30.0–36.0)
Monocytes Relative: 11 % (ref 3–12)
Neutro Abs: 1.9 10*3/uL (ref 1.7–7.7)
Neutrophils Relative %: 46 % (ref 43–77)
Platelets: 177 10*3/uL (ref 150–400)
RDW: 14.5 % (ref 11.5–15.5)

## 2012-05-30 LAB — URINALYSIS, ROUTINE W REFLEX MICROSCOPIC
Glucose, UA: NEGATIVE mg/dL
Ketones, ur: NEGATIVE mg/dL
Leukocytes, UA: NEGATIVE
Protein, ur: NEGATIVE mg/dL
Urobilinogen, UA: 0.2 mg/dL (ref 0.0–1.0)

## 2012-05-30 LAB — POCT I-STAT, CHEM 8
BUN: 18 mg/dL (ref 6–23)
Calcium, Ion: 1.16 mmol/L (ref 1.13–1.30)
Creatinine, Ser: 1.1 mg/dL (ref 0.50–1.35)
Hemoglobin: 11.6 g/dL — ABNORMAL LOW (ref 13.0–17.0)
TCO2: 24 mmol/L (ref 0–100)

## 2012-05-30 LAB — CG4 I-STAT (LACTIC ACID): Lactic Acid, Venous: 0.66 mmol/L (ref 0.5–2.2)

## 2012-05-30 LAB — URINE MICROSCOPIC-ADD ON

## 2012-05-30 MED ORDER — KETOROLAC TROMETHAMINE 10 MG PO TABS: 10.0000 mg | ORAL_TABLET | Freq: Four times a day (QID) | ORAL | Status: DC | PRN

## 2012-05-30 NOTE — ED Notes (Signed)
Patient states abdominal pain and back pain starting approx 2 hours ago, patient states some nausea but denies vomiting or diarrhea,

## 2012-05-30 NOTE — ED Provider Notes (Signed)
History     CSN: [id number]  Arrival date & time 05/30/12  0865   First MD Initiated Contact with Patient 05/30/12 [phone]      Chief Complaint  Patient presents with  . Abdominal Pain  . Back Pain    Patient is a 72 y.o. male presenting with abdominal pain. The history is provided by the patient and the spouse.  Abdominal Pain Pain location:  Suprapubic Pain quality: cramping   Pain radiates to:  Back and scrotum Pain severity:  Moderate Onset quality:  Gradual Duration:  2 hours Timing:  Constant Progression:  Unchanged Chronicity:  Recurrent Context: awakening from sleep   Relieved by:  None tried Worsened by:  Nothing tried Associated symptoms: dysuria and nausea   Associated symptoms: no constipation, no diarrhea, no fever, no hematemesis, no shortness of breath and no vomiting    Pt reports he had been at his baseline health when he woke up with lower abdominal cramping that radiated into back and scrotum. He reports this occurred when he was up to urinate.  He reports he urinated without difficulty The pain is now localized to his abdomen He reports similar type pain in the past and has seen GI for this problem No new CP (reports chronic CP) No fever.  No vomiting.  No focal weakness He reports mild dysuria Past Medical History  Diagnosis Date  . Hypertension   . Prostate cancer     s/p cryoablation  . Pancreatitis Dec 2011    ERCP ok.  . Hiatal hernia   . Coronary artery disease     a. S/p CABG in 2001. b. S/p DES to protected LM and BMS to RCA 2006. c. 12/2009: had CP with negative stress test with repeat cath s/p DES to Cx;  d. 09/2011 Cath: patent  RCA, LM, LCX stents, patent LIMA->LAD & Radial->Diag,EF 50-55%-->Med Rx.  . Chronic chest pain   . Left bundle branch block   . GERD (gastroesophageal reflux disease)   . Hyperlipidemia   . Inguinal hernia   . Statin intolerance   . Anxiety disorder   . Peripheral vascular disease 04/10/11    a. s/p left carotid  endarterectomy 04/14/2011.  Marland Kitchen Esophageal stricture   . Renal cyst     Seen on CT 08/2011 also with circumferential bladder wall thickening  . Anginal pain   . Anemia 10/06/2011  . Colon polyp   . Hemorrhoids   . Diverticulosis   . Atrial fibrillation     Past Surgical History  Procedure Laterality Date  . Heart stents    . Cholecystectomy    . Inguinal hernia repair      bilateral  . Coronary artery bypass graft  2001  . Endarterectomy  04/14/2011    Procedure: ENDARTERECTOMY CAROTID;  Surgeon: Pryor Ochoa, MD;  Location: [facility] OR;  Service: Vascular;  Laterality: Left;  Would like to perform procedure first, at 0730  . Cardiac catheterization    . Coronary angioplasty    . Carotid endarterectomy    . Pr vein bypass graft,aorto-fem-pop      Family History  Problem Relation Age of Onset  . Heart disease Mother     Heart Disease before age 68  . Hypertension Mother   . Heart attack Mother   . Cancer Father   . Hypertension Sister   . Heart disease Sister     Heart Disease before age 44  . Cancer Sister   . Heart attack Sister   .  Hypertension Brother   . Heart disease Brother     Heart Disease before age 99  . Heart attack Brother     History  Substance Use Topics  . Smoking status: Never Smoker   . Smokeless tobacco: Current User    Types: Chew  . Alcohol Use: No      Review of Systems  Constitutional: Negative for fever.  Respiratory: Negative for shortness of breath.   Cardiovascular:       Chronic unchanged chest pain   Gastrointestinal: Positive for nausea and abdominal pain. Negative for vomiting, diarrhea, constipation, blood in stool and hematemesis.  Genitourinary: Positive for dysuria. Negative for difficulty urinating.  Musculoskeletal: Positive for back pain.  Neurological: Negative for weakness.  All other systems reviewed and are negative.    Allergies  Amoxicillin-pot clavulanate; Codeine; Erythromycin; Hydrocodone; Morphine; Nitrofuran  derivatives; Oxycodone hcl; Shellfish allergy; Tramadol; and Zolpidem tartrate  Home Medications   Current Outpatient Rx  Name  Route  Sig  Dispense  Refill  . amLODipine (NORVASC) 5 MG tablet   Oral   Take 2.5 mg by mouth 2 (two) times daily.         Marland Kitchen aspirin EC 81 MG tablet   Oral   Take 81 mg by mouth at bedtime.          . clopidogrel (PLAVIX) 75 MG tablet   Oral   Take 1 tablet (75 mg total) by mouth daily.   30 tablet   11   . diphenhydrAMINE (BENADRYL) 25 MG tablet   Oral   Take 25 mg by mouth at bedtime. sleep         . enalapril (VASOTEC) 20 MG tablet   Oral   Take 20 mg by mouth 2 (two) times daily.         . enalapril (VASOTEC) 20 MG tablet      TAKE 1 TABLET BY MOUTH TWICE A DAY   60 tablet   1   . isosorbide mononitrate (IMDUR) 60 MG 24 hr tablet   Oral   Take 30 mg by mouth 2 (two) times daily.         . metoCLOPramide (REGLAN) 10 MG tablet   Oral   Take 5 mg by mouth 4 (four) times daily as needed. Stomach pain         . metoprolol (LOPRESSOR) 50 MG tablet   Oral   Take 75 mg by mouth 2 (two) times daily.         . nitroGLYCERIN (NITROSTAT) 0.4 MG SL tablet   Sublingual   Place 0.4 mg under the tongue every 5 (five) minutes as needed. For chest pain         . Omega-3 Fatty Acids (FISH OIL) 1000 MG CAPS   Oral   Take 1 capsule by mouth 2 (two) times daily.         . pantoprazole (PROTONIX) 40 MG tablet   Oral   Take 40 mg by mouth 2 (two) times daily.          . Tamsulosin HCl (FLOMAX) 0.4 MG CAPS   Oral   Take 1 capsule (0.4 mg total) by mouth daily after supper.   30 capsule   6     BP 140/71  Temp(Src) 97.5 F (36.4 C) (Oral)  Resp 20  Wt 143 lb (64.864 kg)  BMI 24.18 kg/m2  SpO2 99%  Physical Exam CONSTITUTIONAL: Well developed/well nourished HEAD: Normocephalic/atraumatic EYES: EOMI/PERRL, no icterus  ENMT: Mucous membranes moist NECK: supple no meningeal signs SPINE:entire spine nontender CV:  S1/S2 noted, no murmurs/rubs/gallops noted LUNGS: Lungs are clear to auscultation bilaterally, no apparent distress ABDOMEN: soft, mild suprapubic tenderness, no rebound or guarding GU:no cva tenderness. No inguinal hernia noted.  No scrotal edema/tenderness noted.  Wife present in room (patient requested presence of wife during exam) NEURO: Pt is awake/alert, moves all extremitiesx4, no focal motor deficits are noted in his lower extremities EXTREMITIES: pulses normal/equal in distal LE, full ROM SKIN: warm, color normal PSYCH: no abnormalities of mood noted  ED Course  Procedures   Labs Reviewed  POCT I-STAT, CHEM 8 - Abnormal; Notable for the following:    Glucose, Bld 106 (*)    Hemoglobin 11.6 (*)    HCT 34.0 (*)    All other components within normal limits  COMPREHENSIVE METABOLIC PANEL  CBC WITH DIFFERENTIAL  LIPASE, BLOOD  URINALYSIS, ROUTINE W REFLEX MICROSCOPIC  CG4 I-STAT (LACTIC ACID)   6:16 AM Pt presents with lower abd cramping that had previously radiated into back/groin but now localized to abdomen.   He is well appearing, no distress.  He has mild tenderness to palpation.  He refuses any pain meds Labs ordered.  Will defer imaging for now (he has had multiple CT scans of his abd/pelvis in past year) 6:56 AM Pt reports continued pain and nausea but he does not want any medicine He has significant hematuria.  Previous CT scans have shown renal stones.  Suspect he may now have ureteral stone given location of initial pain (radiated to back and groin) and labs.  Since this is a change from previous evaluations, will evaluate with CT abd/pelvis noncontrast 7:18 AM D/w dr Ranae Palms who will f/u on Ct imaging and disposition patient   MDM  Nursing notes including past medical history and social history reviewed and considered in documentation Labs/vital reviewed and considered Previous records reviewed and considered - previous CT imaging results reviewed         Date: 05/30/2012  Rate: 59  Rhythm: normal sinus rhythm  QRS Axis: left  Intervals: normal  ST/T Wave abnormalities: nonspecific ST changes  Conduction Disutrbances:left bundle branch block  Narrative Interpretation:   Old EKG Reviewed: unchanged    Joya Gaskins, MD 05/30/12 [phone]

## 2012-08-09 DIAGNOSIS — R1032 Left lower quadrant pain: Secondary | ICD-10-CM | POA: Diagnosis not present

## 2012-08-09 DIAGNOSIS — J209 Acute bronchitis, unspecified: Secondary | ICD-10-CM | POA: Diagnosis not present

## 2012-08-09 DIAGNOSIS — R110 Nausea: Secondary | ICD-10-CM | POA: Diagnosis not present

## 2012-08-09 DIAGNOSIS — J019 Acute sinusitis, unspecified: Secondary | ICD-10-CM | POA: Diagnosis not present

## 2012-08-13 DIAGNOSIS — J309 Allergic rhinitis, unspecified: Secondary | ICD-10-CM | POA: Diagnosis not present

## 2012-08-13 DIAGNOSIS — IMO0002 Reserved for concepts with insufficient information to code with codable children: Secondary | ICD-10-CM | POA: Diagnosis not present

## 2012-08-13 DIAGNOSIS — K5732 Diverticulitis of large intestine without perforation or abscess without bleeding: Secondary | ICD-10-CM | POA: Diagnosis not present

## 2012-09-28 ENCOUNTER — Encounter: Payer: Self-pay | Admitting: Physician Assistant

## 2012-09-28 ENCOUNTER — Encounter (HOSPITAL_COMMUNITY): Payer: Self-pay | Admitting: Physical Medicine and Rehabilitation

## 2012-09-28 ENCOUNTER — Emergency Department (HOSPITAL_COMMUNITY)
Admission: EM | Admit: 2012-09-28 | Discharge: 2012-09-28 | Disposition: A | Discharge status: Home / Self Care | Payer: Medicare Other | Attending: Emergency Medicine | Admitting: Emergency Medicine

## 2012-09-28 ENCOUNTER — Emergency Department (HOSPITAL_COMMUNITY): Payer: Medicare Other

## 2012-09-28 DIAGNOSIS — Z8546 Personal history of malignant neoplasm of prostate: Secondary | ICD-10-CM | POA: Diagnosis not present

## 2012-09-28 DIAGNOSIS — Z8679 Personal history of other diseases of the circulatory system: Secondary | ICD-10-CM

## 2012-09-28 DIAGNOSIS — Z8659 Personal history of other mental and behavioral disorders: Secondary | ICD-10-CM | POA: Diagnosis not present

## 2012-09-28 DIAGNOSIS — I200 Unstable angina: Secondary | ICD-10-CM | POA: Insufficient documentation

## 2012-09-28 DIAGNOSIS — I4891 Unspecified atrial fibrillation: Secondary | ICD-10-CM | POA: Diagnosis not present

## 2012-09-28 DIAGNOSIS — Z9861 Coronary angioplasty status: Secondary | ICD-10-CM | POA: Diagnosis not present

## 2012-09-28 DIAGNOSIS — Z9889 Other specified postprocedural states: Secondary | ICD-10-CM | POA: Diagnosis not present

## 2012-09-28 DIAGNOSIS — R0602 Shortness of breath: Secondary | ICD-10-CM | POA: Insufficient documentation

## 2012-09-28 DIAGNOSIS — Z862 Personal history of diseases of the blood and blood-forming organs and certain disorders involving the immune mechanism: Secondary | ICD-10-CM | POA: Diagnosis not present

## 2012-09-28 DIAGNOSIS — I10 Essential (primary) hypertension: Secondary | ICD-10-CM | POA: Diagnosis not present

## 2012-09-28 DIAGNOSIS — Z79899 Other long term (current) drug therapy: Secondary | ICD-10-CM | POA: Insufficient documentation

## 2012-09-28 DIAGNOSIS — I2510 Atherosclerotic heart disease of native coronary artery without angina pectoris: Secondary | ICD-10-CM | POA: Diagnosis not present

## 2012-09-28 DIAGNOSIS — Z87448 Personal history of other diseases of urinary system: Secondary | ICD-10-CM | POA: Diagnosis not present

## 2012-09-28 DIAGNOSIS — R188 Other ascites: Secondary | ICD-10-CM | POA: Diagnosis not present

## 2012-09-28 DIAGNOSIS — Z860100 Personal history of colon polyps, unspecified: Secondary | ICD-10-CM | POA: Insufficient documentation

## 2012-09-28 DIAGNOSIS — Z951 Presence of aortocoronary bypass graft: Secondary | ICD-10-CM | POA: Insufficient documentation

## 2012-09-28 DIAGNOSIS — K219 Gastro-esophageal reflux disease without esophagitis: Secondary | ICD-10-CM | POA: Insufficient documentation

## 2012-09-28 DIAGNOSIS — R079 Chest pain, unspecified: Secondary | ICD-10-CM | POA: Diagnosis not present

## 2012-09-28 DIAGNOSIS — G8929 Other chronic pain: Secondary | ICD-10-CM | POA: Diagnosis not present

## 2012-09-28 DIAGNOSIS — Z8639 Personal history of other endocrine, nutritional and metabolic disease: Secondary | ICD-10-CM | POA: Insufficient documentation

## 2012-09-28 DIAGNOSIS — R0789 Other chest pain: Secondary | ICD-10-CM | POA: Diagnosis not present

## 2012-09-28 LAB — COMPREHENSIVE METABOLIC PANEL
AST: 17 U/L (ref 0–37)
BUN: 15 mg/dL (ref 6–23)
CO2: 26 mEq/L (ref 19–32)
Chloride: 103 mEq/L (ref 96–112)
Creatinine, Ser: 1.05 mg/dL (ref 0.50–1.35)
GFR calc Af Amer: 80 mL/min — ABNORMAL LOW (ref >90)
GFR calc non Af Amer: 69 mL/min — ABNORMAL LOW (ref >90)
Glucose, Bld: 99 mg/dL (ref 70–99)
Total Bilirubin: 1 mg/dL (ref 0.3–1.2)

## 2012-09-28 LAB — TROPONIN I: Troponin I: <0.3 ng/mL (ref <0.30)

## 2012-09-28 LAB — CBC WITH DIFFERENTIAL/PLATELET
HCT: 37.2 % — ABNORMAL LOW (ref 39.0–52.0)
Hemoglobin: 13 g/dL (ref 13.0–17.0)
Lymphocytes Relative: 26 % (ref 12–46)
MCV: 83.4 fL (ref 78.0–100.0)
Monocytes Absolute: 0.4 10*3/uL (ref 0.1–1.0)
Monocytes Relative: 7 % (ref 3–12)
Neutro Abs: 3.7 10*3/uL (ref 1.7–7.7)
WBC: 5.7 10*3/uL (ref 4.0–10.5)

## 2012-09-28 NOTE — Progress Notes (Unsigned)
Patient ID: [name] [name], male   DOB: 1940-06-08, 72 y.o.   MRN: [id number] Through communication with the ER, Dr. Shirlee Latch is requesting this patient have f/u with Dr. Jens Som. I have left a message on our office's scheduling voicemail requesting a follow-up appointment, and our office will call the patient with this appointment. [name] PA-C

## 2012-09-28 NOTE — ED Notes (Signed)
Pt presents to department via [facility]. States chronic chest pain x1 year, states pain became worse this morning after working on car. Went to [facility] in [place], was transferred here. 1/10 chest pressure upon arrival to ED. Also states SOB. Pt is conscious alert and oriented x4. Received 324 ASA. BBB on EKG. 20g LAC.

## 2012-09-28 NOTE — ED Provider Notes (Signed)
CSN: [id number]     Arrival date & time 09/28/12  1216 History   First MD Initiated Contact with Patient 09/28/12 1300     Chief Complaint  Patient presents with  . Chest Pain  . Shortness of Breath   (Consider location/radiation/quality/duration/timing/severity/associated sxs/prior Treatment) HPI 72 y.o. Male with history of unstable angina who had chest pain today at 0700 and went to see walk in clinic who gave the nitro and aspirin and called ems. He has a history of chest pain, which typically occurs with morning activity but then eases off several minutes into the activity.   Patient states that the pain resolved prior to coming to ed.  Patient states he has chest pain daily and went in to clinic and they called ems.  He states he felt like his body "felt funny" and that was what made it different from his daily chest pain.  He denies otherwise feeling any different than his usual chest pain.  He was last admitted for this 9/13 when his cath was stable from prior and at that time it was felt his chest pain was noncardiac.   Past Medical History  Diagnosis Date  . Hypertension   . Prostate cancer     s/p cryoablation  . Pancreatitis Dec 2011    ERCP ok.  . Hiatal hernia   . Coronary artery disease     a. S/p CABG in 2001. b. S/p DES to protected LM and BMS to RCA 2006. c. 12/2009: had CP with negative stress test with repeat cath s/p DES to Cx;  d. 09/2011 Cath: patent  RCA, LM, LCX stents, patent LIMA->LAD & Radial->Diag,EF 50-55%-->Med Rx.  . Chronic chest pain   . Left bundle branch block   . GERD (gastroesophageal reflux disease)   . Hyperlipidemia   . Inguinal hernia   . Statin intolerance   . Anxiety disorder   . Peripheral vascular disease 04/10/11    a. s/p left carotid endarterectomy 04/14/2011.  Marland Kitchen Esophageal stricture   . Renal cyst     Seen on CT 08/2011 also with circumferential bladder wall thickening  . Anginal pain   . Anemia 10/06/2011  . Colon polyp   .  Hemorrhoids   . Diverticulosis   . Atrial fibrillation    Past Surgical History  Procedure Laterality Date  . Heart stents    . Cholecystectomy    . Inguinal hernia repair      bilateral  . Coronary artery bypass graft  2001  . Endarterectomy  04/14/2011    Procedure: ENDARTERECTOMY CAROTID;  Surgeon: Pryor Ochoa, MD;  Location: [facility] OR;  Service: Vascular;  Laterality: Left;  Would like to perform procedure first, at 0730  . Cardiac catheterization    . Coronary angioplasty    . Carotid endarterectomy    . Pr vein bypass graft,aorto-fem-pop     Family History  Problem Relation Age of Onset  . Heart disease Mother     Heart Disease before age 70  . Hypertension Mother   . Heart attack Mother   . Cancer Father   . Hypertension Sister   . Heart disease Sister     Heart Disease before age 32  . Cancer Sister   . Heart attack Sister   . Hypertension Brother   . Heart disease Brother     Heart Disease before age 70  . Heart attack Brother    History  Substance Use Topics  . Smoking status: Never  Smoker   . Smokeless tobacco: Current User    Types: Chew  . Alcohol Use: No    Review of Systems  All other systems reviewed and are negative.    Allergies  Amoxicillin-pot clavulanate; Codeine; Erythromycin; Hydrocodone; Morphine; Nitrofuran derivatives; Oxycodone hcl; Shellfish allergy; Tramadol; and Zolpidem tartrate  Home Medications   Current Outpatient Rx  Name  Route  Sig  Dispense  Refill  . amLODipine (NORVASC) 5 MG tablet   Oral   Take 2.5 mg by mouth 2 (two) times daily.         Marland Kitchen aspirin EC 81 MG tablet   Oral   Take 81 mg by mouth at bedtime.          . diphenhydrAMINE (BENADRYL) 25 MG tablet   Oral   Take 25 mg by mouth at bedtime. sleep         . metoCLOPramide (REGLAN) 10 MG tablet   Oral   Take 5 mg by mouth 4 (four) times daily as needed. Stomach pain         . metoprolol (LOPRESSOR) 50 MG tablet   Oral   Take 75 mg by mouth 2  (two) times daily.         . nitroGLYCERIN (NITROSTAT) 0.4 MG SL tablet   Sublingual   Place 0.4 mg under the tongue every 5 (five) minutes as needed. For chest pain         . Omega-3 Fatty Acids (FISH OIL) 1000 MG CAPS   Oral   Take 1 capsule by mouth 2 (two) times daily.         . pantoprazole (PROTONIX) 40 MG tablet   Oral   Take 40 mg by mouth 2 (two) times daily.           BP 141/63  Pulse 55  Temp(Src) 98 F (36.7 C) (Oral)  Resp 18  SpO2 98% Physical Exam  Nursing note and vitals reviewed. Constitutional: He appears well-developed and well-nourished.  HENT:  Head: Normocephalic and atraumatic.  Right Ear: External ear normal.  Left Ear: External ear normal.  Nose: Nose normal.  Mouth/Throat: Oropharynx is clear and moist.  Eyes: Conjunctivae and EOM are normal. Pupils are equal, round, and reactive to light.  Neck: Normal range of motion. Neck supple.  Cardiovascular: Normal rate, regular rhythm, normal heart sounds and intact distal pulses.   Pulmonary/Chest: Effort normal and breath sounds normal.  Abdominal: Soft. Bowel sounds are normal.  Musculoskeletal: Normal range of motion.  Neurological: He is alert. He has normal reflexes.  Skin: Skin is warm and dry.  Psychiatric: He has a normal mood and affect. His behavior is normal. Judgment and thought content normal.    ED Course  Procedures (including critical care time) Labs Review Labs Reviewed - No data to display Imaging Review No results found.  MDM   72 y.o. Male with history of cad presents with his stable angina.  EKG is stable from prior with lbbb.  Patient discussed with Dr. Shirlee Latch and he is in agreement with discharging the patient.  The patient has been pain free since arrival and has troponin negative at 6 hours after onset of pain.  Patient given strict return precautions and advised to call Dr. Jens Som Monday for reevaluation.  Patient voices understanding and agreement with plan.     Hilario Quarry, MD 09/28/12 [phone]

## 2012-09-28 NOTE — Discharge Instructions (Signed)
Please call your heart doctor on Monday and recheck.  Return if any change in your usual symptoms or worse at any time.   Chest Pain (Nonspecific) It is often hard to give a specific diagnosis for the cause of chest pain. There is always a chance that your pain could be related to something serious, such as a heart attack or a blood clot in the lungs. You need to follow up with your caregiver for further evaluation. CAUSES   Heartburn.  Pneumonia or bronchitis.  Anxiety or stress.  Inflammation around your heart (pericarditis) or lung (pleuritis or pleurisy).  A blood clot in the lung.  A collapsed lung (pneumothorax). It can develop suddenly on its own (spontaneous pneumothorax) or from injury (trauma) to the chest.  Shingles infection (herpes zoster virus). The chest wall is composed of bones, muscles, and cartilage. Any of these can be the source of the pain.  The bones can be bruised by injury.  The muscles or cartilage can be strained by coughing or overwork.  The cartilage can be affected by inflammation and become sore (costochondritis). DIAGNOSIS  Lab tests or other studies, such as X-rays, electrocardiography, stress testing, or cardiac imaging, may be needed to find the cause of your pain.  TREATMENT   Treatment depends on what may be causing your chest pain. Treatment may include:  Acid blockers for heartburn.  Anti-inflammatory medicine.  Pain medicine for inflammatory conditions.  Antibiotics if an infection is present.  You may be advised to change lifestyle habits. This includes stopping smoking and avoiding alcohol, caffeine, and chocolate.  You may be advised to keep your head raised (elevated) when sleeping. This reduces the chance of acid going backward from your stomach into your esophagus.  Most of the time, nonspecific chest pain will improve within 2 to 3 days with rest and mild pain medicine. HOME CARE INSTRUCTIONS   If antibiotics were  prescribed, take your antibiotics as directed. Finish them even if you start to feel better.  For the next few days, avoid physical activities that bring on chest pain. Continue physical activities as directed.  Do not smoke.  Avoid drinking alcohol.  Only take over-the-counter or prescription medicine for pain, discomfort, or fever as directed by your caregiver.  Follow your caregiver's suggestions for further testing if your chest pain does not go away.  Keep any follow-up appointments you made. If you do not go to an appointment, you could develop lasting (chronic) problems with pain. If there is any problem keeping an appointment, you must call to reschedule. SEEK MEDICAL CARE IF:   You think you are having problems from the medicine you are taking. Read your medicine instructions carefully.  Your chest pain does not go away, even after treatment.  You develop a rash with blisters on your chest. SEEK IMMEDIATE MEDICAL CARE IF:   You have increased chest pain or pain that spreads to your arm, neck, jaw, back, or abdomen.  You develop shortness of breath, an increasing cough, or you are coughing up blood.  You have severe back or abdominal pain, feel nauseous, or vomit.  You develop severe weakness, fainting, or chills.  You have a fever. THIS IS AN EMERGENCY. Do not wait to see if the pain will go away. Get medical help at once. Call your local emergency services (911 in [place] Do not drive yourself to the hospital. MAKE SURE YOU:   Understand these instructions.  Will watch your condition.  Will get help  right away if you are not doing well or get worse. Document Released: 10/19/2004 Document Revised: 04/03/2011 Document Reviewed: 08/15/2007 [facility] Patient Information 2014 [place].

## 2012-10-02 ENCOUNTER — Encounter (HOSPITAL_COMMUNITY): Payer: Self-pay

## 2012-10-02 ENCOUNTER — Inpatient Hospital Stay (HOSPITAL_COMMUNITY)
Admission: EM | Admit: 2012-10-02 | Discharge: 2012-10-05 | DRG: 286 | Disposition: A | Discharge status: Home / Self Care | Payer: Medicare Other | Attending: Internal Medicine | Admitting: Internal Medicine

## 2012-10-02 DIAGNOSIS — F411 Generalized anxiety disorder: Secondary | ICD-10-CM | POA: Diagnosis present

## 2012-10-02 DIAGNOSIS — I7779 Dissection of other artery: Secondary | ICD-10-CM | POA: Diagnosis not present

## 2012-10-02 DIAGNOSIS — R071 Chest pain on breathing: Secondary | ICD-10-CM | POA: Diagnosis not present

## 2012-10-02 DIAGNOSIS — Z7982 Long term (current) use of aspirin: Secondary | ICD-10-CM | POA: Diagnosis not present

## 2012-10-02 DIAGNOSIS — Z91013 Allergy to seafood: Secondary | ICD-10-CM | POA: Diagnosis not present

## 2012-10-02 DIAGNOSIS — Z7902 Long term (current) use of antithrombotics/antiplatelets: Secondary | ICD-10-CM | POA: Diagnosis not present

## 2012-10-02 DIAGNOSIS — R0789 Other chest pain: Secondary | ICD-10-CM | POA: Diagnosis not present

## 2012-10-02 DIAGNOSIS — Z79899 Other long term (current) drug therapy: Secondary | ICD-10-CM

## 2012-10-02 DIAGNOSIS — Z9089 Acquired absence of other organs: Secondary | ICD-10-CM | POA: Diagnosis not present

## 2012-10-02 DIAGNOSIS — I4891 Unspecified atrial fibrillation: Secondary | ICD-10-CM | POA: Diagnosis present

## 2012-10-02 DIAGNOSIS — E7800 Pure hypercholesterolemia, unspecified: Secondary | ICD-10-CM | POA: Diagnosis present

## 2012-10-02 DIAGNOSIS — F17200 Nicotine dependence, unspecified, uncomplicated: Secondary | ICD-10-CM | POA: Diagnosis present

## 2012-10-02 DIAGNOSIS — I2510 Atherosclerotic heart disease of native coronary artery without angina pectoris: Secondary | ICD-10-CM | POA: Diagnosis present

## 2012-10-02 DIAGNOSIS — R1310 Dysphagia, unspecified: Secondary | ICD-10-CM

## 2012-10-02 DIAGNOSIS — E785 Hyperlipidemia, unspecified: Secondary | ICD-10-CM | POA: Diagnosis present

## 2012-10-02 DIAGNOSIS — Z8546 Personal history of malignant neoplasm of prostate: Secondary | ICD-10-CM

## 2012-10-02 DIAGNOSIS — R1013 Epigastric pain: Secondary | ICD-10-CM

## 2012-10-02 DIAGNOSIS — K219 Gastro-esophageal reflux disease without esophagitis: Secondary | ICD-10-CM

## 2012-10-02 DIAGNOSIS — K449 Diaphragmatic hernia without obstruction or gangrene: Secondary | ICD-10-CM | POA: Diagnosis present

## 2012-10-02 DIAGNOSIS — Z9861 Coronary angioplasty status: Secondary | ICD-10-CM

## 2012-10-02 DIAGNOSIS — Z881 Allergy status to other antibiotic agents status: Secondary | ICD-10-CM

## 2012-10-02 DIAGNOSIS — I10 Essential (primary) hypertension: Secondary | ICD-10-CM | POA: Diagnosis present

## 2012-10-02 DIAGNOSIS — Z91041 Radiographic dye allergy status: Secondary | ICD-10-CM | POA: Diagnosis not present

## 2012-10-02 DIAGNOSIS — I739 Peripheral vascular disease, unspecified: Secondary | ICD-10-CM | POA: Diagnosis present

## 2012-10-02 DIAGNOSIS — Z888 Allergy status to other drugs, medicaments and biological substances status: Secondary | ICD-10-CM

## 2012-10-02 DIAGNOSIS — R413 Other amnesia: Secondary | ICD-10-CM | POA: Diagnosis present

## 2012-10-02 DIAGNOSIS — K5730 Diverticulosis of large intestine without perforation or abscess without bleeding: Secondary | ICD-10-CM | POA: Diagnosis present

## 2012-10-02 DIAGNOSIS — R079 Chest pain, unspecified: Secondary | ICD-10-CM | POA: Diagnosis not present

## 2012-10-02 DIAGNOSIS — R109 Unspecified abdominal pain: Secondary | ICD-10-CM

## 2012-10-02 DIAGNOSIS — C61 Malignant neoplasm of prostate: Secondary | ICD-10-CM

## 2012-10-02 DIAGNOSIS — I447 Left bundle-branch block, unspecified: Secondary | ICD-10-CM | POA: Diagnosis present

## 2012-10-02 DIAGNOSIS — I25810 Atherosclerosis of coronary artery bypass graft(s) without angina pectoris: Secondary | ICD-10-CM

## 2012-10-02 DIAGNOSIS — I200 Unstable angina: Secondary | ICD-10-CM

## 2012-10-02 DIAGNOSIS — Z86010 Personal history of colonic polyps: Secondary | ICD-10-CM

## 2012-10-02 DIAGNOSIS — Z8249 Family history of ischemic heart disease and other diseases of the circulatory system: Secondary | ICD-10-CM

## 2012-10-02 DIAGNOSIS — Z951 Presence of aortocoronary bypass graft: Secondary | ICD-10-CM

## 2012-10-02 DIAGNOSIS — D649 Anemia, unspecified: Secondary | ICD-10-CM

## 2012-10-02 DIAGNOSIS — K529 Noninfective gastroenteritis and colitis, unspecified: Secondary | ICD-10-CM

## 2012-10-02 LAB — CBC
MCV: 83.3 fL (ref 78.0–100.0)
Platelets: 195 10*3/uL (ref 150–400)
RDW: 13.5 % (ref 11.5–15.5)
WBC: 6.6 10*3/uL (ref 4.0–10.5)

## 2012-10-02 LAB — BASIC METABOLIC PANEL
Chloride: 98 mEq/L (ref 96–112)
Creatinine, Ser: 1.09 mg/dL (ref 0.50–1.35)
GFR calc Af Amer: 76 mL/min — ABNORMAL LOW (ref >90)
Sodium: 136 mEq/L (ref 135–145)

## 2012-10-02 LAB — PRO B NATRIURETIC PEPTIDE: Pro B Natriuretic peptide (BNP): 329.8 pg/mL — ABNORMAL HIGH (ref 0–125)

## 2012-10-02 NOTE — ED Provider Notes (Signed)
CSN: [id number]     Arrival date & time 10/02/12  1739 History   First MD Initiated Contact with Patient 10/02/12 1850     Chief Complaint  Patient presents with  . Chest Pain  . Abdominal Pain  . Shortness of Breath    HPI Comments: Pt is a 72 y.o. Male with history of unstable angina, HTN, CAD s/p CABG in 2001, carotid endarectomy, PCI in 2006/2013, LBBB, HLD, PAD, Atrial Fibrillation who presents to the ED with ongoing unstable angina.  Pt was originally seen on 9/6 and discharged home with the dx of stable angina after discussion with Dr. Shirlee Latch, the oncall cardiologist at the time.  Pt was instructed to f/u w/ Dr Jens Som in the office, pending an appointment.   However, pt states the pain has been getting worse over the past four days, especially after eating meals, epigastric pain that radiates up into his anterior neck and anterior chest, denies radiating chest pain .  Pt unsure if pain worsens after laying down or sitting up, unsure if it worsens with activity but does come and go at rest.  Denies any hematochezia, melena, hematemesis, fever, chills, sweats, weight loss, dysphagia progressive, odynophagia, lower extremity edema, nausea, vomiting, sharp scapular pain, pain between his scapulas, PND, orthopnea.  The history is provided by the patient.   Past Medical History  Diagnosis Date  . Hypertension   . Prostate cancer     s/p cryoablation  . Pancreatitis Dec 2011    ERCP ok.  . Hiatal hernia   . Coronary artery disease     a. S/p CABG in 2001. b. S/p DES to protected LM and BMS to RCA 2006. c. 12/2009: had CP with negative stress test with repeat cath s/p DES to Cx;  d. 09/2011 Cath: patent  RCA, LM, LCX stents, patent LIMA->LAD & Radial->Diag,EF 50-55%-->Med Rx.  . Chronic chest pain   . Left bundle branch block   . GERD (gastroesophageal reflux disease)   . Hyperlipidemia   . Inguinal hernia   . Statin intolerance   . Anxiety disorder   . Peripheral vascular disease  04/10/11    a. s/p left carotid endarterectomy 04/14/2011.  Marland Kitchen Esophageal stricture   . Renal cyst     Seen on CT 08/2011 also with circumferential bladder wall thickening  . Anginal pain   . Anemia 10/06/2011  . Colon polyp   . Hemorrhoids   . Diverticulosis   . Atrial fibrillation    Past Surgical History  Procedure Laterality Date  . Heart stents    . Cholecystectomy    . Inguinal hernia repair      bilateral  . Coronary artery bypass graft  2001  . Endarterectomy  04/14/2011    Procedure: ENDARTERECTOMY CAROTID;  Surgeon: Pryor Ochoa, MD;  Location: [facility] OR;  Service: Vascular;  Laterality: Left;  Would like to perform procedure first, at 0730  . Cardiac catheterization    . Coronary angioplasty    . Carotid endarterectomy    . Pr vein bypass graft,aorto-fem-pop     Family History  Problem Relation Age of Onset  . Heart disease Mother     Heart Disease before age 23  . Hypertension Mother   . Heart attack Mother   . Cancer Father   . Hypertension Sister   . Heart disease Sister     Heart Disease before age 30  . Cancer Sister   . Heart attack Sister   . Hypertension  Brother   . Heart disease Brother     Heart Disease before age 43  . Heart attack Brother    History  Substance Use Topics  . Smoking status: Never Smoker   . Smokeless tobacco: Current User    Types: Chew  . Alcohol Use: No    Review of Systems  Constitutional: Positive for appetite change. Negative for fever, activity change, fatigue and unexpected weight change.  HENT: Negative for sore throat, trouble swallowing and neck pain.   Eyes: Negative.   Respiratory: Positive for chest tightness and shortness of breath. Negative for cough and wheezing.   Cardiovascular: Negative for chest pain, palpitations and leg swelling.  Gastrointestinal: Positive for abdominal pain (epigastric). Negative for nausea, vomiting, diarrhea, blood in stool and abdominal distention.  Endocrine: Negative.    Genitourinary: Negative.   Musculoskeletal: Negative.   Skin: Negative.   Neurological: Negative for dizziness, weakness, light-headedness and headaches.  Hematological: Negative.   Psychiatric/Behavioral: Negative.     Allergies  Amoxicillin-pot clavulanate; Codeine; Erythromycin; Hydrocodone; Morphine; Nitrofuran derivatives; Oxycodone hcl; Shellfish allergy; Tramadol; and Zolpidem tartrate  Home Medications   Current Outpatient Rx  Name  Route  Sig  Dispense  Refill  . amLODipine (NORVASC) 5 MG tablet   Oral   Take 2.5 mg by mouth 2 (two) times daily.         Marland Kitchen aspirin EC 81 MG tablet   Oral   Take 81 mg by mouth at bedtime.          . clopidogrel (PLAVIX) 75 MG tablet   Oral   Take 75 mg by mouth daily.         . diphenhydrAMINE (BENADRYL) 25 MG tablet   Oral   Take 25 mg by mouth at bedtime. sleep         . isosorbide dinitrate (ISORDIL) 20 MG tablet   Oral   Take 20 mg by mouth 2 (two) times daily.         . metoCLOPramide (REGLAN) 10 MG tablet   Oral   Take 5 mg by mouth 4 (four) times daily as needed. Stomach pain         . metoprolol (LOPRESSOR) 50 MG tablet   Oral   Take 25 mg by mouth 2 (two) times daily.          . nitroGLYCERIN (NITROSTAT) 0.4 MG SL tablet   Sublingual   Place 0.4 mg under the tongue every 5 (five) minutes as needed. For chest pain         . Omega-3 Fatty Acids (FISH OIL) 1000 MG CAPS   Oral   Take 1 capsule by mouth 2 (two) times daily.         . pantoprazole (PROTONIX) 40 MG tablet   Oral   Take 40 mg by mouth 2 (two) times daily.           BP 165/63  Pulse 70  Temp(Src) 97.8 F (36.6 C) (Oral)  Resp 17  Ht 5\' 4"  (1.626 m)  Wt 143 lb (64.864 kg)  BMI 24.53 kg/m2  SpO2 100% Physical Exam  Constitutional: He is oriented to person, place, and time. He appears well-developed and well-nourished. No distress.  HENT:  Head: Normocephalic and atraumatic.  Nose: Nose normal.  Missing all teeth. MMM, no  erythema/exudates   Eyes: Conjunctivae and EOM are normal. Pupils are equal, round, and reactive to light.  Neck: Trachea normal and normal range of motion. No hepatojugular  reflux and no JVD present.  Cardiovascular: Normal rate, regular rhythm, intact distal pulses and normal pulses.   Murmur heard.  Systolic murmur is present with a grade of 2/6  Pulmonary/Chest: Effort normal and breath sounds normal.  Abdominal: Normal appearance and bowel sounds are normal. He exhibits no distension. There is no hepatosplenomegaly. There is tenderness in the epigastric area. There is no rigidity, no guarding, no tenderness at McBurney's point and negative Murphy's sign.  Neurological: He is alert and oriented to person, place, and time. He has normal strength. No cranial nerve deficit or sensory deficit.  Skin: Skin is warm and dry. He is not diaphoretic.  Psychiatric: He has a normal mood and affect. His speech is normal and behavior is normal.    ED Course  Procedures (including critical care time)  Date: 10/02/2012  Rate: 80  Rhythm: normal sinus rhythm  QRS Axis: widened, non specific   Intervals: normal  ST/T Wave abnormalities: normal  Conduction Disutrbances: none  Narrative Interpretation: Occasional PVC's, RAD  Old EKG Reviewed: No significant changes noted  Labs Review Labs Reviewed  BASIC METABOLIC PANEL - Abnormal; Notable for the following:    GFR calc non Af Amer 66 (*)    GFR calc Af Amer 76 (*)    All other components within normal limits  PRO B NATRIURETIC PEPTIDE - Abnormal; Notable for the following:    Pro B Natriuretic peptide (BNP) 329.8 (*)    All other components within normal limits  CBC  POCT I-STAT TROPONIN I   Imaging Review No results found.  MDM   1. Abdominal pain   2. Chest pain   Pt c/o epigastric pain with anterior chest pain/neck radiation that worsens after eating and is intermittent that does not seem to be related to activity.  Pt originally seen  on 9/6 for similar complaints that have worsened since then.  Due to his multiple risk factors including CABG, PCI x 3, Carotid Endarterectomy, vein bypass graft aorto-fem-pop, will need observation and further risk stratification.  Initial EKG unchanged from previous and labs basically normal except for mildly elevated BNP (329.8) w/o Sx of CHF.    8:47 PM - Spoke with Dr. Terressa Koyanagi, on call physician for [facility], who will evaluate the pt.    Twana First Paulina Fusi, DO of [facility] 10/02/2012, 8:49 PM  Briscoe Deutscher, DO 10/02/12 2049

## 2012-10-02 NOTE — ED Provider Notes (Signed)
Pt is a 72 y/o male with hx of significant vasculopathy, he has had carotid endarterectomy, coronary artery bypass grafting, aorto femoropopliteal bypass as well. He has multiple other medical problems many of which are respecters for coronary disease. He was seen earlier in the week for similar symptoms which he states have worsened including chest pain, back pain with radiation up into his neck and bilateral jaw. On exam he has a soft abdomen, minimal tenderness in the epigastrium and the left lower quadrant, nonsurgical abdomen. Lungs are clear, heart is regular without murmurs, no peripheral edema or JVD.  EKG appears nonischemic but the patient will need cardiology consultation or admission to the hospitalist service to have further investigation as to the source of his symptoms as he has a good chance of having cardiovascular source of his pain.  I saw and evaluated the patient, reviewed the resident's note and I agree with the findings and plan.  I personally interpreted the EKG as well as the resident and agree with the interpretation on the resident's chart.   Vida Roller, MD 10/02/12 [phone]

## 2012-10-02 NOTE — ED Notes (Signed)
Pt c/o mid-sternum chest pain radiating to his back, abd pain, SOB for several weeks. Pt presents today with tremors to his Left arm. Pt seen here Saturday for the same

## 2012-10-02 NOTE — ED Notes (Signed)
Patient C/O Pain in his chest, abdomen and throat.  States that he was seen here Saturday for the same thing.  Also C/O pain in his left leg. States that when it goes up in this throat, he can hardly breathe.

## 2012-10-02 NOTE — H&P (Signed)
Cardiology History and Physical  HAMRICK,MAURA L, MD  History of Present Illness (and review of medical records): [name] [name] is a 72 y.o. male who presents for evaluation of chest pain.  He is followed by Dr. Jens Som with known hx of  LBBB, CAD s/p CABG 2001 with stenting to the LM/RCA/Cx since, carotid disease s/p CEA.  He last presented with chest pain 09/2011 and underwent cardiac catheterization revealing patent grafts.  He recently was seen in ED after being transferred from urgent care for chest pain this past Saturday.  He was ruled out and planned for follow up in clinic early next week.  He however presents again with chest pain.  He describes episode today of 10/10 chest pain, with radiation to the abdomen, left neck and jaw.  He took NTG at home with no relief.  He has also had shortness of breath and nausea.  This was similar to episode on Sat but at that time was 5/10.  He has had intermittent pain like this for some time now, but not this severe.  Pain can be associated with exertion and at rest.  Pain is also at times associated with eating.  He did not require and pain meds and is now free of pain.  Cardiac Catheterization 10/05/2011  Hemodynamics:  AO 146/71 (101)  LV 161/25  No gradient  Coronary angiography:  Coronary dominance: right  Left mainstem: There is tapered narrowing at the ostium of 30-40%, but no high grade obstruction. No change from the prior study. The distal LMCA is stented into the CFX and this is patent. The stent transitions normally without significant renarrowing.  Left anterior descending (LAD): The native LAD is occluded.  The LIMA to the LAD appears patent, with perhaps minimal ostial narrowing at the takeoff of the IMA from the subclavian. There is no drop in pressure throughout the subclavian vessel. The distal LAD appears small in caliber, but without critical narrowing. The vessel was non- selectively injected just outside of the ostium due to  angulation.  Left circumflex (LCx): Provides a large ramus with about 30% narrowing, with the take off through the stent. The av circ stent, crossing the OM1, leads in to the large OM2 and remains unchanged from the time of implantation, without significant narrowing. The OM1, which was pinched before, is unchanged and has about 50% ostial narrowing, but looks similar to the post procedure finding of 2011.  Right coronary artery (RCA): The ostium is stented and remains patent. There is probably 30% ostial narrowing. The RCA has diffuse 50-60% narrowing in the mid vessel, but this appears fairly smooth and largely unchanged. The distal vessel supplies an acute marginal, PDA, and PLA, all without significant focal narrowing.  The Radial graft to the diagonal remains intact and smooth with good runoff. Apparently, a distal limb to the OM is occluded.  Left ventriculography: Left ventricular systolic function is normal, LVEF is estimated at 50-55%, ?minimal anterolateral hypokinesis  Final Conclusions:  1. Continued patency of the IMA to the LAD  2. Continued patency of the radial to the diagonal  3. Continue patency of the LMCA and CFX stents with mild narrowing of the OM1 as in 2011  4. Continued patency of the ostial RCA stent as noted.  5. Preserved overall LV function  Review of Systems No fevers, chills, night sweats, no vomiting, no blood in stool or urine. Further review of systems was otherwise negative other than stated in HPI.  Patient Active Problem  List   Diagnosis Date Noted  . Unstable angina 10/06/2011  . Anemia 10/06/2011  . Enteritis 09/21/2011  . Abdominal pain 09/20/2011  . Chest pain 04/12/2011  . Memory loss 04/07/2011  . ANXIETY 02/01/2010  . ABDOMINAL PAIN-EPIGASTRIC 02/01/2010  . ADENOCARCINOMA, PROSTATE 12/22/2008  . DYSPHAGIA UNSPECIFIED 11/18/2008  . COLONIC POLYPS, HX OF 11/18/2008  . HYPERCHOLESTEROLEMIA, PURE 01/20/2008  . HYPERTENSION, BENIGN 01/20/2008  .  CAD, ARTERY BYPASS GRAFT 01/20/2008  . LBBB 01/20/2008  . CAROTID ARTERY STENOSIS, WITHOUT INFARCTION 01/20/2008  . GERD 01/20/2008  . CHEST PAIN, NON-CARDIAC 01/20/2008   Past Medical History  Diagnosis Date  . Hypertension   . Prostate cancer     s/p cryoablation  . Pancreatitis Dec 2011    ERCP ok.  . Hiatal hernia   . Coronary artery disease     a. S/p CABG in 2001. b. S/p DES to protected LM and BMS to RCA 2006. c. 12/2009: had CP with negative stress test with repeat cath s/p DES to Cx;  d. 09/2011 Cath: patent  RCA, LM, LCX stents, patent LIMA->LAD & Radial->Diag,EF 50-55%-->Med Rx.  . Chronic chest pain   . Left bundle branch block   . GERD (gastroesophageal reflux disease)   . Hyperlipidemia   . Inguinal hernia   . Statin intolerance   . Anxiety disorder   . Peripheral vascular disease 04/10/11    a. s/p left carotid endarterectomy 04/14/2011.  Marland Kitchen Esophageal stricture   . Renal cyst     Seen on CT 08/2011 also with circumferential bladder wall thickening  . Anginal pain   . Anemia 10/06/2011  . Colon polyp   . Hemorrhoids   . Diverticulosis   . Atrial fibrillation     Past Surgical History  Procedure Laterality Date  . Heart stents    . Cholecystectomy    . Inguinal hernia repair      bilateral  . Coronary artery bypass graft  2001  . Endarterectomy  04/14/2011    Procedure: ENDARTERECTOMY CAROTID;  Surgeon: Pryor Ochoa, MD;  Location: [facility] OR;  Service: Vascular;  Laterality: Left;  Would like to perform procedure first, at 0730  . Cardiac catheterization    . Coronary angioplasty    . Carotid endarterectomy    . Pr vein bypass graft,aorto-fem-pop      Prescriptions prior to admission  Medication Sig Dispense Refill  . amLODipine (NORVASC) 5 MG tablet Take 2.5 mg by mouth 2 (two) times daily.      Marland Kitchen aspirin EC 81 MG tablet Take 81 mg by mouth at bedtime.       . clopidogrel (PLAVIX) 75 MG tablet Take 75 mg by mouth daily.      . diphenhydrAMINE (BENADRYL) 25  MG tablet Take 25 mg by mouth at bedtime. sleep      . isosorbide dinitrate (ISORDIL) 20 MG tablet Take 20 mg by mouth 2 (two) times daily.      . metoCLOPramide (REGLAN) 10 MG tablet Take 5 mg by mouth 4 (four) times daily as needed. Stomach pain      . metoprolol (LOPRESSOR) 50 MG tablet Take 25 mg by mouth 2 (two) times daily.       . nitroGLYCERIN (NITROSTAT) 0.4 MG SL tablet Place 0.4 mg under the tongue every 5 (five) minutes as needed. For chest pain      . Omega-3 Fatty Acids (FISH OIL) 1000 MG CAPS Take 1 capsule by mouth 2 (two) times daily.      Marland Kitchen  pantoprazole (PROTONIX) 40 MG tablet Take 40 mg by mouth 2 (two) times daily.        Allergies  Allergen Reactions  . Amoxicillin-Pot Clavulanate     REACTION: 'Burns' Stomach  . Codeine Nausea And Vomiting  . Erythromycin Other (See Comments)    All mycins cause upset stomach  . Hydrocodone Nausea And Vomiting  . Morphine Nausea And Vomiting  . Nitrofuran Derivatives Other (See Comments)    unknown  . Oxycodone Hcl Nausea And Vomiting  . Shellfish Allergy Swelling  . Tramadol Nausea And Vomiting  . Zolpidem Tartrate Other (See Comments)    hallucinations    History  Substance Use Topics  . Smoking status: Never Smoker   . Smokeless tobacco: Current User    Types: Chew  . Alcohol Use: No    Family History  Problem Relation Age of Onset  . Heart disease Mother     Heart Disease before age 43  . Hypertension Mother   . Heart attack Mother   . Cancer Father   . Hypertension Sister   . Heart disease Sister     Heart Disease before age 9  . Cancer Sister   . Heart attack Sister   . Hypertension Brother   . Heart disease Brother     Heart Disease before age 71  . Heart attack Brother      Objective:  Patient Vitals for the past 8 hrs:  BP Temp Temp src Pulse Resp SpO2 Height Weight  10/02/12 2311 184/61 mmHg 97.6 F (36.4 C) Oral 65 20 100 % 5' 4.5" (1.638 m) 62.869 kg (138 lb 9.6 oz)  10/02/12 1851 165/63 mmHg  - - 70 17 100 % - -  10/02/12 1756 173/65 mmHg 97.8 F (36.6 C) Oral 77 20 99 % - -  10/02/12 1751 173/65 mmHg 97.8 F (36.6 C) Oral 77 20 99 % 5\' 4"  (1.626 m) 64.864 kg (143 lb)   General appearance: alert, cooperative, appears stated age and no distress Head: Normocephalic, without obvious abnormality, atraumatic Eyes: conjunctivae/corneas clear. PERRL, EOM's intact. Fundi benign. Neck: no JVD and supple, symmetrical, trachea midline Lungs: clear to auscultation bilaterally Chest wall: well healed midline scar Heart: regular rate and rhythm, S1, S2 normal,  Abdomen: soft, non-tender; bowel sounds normal;  Extremities: extremities normal, atraumatic, no cyanosis or edema Pulses: 2+ and symmetric Neurologic: Grossly normal  Results for orders placed during the hospital encounter of 10/02/12 (from the past 48 hour(s))  CBC     Status: None   Collection Time    10/02/12  6:06 PM      Result Value Range   WBC 6.6  4.0 - 10.5 K/uL   RBC 4.86  4.22 - 5.81 MIL/uL   Hemoglobin 14.5  13.0 - 17.0 g/dL   HCT 40.9  81.1 - 91.4 %   MCV 83.3  78.0 - 100.0 fL   MCH 29.8  26.0 - 34.0 pg   MCHC 35.8  30.0 - 36.0 g/dL   RDW 78.2  95.6 - 21.3 %   Platelets 195  150 - 400 K/uL  BASIC METABOLIC PANEL     Status: Abnormal   Collection Time    10/02/12  6:06 PM      Result Value Range   Sodium 136  135 - 145 mEq/L   Potassium 3.5  3.5 - 5.1 mEq/L   Chloride 98  96 - 112 mEq/L   CO2 26  19 - 32 mEq/L  Glucose, Bld 96  70 - 99 mg/dL   BUN 12  6 - 23 mg/dL   Creatinine, Ser 1.61  0.50 - 1.35 mg/dL   Calcium 09.6  8.4 - 04.5 mg/dL   GFR calc non Af Amer 66 (*) >90 mL/min   GFR calc Af Amer 76 (*) >90 mL/min   Comment: (NOTE)     The eGFR has been calculated using the CKD EPI equation.     This calculation has not been validated in all clinical situations.     eGFR's persistently <90 mL/min signify possible Chronic Kidney     Disease.  PRO B NATRIURETIC PEPTIDE     Status: Abnormal    Collection Time    10/02/12  6:06 PM      Result Value Range   Pro B Natriuretic peptide (BNP) 329.8 (*) 0 - 125 pg/mL  POCT I-STAT TROPONIN I     Status: None   Collection Time    10/02/12  6:16 PM      Result Value Range   Troponin i, poc 0.01  0.00 - 0.08 ng/mL   Comment 3            Comment: Due to the release kinetics of cTnI,     a negative result within the first hours     of the onset of symptoms does not rule out     myocardial infarction with certainty.     If myocardial infarction is still suspected,     repeat the test at appropriate intervals.   No results found.  ECG:  Sinus rhythm HR 76, nonspecific intraventricular conduction delay, occasional PVCs, RAD, axis has changed since prior (LAD), previous LBBB  Assessment: 62M hx of CAD s/p CABG with patent grafts on last cath 09/2010 presents with recurrent chest pain.  Plan:  1. Cardiology Admission  2. Continuous monitoring on Telemetry. 3. Repeat ekg on admit, prn chest pain or arrythmia 4. Trend cardiac biomarkers, check lipids, hgba1c, tsh 5. Medical management to include ASA, Plavix, BB, Imdur, NTG prn 6. NPO @ MN, consider further ischemic evaluation with functional study in am. 7. Discussed plan with patient and family at bedside.

## 2012-10-03 ENCOUNTER — Inpatient Hospital Stay (HOSPITAL_COMMUNITY): Payer: Medicare Other

## 2012-10-03 DIAGNOSIS — R0789 Other chest pain: Secondary | ICD-10-CM | POA: Diagnosis not present

## 2012-10-03 DIAGNOSIS — I4891 Unspecified atrial fibrillation: Secondary | ICD-10-CM | POA: Diagnosis not present

## 2012-10-03 DIAGNOSIS — I2510 Atherosclerotic heart disease of native coronary artery without angina pectoris: Secondary | ICD-10-CM | POA: Diagnosis not present

## 2012-10-03 DIAGNOSIS — I7779 Dissection of other artery: Secondary | ICD-10-CM | POA: Diagnosis not present

## 2012-10-03 DIAGNOSIS — R079 Chest pain, unspecified: Secondary | ICD-10-CM

## 2012-10-03 LAB — CBC
HCT: 32.2 % — ABNORMAL LOW (ref 39.0–52.0)
Hemoglobin: 10.9 g/dL — ABNORMAL LOW (ref 13.0–17.0)
MCH: 29.1 pg (ref 26.0–34.0)
MCHC: 33.9 g/dL (ref 30.0–36.0)
MCHC: 34.6 g/dL (ref 30.0–36.0)
MCV: 83.9 fL (ref 78.0–100.0)
Platelets: 160 10*3/uL (ref 150–400)
RBC: 3.83 MIL/uL — ABNORMAL LOW (ref 4.22–5.81)
RDW: 14 % (ref 11.5–15.5)

## 2012-10-03 LAB — LIPID PANEL
Cholesterol: 223 mg/dL — ABNORMAL HIGH (ref 0–200)
HDL: 30 mg/dL — ABNORMAL LOW (ref >39)
Total CHOL/HDL Ratio: 7.4 RATIO
Triglycerides: 217 mg/dL — ABNORMAL HIGH (ref <150)
VLDL: 43 mg/dL — ABNORMAL HIGH (ref 0–40)

## 2012-10-03 LAB — BASIC METABOLIC PANEL
BUN: 11 mg/dL (ref 6–23)
CO2: 27 mEq/L (ref 19–32)
GFR calc non Af Amer: 63 mL/min — ABNORMAL LOW (ref >90)
Glucose, Bld: 101 mg/dL — ABNORMAL HIGH (ref 70–99)
Potassium: 3.9 mEq/L (ref 3.5–5.1)
Sodium: 138 mEq/L (ref 135–145)

## 2012-10-03 LAB — TSH: TSH: 3.462 u[IU]/mL (ref 0.350–4.500)

## 2012-10-03 LAB — MAGNESIUM: Magnesium: 1.9 mg/dL (ref 1.5–2.5)

## 2012-10-03 LAB — PROTIME-INR: INR: 1.03 (ref 0.00–1.49)

## 2012-10-03 MED ORDER — METOCLOPRAMIDE HCL 5 MG PO TABS
5.0000 mg | ORAL_TABLET | Freq: Four times a day (QID) | ORAL | Status: DC | PRN
  Administered 2012-10-03: 5 mg via ORAL
  Filled 2012-10-03: qty 1

## 2012-10-03 MED ORDER — DIPHENHYDRAMINE HCL 25 MG PO TABS
25.0000 mg | ORAL_TABLET | Freq: Every day | ORAL | Status: DC
  Administered 2012-10-03 – 2012-10-04 (×3): 25 mg via ORAL
  Filled 2012-10-03 (×4): qty 1

## 2012-10-03 MED ORDER — PANTOPRAZOLE SODIUM 40 MG PO TBEC
40.0000 mg | DELAYED_RELEASE_TABLET | Freq: Two times a day (BID) | ORAL | Status: DC
  Administered 2012-10-03 – 2012-10-05 (×6): 40 mg via ORAL
  Filled 2012-10-03 (×5): qty 1

## 2012-10-03 MED ORDER — ONDANSETRON HCL 4 MG/2ML IJ SOLN
4.0000 mg | Freq: Four times a day (QID) | INTRAMUSCULAR | Status: DC | PRN
  Administered 2012-10-04: 4 mg via INTRAVENOUS
  Filled 2012-10-03: qty 2

## 2012-10-03 MED ORDER — TECHNETIUM TC 99M SESTAMIBI - CARDIOLITE
30.0000 | Freq: Once | INTRAVENOUS | Status: AC | PRN
  Administered 2012-10-03: 30 via INTRAVENOUS

## 2012-10-03 MED ORDER — SODIUM CHLORIDE 0.9 % IV SOLN
INTRAVENOUS | Status: AC
  Administered 2012-10-03: 01:00:00 via INTRAVENOUS

## 2012-10-03 MED ORDER — AMLODIPINE BESYLATE 2.5 MG PO TABS
2.5000 mg | ORAL_TABLET | Freq: Two times a day (BID) | ORAL | Status: DC
Start: 2012-10-03 — End: 2012-10-05
  Administered 2012-10-03 – 2012-10-05 (×6): 2.5 mg via ORAL
  Filled 2012-10-03 (×7): qty 1

## 2012-10-03 MED ORDER — METOPROLOL TARTRATE 25 MG PO TABS
25.0000 mg | ORAL_TABLET | Freq: Two times a day (BID) | ORAL | Status: DC
  Administered 2012-10-03 – 2012-10-05 (×6): 25 mg via ORAL
  Filled 2012-10-03 (×7): qty 1

## 2012-10-03 MED ORDER — NITROGLYCERIN 0.4 MG SL SUBL
0.4000 mg | SUBLINGUAL_TABLET | SUBLINGUAL | Status: DC | PRN
  Filled 2012-10-03: qty 25

## 2012-10-03 MED ORDER — ACETAMINOPHEN 325 MG PO TABS
650.0000 mg | ORAL_TABLET | ORAL | Status: DC | PRN
  Administered 2012-10-03: 650 mg via ORAL
  Filled 2012-10-03: qty 2

## 2012-10-03 MED ORDER — ISOSORBIDE DINITRATE 20 MG PO TABS
20.0000 mg | ORAL_TABLET | Freq: Two times a day (BID) | ORAL | Status: DC
  Administered 2012-10-03 – 2012-10-05 (×6): 20 mg via ORAL
  Filled 2012-10-03 (×7): qty 1

## 2012-10-03 MED ORDER — OMEGA-3-ACID ETHYL ESTERS 1 G PO CAPS
1.0000 g | ORAL_CAPSULE | Freq: Every day | ORAL | Status: DC
  Administered 2012-10-03 – 2012-10-05 (×2): 1 g via ORAL
  Filled 2012-10-03 (×3): qty 1

## 2012-10-03 MED ORDER — CLOPIDOGREL BISULFATE 75 MG PO TABS
75.0000 mg | ORAL_TABLET | Freq: Every day | ORAL | Status: DC
  Administered 2012-10-03 – 2012-10-05 (×3): 75 mg via ORAL
  Filled 2012-10-03 (×3): qty 1

## 2012-10-03 MED ORDER — REGADENOSON 0.4 MG/5ML IV SOLN
0.4000 mg | Freq: Once | INTRAVENOUS | Status: AC
  Administered 2012-10-03: 0.4 mg via INTRAVENOUS

## 2012-10-03 MED ORDER — TECHNETIUM TC 99M SESTAMIBI GENERIC - CARDIOLITE
10.0000 | Freq: Once | INTRAVENOUS | Status: AC | PRN
  Administered 2012-10-03: 10 via INTRAVENOUS

## 2012-10-03 MED ORDER — ASPIRIN EC 81 MG PO TBEC
81.0000 mg | DELAYED_RELEASE_TABLET | Freq: Every day | ORAL | Status: DC
  Administered 2012-10-03 – 2012-10-05 (×2): 81 mg via ORAL
  Filled 2012-10-03 (×3): qty 1

## 2012-10-03 MED ORDER — HEPARIN SODIUM (PORCINE) 5000 UNIT/ML IJ SOLN
5000.0000 [IU] | Freq: Three times a day (TID) | INTRAMUSCULAR | Status: DC
  Administered 2012-10-03 – 2012-10-05 (×6): 5000 [IU] via SUBCUTANEOUS
  Filled 2012-10-03 (×11): qty 1

## 2012-10-03 NOTE — Progress Notes (Signed)
   Subjective:  Denies CP or dyspnea; denies melena or hematochezia.   Objective:  Filed Vitals:   10/02/12 1756 10/02/12 1851 10/02/12 2311 10/03/12 0455  BP: 173/65 165/63 184/61 101/53  Pulse: 77 70 65 60  Temp: 97.8 F (36.6 C)  97.6 F (36.4 C) 97.9 F (36.6 C)  TempSrc: Oral  Oral Oral  Resp: 20 17 20 18   Height:   5' 4.5" (1.638 m)   Weight:   138 lb 9.6 oz (62.869 kg)   SpO2: 99% 100% 100% 98%    Intake/Output from previous day:  Intake/Output Summary (Last 24 hours) at 10/03/12 0758 Last data filed at 10/03/12 0455  Gross per 24 hour  Intake    240 ml  Output    150 ml  Net     90 ml    Physical Exam: Physical exam: Well-developed well-nourished in no acute distress.  Skin is warm and dry.  HEENT is normal.  Neck is supple. Chest is clear to auscultation with normal expansion.  Cardiovascular exam is regular rate and rhythm.  Abdominal exam No masses palpated. Mild tenderness to palpation Extremities show no edema. neuro grossly intact    Lab Results: Basic Metabolic Panel:  Recent Labs  30/86/57 1806 10/03/12 0543  NA 136 138  K 3.5 3.9  CL 98 103  CO2 26 27  GLUCOSE 96 101*  BUN 12 11  CREATININE 1.09 1.13  CALCIUM 10.1 8.5  MG  --  1.9   CBC:  Recent Labs  10/02/12 1806 10/03/12 0543  WBC 6.6 4.5  HGB 14.5 10.9*  HCT 40.5 32.2*  MCV 83.3 84.1  PLT 195 155   Cardiac Enzymes:  Recent Labs  10/03/12 0200  TROPONINI <0.30     Assessment/Plan:  1 chest pain-patient is extremely difficult to evaluate. He states he has had intermittent chest pain since age 72. He has chest pain on a daily basis and has had "for years". He also has significant coronary disease. His enzymes are negative. His electrocardiogram shows a left bundle branch block which is unchanged. Plan nuclear study for risk stratification. 2 anemia-followup hemoglobin this morning markedly decreased compared to yesterday. He is not having evidence of active  bleeding. Question lab error. Repeat hemoglobin. If it remains decreased he will need further evaluation. Continue Protonix. 3 hypertension-continue present medications. Follow blood pressure and adjust as needed.   Olga Millers 10/03/2012, 7:58 AM

## 2012-10-04 ENCOUNTER — Encounter (HOSPITAL_COMMUNITY)
Admission: EM | Disposition: A | Discharge status: Home / Self Care | Payer: Self-pay | Source: Home / Self Care | Attending: Internal Medicine

## 2012-10-04 DIAGNOSIS — I2510 Atherosclerotic heart disease of native coronary artery without angina pectoris: Secondary | ICD-10-CM | POA: Diagnosis not present

## 2012-10-04 HISTORY — PX: LEFT HEART CATHETERIZATION WITH CORONARY/GRAFT ANGIOGRAM: SHX5450

## 2012-10-04 LAB — CBC
MCH: 29.3 pg (ref 26.0–34.0)
MCHC: 35.4 g/dL (ref 30.0–36.0)
Platelets: 169 10*3/uL (ref 150–400)
RDW: 13.8 % (ref 11.5–15.5)

## 2012-10-04 LAB — LIPASE, BLOOD: Lipase: 51 U/L (ref 11–59)

## 2012-10-04 LAB — CREATININE, SERUM: Creatinine, Ser: 1.04 mg/dL (ref 0.50–1.35)

## 2012-10-04 SURGERY — LEFT HEART CATHETERIZATION WITH CORONARY/GRAFT ANGIOGRAM
Anesthesia: LOCAL

## 2012-10-04 MED ORDER — LIDOCAINE HCL (PF) 1 % IJ SOLN
INTRAMUSCULAR | Status: AC
  Filled 2012-10-04: qty 30

## 2012-10-04 MED ORDER — SODIUM CHLORIDE 0.9 % IV SOLN
INTRAVENOUS | Status: DC
  Administered 2012-10-04: 11:00:00 via INTRAVENOUS

## 2012-10-04 MED ORDER — NITROGLYCERIN 0.2 MG/ML ON CALL CATH LAB
INTRAVENOUS | Status: AC
  Filled 2012-10-04: qty 1

## 2012-10-04 MED ORDER — FAMOTIDINE IN NACL 20-0.9 MG/50ML-% IV SOLN
20.0000 mg | INTRAVENOUS | Status: DC
  Filled 2012-10-04: qty 50

## 2012-10-04 MED ORDER — METHYLPREDNISOLONE SODIUM SUCC 125 MG IJ SOLR
125.0000 mg | INTRAMUSCULAR | Status: AC
  Administered 2012-10-04: 125 mg via INTRAVENOUS
  Filled 2012-10-04: qty 2

## 2012-10-04 MED ORDER — HEPARIN SODIUM (PORCINE) 5000 UNIT/ML IJ SOLN: 5000.0000 [IU] | Freq: Three times a day (TID) | INTRAMUSCULAR | Status: DC

## 2012-10-04 MED ORDER — MIDAZOLAM HCL 2 MG/2ML IJ SOLN
INTRAMUSCULAR | Status: AC
  Filled 2012-10-04: qty 2

## 2012-10-04 MED ORDER — FENTANYL CITRATE 0.05 MG/ML IJ SOLN
INTRAMUSCULAR | Status: AC
  Filled 2012-10-04: qty 2

## 2012-10-04 MED ORDER — ACETAMINOPHEN 325 MG PO TABS: 650.0000 mg | ORAL_TABLET | ORAL | Status: DC | PRN

## 2012-10-04 MED ORDER — DIPHENHYDRAMINE HCL 50 MG/ML IJ SOLN
25.0000 mg | INTRAMUSCULAR | Status: AC
  Administered 2012-10-04: 25 mg via INTRAVENOUS
  Filled 2012-10-04: qty 1

## 2012-10-04 MED ORDER — HEPARIN (PORCINE) IN NACL 2-0.9 UNIT/ML-% IJ SOLN
INTRAMUSCULAR | Status: AC
  Filled 2012-10-04: qty 1000

## 2012-10-04 MED ORDER — ASPIRIN 81 MG PO CHEW: 324.0000 mg | CHEWABLE_TABLET | ORAL | Status: DC

## 2012-10-04 MED ORDER — ONDANSETRON HCL 4 MG/2ML IJ SOLN: 4.0000 mg | Freq: Four times a day (QID) | INTRAMUSCULAR | Status: DC | PRN

## 2012-10-04 MED ORDER — SODIUM CHLORIDE 0.9 % IV SOLN
INTRAVENOUS | Status: AC
  Administered 2012-10-04: 75 mL/h via INTRAVENOUS

## 2012-10-04 MED ORDER — ASPIRIN 81 MG PO CHEW
324.0000 mg | CHEWABLE_TABLET | ORAL | Status: AC
  Administered 2012-10-04: 324 mg via ORAL
  Filled 2012-10-04: qty 4

## 2012-10-04 NOTE — Care Management Note (Unsigned)
    Page 1 of 1   10/04/2012     5:15:27 PM   CARE MANAGEMENT NOTE 10/04/2012  Patient:  [name] [name], [name] [name]   Account Number:  [id number]  Date Initiated:  10/04/2012  Documentation initiated by:  Jazsmine Macari  Subjective/Objective Assessment:   PT ADM ON 10/02/12 WITH CHEST PAIN.  PTA, PT INDEPENDENT OF ADLS.     Action/Plan:   WILL FOLLOW FOR HOME NEEDS AS PT PROGRESSES.   Anticipated DC Date:  10/05/2012   Anticipated DC Plan:  HOME/SELF CARE      DC Planning Services  CM consult      Choice offered to / List presented to:             Status of service:  In process, will continue to follow Medicare Important Message given?   (If response is "NO", the following Medicare IM given date fields will be blank) Date Medicare IM given:   Date Additional Medicare IM given:    Discharge Disposition:    Per UR Regulation:  Reviewed for med. necessity/level of care/duration of stay  If discussed at Long Length of Stay Meetings, dates discussed:    Comments:

## 2012-10-04 NOTE — CV Procedure (Signed)
Cardiac Cath Procedure Note:  Indication: Chest pain  Procedures performed:  1) Selective coronary angiography 2) Left heart catheterization 3) Left ventriculogram  Description of procedure:   The risks and indication of the procedure were explained. Consent was signed and placed on the chart. An appropriate timeout was taken prior to the procedure. The right groin was prepped and draped in the routine sterile fashion and anesthetized with 1% local lidocaine.   A 5 FR arterial sheath was placed in the right femoral artery using a modified Seldinger technique. Standard catheters including a JL3.5, JR4, IM catheter and angled pigtail were used. All catheter exchanges were made over a wire.  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 This was asymptomatic. L subclavian angiography confirmed good flow in both vessels. BP in L arm was unchanged.   Findings:  Ao Pressure: 138/64 (94) LV Pressure:  152/10/19 There was no signficant gradient across the aortic valve on pullback.  Coronary angiography:  Coronary dominance: right   Left mainstem: There is tapered narrowing at the ostium of 30, but no high grade obstruction. No change from the prior study. The distal LMCA is stented into the CFX and this is patent. The stent transitions normally without significant renarrowing.   Left anterior descending (LAD): The native LAD is occluded.   The LIMA to the LAD was shot nonselectively due to the angle of the vessel and subclavian dissection. The LIMA appears widely patent.    Left circumflex (LCx): Provides a large ramus with about 30% narrowing, with the take off through the stent. The av circ stent, crossing the OM1, leads in to the large OM2 and remains unchanged from the time of implantation. There is 305 stenosis in mid AV groove LCX. The OM1, which was pinched before, is unchanged and has about 50% ostial narrowing, but looks similar to the post procedure  finding of 2011.   Right coronary artery (RCA): The ostium is stented and remains patent. . The RCA has diffuse 50-60% narrowing in the mid vesse just after the stentl, but this appears fairly smooth and largely unchanged. The distal vessel supplies an acute marginal, PDA, and PLA, all without significant focal narrowing.    The Radial graft to the diagonal remains intact and smooth with good runoff. Apparently, a distal limb to the OM is occluded. Which is chronic.  Left ventriculography by hand injection: LVEF is estimated at 55% no obvious wall motion abnormality  Final Conclusions:  1. Continued patency of the IMA to the LAD  2. Continued patency of the radial to the diagonal  3. Continue patency of the LMCA and CFX stents with mild narrowing of the OM1 as in 2011  4. Continued patency of the ostial RCA stent as noted.  5. Preserved overall LV function 6. 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 Revascularization otherwise patent. There was partial dissection of L diagonal and L vertebral during case with no obstruction of flow on final angiography. Will follow with serial BPs overnight. If OK, home in am.   Arvilla Meres 3:16 PM

## 2012-10-04 NOTE — Progress Notes (Signed)
   Subjective:  Had chest pain last PM; no dyspnea.   Objective:  Filed Vitals:   10/03/12 1050 10/03/12 1348 10/03/12 2030 10/04/12 0401  BP: 137/58 108/48 118/57 102/51  Pulse: 65 65 67 62  Temp:  97.6 F (36.4 C) 98.5 F (36.9 C) 98.1 F (36.7 C)  TempSrc:  Oral Oral Oral  Resp:  18 18 18  Height:      Weight:      SpO2:  100% 99% 98%    Intake/Output from previous day:  Intake/Output Summary (Last 24 hours) at 10/04/12 0823 Last data filed at 10/03/12 2030  Gross per 24 hour  Intake    600 ml  Output    550 ml  Net     50 ml    Physical Exam: Physical exam: Well-developed well-nourished in no acute distress.  Skin is warm and dry.  HEENT is normal.  Neck is supple. Chest is clear to auscultation with normal expansion.  Cardiovascular exam is regular rate and rhythm.  Abdominal exam No masses palpated. Mild tenderness to palpation Extremities show no edema. neuro grossly intact    Lab Results: Basic Metabolic Panel:  Recent Labs  10/02/12 1806 10/03/12 0543  NA 136 138  K 3.5 3.9  CL 98 103  CO2 26 27  GLUCOSE 96 101*  BUN 12 11  CREATININE 1.09 1.13  CALCIUM 10.1 8.5  MG  --  1.9   CBC:  Recent Labs  10/03/12 0543 10/03/12 1220  WBC 4.5 4.4  HGB 10.9* 12.3*  HCT 32.2* 35.5*  MCV 84.1 83.9  PLT 155 160   Cardiac Enzymes:  Recent Labs  10/03/12 0200 10/03/12 0920  TROPONINI <0.30 <0.30     Assessment/Plan:  1 chest pain-patient is extremely difficult to evaluate. He states he has had intermittent chest pain since age 72. He has chest pain on a daily basis and has had "for years". He also has significant coronary disease. His enzymes are negative. His electrocardiogram shows a left bundle branch block which is unchanged. Nuclear study mildly abnormal; recurrent symptoms last PM; proceed with cath (risks and benefits discussed and patient agrees to proceed); will give solumedrol precath for ? Dye allergy although he cannot recall  reaction. Check lipase. 2 anemia-follow Hgb as outpt; improved this AM. Continue Protonix. 3 hypertension-continue present medications. Follow blood pressure and adjust as needed.   Keyarra Rendall 10/04/2012, 8:23 AM    

## 2012-10-04 NOTE — H&P (View-Only) (Signed)
   Subjective:  Had chest pain last PM; no dyspnea.   Objective:  Filed Vitals:   10/03/12 1050 10/03/12 1348 10/03/12 2030 10/04/12 0401  BP: 137/58 108/48 118/57 102/51  Pulse: 65 65 67 62  Temp:  97.6 F (36.4 C) 98.5 F (36.9 C) 98.1 F (36.7 C)  TempSrc:  Oral Oral Oral  Resp:  18 18 18   Height:      Weight:      SpO2:  100% 99% 98%    Intake/Output from previous day:  Intake/Output Summary (Last 24 hours) at 10/04/12 9604 Last data filed at 10/03/12 2030  Gross per 24 hour  Intake    600 ml  Output    550 ml  Net     50 ml    Physical Exam: Physical exam: Well-developed well-nourished in no acute distress.  Skin is warm and dry.  HEENT is normal.  Neck is supple. Chest is clear to auscultation with normal expansion.  Cardiovascular exam is regular rate and rhythm.  Abdominal exam No masses palpated. Mild tenderness to palpation Extremities show no edema. neuro grossly intact    Lab Results: Basic Metabolic Panel:  Recent Labs  54/09/81 1806 10/03/12 0543  NA 136 138  K 3.5 3.9  CL 98 103  CO2 26 27  GLUCOSE 96 101*  BUN 12 11  CREATININE 1.09 1.13  CALCIUM 10.1 8.5  MG  --  1.9   CBC:  Recent Labs  10/03/12 0543 10/03/12 1220  WBC 4.5 4.4  HGB 10.9* 12.3*  HCT 32.2* 35.5*  MCV 84.1 83.9  PLT 155 160   Cardiac Enzymes:  Recent Labs  10/03/12 0200 10/03/12 0920  TROPONINI <0.30 <0.30     Assessment/Plan:  1 chest pain-patient is extremely difficult to evaluate. He states he has had intermittent chest pain since age 20. He has chest pain on a daily basis and has had "for years". He also has significant coronary disease. His enzymes are negative. His electrocardiogram shows a left bundle branch block which is unchanged. Nuclear study mildly abnormal; recurrent symptoms last PM; proceed with cath (risks and benefits discussed and patient agrees to proceed); will give solumedrol precath for ? Dye allergy although he cannot recall  reaction. Check lipase. 2 anemia-follow Hgb as outpt; improved this AM. Continue Protonix. 3 hypertension-continue present medications. Follow blood pressure and adjust as needed.   Olga Millers 10/04/2012, 8:23 AM

## 2012-10-04 NOTE — ED Provider Notes (Signed)
I saw and evaluated the patient, reviewed the resident's note and I agree with the findings and plan.  Please see my separate note regarding my evaluation of the patient.  Clinical Impression:  Chest Pain    Vida Roller, MD 10/04/12 [phone]

## 2012-10-04 NOTE — Progress Notes (Signed)
Pt having nausea, administered IV Zofran per PRN orders.

## 2012-10-04 NOTE — Interval H&P Note (Signed)
Cath Lab Visit (complete for each Cath Lab visit)  Clinical Evaluation Leading to the Procedure:   ACS: yes  Non-ACS:    Anginal Classification: CCS IV  Anti-ischemic medical therapy: Maximal Therapy (2 or more classes of medications)  Non-Invasive Test Results: Intermediate-risk stress test findings: cardiac mortality 1-3%/year  Prior CABG: Previous CABG      History and Physical Interval Note:  10/04/2012 2:27 PM  [name] [name]  has presented today for surgery, with the diagnosis of cp  The various methods of treatment have been discussed with the patient and family. After consideration of risks, benefits and other options for treatment, the patient has consented to  Procedure(s): LEFT HEART CATHETERIZATION WITH CORONARY/GRAFT ANGIOGRAM (N/A) as a surgical intervention .  The patient's history has been reviewed, patient examined, no change in status, stable for surgery.  I have reviewed the patient's chart and labs.  Questions were answered to the patient's satisfaction.     Carmeline Kowal

## 2012-10-05 DIAGNOSIS — I2510 Atherosclerotic heart disease of native coronary artery without angina pectoris: Secondary | ICD-10-CM | POA: Diagnosis not present

## 2012-10-05 NOTE — Progress Notes (Signed)
Patient ID: [name] [name], male   DOB: 09/16/1940, 72 y.o.   MRN: [id number]   SUBJECTIVE: Patient is stable this morning. He mentions that he ate breakfast and then walked in the hall. Once again he had some of the discomfort in his chest and his abdomen. This is the same sensation he had that led to his heart catheterization. He thinks it may be related to food intake. His catheterization showed that his coronaries were normal. It is outlined that there was a slight dissection but this was completely stable. He is stable to go home today.   Filed Vitals:   10/04/12 1800 10/04/12 1900 10/04/12 2040 10/05/12 0348  BP: 135/74 119/60 123/62 124/74  Pulse: 80 77 85 76  Temp:   98 F (36.7 C) 98.6 F (37 C)  TempSrc:   Oral Oral  Resp:   17 18  Height:      Weight:    137 lb 9.1 oz (62.4 kg)  SpO2:   97% 96%    Intake/Output Summary (Last 24 hours) at 10/05/12 0929 Last data filed at 10/05/12 0129  Gross per 24 hour  Intake      0 ml  Output    750 ml  Net   -750 ml    LABS: Basic Metabolic Panel:  Recent Labs  40/98/11 1806 10/03/12 0543 10/04/12 2027  NA 136 138  --   K 3.5 3.9  --   CL 98 103  --   CO2 26 27  --   GLUCOSE 96 101*  --   BUN 12 11  --   CREATININE 1.09 1.13 1.04  CALCIUM 10.1 8.5  --   MG  --  1.9  --    Liver Function Tests: No results found for this basename: AST, ALT, ALKPHOS, BILITOT, PROT, ALBUMIN,  in the last 72 hours  Recent Labs  10/04/12 1103  LIPASE 51   CBC:  Recent Labs  10/03/12 1220 10/04/12 2027  WBC 4.4 5.1  HGB 12.3* 12.6*  HCT 35.5* 35.6*  MCV 83.9 82.8  PLT 160 169   Cardiac Enzymes:  Recent Labs  10/03/12 0200 10/03/12 0920  TROPONINI <0.30 <0.30   BNP: No components found with this basename: POCBNP,  D-Dimer: No results found for this basename: DDIMER,  in the last 72 hours Hemoglobin A1C: No results found for this basename: HGBA1C,  in the last 72 hours Fasting Lipid Panel:  Recent Labs   10/03/12 0543  CHOL 223*  HDL 30*  LDLCALC 150*  TRIG 217*  CHOLHDL 7.4   Thyroid Function Tests:  Recent Labs  10/03/12 0200  TSH 3.462    RADIOLOGY: Dg Chest 2 View  09/28/2012   *RADIOLOGY REPORT*  Clinical Data: Chest pain for 1 week.  Hiatal hernia and shortness of breath.  Hypertension.  CHEST - 2 VIEW  Comparison: 10/04/2011  Findings: Prior median sternotomy. Midline trachea.  Mild cardiomegaly with a tortuous thoracic aorta. No pleural effusion or pneumothorax.  No congestive failure.  Clear lungs.  Cholecystectomy.  IMPRESSION: Cardiomegaly without congestive failure.   Original Report Authenticated By: Jeronimo Greaves, M.D.   Nm Myocar Multi W/spect W/wall Motion / Ef  10/03/2012   The patient underwent Lexiscan myoview stress testing under the supervision of the [facility] cardiology staff. Resting EKG shows LBBB. No change in EKG with Lexiscan stress. With stress, chest pain which was 1/10 at rest increased to 3/10 with stress.  The quality of the images is  satisfactory.  Perfusion images show a small area of decreased perfusion in the apical anteroseptal and the mid anteroseptal segments with partial reversibility. SDS=5.  Wall motion analysis reveals EDV of 78 ml and ESV of 31 ml. The overall LV ejection fraction is 61%. There is decreased septal motion consistent with the LBBB.  Impression: Small area of suspected scar with partial reversibility involving the apical anteroseptal and midanteroseptal segments.  Thomas A. Brackbill MD   Original Report Authenticated By: Cassell Clement, M.D.    PHYSICAL EXAM  patient is oriented to person time and place. Affect is normal. Cardiac exam reveals S1 and S2. The rhythm is regular. The cath site in his right groin is stable. There is no hematoma.   TELEMETRY: I have reviewed telemetry today October 05, 2012. There is normal sinus rhythm.   ASSESSMENT AND PLAN:    GERD     It is possible that his symptoms are related to his GERD. He  is on medications for this.    CAD (coronary artery disease)     Coronary disease is stable as outlined by the cath done yesterday. He had a slight dissection which was felt to be minor. His blood pressure has been stable. He is stable for discharge home today.  Willa Rough 10/05/2012 9:29 AM

## 2012-10-05 NOTE — Discharge Summary (Signed)
Physician Discharge Summary  Patient ID: [name] [name] MRN: [id number] DOB/AGE: [birth year] 72 y.o.  Admit date: 10/02/2012 Discharge date: 10/05/2012  Primary Discharge Diagnosis:  1.Noncardiac Chest Pain  Secondary Discharge Diagnosis: 1. CAD: S/P CABG and stent placement to the LMCA, RCA and CX in 2011 2. GERD Significant Diagnostic Studies:  1. Cardiac Cath 10/05/2012 -[facility] Final Conclusions:  1. Continued patency of the IMA to the LAD  2. Continued patency of the radial to the diagonal  3. Continue patency of the LMCA and CFX stents with mild narrowing of the OM1 as in 2011  4. Continued patency of the ostial RCA stent as noted.  5. Preserved overall LV function  2. Lexiscan Myoview 10/03/2012 Impression: Small area of suspected scar with partial reversibility involving the apical anteroseptal and midanteroseptal segments.   Hospital Course: [name] [name] is a  72 year old patient admitted on 10/02/2012 with recurrent chest discomfort. Cardiac enzymes are found be negative. EKG reveals no acute coronary syndrome. The patient has a history of CAD status post CABG in 2001 with stenting to the left main, RCA, and circumflex. As well as carotid disease status post CEA. Patient underwent Lexiscan Myoview revealing a small area of suspected scar and partial reversibility involving the apical anterior septal and mid anterior septal segments. As a result of this cardiac catheterization was completed revealing continued patency of grafts and stents.    The patient continued to have some discomfort in his abdomen and chest after eating. He also noticed that when he was up walking around in the hall day of discharge. The patient was seen and examined by Dr. Willa Rough and found to be stable. No medication changes are made. He will follow up with Dr. Jens Som in a couple weeks post hospitalization for ongoing assessment. Consideration for GI evaluation should be addressed on  followup.   Discharge Exam: Blood pressure 113/88, pulse 75, temperature 98.6 F (37 C), temperature source Oral, resp. rate 18, height 5' 4.5" (1.638 m), weight 137 lb 9.1 oz (62.4 kg), SpO2 96.00%.  Labs:   Lab Results  Component Value Date   WBC 5.1 10/04/2012   HGB 12.6* 10/04/2012   HCT 35.6* 10/04/2012   MCV 82.8 10/04/2012   PLT 169 10/04/2012    Recent Labs Lab 09/28/12 1346  10/03/12 0543 10/04/12 2027  NA 138  < > 138  --   K 4.3  < > 3.9  --   CL 103  < > 103  --   CO2 26  < > 27  --   BUN 15  < > 11  --   CREATININE 1.05  < > 1.13 1.04  CALCIUM 9.1  < > 8.5  --   PROT 6.5  --   --   --   BILITOT 1.0  --   --   --   ALKPHOS 51  --   --   --   ALT 14  --   --   --   AST 17  --   --   --   GLUCOSE 99  < > 101*  --   < > = values in this interval not displayed. Lab Results  Component Value Date   CKTOTAL 123 04/15/2011   CKMB 3.4 04/15/2011   TROPONINI <0.30 10/03/2012         Radiology: Dg Chest 2 View  09/28/2012   *RADIOLOGY REPORT*  Clinical Data: Chest pain for 1 week.  Hiatal hernia and shortness  of breath.  Hypertension.  CHEST - 2 VIEW  Comparison: 10/04/2011  Findings: Prior median sternotomy. Midline trachea.  Mild cardiomegaly with a tortuous thoracic aorta. No pleural effusion or pneumothorax.  No congestive failure.  Clear lungs.  Cholecystectomy.  IMPRESSION: Cardiomegaly without congestive failure.   Original Report Authenticated By: Jeronimo Greaves, M.D.    EKG:NSR with LBBB 64 bpm  FOLLOW UP PLANS AND APPOINTMENTS Discharge Orders   Future Appointments Provider Department Dept Phone   10/16/2012 12:10 PM Beatrice Lecher, PA-C [facility] [place]) [phone]   12/03/2012 3:00 PM [facility] [phone]   12/03/2012 4:00 PM Carma Lair Nickel, NP [facility] [phone]   Future Orders Complete By Expires   Diet - low sodium heart healthy  As directed     Increase activity slowly  As directed        Medication List         amLODipine 5 MG tablet  Commonly known as:  NORVASC  Take 2.5 mg by mouth 2 (two) times daily.     aspirin EC 81 MG tablet  Take 81 mg by mouth at bedtime.     clopidogrel 75 MG tablet  Commonly known as:  PLAVIX  Take 75 mg by mouth daily.     diphenhydrAMINE 25 MG tablet  Commonly known as:  BENADRYL  Take 25 mg by mouth at bedtime. sleep     Fish Oil 1000 MG Caps  Take 1 capsule by mouth 2 (two) times daily.     isosorbide dinitrate 20 MG tablet  Commonly known as:  ISORDIL  Take 20 mg by mouth 2 (two) times daily.     metoCLOPramide 10 MG tablet  Commonly known as:  REGLAN  Take 5 mg by mouth 4 (four) times daily as needed. Stomach pain     metoprolol 50 MG tablet  Commonly known as:  LOPRESSOR  Take 25 mg by mouth 2 (two) times daily.     nitroGLYCERIN 0.4 MG SL tablet  Commonly known as:  NITROSTAT  Place 0.4 mg under the tongue every 5 (five) minutes as needed. For chest pain     pantoprazole 40 MG tablet  Commonly known as:  PROTONIX  Take 40 mg by mouth 2 (two) times daily.           Follow-up Information   Follow up with Olga Millers, MD. (Our office will call you for appt in 2 weeks.)    Specialty:  Cardiology   Contact information:   [address] [place] [address] [phone]         Time spent with patient to include physician time: 35 minutes. Signed: Joni Reining 10/05/2012, 10:05 AM Co-Sign MD Patient seen and examined. I agree with the assessment and plan as detailed above. See also my additional thoughts below.   Refer also to my progress note from today. I made the decision for the patient to go home. I agree with all of the plans outlined above.  Willa Rough, MD, [facility] 10/05/2012 10:40 AM

## 2012-10-06 DIAGNOSIS — I447 Left bundle-branch block, unspecified: Secondary | ICD-10-CM | POA: Diagnosis not present

## 2012-10-06 DIAGNOSIS — K5730 Diverticulosis of large intestine without perforation or abscess without bleeding: Secondary | ICD-10-CM | POA: Diagnosis not present

## 2012-10-06 DIAGNOSIS — I25810 Atherosclerosis of coronary artery bypass graft(s) without angina pectoris: Secondary | ICD-10-CM | POA: Diagnosis not present

## 2012-10-06 DIAGNOSIS — Z885 Allergy status to narcotic agent status: Secondary | ICD-10-CM | POA: Diagnosis not present

## 2012-10-06 DIAGNOSIS — Z86010 Personal history of colonic polyps: Secondary | ICD-10-CM | POA: Diagnosis not present

## 2012-10-06 DIAGNOSIS — R1032 Left lower quadrant pain: Secondary | ICD-10-CM | POA: Diagnosis not present

## 2012-10-06 DIAGNOSIS — D649 Anemia, unspecified: Secondary | ICD-10-CM | POA: Diagnosis not present

## 2012-10-06 DIAGNOSIS — N281 Cyst of kidney, acquired: Secondary | ICD-10-CM | POA: Diagnosis not present

## 2012-10-06 DIAGNOSIS — Z951 Presence of aortocoronary bypass graft: Secondary | ICD-10-CM | POA: Diagnosis not present

## 2012-10-06 DIAGNOSIS — R0789 Other chest pain: Secondary | ICD-10-CM | POA: Diagnosis not present

## 2012-10-06 DIAGNOSIS — R079 Chest pain, unspecified: Secondary | ICD-10-CM | POA: Diagnosis not present

## 2012-10-06 DIAGNOSIS — K7689 Other specified diseases of liver: Secondary | ICD-10-CM | POA: Diagnosis not present

## 2012-10-07 DIAGNOSIS — R109 Unspecified abdominal pain: Secondary | ICD-10-CM | POA: Diagnosis not present

## 2012-10-07 DIAGNOSIS — R079 Chest pain, unspecified: Secondary | ICD-10-CM | POA: Diagnosis not present

## 2012-10-07 DIAGNOSIS — F411 Generalized anxiety disorder: Secondary | ICD-10-CM | POA: Diagnosis not present

## 2012-10-08 ENCOUNTER — Telehealth: Payer: Self-pay | Admitting: *Deleted

## 2012-10-08 MED ORDER — ISOSORBIDE DINITRATE 20 MG PO TABS: 20.0000 mg | ORAL_TABLET | Freq: Two times a day (BID) | ORAL | Status: DC

## 2012-10-08 NOTE — Telephone Encounter (Signed)
Spoke with patient on phone, ordered requested med per Dr Myrtis Ser hospital d/c instructions 10/02/2012

## 2012-10-16 ENCOUNTER — Encounter: Payer: Self-pay | Admitting: Physician Assistant

## 2012-10-16 ENCOUNTER — Ambulatory Visit (INDEPENDENT_AMBULATORY_CARE_PROVIDER_SITE_OTHER): Payer: Medicare Other | Admitting: Physician Assistant

## 2012-10-16 VITALS — BP 120/70 | HR 63 | Ht 64.5 in | Wt 140.6 lb

## 2012-10-16 DIAGNOSIS — I6529 Occlusion and stenosis of unspecified carotid artery: Secondary | ICD-10-CM

## 2012-10-16 DIAGNOSIS — I2510 Atherosclerotic heart disease of native coronary artery without angina pectoris: Secondary | ICD-10-CM

## 2012-10-16 DIAGNOSIS — E785 Hyperlipidemia, unspecified: Secondary | ICD-10-CM

## 2012-10-16 DIAGNOSIS — I10 Essential (primary) hypertension: Secondary | ICD-10-CM | POA: Diagnosis not present

## 2012-10-16 DIAGNOSIS — F411 Generalized anxiety disorder: Secondary | ICD-10-CM

## 2012-10-16 DIAGNOSIS — K219 Gastro-esophageal reflux disease without esophagitis: Secondary | ICD-10-CM

## 2012-10-16 NOTE — Progress Notes (Signed)
[address], [address] [place], [place]  [address] Phone: [phone] Fax:  [phone]  Date:  10/16/2012   ID:  [name] [name], DOB 1940/07/28, MRN [id number]  PCP:  Ailene Ravel, MD  Cardiologist:  Dr. Olga Millers    History of Present Illness: [name] [name] is a 72 y.o. male who returns for follow up after a recent admission to the hospital.     He has a hx of CAD, remote CABG, prior stenting of the left main, RCA and LCX. Cath in December of 2011 demonstrated a 90% stenosis at the bifurcation of AV circumflex/OM and he had a DES to the LCX. His EF was normal. His other issues include carotid disease, HTN, GERD and HL. He does not tolerate statins.  Carotid US (11/13):  L CEA ok; RICA 1-39%.  He is followed by [facility] (Dr. Hart Rochester).    He was admitted 9/10-9/13 after presenting with chest pain. He ruled out for myocardial infarction by enzymes. Inpatient Myoview demonstrated a small area of suspected scar with partial reversibility involving the apical anteroseptal and mid-anteroseptal segments. Therefore, cardiac catheterization was recommended. LHC 10/04/12: oLM 30%, dLM stent into the CFX patent, LAD occluded, LIMA-LAD patent, RI 30%, mid AVCFX 30%, oOM1 50%, oRCA stent patent, mRCA 50-60%, Radial graft-Dx patent; EF 55%.  Continued medical therapy was recommended. Of note, there was partial dissection of the left subclavian and the left vertebral arteries during case with no obstruction of flow.  Blood pressures remained stable.  Since discharge, he has had occasional episodes of chest pain. His PCP placed him on a medication for anxiety (Lexapro). He feels much better with this. He denies significant dyspnea. He is probably NYHA class II-IIb. He denies orthopnea, PND or edema. He denies syncope.  Labs (9/14):  K 3.9, creatinine 1.13, ALT 14, HDL 30, LDL 150, Hgb 12.6, TSH 3.462  Wt Readings from Last 3 Encounters:  10/16/12 140 lb 9.6 oz (63.776 kg)  10/05/12 137 lb 9.1 oz  (62.4 kg)  10/05/12 137 lb 9.1 oz (62.4 kg)     Past Medical History  Diagnosis Date  . Hypertension   . Prostate cancer     s/p cryoablation  . Pancreatitis Dec 2011    ERCP ok.  . Hiatal hernia   . Coronary artery disease     a. S/p CABG in 2001. b. S/p DES to protected LM and BMS to RCA 2006. c. 12/2009: had CP with negative stress test with repeat cath s/p DES to Cx;  d. 09/2011 Cath: patent  RCA, LM, LCX stents, patent LIMA->LAD & Radial->Diag,EF 50-55%-->Med Rx.  . Chronic chest pain   . Left bundle branch block   . GERD (gastroesophageal reflux disease)   . Hyperlipidemia   . Inguinal hernia   . Statin intolerance   . Anxiety disorder   . Peripheral vascular disease 04/10/11    a. s/p left carotid endarterectomy 04/14/2011.  Marland Kitchen Esophageal stricture   . Renal cyst     Seen on CT 08/2011 also with circumferential bladder wall thickening  . Anginal pain   . Anemia 10/06/2011  . Colon polyp   . Hemorrhoids   . Diverticulosis   . Atrial fibrillation     Current Outpatient Prescriptions  Medication Sig Dispense Refill  . amLODipine (NORVASC) 5 MG tablet Take 2.5 mg by mouth 2 (two) times daily.      Marland Kitchen aspirin EC 81 MG tablet Take 81 mg by mouth at bedtime.       Marland Kitchen  clopidogrel (PLAVIX) 75 MG tablet Take 75 mg by mouth daily.      . diphenhydrAMINE (BENADRYL) 25 MG tablet Take 25 mg by mouth at bedtime. sleep      . escitalopram (LEXAPRO) 10 MG tablet       . fluticasone (FLONASE) 50 MCG/ACT nasal spray       . isosorbide dinitrate (ISORDIL) 20 MG tablet Take 1 tablet (20 mg total) by mouth 2 (two) times daily.  60 tablet  3  . metoCLOPramide (REGLAN) 10 MG tablet Take 5 mg by mouth 4 (four) times daily as needed. Stomach pain      . metoprolol (LOPRESSOR) 50 MG tablet Take 25 mg by mouth 2 (two) times daily.       . nitroGLYCERIN (NITROSTAT) 0.4 MG SL tablet Place 0.4 mg under the tongue every 5 (five) minutes as needed. For chest pain      . Omega-3 Fatty Acids (FISH OIL)  1000 MG CAPS Take 1 capsule by mouth 2 (two) times daily.      . pantoprazole (PROTONIX) 40 MG tablet Take 40 mg by mouth 2 (two) times daily.        No current facility-administered medications for this visit.    Allergies:    Allergies  Allergen Reactions  . Amoxicillin-Pot Clavulanate     REACTION: 'Burns' Stomach  . Codeine Nausea And Vomiting  . Erythromycin Other (See Comments)    All mycins cause upset stomach  . Hydrocodone Nausea And Vomiting  . Morphine Nausea And Vomiting  . Nitrofuran Derivatives Other (See Comments)    unknown  . Oxycodone Hcl Nausea And Vomiting  . Shellfish Allergy Swelling  . Tramadol Nausea And Vomiting  . Zolpidem Tartrate Other (See Comments)    hallucinations    Social History:  The patient  reports that he has never smoked. His smokeless tobacco use includes Chew. He reports that he does not drink alcohol or use illicit drugs.   ROS:  Please see the history of present illness.   He notes occasional dysphagia. He also notes diarrhea. He denies melena, hematochezia, hematemesis.   All other systems reviewed and negative.   PHYSICAL EXAM: VS:  BP 120/70  Pulse 63  Ht 5' 4.5" (1.638 m)  Wt 140 lb 9.6 oz (63.776 kg)  BMI 23.77 kg/m2 Well nourished, well developed, in no acute distress HEENT: normal Neck: no JVD Cardiac:  normal S1, S2; RRR; no murmur Lungs:  clear to auscultation bilaterally, no wheezing, rhonchi or rales Abd: soft, nontender, no hepatomegaly Ext: no edema; right groin without hematoma or bruit  Skin: warm and dry Neuro:  CNs 2-12 intact, no focal abnormalities noted  EKG:  NSR, HR 63, LBBB     ASSESSMENT AND PLAN:  1. CAD:  As noted, he had stable anatomy with patent stents and patent grafts at recent cardiac cath.  CP actually improved with treatment of anxiety.  Continue current Rx.  He is intol to statins.  Continue ASA, Plavix, nitrates, beta blocker.   2. Hypertension:  Controlled.  Continue current therapy. BPs  fairly equal in bilateral arms. 3. Hyperlipidemia:  He is intolerant to statins.  Continue lifestyle modification. 4. Carotid Stenosis:  F/u with [facility] as planned. 5. GERD:  Continue PPI and f/u with PCP. 6. Anxiety:  F/u with PCP as directed. 7. Disposition:  F/u with Dr. Olga Millers in 3 mos.  Signed, Tereso Newcomer, PA-C  10/16/2012 12:38 PM

## 2012-10-16 NOTE — Patient Instructions (Addendum)
NO CHANGES WERE MADE TODAY  PLEASE FOLLOW UP WITH DR. CRENSHAW IN 3 MONTHS

## 2012-11-28 DIAGNOSIS — K219 Gastro-esophageal reflux disease without esophagitis: Secondary | ICD-10-CM | POA: Diagnosis not present

## 2012-11-28 DIAGNOSIS — F411 Generalized anxiety disorder: Secondary | ICD-10-CM | POA: Diagnosis not present

## 2012-11-28 DIAGNOSIS — J309 Allergic rhinitis, unspecified: Secondary | ICD-10-CM | POA: Diagnosis not present

## 2012-11-28 DIAGNOSIS — Z1331 Encounter for screening for depression: Secondary | ICD-10-CM | POA: Diagnosis not present

## 2012-11-28 DIAGNOSIS — Z23 Encounter for immunization: Secondary | ICD-10-CM | POA: Diagnosis not present

## 2012-11-28 DIAGNOSIS — Z9181 History of falling: Secondary | ICD-10-CM | POA: Diagnosis not present

## 2012-11-28 DIAGNOSIS — I10 Essential (primary) hypertension: Secondary | ICD-10-CM | POA: Diagnosis not present

## 2012-12-02 ENCOUNTER — Encounter: Payer: Self-pay | Admitting: Family

## 2012-12-03 ENCOUNTER — Ambulatory Visit: Payer: Medicare Other | Admitting: Family

## 2012-12-03 ENCOUNTER — Inpatient Hospital Stay (HOSPITAL_COMMUNITY): Admission: RE | Admit: 2012-12-03 | Payer: Medicare Other | Source: Ambulatory Visit

## 2012-12-06 DIAGNOSIS — R42 Dizziness and giddiness: Secondary | ICD-10-CM | POA: Diagnosis not present

## 2012-12-20 DIAGNOSIS — N419 Inflammatory disease of prostate, unspecified: Secondary | ICD-10-CM | POA: Diagnosis not present

## 2012-12-20 DIAGNOSIS — R300 Dysuria: Secondary | ICD-10-CM | POA: Diagnosis not present

## 2012-12-26 ENCOUNTER — Ambulatory Visit: Payer: Medicare Other | Admitting: Cardiology

## 2013-01-24 DIAGNOSIS — R351 Nocturia: Secondary | ICD-10-CM | POA: Diagnosis not present

## 2013-01-24 DIAGNOSIS — C61 Malignant neoplasm of prostate: Secondary | ICD-10-CM | POA: Diagnosis not present

## 2013-01-24 DIAGNOSIS — Z8546 Personal history of malignant neoplasm of prostate: Secondary | ICD-10-CM | POA: Diagnosis not present

## 2013-01-24 DIAGNOSIS — R350 Frequency of micturition: Secondary | ICD-10-CM | POA: Diagnosis not present

## 2013-02-12 DIAGNOSIS — R109 Unspecified abdominal pain: Secondary | ICD-10-CM | POA: Diagnosis not present

## 2013-02-12 DIAGNOSIS — J019 Acute sinusitis, unspecified: Secondary | ICD-10-CM | POA: Diagnosis not present

## 2013-02-25 DIAGNOSIS — J069 Acute upper respiratory infection, unspecified: Secondary | ICD-10-CM | POA: Diagnosis not present

## 2013-03-17 ENCOUNTER — Telehealth: Payer: Self-pay | Admitting: Internal Medicine

## 2013-03-18 NOTE — Telephone Encounter (Signed)
Wife states pt has been having abdominal pain in his abdomen radiating around to his back. Requests to be seen. Offered pt an appt with [facility] this week but they request next week. Pt scheduled to see Nicoletta Ba PA Monday 03/24/13@1 :30pm. Pt aware of appt.

## 2013-03-18 NOTE — Telephone Encounter (Signed)
Left message for pt to call back  °

## 2013-03-24 ENCOUNTER — Other Ambulatory Visit (INDEPENDENT_AMBULATORY_CARE_PROVIDER_SITE_OTHER): Payer: Medicare Other

## 2013-03-24 ENCOUNTER — Telehealth: Payer: Self-pay | Admitting: *Deleted

## 2013-03-24 ENCOUNTER — Encounter: Payer: Self-pay | Admitting: Physician Assistant

## 2013-03-24 ENCOUNTER — Ambulatory Visit (INDEPENDENT_AMBULATORY_CARE_PROVIDER_SITE_OTHER): Payer: Medicare Other | Admitting: Physician Assistant

## 2013-03-24 VITALS — BP 130/80 | HR 74 | Ht 64.5 in | Wt 147.0 lb

## 2013-03-24 DIAGNOSIS — R1084 Generalized abdominal pain: Secondary | ICD-10-CM

## 2013-03-24 DIAGNOSIS — R110 Nausea: Secondary | ICD-10-CM | POA: Diagnosis not present

## 2013-03-24 LAB — CBC WITH DIFFERENTIAL/PLATELET
Basophils Absolute: 0.1 10*3/uL (ref 0.0–0.1)
Basophils Relative: 0.9 % (ref 0.0–3.0)
EOS PCT: 3.3 % (ref 0.0–5.0)
Eosinophils Absolute: 0.2 10*3/uL (ref 0.0–0.7)
HCT: 42.2 % (ref 39.0–52.0)
Hemoglobin: 13.9 g/dL (ref 13.0–17.0)
Lymphocytes Relative: 34.9 % (ref 12.0–46.0)
Lymphs Abs: 1.9 10*3/uL (ref 0.7–4.0)
MCHC: 32.9 g/dL (ref 30.0–36.0)
MCV: 85.7 fl (ref 78.0–100.0)
MONOS PCT: 11 % (ref 3.0–12.0)
Monocytes Absolute: 0.6 10*3/uL (ref 0.1–1.0)
NEUTROS PCT: 49.9 % (ref 43.0–77.0)
Neutro Abs: 2.7 10*3/uL (ref 1.4–7.7)
PLATELETS: 238 10*3/uL (ref 150.0–400.0)
RBC: 4.92 Mil/uL (ref 4.22–5.81)
RDW: 15.1 % — ABNORMAL HIGH (ref 11.5–14.6)
WBC: 5.5 10*3/uL (ref 4.5–10.5)

## 2013-03-24 LAB — COMPREHENSIVE METABOLIC PANEL
ALBUMIN: 4.3 g/dL (ref 3.5–5.2)
ALT: 31 U/L (ref 0–53)
AST: 26 U/L (ref 0–37)
Alkaline Phosphatase: 57 U/L (ref 39–117)
BUN: 17 mg/dL (ref 6–23)
CO2: 28 meq/L (ref 19–32)
Calcium: 9.6 mg/dL (ref 8.4–10.5)
Chloride: 105 mEq/L (ref 96–112)
Creatinine, Ser: 1.4 mg/dL (ref 0.4–1.5)
GFR: 52.4 mL/min — AB (ref >60.00)
GLUCOSE: 98 mg/dL (ref 70–99)
POTASSIUM: 5.1 meq/L (ref 3.5–5.1)
SODIUM: 140 meq/L (ref 135–145)
TOTAL PROTEIN: 8 g/dL (ref 6.0–8.3)
Total Bilirubin: 1.6 mg/dL — ABNORMAL HIGH (ref 0.3–1.2)

## 2013-03-24 LAB — C-REACTIVE PROTEIN: CRP: <0.5 mg/dL (ref 0.5–20.0)

## 2013-03-24 LAB — LIPASE: LIPASE: 46 U/L (ref 11.0–59.0)

## 2013-03-24 NOTE — Progress Notes (Signed)
Subjective:    Patient ID: [name] [name], male    DOB: [birth year], 73 y.o.   MRN: [id number]  HPI   [name] is a pleasant 73 year old white male known to Dr. Scarlette Shorts. He has history of coronary artery disease, and is status post CABG, and has also had coronary stent in 2011.He  has hypertension, carotid artery stenosis, history of memory loss, prostate cancer for which he underwent cryotherapy. He also had an episode of pancreatitis in 2011 etiology of which was unclear. He underwent ERCP with Dr. Deatra Ina and this was negative. Last colonoscopy and EGD were done in 2010: Showed moderate left-sided diverticulosis and endoscopy showed a distal esophageal ring which was dilated. He had an episode of an acute enteritis with a long segment of small bowel thickening on CT scan in 2013 and this was felt possibly to be an ACE  induced angioedema. His ACE inhibitor was subsequently discontinued. Patient comes in today with complaints of abdominal pain and nausea. When asked how long he had been having symptoms he states over the past year however his symptoms have been worse over the past month which prompted this visit. He and his wife report frequent episodes of lower abdominal pain bilaterally which radiates up into his upper abdomen sometimes into his back and up into his chest. These seem to occur with a lot of movement or exertion and or alleviated by rest. He says sometimes she feels short of breath with these episodes as well he does not have any visible abdominal distention. His appetite has been good his weight has been stable and he does not associated abdominal pain with by mouth intake at this time. He has nausea intermittently. His bowel movements are normal for him and usually has 2-3 bowel movements per day he has not noted any melena or hematochezia. He says he may have an episode that lasted for a couple of hours and then go a day or 2 without any problems and then have a couple of days of more  persistent symptoms. He and his wife are both very clear that the seem to be associated with exertion. Patient had a noncontrasted CT scan of his abdomen in May of 2014 which did show multiple renal cysts and multiple hepatic cysts as well as a small nonobstructing 1-2 mm calculus in the midpole of the left kidney. CT scan of the abdomen and pelvis with contrast had been done in September of two third 2013 at which time he had the acute enteritis and this did show advanced atherosclerotic disease of the aorta and branch vessels. The study was not performed in the arterial phase however the main branches appeared patent .    Review of Systems  Constitutional: Negative.   HENT: Negative.   Eyes: Negative.   Respiratory: Positive for shortness of breath.   Cardiovascular: Positive for chest pain.  Gastrointestinal: Positive for nausea and abdominal pain.  Endocrine: Negative.   Genitourinary: Negative.   Musculoskeletal: Negative.   Allergic/Immunologic: Negative.   Neurological: Negative.   Hematological: Negative.   Psychiatric/Behavioral: Negative.    Outpatient Prescriptions Prior to Visit  Medication Sig Dispense Refill  . amLODipine (NORVASC) 5 MG tablet Take 2.5 mg by mouth 2 (two) times daily.      Marland Kitchen aspirin EC 81 MG tablet Take 81 mg by mouth at bedtime.       . clopidogrel (PLAVIX) 75 MG tablet Take 75 mg by mouth daily.      Marland Kitchen  diphenhydrAMINE (BENADRYL) 25 MG tablet Take 25 mg by mouth at bedtime. sleep      . fluticasone (FLONASE) 50 MCG/ACT nasal spray       . isosorbide dinitrate (ISORDIL) 20 MG tablet Take 1 tablet (20 mg total) by mouth 2 (two) times daily.  60 tablet  3  . metoCLOPramide (REGLAN) 10 MG tablet Take 5 mg by mouth 4 (four) times daily as needed. Stomach pain      . metoprolol (LOPRESSOR) 50 MG tablet Take 25 mg by mouth 2 (two) times daily.       . nitroGLYCERIN (NITROSTAT) 0.4 MG SL tablet Place 0.4 mg under the tongue every 5 (five) minutes as needed. For  chest pain      . Omega-3 Fatty Acids (FISH OIL) 1000 MG CAPS Take 1 capsule by mouth 2 (two) times daily.      . pantoprazole (PROTONIX) 40 MG tablet Take 40 mg by mouth 2 (two) times daily.       Marland Kitchen escitalopram (LEXAPRO) 10 MG tablet        No facility-administered medications prior to visit.   Allergies  Allergen Reactions  . Amoxicillin-Pot Clavulanate     REACTION: 'Burns' Stomach  . Codeine Nausea And Vomiting  . Erythromycin Other (See Comments)    All mycins cause upset stomach  . Hydrocodone Nausea And Vomiting  . Morphine Nausea And Vomiting  . Nitrofuran Derivatives Other (See Comments)    unknown  . Oxycodone Hcl Nausea And Vomiting  . Shellfish Allergy Swelling  . Tramadol Nausea And Vomiting  . Zolpidem Tartrate Other (See Comments)    hallucinations   Patient Active Problem List   Diagnosis Date Noted  . CAD (coronary artery disease) 10/05/2012  . Unstable angina 10/06/2011  . Anemia 10/06/2011  . Enteritis 09/21/2011  . Chest pain 04/12/2011  . Memory loss 04/07/2011  . ANXIETY 02/01/2010  . ABDOMINAL PAIN-EPIGASTRIC 02/01/2010  . ADENOCARCINOMA, PROSTATE 12/22/2008  . DYSPHAGIA UNSPECIFIED 11/18/2008  . COLONIC POLYPS, HX OF 11/18/2008  . HYPERCHOLESTEROLEMIA, PURE 01/20/2008  . HYPERTENSION, BENIGN 01/20/2008  . CAD, ARTERY BYPASS GRAFT 01/20/2008  . LBBB 01/20/2008  . CAROTID ARTERY STENOSIS, WITHOUT INFARCTION 01/20/2008  . GERD 01/20/2008  . CHEST PAIN, NON-CARDIAC 01/20/2008   History  Substance Use Topics  . Smoking status: Never Smoker   . Smokeless tobacco: Current User    Types: Chew  . Alcohol Use: No   family history includes Cancer in his father and sister; Heart attack in his brother, mother, and sister; Heart disease in his brother, mother, and sister; Hypertension in his brother, mother, and sister.     Objective:   Physical Exam   well-developed older white male in no acute distress, pleasant accompanied by his wife blood  pressure 130/80 pulse 74 height 5 foot 4 weight 147. HEENT; nontraumatic normocephalic EOMI PERRLA sclera anicteric, Supple; no JVD, Cardiovascula;r regular rate and rhythm with S1-S2 no murmur or gallop, Pulmonary ;clear bilaterally, Abdomen ;soft nondistended bowel sounds are present there is no palpable mass or hepatosplenomegaly, currently no focal tenderness no guarding or rebound no abdominal  incisional scars, Rectal; exam not done, Extremities ;no clubbing cyanosis or edema skin warm and dry, Psych; mood and affect appropriate        Assessment & Plan:  #11 73 year old male with complaints of recurrent episodes of rather diffuse abdominal pain originating in the lower abdomen radiating into the upper abdomen and up into his chest. Episodes are associated with  exertion and sometimes associated with sense of dyspnea. The symptoms are atypical however he does have history of coronary artery disease prior CABG and coronary stenting and has advanced atherosclerotic changes on previous CT scan though mesenteric vessels and the venous phase appeared patent. Etiology of his symptoms is not clear however am concerned that he may be having an anginal variant or mesenteric insufficiency. #2 previous history of an acute enteritis with a long second segment of thickened small bowel on CT in 2013 felt possibly ACE induced angioedema #3 prior history of pancreatitis etiology unclear #4 history of prostate CA #5 carotid stenosis #6 diverticulosis #7 previously demonstrated hepatic and renal cysts  Plan; schedule for CT scan of the abdomen with CT angio CBC with differential, seem at CRP and lipase Further plans pending results of above

## 2013-03-24 NOTE — Patient Instructions (Signed)
Please go to the basement level to have your labs drawn.    You have been scheduled for a CT scan of the abdomen and pelvis at [facility] ([address]---this is in the same building as Press photographer).   You are scheduled on Thursday,  03-27-2013 at 1:30 PM . You should arrive at 1:15 PM .  WARNING: IF YOU ARE ALLERGIC TO IODINE/X-RAY DYE, PLEASE NOTIFY RADIOLOGY IMMEDIATELY AT [phone]! YOU WILL BE GIVEN A 13 HOUR PREMEDICATION PREP.  1) Do not eat or drink anything after 9:30 am  (4 hours prior to your test) 2) You have been given 2 bottles of oral contrast to drink. The solution may taste better if refrigerated, but do NOT add ice or any other liquid to this solution. Shake well before drinking.    Drink 1 bottle of contrast @ 11;30 am  (2 hours prior to your exam)  Drink 1 bottle of contrast @ 12:30 PM  (1 hour prior to your exam)  You may take any medications as prescribed with a small amount of water except for the following: Metformin, Glucophage, Glucovance, Avandamet, Riomet, Fortamet, Actoplus Met, Janumet, Glumetza or Metaglip. The above medications must be held the day of the exam AND 48 hours after the exam.  The purpose of you drinking the oral contrast is to aid in the visualization of your intestinal tract. The contrast solution may cause some diarrhea. Before your exam is started, you will be given a small amount of fluid to drink. Depending on your individual set of symptoms, you may also receive an intravenous injection of x-ray contrast/dye. Plan on being at [facility] for 30 minutes or long, depending on the type of exam you are having performed.  If you have any questions regarding your exam or if you need to reschedule, you may call the CT department at [phone] between the hours of 8:00 am and 5:00 pm, Monday-Friday.  ________________________________________________________________________

## 2013-03-24 NOTE — Telephone Encounter (Signed)
I called the patient and left a message for the patient to advise the PA he saw today wants to also look at blood vessels in the abdomen so we are doing a different type of CT scan. He is still having it on 04-16-2013 and he still needs to arrive at 1: 15 PM.  The only difference is he does NOT need to drink the contrast we gave him to take home. He can disgard that contrast. I told him to call me if he has any questions.

## 2013-03-24 NOTE — Progress Notes (Signed)
Agree with initial assessment and plans. Cardiology outpatient office visit from September reviewed. Amy to followup on testing

## 2013-03-27 ENCOUNTER — Ambulatory Visit (INDEPENDENT_AMBULATORY_CARE_PROVIDER_SITE_OTHER)
Admission: RE | Admit: 2013-03-27 | Discharge: 2013-03-27 | Disposition: A | Discharge status: Home / Self Care | Payer: Medicare Other | Source: Ambulatory Visit | Attending: Physician Assistant | Admitting: Physician Assistant

## 2013-03-27 DIAGNOSIS — R1084 Generalized abdominal pain: Secondary | ICD-10-CM

## 2013-03-27 DIAGNOSIS — I714 Abdominal aortic aneurysm, without rupture, unspecified: Secondary | ICD-10-CM | POA: Diagnosis not present

## 2013-03-27 MED ORDER — IOHEXOL 350 MG/ML SOLN
100.0000 mL | Freq: Once | INTRAVENOUS | Status: AC | PRN
  Administered 2013-03-27: 100 mL via INTRAVENOUS

## 2013-03-30 ENCOUNTER — Other Ambulatory Visit: Payer: Self-pay | Admitting: Cardiology

## 2013-05-06 ENCOUNTER — Ambulatory Visit: Payer: Medicare Other | Admitting: Internal Medicine

## 2013-05-15 DIAGNOSIS — R42 Dizziness and giddiness: Secondary | ICD-10-CM | POA: Diagnosis not present

## 2013-05-15 DIAGNOSIS — J019 Acute sinusitis, unspecified: Secondary | ICD-10-CM | POA: Diagnosis not present

## 2013-05-23 ENCOUNTER — Other Ambulatory Visit: Payer: Self-pay

## 2013-05-23 MED ORDER — ISOSORBIDE DINITRATE 20 MG PO TABS: ORAL_TABLET | ORAL | Status: DC

## 2013-06-20 ENCOUNTER — Other Ambulatory Visit: Payer: Self-pay

## 2013-06-20 MED ORDER — ISOSORBIDE DINITRATE 20 MG PO TABS: ORAL_TABLET | ORAL | Status: DC

## 2013-06-23 ENCOUNTER — Ambulatory Visit: Payer: Medicare Other | Admitting: Cardiology

## 2013-06-24 ENCOUNTER — Other Ambulatory Visit: Payer: Self-pay

## 2013-06-24 MED ORDER — ISOSORBIDE DINITRATE 20 MG PO TABS: ORAL_TABLET | ORAL | Status: DC

## 2013-07-21 ENCOUNTER — Encounter: Payer: Self-pay | Admitting: Physician Assistant

## 2013-07-21 ENCOUNTER — Ambulatory Visit (INDEPENDENT_AMBULATORY_CARE_PROVIDER_SITE_OTHER): Payer: Medicare Other | Admitting: Physician Assistant

## 2013-07-21 VITALS — BP 140/80 | HR 76 | Ht 64.5 in | Wt 144.0 lb

## 2013-07-21 DIAGNOSIS — I2510 Atherosclerotic heart disease of native coronary artery without angina pectoris: Secondary | ICD-10-CM | POA: Diagnosis not present

## 2013-07-21 DIAGNOSIS — I10 Essential (primary) hypertension: Secondary | ICD-10-CM | POA: Diagnosis not present

## 2013-07-21 DIAGNOSIS — I6529 Occlusion and stenosis of unspecified carotid artery: Secondary | ICD-10-CM | POA: Diagnosis not present

## 2013-07-21 DIAGNOSIS — H8110 Benign paroxysmal vertigo, unspecified ear: Secondary | ICD-10-CM

## 2013-07-21 DIAGNOSIS — K219 Gastro-esophageal reflux disease without esophagitis: Secondary | ICD-10-CM | POA: Diagnosis not present

## 2013-07-21 DIAGNOSIS — E7800 Pure hypercholesterolemia, unspecified: Secondary | ICD-10-CM

## 2013-07-21 DIAGNOSIS — I658 Occlusion and stenosis of other precerebral arteries: Secondary | ICD-10-CM

## 2013-07-21 DIAGNOSIS — I6523 Occlusion and stenosis of bilateral carotid arteries: Secondary | ICD-10-CM

## 2013-07-21 MED ORDER — NITROGLYCERIN 0.4 MG SL SUBL: 0.4000 mg | SUBLINGUAL_TABLET | SUBLINGUAL | Status: DC | PRN

## 2013-07-21 MED ORDER — ISOSORBIDE DINITRATE 20 MG PO TABS: ORAL_TABLET | ORAL | Status: DC

## 2013-07-21 NOTE — Progress Notes (Signed)
Cardiology Office Note    Date:  07/21/2013   ID:  SREEKAR BROYHILL, DOB 07/22/1940, MRN [id number]  PCP:  Leonides Sake, MD  Cardiologist:  Dr. Kirk Ruths      History of Present Illness: [name] [name] is a 73 y.o. male with a hx of CAD, remote CABG, prior stenting of the left main, RCA and LCX. Cath in December of 2011 demonstrated a 90% stenosis at the bifurcation of AV circumflex/OM and he had a DES to the LCX. His EF was normal. His other issues include carotid disease s/p L CEA, HTN, GERD, pancreatitis, prostate CA s/p cryoablation, LBBB and HL. He does not tolerate statins.    He was admitted 09/2012 with chest pain.  Inpatient Myoview demonstrated a small area of suspected scar with partial reversibility involving the apical anteroseptal and mid-anteroseptal segments.  Cardiac cath demonstrated patent stents and patent bypass grafts.  Med Rx was continued.    Last seen 09/2012 after his hospitalization.  He returns for follow up. He notes problems with dizziness for the last 6 months. He describes a spinning sensation. He has been diagnosed with vertigo. He notes chronic dyspnea. He describes NYHA class 2-2b symptoms. He continues to chest discomfort with certain activities as well as increased emotional stress. He also continues to have abdominal pain that often occurs with positional changes and radiates up into his chest. He has been seen by gastroenterology for this. Abdominal and pelvic CTA recently demonstrated no significant mesenteric atherosclerosis. He denies orthopnea, PND. He has mild pedal edema without significant change. He denies syncope.   Studies:  - LHC 10/04/12: oLM 30%, dLM stent into the CFX patent, LAD occluded, LIMA-LAD patent, RI 30%, mid AVCFX 30%, oOM1 50%, oRCA stent patent, mRCA 50-60%, Radial graft-Dx patent; EF 55%. Continued medical therapy was recommended. - Carotid US (11/13): L CEA ok; RICA 1-39%. He is followed by [facility] (Dr. Kellie Simmering).   Abdominal  and Pelvic CTA (03/2013): IMPRESSION: Mild ostial narrowings of the celiac, SMA and IMA origins but no significant ostial or proximal mesenteric vascular occlusive process. All 3 mesenteric vessels remain patent. Mild narrowing of the right renal origin, not significant. Otherwise the renal arteries are patent. Moderate aortoiliac atherosclerosis without occlusive disease or iliac inflow disease. No other CT signs to suggest acute mesenteric ischemia. Chronic hepatic and renal cysts Prior cholecystectomy Colonic diverticulosis    Recent Labs: 10/02/2012: Pro B Natriuretic peptide (BNP) 329.8*  10/03/2012: HDL Cholesterol by NMR 30*; LDL (calc) 150*; TSH 3.462  03/24/2013: ALT 31; Creatinine 1.4; Hemoglobin 13.9; Potassium 5.1   Wt Readings from Last 3 Encounters:  07/21/13 144 lb (65.318 kg)  03/24/13 147 lb (66.679 kg)  10/16/12 140 lb 9.6 oz (63.776 kg)     Past Medical History  Diagnosis Date  . Hypertension   . Prostate cancer     s/p cryoablation  . Pancreatitis Dec 2011    ERCP ok.  . Hiatal hernia   . Chronic chest pain   . Left bundle branch block   . GERD (gastroesophageal reflux disease)   . Hyperlipidemia   . Inguinal hernia   . Statin intolerance   . Anxiety disorder   . Esophageal stricture   . Renal cyst     Seen on CT 08/2011 also with circumferential bladder wall thickening  . Anginal pain   . Anemia 10/06/2011  . Colon polyp     adenomatous  . Hemorrhoids   . Diverticulosis   .  Atrial fibrillation   . Coronary artery disease     a. S/p CABG in 2001. b. S/p DES to protected LM and BMS to RCA 2006. c. 12/2009: s/p DES to Cx;  d. 09/2011 Cath: patent stents, patent grafts -->Med Rx.;  e. CP with abnl Nuc => LHC 10/04/12: oLM 30%, dLM stent into the CFX patent, LAD occluded, LIMA-LAD patent, RI 30%, mid AVCFX 30%, oOM1 50%, oRCA stent patent, mRCA 50-60%, Radial graft-Dx patent; EF 55%.=> Med Rx  . Carotid stenosis 04/10/11    a. s/p left carotid  endarterectomy 04/14/2011.;  b.  Carotid US (11/13):  L CEA ok; RICA 1-39%    Current Outpatient Prescriptions  Medication Sig Dispense Refill  . amLODipine (NORVASC) 5 MG tablet Take 2.5 mg by mouth 2 (two) times daily.      Marland Kitchen aspirin EC 81 MG tablet Take 81 mg by mouth at bedtime.       . isosorbide dinitrate (ISORDIL) 20 MG tablet TAKE 1 TABLET (20 MG TOTAL) BY MOUTH 2 (TWO) TIMES DAILY.  30 tablet  0  . metoCLOPramide (REGLAN) 10 MG tablet Take 5 mg by mouth 4 (four) times daily as needed. Stomach pain      . metoprolol (LOPRESSOR) 50 MG tablet Take 25 mg by mouth 2 (two) times daily.       . nitroGLYCERIN (NITROSTAT) 0.4 MG SL tablet Place 0.4 mg under the tongue every 5 (five) minutes as needed. For chest pain      . Omega-3 Fatty Acids (FISH OIL) 1000 MG CAPS Take 1 capsule by mouth 2 (two) times daily.      . pantoprazole (PROTONIX) 40 MG tablet Take 40 mg by mouth 2 (two) times daily.        No current facility-administered medications for this visit.    Allergies:   Amoxicillin-pot clavulanate; Codeine; Erythromycin; Hydrocodone; Morphine; Nitrofuran derivatives; Oxycodone hcl; Shellfish allergy; Tramadol; and Zolpidem tartrate   Social History:  The patient  reports that he has never smoked. His smokeless tobacco use includes Chew. He reports that he does not drink alcohol or use illicit drugs.   Family History:  The patient's family history includes Cancer in his father and sister; Heart attack in his brother, mother, and sister; Heart disease in his brother, mother, and sister; Hypertension in his brother, mother, and sister.   ROS:  Please see the history of present illness.      All other systems reviewed and negative.   PHYSICAL EXAM: VS:  BP 140/80  Pulse 76  Ht 5' 4.5" (1.638 m)  Wt 144 lb (65.318 kg)  BMI 24.34 kg/m2 Well nourished, well developed, in no acute distress HEENT: normal Neck: no JVD Cardiac:  normal S1, S2; RRR; no murmur Lungs:  clear to auscultation  bilaterally, no wheezing, rhonchi or rales Abd: soft, nontender, no hepatomegaly Ext: no edema Skin: warm and dry Neuro:  CNs 2-12 intact, no focal abnormalities noted  EKG:  NSR, HR 76, LBBB     ASSESSMENT AND PLAN:  1. CAD s/p CABG and multiple prior PCIs:  He has chronic chest pain that is overall stable. He had stable anatomy by cardiac catheterization in 09/2012. There have been no significant changes in his symptoms. Continue medical therapy with aspirin, nitrates, amlodipine, beta blocker. 2. Carotid stenosis, bilateral:  I have asked him to followup with vascular surgery. 3. HYPERTENSION, BENIGN:  Fair control. Continue current therapy. 4. Gastroesophageal reflux disease without esophagitis:  He has a  lot of abdominal pain. He missed his followup with gastroenterology. I have encouraged him to call for a followup. 5. HYPERCHOLESTEROLEMIA, PURE:  He is intolerant to statins. 6. Benign positional vertigo, unspecified laterality:  He continues to have problems with vertigo. I have encouraged him to follow up with primary care. He may need referral to physical therapy. 7. Disposition: Follow up with Dr. [name] in 6 months.   Signed, Versie Starks, MHS 07/21/2013 2:56 PM    [facility] [address] [place], [place]  [address] Phone: [phone]; Fax: [phone]

## 2013-07-21 NOTE — Patient Instructions (Signed)
REFILLS WERE SENT IN FOR ISOSORBIDE AND NTG  PER [facility] WEAVER, PAC YOU WILL NEED TO CALL THE GASTROENTEROLOGY OFFICE AND VEIN AND VASCULAR OFFICE AS WELL FOR APPTOINTMENTS  Your physician wants you to follow-up in: [place] DR. CRENSHAW You will receive a reminder letter in the mail two months in advance. If you don't receive a letter, please call our office to schedule the follow-up appointment.

## 2013-07-31 DIAGNOSIS — Z9889 Other specified postprocedural states: Secondary | ICD-10-CM | POA: Diagnosis not present

## 2013-07-31 DIAGNOSIS — Z951 Presence of aortocoronary bypass graft: Secondary | ICD-10-CM | POA: Insufficient documentation

## 2013-07-31 DIAGNOSIS — I998 Other disorder of circulatory system: Secondary | ICD-10-CM | POA: Diagnosis not present

## 2013-07-31 DIAGNOSIS — Z79899 Other long term (current) drug therapy: Secondary | ICD-10-CM | POA: Diagnosis not present

## 2013-07-31 DIAGNOSIS — Z87448 Personal history of other diseases of urinary system: Secondary | ICD-10-CM | POA: Diagnosis not present

## 2013-07-31 DIAGNOSIS — Z8546 Personal history of malignant neoplasm of prostate: Secondary | ICD-10-CM | POA: Diagnosis not present

## 2013-07-31 DIAGNOSIS — G8929 Other chronic pain: Secondary | ICD-10-CM | POA: Insufficient documentation

## 2013-07-31 DIAGNOSIS — Z860100 Personal history of colon polyps, unspecified: Secondary | ICD-10-CM | POA: Insufficient documentation

## 2013-07-31 DIAGNOSIS — Z880 Allergy status to penicillin: Secondary | ICD-10-CM | POA: Insufficient documentation

## 2013-07-31 DIAGNOSIS — Z862 Personal history of diseases of the blood and blood-forming organs and certain disorders involving the immune mechanism: Secondary | ICD-10-CM | POA: Insufficient documentation

## 2013-07-31 DIAGNOSIS — K219 Gastro-esophageal reflux disease without esophagitis: Secondary | ICD-10-CM | POA: Insufficient documentation

## 2013-07-31 DIAGNOSIS — Z8659 Personal history of other mental and behavioral disorders: Secondary | ICD-10-CM | POA: Insufficient documentation

## 2013-07-31 DIAGNOSIS — Z9861 Coronary angioplasty status: Secondary | ICD-10-CM | POA: Insufficient documentation

## 2013-07-31 DIAGNOSIS — R011 Cardiac murmur, unspecified: Secondary | ICD-10-CM | POA: Insufficient documentation

## 2013-07-31 DIAGNOSIS — R109 Unspecified abdominal pain: Secondary | ICD-10-CM | POA: Diagnosis not present

## 2013-07-31 DIAGNOSIS — Z9089 Acquired absence of other organs: Secondary | ICD-10-CM | POA: Diagnosis not present

## 2013-07-31 DIAGNOSIS — I4891 Unspecified atrial fibrillation: Secondary | ICD-10-CM | POA: Insufficient documentation

## 2013-07-31 DIAGNOSIS — I2510 Atherosclerotic heart disease of native coronary artery without angina pectoris: Secondary | ICD-10-CM | POA: Insufficient documentation

## 2013-07-31 DIAGNOSIS — I10 Essential (primary) hypertension: Secondary | ICD-10-CM | POA: Diagnosis not present

## 2013-07-31 DIAGNOSIS — I999 Unspecified disorder of circulatory system: Secondary | ICD-10-CM | POA: Diagnosis not present

## 2013-07-31 DIAGNOSIS — Z8639 Personal history of other endocrine, nutritional and metabolic disease: Secondary | ICD-10-CM | POA: Insufficient documentation

## 2013-07-31 DIAGNOSIS — I209 Angina pectoris, unspecified: Secondary | ICD-10-CM | POA: Insufficient documentation

## 2013-08-01 ENCOUNTER — Emergency Department (HOSPITAL_COMMUNITY): Payer: Medicare Other

## 2013-08-01 ENCOUNTER — Emergency Department (HOSPITAL_COMMUNITY)
Admission: EM | Admit: 2013-08-01 | Discharge: 2013-08-01 | Disposition: A | Discharge status: Home / Self Care | Payer: Medicare Other | Attending: Emergency Medicine | Admitting: Emergency Medicine

## 2013-08-01 ENCOUNTER — Encounter (HOSPITAL_COMMUNITY): Payer: Self-pay | Admitting: Emergency Medicine

## 2013-08-01 DIAGNOSIS — I999 Unspecified disorder of circulatory system: Secondary | ICD-10-CM

## 2013-08-01 DIAGNOSIS — R109 Unspecified abdominal pain: Secondary | ICD-10-CM

## 2013-08-01 DIAGNOSIS — G8929 Other chronic pain: Secondary | ICD-10-CM

## 2013-08-01 LAB — CBC WITH DIFFERENTIAL/PLATELET
BASOS PCT: 1 % (ref 0–1)
Basophils Absolute: 0 10*3/uL (ref 0.0–0.1)
EOS ABS: 0.1 10*3/uL (ref 0.0–0.7)
Eosinophils Relative: 2 % (ref 0–5)
HCT: 35.6 % — ABNORMAL LOW (ref 39.0–52.0)
HEMOGLOBIN: 12.1 g/dL — AB (ref 13.0–17.0)
Lymphocytes Relative: 30 % (ref 12–46)
Lymphs Abs: 1.8 10*3/uL (ref 0.7–4.0)
MCH: 28.7 pg (ref 26.0–34.0)
MCHC: 34 g/dL (ref 30.0–36.0)
MCV: 84.6 fL (ref 78.0–100.0)
MONOS PCT: 13 % — AB (ref 3–12)
Monocytes Absolute: 0.8 10*3/uL (ref 0.1–1.0)
NEUTROS PCT: 54 % (ref 43–77)
Neutro Abs: 3.2 10*3/uL (ref 1.7–7.7)
PLATELETS: 190 10*3/uL (ref 150–400)
RBC: 4.21 MIL/uL — ABNORMAL LOW (ref 4.22–5.81)
RDW: 15.1 % (ref 11.5–15.5)
WBC: 6 10*3/uL (ref 4.0–10.5)

## 2013-08-01 LAB — COMPREHENSIVE METABOLIC PANEL
ALBUMIN: 3.6 g/dL (ref 3.5–5.2)
ALK PHOS: 62 U/L (ref 39–117)
ALT: 22 U/L (ref 0–53)
ANION GAP: 15 (ref 5–15)
AST: 22 U/L (ref 0–37)
BUN: 20 mg/dL (ref 6–23)
CALCIUM: 8.9 mg/dL (ref 8.4–10.5)
CO2: 25 mEq/L (ref 19–32)
Chloride: 104 mEq/L (ref 96–112)
Creatinine, Ser: 1.3 mg/dL (ref 0.50–1.35)
GFR calc Af Amer: 61 mL/min — ABNORMAL LOW (ref >90)
GFR calc non Af Amer: 53 mL/min — ABNORMAL LOW (ref >90)
Glucose, Bld: 109 mg/dL — ABNORMAL HIGH (ref 70–99)
POTASSIUM: 3.7 meq/L (ref 3.7–5.3)
Sodium: 144 mEq/L (ref 137–147)
TOTAL PROTEIN: 6.9 g/dL (ref 6.0–8.3)
Total Bilirubin: 1 mg/dL (ref 0.3–1.2)

## 2013-08-01 LAB — URINALYSIS, ROUTINE W REFLEX MICROSCOPIC
Bilirubin Urine: NEGATIVE
Glucose, UA: NEGATIVE mg/dL
HGB URINE DIPSTICK: NEGATIVE
Ketones, ur: NEGATIVE mg/dL
LEUKOCYTES UA: NEGATIVE
NITRITE: NEGATIVE
Protein, ur: NEGATIVE mg/dL
SPECIFIC GRAVITY, URINE: 1.027 (ref 1.005–1.030)
UROBILINOGEN UA: 1 mg/dL (ref 0.0–1.0)
pH: 5.5 (ref 5.0–8.0)

## 2013-08-01 LAB — LIPASE, BLOOD: LIPASE: 65 U/L — AB (ref 11–59)

## 2013-08-01 LAB — I-STAT CG4 LACTIC ACID, ED: LACTIC ACID, VENOUS: 1.57 mmol/L (ref 0.5–2.2)

## 2013-08-01 MED ORDER — IOHEXOL 350 MG/ML SOLN
100.0000 mL | Freq: Once | INTRAVENOUS | Status: AC | PRN
  Administered 2013-08-01: 100 mL via INTRAVENOUS

## 2013-08-01 MED ORDER — SODIUM CHLORIDE 0.9 % IV BOLUS (SEPSIS)
500.0000 mL | Freq: Once | INTRAVENOUS | Status: AC
Start: 2013-08-01 — End: 2013-08-01
  Administered 2013-08-01: 500 mL via INTRAVENOUS

## 2013-08-01 MED ORDER — SODIUM CHLORIDE 0.9 % IJ SOLN
INTRAMUSCULAR | Status: AC
  Filled 2013-08-01: qty 200

## 2013-08-01 NOTE — ED Notes (Signed)
Pt given urinal.

## 2013-08-01 NOTE — ED Provider Notes (Signed)
CSN: [id number]     Arrival date & time 07/31/13  2320 History   First MD Initiated Contact with Patient 08/01/13 [phone]     Chief Complaint  Patient presents with  . Abdominal Pain  . Back Pain  . Leg Pain     (Consider location/radiation/quality/duration/timing/severity/associated sxs/prior Treatment) HPI  73 year old male with history of CAD, esophageal stricture, chronic chest pain, anxiety, prostate cancer presents with abdominal discomfort. Patient states for her low a year he has been having recurrent low abnormal pain that radiates to his back. Pain is described as a tightness sharp sensation worsening with exertion and improves with rest. Pain occasionally radiates to his right leg, as is worse he rated as an 8/10 currently the pain is 2/10. For the past several days he also experiencing intermittent heart palpitation, and felt nauseous. Endorse occasional diarrhea. Patient states he's been evaluated for this in the past but no definitive answer was given. He is here today because the pain has worsen since last night. He does have a significant history of vascular diseases, with prior stenting, CABG, vein bypass graft, endarterectomy.   Pt is not a drinker.  No recent medication changes.  No fever, chills, rash, weakness, numbness, lightheadedness, dizziness.     Past Medical History  Diagnosis Date  . Hypertension   . Prostate cancer     s/p cryoablation  . Pancreatitis Dec 2011    ERCP ok.  . Hiatal hernia   . Chronic chest pain   . Left bundle branch block   . GERD (gastroesophageal reflux disease)   . Hyperlipidemia   . Inguinal hernia   . Statin intolerance   . Anxiety disorder   . Esophageal stricture   . Renal cyst     Seen on CT 08/2011 also with circumferential bladder wall thickening  . Anginal pain   . Anemia 10/06/2011  . Colon polyp     adenomatous  . Hemorrhoids   . Diverticulosis   . Atrial fibrillation   . Coronary artery disease     a. S/p CABG in 2001.  b. S/p DES to protected LM and BMS to RCA 2006. c. 12/2009: s/p DES to Cx;  d. 09/2011 Cath: patent stents, patent grafts -->Med Rx.;  e. CP with abnl Nuc => LHC 10/04/12: oLM 30%, dLM stent into the CFX patent, LAD occluded, LIMA-LAD patent, RI 30%, mid AVCFX 30%, oOM1 50%, oRCA stent patent, mRCA 50-60%, Radial graft-Dx patent; EF 55%.=> Med Rx  . Carotid stenosis 04/10/11    a. s/p left carotid endarterectomy 04/14/2011.;  b.  Carotid US (11/13):  L CEA ok; RICA 1-39%   Past Surgical History  Procedure Laterality Date  . Heart stents    . Cholecystectomy    . Inguinal hernia repair      bilateral  . Coronary artery bypass graft  2001  . Endarterectomy  04/14/2011    Procedure: ENDARTERECTOMY CAROTID;  Surgeon: Mal Misty, MD;  Location: [place];  Service: Vascular;  Laterality: Left;  Would like to perform procedure first, at 0730  . Cardiac catheterization    . Coronary angioplasty    . Carotid endarterectomy    . Pr vein bypass graft,aorto-fem-pop     Family History  Problem Relation Age of Onset  . Heart disease Mother     Heart Disease before age 21  . Hypertension Mother   . Heart attack Mother   . Cancer Father   . Hypertension Sister   .  Heart disease Sister     Heart Disease before age 46  . Cancer Sister   . Heart attack Sister   . Hypertension Brother   . Heart disease Brother     Heart Disease before age 41  . Heart attack Brother    History  Substance Use Topics  . Smoking status: Never Smoker   . Smokeless tobacco: Current User    Types: Chew  . Alcohol Use: No    Review of Systems  All other systems reviewed and are negative.     Allergies  Amoxicillin-pot clavulanate; Codeine; Erythromycin; Hydrocodone; Morphine; Nitrofuran derivatives; Oxycodone hcl; Shellfish allergy; Tramadol; and Zolpidem tartrate  Home Medications   Prior to Admission medications   Medication Sig Start Date End Date Taking? Authorizing Provider  amLODipine (NORVASC) 10 MG  tablet Take 10 mg by mouth daily.   Yes Historical Provider, MD  aspirin EC 81 MG tablet Take 81 mg by mouth at bedtime.    Yes Historical Provider, MD  diphenhydrAMINE (BENADRYL) 25 MG tablet Take 25 mg by mouth every 8 (eight) hours as needed for allergies.   Yes Historical Provider, MD  isosorbide dinitrate (ISORDIL) 20 MG tablet Take 20 mg by mouth 2 (two) times daily.   Yes Historical Provider, MD  metoCLOPramide (REGLAN) 10 MG tablet Take 5 mg by mouth 4 (four) times daily as needed. Stomach pain   Yes Historical Provider, MD  metoprolol (LOPRESSOR) 50 MG tablet Take 25 mg by mouth 2 (two) times daily.    Yes Historical Provider, MD  nitroGLYCERIN (NITROSTAT) 0.4 MG SL tablet Place 1 tablet (0.4 mg total) under the tongue every 5 (five) minutes as needed. For chest pain 07/21/13  Yes Scott Joylene Draft, PA-C  pantoprazole (PROTONIX) 40 MG tablet Take 40 mg by mouth 2 (two) times daily.    Yes Historical Provider, MD   BP 130/56  Pulse 64  Temp(Src) 97.6 F (36.4 C) (Oral)  Resp 16  Ht 5\' 4"  (1.626 m)  Wt 143 lb (64.864 kg)  BMI 24.53 kg/m2  SpO2 95% Physical Exam  Constitutional: He is oriented to person, place, and time. He appears well-developed and well-nourished. No distress.  HENT:  Head: Atraumatic.  Eyes: Conjunctivae are normal.  Neck: Normal range of motion. Neck supple.  Cardiovascular: Normal rate, regular rhythm and intact distal pulses.   Murmur heard. Pulmonary/Chest: Effort normal and breath sounds normal. He has no wheezes. He exhibits no tenderness.  Abdominal: Soft. There is tenderness (Mild upper quadrant abdominal tenderness without guarding or rebound tenderness.).  Genitourinary:  No CVA tenderness  Musculoskeletal: He exhibits no edema.  Neurological: He is alert and oriented to person, place, and time.  Skin: No rash noted.  Psychiatric: He has a normal mood and affect.    ED Course  Procedures (including critical care time)  6:44 AM Patient with  recurrent abdominal discomfort. He has a significant history of vascular disease requiring multiple vascular surgery in the past.  His symptoms is suggestive of transient ischemic changes to his vasculature. Will obtain advance imaging for further evaluation.  He does have LUQ abd pain, and mildly elevated lipase.  Has intermittent mildly elevated lipase value in the past. I offer pain med but pt declined at this time.  No active chest pain.    Care discussed with Dr. Aline Brochure.    7:32 AM Since pt has significant vascular disease, abd/pelvis CTA ordered to r/o ischemic diseases such as mesenteric ischemia, AAA or dissection.  9:36 AM Patient has normal lactic acid level, his labs otherwise reassuring.   abdominal and pelvis CTA demonstrates 50% narrowing at the origin of celiac and SMA. No acute ischemic changes noted. Patient likely benefit from followup with his primary care Dr. and with his vascular surgeon for further management of this chronic condition.  Care discussed with Dr. Canary Brim.  Pain is now well control and pt can be discharge.  Return precaution discussed.    Labs Review Labs Reviewed  CBC WITH DIFFERENTIAL - Abnormal; Notable for the following:    RBC 4.21 (*)    Hemoglobin 12.1 (*)    HCT 35.6 (*)    Monocytes Relative 13 (*)    All other components within normal limits  COMPREHENSIVE METABOLIC PANEL - Abnormal; Notable for the following:    Glucose, Bld 109 (*)    GFR calc non Af Amer 53 (*)    GFR calc Af Amer 61 (*)    All other components within normal limits  LIPASE, BLOOD - Abnormal; Notable for the following:    Lipase 65 (*)    All other components within normal limits  URINALYSIS, ROUTINE W REFLEX MICROSCOPIC  I-STAT CG4 LACTIC ACID, ED    Imaging Review Ct Angio Abd/pel W/ And/or W/o  08/01/2013   CLINICAL DATA:  Abdominal pain. Back pain. Diarrhea. Evaluate for mesenteric ischemia  EXAM: CT ANGIOGRAPHY ABDOMEN AND PELVIS WITH CONTRAST AND WITHOUT CONTRAST   TECHNIQUE: Multidetector CT imaging of the abdomen and pelvis was performed using the standard protocol during bolus administration of intravenous contrast. Multiplanar reconstructed images and MIPs were obtained and reviewed to evaluate the vascular anatomy.  CONTRAST:  1109mL OMNIPAQUE IOHEXOL 350 MG/ML SOLN  COMPARISON:  03/27/2013  FINDINGS: The aorta is non aneurysmal and patent with moderate diffuse atherosclerotic plaque.  There is 50% narrowing at the origin of the celiac axis. Branch vessels are patent.  There is 50% narrowing in the proximal SMA secondary to focal plaque just beyond the origin. Branch vessels are grossly patent.  Moderate narrowing at the origin of the IMA. Branch vessels are patent  Moderate atherosclerotic plaque with 50% narrowing at the origin of the right renal artery. The left renal artery is widely patent.  50% narrowing at the origin of the right common iliac artery. Right internal and external iliac arteries are widely patent. 50% narrowing involving the proximal 2 cm of the left common iliac artery. Left internal and external iliac arteries are patent.  Stable intra-abdominal organs. Scattered lumbar degenerative disc disease.  Review of the MIP images confirms the above findings.  IMPRESSION: There is 50% narrowing at the origin of the celiac and SMA. The clinical significance of this is indeterminate. If the patient does have a history of postprandial pain and weight loss, consider conventional angiography with pressure gradient measurements.   Electronically Signed   By: Maryclare Bean M.D.   On: 08/01/2013 08:59     EKG Interpretation None      MDM   Final diagnoses:  Chronic abdominal pain  Vascular disease    BP 114/57  Pulse 62  Temp(Src) 97.6 F (36.4 C) (Oral)  Resp 12  Ht 5\' 4"  (1.626 m)  Wt 143 lb (64.864 kg)  BMI 24.53 kg/m2  SpO2 100%  I have reviewed nursing notes and vital signs. I personally reviewed the imaging tests through PACS system  I  reviewed available ER/hospitalization records thought the EMR     Domenic Moras, [place] 08/01/13 1657

## 2013-08-01 NOTE — ED Notes (Signed)
C/o lower abd. Pain and radiates to back and into left shoulder and upper back. C/o occ. Sob. Onset 3 days ago.

## 2013-08-01 NOTE — ED Notes (Signed)
EDP at bedside  

## 2013-08-01 NOTE — Discharge Instructions (Signed)
You have significant evidence of vascular disease with plaque build up in your blood vessels.  No signs of active blockage at this time and you will benefit from following up with your vascular surgeon or your doctor for further care.  Return to ER if your symptoms worsen or if you have other concerns.

## 2013-08-01 NOTE — ED Notes (Signed)
Pt. reports mid/low abdominal pain for several months , low back pain and right leg pain for several weeks , denies injury , ambulatory . Slight nausea with occasional diarrhea.

## 2013-08-02 NOTE — ED Provider Notes (Signed)
Medical screening examination/treatment/procedure(s) were performed by non-physician practitioner and as supervising physician I was immediately available for consultation/collaboration.   EKG Interpretation None        Blanchard Kelch, MD 08/02/13 [phone]

## 2013-08-11 ENCOUNTER — Telehealth: Payer: Self-pay

## 2013-08-11 NOTE — Telephone Encounter (Signed)
Pt. Seen in the ER 08/01/13 for abdominal and back pain.  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  Referred to Dr. Kellie Simmering.  Has appt. 08/26/13. Stated he noticed his abdominal and back pain increasesd 2 days ago.  States he has had intermittent nausea and vomiting x 3 weeks. Requesting an earlier appt. to see Dr. Kellie Simmering. Advised Dr. Kellie Simmering is on vacation this week and has no available appt's next week.  Will look for any cancellations for a possible opening.  Advised to go to the ER if symptoms continue to worsen.  Verb. Understanding.

## 2013-08-12 ENCOUNTER — Emergency Department (HOSPITAL_COMMUNITY): Payer: Medicare Other

## 2013-08-12 ENCOUNTER — Other Ambulatory Visit: Payer: Self-pay

## 2013-08-12 ENCOUNTER — Encounter (HOSPITAL_COMMUNITY): Payer: Self-pay | Admitting: Emergency Medicine

## 2013-08-12 ENCOUNTER — Emergency Department (HOSPITAL_COMMUNITY)
Admission: EM | Admit: 2013-08-12 | Discharge: 2013-08-12 | Disposition: A | Discharge status: Home / Self Care | Payer: Medicare Other | Attending: Emergency Medicine | Admitting: Emergency Medicine

## 2013-08-12 DIAGNOSIS — Z79899 Other long term (current) drug therapy: Secondary | ICD-10-CM | POA: Diagnosis not present

## 2013-08-12 DIAGNOSIS — I209 Angina pectoris, unspecified: Secondary | ICD-10-CM | POA: Insufficient documentation

## 2013-08-12 DIAGNOSIS — Z8639 Personal history of other endocrine, nutritional and metabolic disease: Secondary | ICD-10-CM | POA: Insufficient documentation

## 2013-08-12 DIAGNOSIS — K219 Gastro-esophageal reflux disease without esophagitis: Secondary | ICD-10-CM | POA: Diagnosis not present

## 2013-08-12 DIAGNOSIS — Z9889 Other specified postprocedural states: Secondary | ICD-10-CM | POA: Diagnosis not present

## 2013-08-12 DIAGNOSIS — Z951 Presence of aortocoronary bypass graft: Secondary | ICD-10-CM | POA: Diagnosis not present

## 2013-08-12 DIAGNOSIS — Z9861 Coronary angioplasty status: Secondary | ICD-10-CM | POA: Diagnosis not present

## 2013-08-12 DIAGNOSIS — Z860100 Personal history of colon polyps, unspecified: Secondary | ICD-10-CM | POA: Insufficient documentation

## 2013-08-12 DIAGNOSIS — Z7982 Long term (current) use of aspirin: Secondary | ICD-10-CM | POA: Diagnosis not present

## 2013-08-12 DIAGNOSIS — R1084 Generalized abdominal pain: Secondary | ICD-10-CM | POA: Insufficient documentation

## 2013-08-12 DIAGNOSIS — R079 Chest pain, unspecified: Secondary | ICD-10-CM | POA: Insufficient documentation

## 2013-08-12 DIAGNOSIS — Q619 Cystic kidney disease, unspecified: Secondary | ICD-10-CM | POA: Insufficient documentation

## 2013-08-12 DIAGNOSIS — Z880 Allergy status to penicillin: Secondary | ICD-10-CM | POA: Insufficient documentation

## 2013-08-12 DIAGNOSIS — Z8546 Personal history of malignant neoplasm of prostate: Secondary | ICD-10-CM | POA: Insufficient documentation

## 2013-08-12 DIAGNOSIS — G8929 Other chronic pain: Secondary | ICD-10-CM | POA: Diagnosis not present

## 2013-08-12 DIAGNOSIS — I2510 Atherosclerotic heart disease of native coronary artery without angina pectoris: Secondary | ICD-10-CM | POA: Diagnosis not present

## 2013-08-12 DIAGNOSIS — I4891 Unspecified atrial fibrillation: Secondary | ICD-10-CM | POA: Diagnosis not present

## 2013-08-12 DIAGNOSIS — I10 Essential (primary) hypertension: Secondary | ICD-10-CM | POA: Insufficient documentation

## 2013-08-12 DIAGNOSIS — Z862 Personal history of diseases of the blood and blood-forming organs and certain disorders involving the immune mechanism: Secondary | ICD-10-CM | POA: Insufficient documentation

## 2013-08-12 DIAGNOSIS — I774 Celiac artery compression syndrome: Secondary | ICD-10-CM | POA: Diagnosis not present

## 2013-08-12 DIAGNOSIS — I7409 Other arterial embolism and thrombosis of abdominal aorta: Secondary | ICD-10-CM | POA: Diagnosis not present

## 2013-08-12 DIAGNOSIS — Z8659 Personal history of other mental and behavioral disorders: Secondary | ICD-10-CM | POA: Insufficient documentation

## 2013-08-12 DIAGNOSIS — I700 Atherosclerosis of aorta: Secondary | ICD-10-CM | POA: Diagnosis not present

## 2013-08-12 DIAGNOSIS — J9819 Other pulmonary collapse: Secondary | ICD-10-CM | POA: Diagnosis not present

## 2013-08-12 DIAGNOSIS — R1032 Left lower quadrant pain: Secondary | ICD-10-CM | POA: Diagnosis present

## 2013-08-12 LAB — TROPONIN I

## 2013-08-12 LAB — COMPREHENSIVE METABOLIC PANEL
ALBUMIN: 3.8 g/dL (ref 3.5–5.2)
ALT: 20 U/L (ref 0–53)
ANION GAP: 12 (ref 5–15)
AST: 20 U/L (ref 0–37)
Alkaline Phosphatase: 66 U/L (ref 39–117)
BUN: 19 mg/dL (ref 6–23)
CO2: 24 mEq/L (ref 19–32)
CREATININE: 1.22 mg/dL (ref 0.50–1.35)
Calcium: 9.1 mg/dL (ref 8.4–10.5)
Chloride: 102 mEq/L (ref 96–112)
GFR calc Af Amer: 66 mL/min — ABNORMAL LOW (ref >90)
GFR calc non Af Amer: 57 mL/min — ABNORMAL LOW (ref >90)
Glucose, Bld: 128 mg/dL — ABNORMAL HIGH (ref 70–99)
Potassium: 3.6 mEq/L — ABNORMAL LOW (ref 3.7–5.3)
Sodium: 138 mEq/L (ref 137–147)
TOTAL PROTEIN: 7.3 g/dL (ref 6.0–8.3)
Total Bilirubin: 0.9 mg/dL (ref 0.3–1.2)

## 2013-08-12 LAB — CBC WITH DIFFERENTIAL/PLATELET
BASOS ABS: 0 10*3/uL (ref 0.0–0.1)
BASOS PCT: 1 % (ref 0–1)
EOS ABS: 0.1 10*3/uL (ref 0.0–0.7)
Eosinophils Relative: 2 % (ref 0–5)
HCT: 39.2 % (ref 39.0–52.0)
HEMOGLOBIN: 13.6 g/dL (ref 13.0–17.0)
Lymphocytes Relative: 32 % (ref 12–46)
Lymphs Abs: 1.8 10*3/uL (ref 0.7–4.0)
MCH: 28.9 pg (ref 26.0–34.0)
MCHC: 34.7 g/dL (ref 30.0–36.0)
MCV: 83.4 fL (ref 78.0–100.0)
MONO ABS: 0.5 10*3/uL (ref 0.1–1.0)
MONOS PCT: 8 % (ref 3–12)
NEUTROS ABS: 3.3 10*3/uL (ref 1.7–7.7)
Neutrophils Relative %: 57 % (ref 43–77)
Platelets: 220 10*3/uL (ref 150–400)
RBC: 4.7 MIL/uL (ref 4.22–5.81)
RDW: 14.2 % (ref 11.5–15.5)
WBC: 5.7 10*3/uL (ref 4.0–10.5)

## 2013-08-12 LAB — LIPASE, BLOOD: LIPASE: 63 U/L — AB (ref 11–59)

## 2013-08-12 MED ORDER — FAMOTIDINE 20 MG PO TABS: 20.0000 mg | ORAL_TABLET | Freq: Two times a day (BID) | ORAL | Status: DC

## 2013-08-12 MED ORDER — IOHEXOL 350 MG/ML SOLN
100.0000 mL | Freq: Once | INTRAVENOUS | Status: AC | PRN
  Administered 2013-08-12: 100 mL via INTRAVENOUS

## 2013-08-12 MED ORDER — HYDROMORPHONE HCL PF 1 MG/ML IJ SOLN
0.5000 mg | Freq: Once | INTRAMUSCULAR | Status: DC
Start: 2013-08-12 — End: 2013-08-12
  Filled 2013-08-12: qty 1

## 2013-08-12 MED ORDER — ONDANSETRON HCL 4 MG/2ML IJ SOLN
4.0000 mg | Freq: Once | INTRAMUSCULAR | Status: AC
  Administered 2013-08-12: 4 mg via INTRAVENOUS
  Filled 2013-08-12: qty 2

## 2013-08-12 MED ORDER — SODIUM CHLORIDE 0.9 % IV BOLUS (SEPSIS)
500.0000 mL | Freq: Once | INTRAVENOUS | Status: AC
  Administered 2013-08-12: 500 mL via INTRAVENOUS

## 2013-08-12 NOTE — Telephone Encounter (Signed)
LM for patient with information, dpm

## 2013-08-12 NOTE — ED Provider Notes (Signed)
CSN: [id number]     Arrival date & time 08/12/13  0131 History   First MD Initiated Contact with Patient 08/12/13 0411     Chief Complaint  Patient presents with  . Abdominal Pain     (Consider location/radiation/quality/duration/timing/severity/associated sxs/prior Treatment) HPI Comments: 73 year old male, history of acid reflux disease, hypertension, heart disease. He presents to the hospital with a complaint of abdominal pain. This is located in the lower abdomen, predominantly the left, radiation to the left and right lower back, associated with nausea. He states that eating and drinking even water makes the pain worse. It usually gets worse approximately one hour after eating. He has had a cholecystectomy, he has occasional diarrhea but his stool today was totally normal and has no dysuria fevers chills swelling rashes numbness or weakness. He does report that the pain is sharp in nature, today it radiated up into his chest briefly, was severe and was associated with diffuse diaphoresis.  Patient is a 73 y.o. male presenting with abdominal pain. The history is provided by the patient and medical records.  Abdominal Pain   Past Medical History  Diagnosis Date  . Hypertension   . Prostate cancer     s/p cryoablation  . Pancreatitis Dec 2011    ERCP ok.  . Hiatal hernia   . Chronic chest pain   . Left bundle branch block   . GERD (gastroesophageal reflux disease)   . Hyperlipidemia   . Inguinal hernia   . Statin intolerance   . Anxiety disorder   . Esophageal stricture   . Renal cyst     Seen on CT 08/2011 also with circumferential bladder wall thickening  . Anginal pain   . Anemia 10/06/2011  . Colon polyp     adenomatous  . Hemorrhoids   . Diverticulosis   . Atrial fibrillation   . Coronary artery disease     a. S/p CABG in 2001. b. S/p DES to protected LM and BMS to RCA 2006. c. 12/2009: s/p DES to Cx;  d. 09/2011 Cath: patent stents, patent grafts -->Med Rx.;  e. CP with  abnl Nuc => LHC 10/04/12: oLM 30%, dLM stent into the CFX patent, LAD occluded, LIMA-LAD patent, RI 30%, mid AVCFX 30%, oOM1 50%, oRCA stent patent, mRCA 50-60%, Radial graft-Dx patent; EF 55%.=> Med Rx  . Carotid stenosis 04/10/11    a. s/p left carotid endarterectomy 04/14/2011.;  b.  Carotid US (11/13):  L CEA ok; RICA 1-39%   Past Surgical History  Procedure Laterality Date  . Heart stents    . Cholecystectomy    . Inguinal hernia repair      bilateral  . Coronary artery bypass graft  2001  . Endarterectomy  04/14/2011    Procedure: ENDARTERECTOMY CAROTID;  Surgeon: Mal Misty, MD;  Location: [facility];  Service: Vascular;  Laterality: Left;  Would like to perform procedure first, at 0730  . Cardiac catheterization    . Coronary angioplasty    . Carotid endarterectomy    . Pr vein bypass graft,aorto-fem-pop     Family History  Problem Relation Age of Onset  . Heart disease Mother     Heart Disease before age 58  . Hypertension Mother   . Heart attack Mother   . Cancer Father   . Hypertension Sister   . Heart disease Sister     Heart Disease before age 63  . Cancer Sister   . Heart attack Sister   . Hypertension  Brother   . Heart disease Brother     Heart Disease before age 54  . Heart attack Brother    History  Substance Use Topics  . Smoking status: Never Smoker   . Smokeless tobacco: Current User    Types: Chew  . Alcohol Use: No    Review of Systems  Gastrointestinal: Positive for abdominal pain.  All other systems reviewed and are negative.     Allergies  Amoxicillin-pot clavulanate; Codeine; Erythromycin; Hydrocodone; Morphine; Nitrofuran derivatives; Oxycodone hcl; Shellfish allergy; Tramadol; and Zolpidem tartrate  Home Medications   Prior to Admission medications   Medication Sig Start Date End Date Taking? Authorizing Provider  amLODipine (NORVASC) 10 MG tablet Take 10 mg by mouth daily.   Yes Historical Provider, MD  aspirin EC 81 MG tablet Take 81  mg by mouth at bedtime.    Yes Historical Provider, MD  diphenhydrAMINE (BENADRYL) 25 MG tablet Take 25 mg by mouth every 8 (eight) hours as needed for allergies.   Yes Historical Provider, MD  isosorbide dinitrate (ISORDIL) 20 MG tablet Take 20 mg by mouth 2 (two) times daily.   Yes Historical Provider, MD  metoCLOPramide (REGLAN) 10 MG tablet Take 5 mg by mouth 4 (four) times daily as needed. Stomach pain   Yes Historical Provider, MD  metoprolol (LOPRESSOR) 50 MG tablet Take 25 mg by mouth 2 (two) times daily.    Yes Historical Provider, MD  nitroGLYCERIN (NITROSTAT) 0.4 MG SL tablet Place 1 tablet (0.4 mg total) under the tongue every 5 (five) minutes as needed. For chest pain 07/21/13  Yes Scott Joylene Draft, PA-C  pantoprazole (PROTONIX) 40 MG tablet Take 40 mg by mouth 2 (two) times daily.    Yes Historical Provider, MD  famotidine (PEPCID) 20 MG tablet Take 1 tablet (20 mg total) by mouth 2 (two) times daily. 08/12/13   Johnna Acosta, MD   BP 136/63  Pulse 59  Temp(Src) 97.6 F (36.4 C) (Oral)  Resp 13  Wt 143 lb 5 oz (65.006 kg)  SpO2 97% Physical Exam  Nursing note and vitals reviewed. Constitutional: He appears well-developed and well-nourished. No distress.  HENT:  Head: Normocephalic and atraumatic.  Mouth/Throat: Oropharynx is clear and moist. No oropharyngeal exudate.  Eyes: Conjunctivae and EOM are normal. Pupils are equal, round, and reactive to light. Right eye exhibits no discharge. Left eye exhibits no discharge. No scleral icterus.  Neck: Normal range of motion. Neck supple. No JVD present. No thyromegaly present.  Cardiovascular: Normal rate, regular rhythm, normal heart sounds and intact distal pulses.  Exam reveals no gallop and no friction rub.   No murmur heard. Pulmonary/Chest: Effort normal and breath sounds normal. No respiratory distress. He has no wheezes. He has no rales.  Abdominal: Soft. Bowel sounds are normal. He exhibits no distension and no mass. There is  tenderness (bilateral lower abdominal tenderness without guarding or peritoneal signs).  Genitourinary:  Rectal exam without masses bleeding hemorrhoids or fissures, no stool in the rectal vault  Musculoskeletal: Normal range of motion. He exhibits no edema and no tenderness.  Lymphadenopathy:    He has no cervical adenopathy.  Neurological: He is alert. Coordination normal.  Skin: Skin is warm and dry. No rash noted. No erythema.  Psychiatric: He has a normal mood and affect. His behavior is normal.    ED Course  Procedures (including critical care time) Labs Review Labs Reviewed  COMPREHENSIVE METABOLIC PANEL - Abnormal; Notable for the following:  Potassium 3.6 (*)    Glucose, Bld 128 (*)    GFR calc non Af Amer 57 (*)    GFR calc Af Amer 66 (*)    All other components within normal limits  LIPASE, BLOOD - Abnormal; Notable for the following:    Lipase 63 (*)    All other components within normal limits  CBC WITH DIFFERENTIAL  TROPONIN I    Imaging Review Ct Angio Chest Pe W/cm &/or Wo Cm  08/12/2013   CLINICAL DATA:  Sudden onset of severe midline lower abdominal pain, radiating to the upper chest, with upper and mid back pain.  EXAM: CT ANGIOGRAPHY CHEST AND ABDOMEN  TECHNIQUE: Multidetector CT imaging of the chest and abdomen was performed using the standard protocol during bolus administration of intravenous contrast. Multiplanar CT image reconstructions and MIPs were obtained to evaluate the vascular anatomy.  CONTRAST:  139mL OMNIPAQUE IOHEXOL 350 MG/ML SOLN  COMPARISON:  None.  CTA of the abdomen and pelvis from 08/01/2013, and CTA of the chest performed 10/04/2011  FINDINGS: CTA CHEST FINDINGS  There is no evidence of aortic dissection. No aneurysmal dilatation is seen along the thoracic aorta. Minimal calcific atherosclerotic disease is noted along the aortic arch, without evidence of intramural thrombus. The great vessels are grossly unremarkable in appearance, aside from  mild intramural thrombus along the proximal left subclavian artery.  There is no evidence of pulmonary embolus.  Minimal bibasilar atelectasis is noted. A 5 mm pleural-based nodule on the right side is stable from 2013 and likely benign. The lungs are otherwise clear. There is no evidence of significant focal consolidation, pleural effusion or pneumothorax. No masses are identified; no abnormal focal contrast enhancement is seen.  The mediastinum is unremarkable in appearance. No mediastinal lymphadenopathy is seen. No pericardial effusion is identified. Diffuse coronary artery calcifications are seen. The patient is status post median sternotomy. No axillary lymphadenopathy is seen. The thyroid gland is unremarkable in appearance.  No acute osseous abnormalities are seen.  Review of the MIP images confirms the above findings.  CTA ABDOMEN FINDINGS  There is no evidence of aortic dissection. Mild intramural thrombus is noted along the course of the abdominal aorta, and scattered calcification is seen along the abdominal aorta and its branches. There is mild luminal narrowing at the aortic bifurcation and both proximal common iliac arteries due to mild intramural thrombus.  No aneurysmal dilatation is seen along the abdominal aorta. There is mild to moderate luminal narrowing at the origin of the celiac trunk, reflecting intramural thrombus. There is also mild narrowing near the origin of the superior mesenteric artery, reflecting intramural thrombus. There is mild narrowing at the origin of the right renal artery, due to intramural thrombus. The left renal artery is grossly unremarkable in appearance. The inferior mesenteric artery remains patent.  The inferior vena cava is grossly unremarkable in appearance.  Scattered hypodensities are seen within the liver, nonspecific but likely reflecting multiple small cysts. These measure up to 2.6 cm in size, and are grossly stable from prior studies. The spleen is  unremarkable in appearance. The patient is status post cholecystectomy, with clips noted at the gallbladder fossa. The pancreas and adrenal glands are unremarkable.  Multiple prominent bilateral renal cysts are seen, measuring up to 6.7 cm in size. The kidneys are otherwise grossly unremarkable. There is no evidence of hydronephrosis. No renal or ureteral stones are identified. A few left-sided renal parapelvic cysts are noted. Mild nonspecific perinephric stranding is noted bilaterally.  No free fluid is identified. The small bowel is unremarkable in appearance. The stomach is within normal limits. No acute vascular abnormalities are seen.  The appendix is normal in caliber, without evidence for appendicitis. The colon is partially filled stool and is unremarkable in appearance, though the distal sigmoid colon is not imaged on this study.  No acute osseous abnormalities are identified.  IMPRESSION: 1. No evidence of aortic dissection. 2. No evidence of aneurysmal dilatation of the thoracic or abdominal aorta. 3. No evidence of pulmonary embolus. 4. Mild intramural thrombus noted along the course of the abdominal aorta, with scattered calcification. Mild luminal narrowing at the aortic bifurcation and both proximal common iliac arteries, mild to moderate luminal narrowing at the origin of the celiac trunk, mild narrowing near the origin of the superior mesenteric artery, and mild narrowing at the origin of the right renal artery, due to intramural thrombus. Mild intramural thrombus also noted along the proximal left subclavian artery. 5. Minimal bibasilar atelectasis noted; lungs otherwise grossly clear. 6. Diffuse coronary artery calcifications seen. 7. Prominent bilateral renal cysts. 8. Scattered hypodensities within the liver are nonspecific but likely reflect multiple small cysts, similar in appearance to prior studies.   Electronically Signed   By: Garald Balding M.D.   On: 08/12/2013 07:08   Ct Cta Abd/pel  W/cm &/or W/o Cm  08/12/2013   CLINICAL DATA:  Sudden onset of severe midline lower abdominal pain, radiating to the upper chest, with upper and mid back pain.  EXAM: CT ANGIOGRAPHY CHEST AND ABDOMEN  TECHNIQUE: Multidetector CT imaging of the chest and abdomen was performed using the standard protocol during bolus administration of intravenous contrast. Multiplanar CT image reconstructions and MIPs were obtained to evaluate the vascular anatomy.  CONTRAST:  113mL OMNIPAQUE IOHEXOL 350 MG/ML SOLN  COMPARISON:  None.  CTA of the abdomen and pelvis from 08/01/2013, and CTA of the chest performed 10/04/2011  FINDINGS: CTA CHEST FINDINGS  There is no evidence of aortic dissection. No aneurysmal dilatation is seen along the thoracic aorta. Minimal calcific atherosclerotic disease is noted along the aortic arch, without evidence of intramural thrombus. The great vessels are grossly unremarkable in appearance, aside from mild intramural thrombus along the proximal left subclavian artery.  There is no evidence of pulmonary embolus.  Minimal bibasilar atelectasis is noted. A 5 mm pleural-based nodule on the right side is stable from 2013 and likely benign. The lungs are otherwise clear. There is no evidence of significant focal consolidation, pleural effusion or pneumothorax. No masses are identified; no abnormal focal contrast enhancement is seen.  The mediastinum is unremarkable in appearance. No mediastinal lymphadenopathy is seen. No pericardial effusion is identified. Diffuse coronary artery calcifications are seen. The patient is status post median sternotomy. No axillary lymphadenopathy is seen. The thyroid gland is unremarkable in appearance.  No acute osseous abnormalities are seen.  Review of the MIP images confirms the above findings.  CTA ABDOMEN FINDINGS  There is no evidence of aortic dissection. Mild intramural thrombus is noted along the course of the abdominal aorta, and scattered calcification is seen  along the abdominal aorta and its branches. There is mild luminal narrowing at the aortic bifurcation and both proximal common iliac arteries due to mild intramural thrombus.  No aneurysmal dilatation is seen along the abdominal aorta. There is mild to moderate luminal narrowing at the origin of the celiac trunk, reflecting intramural thrombus. There is also mild narrowing near the origin of the superior mesenteric artery, reflecting  intramural thrombus. There is mild narrowing at the origin of the right renal artery, due to intramural thrombus. The left renal artery is grossly unremarkable in appearance. The inferior mesenteric artery remains patent.  The inferior vena cava is grossly unremarkable in appearance.  Scattered hypodensities are seen within the liver, nonspecific but likely reflecting multiple small cysts. These measure up to 2.6 cm in size, and are grossly stable from prior studies. The spleen is unremarkable in appearance. The patient is status post cholecystectomy, with clips noted at the gallbladder fossa. The pancreas and adrenal glands are unremarkable.  Multiple prominent bilateral renal cysts are seen, measuring up to 6.7 cm in size. The kidneys are otherwise grossly unremarkable. There is no evidence of hydronephrosis. No renal or ureteral stones are identified. A few left-sided renal parapelvic cysts are noted. Mild nonspecific perinephric stranding is noted bilaterally.  No free fluid is identified. The small bowel is unremarkable in appearance. The stomach is within normal limits. No acute vascular abnormalities are seen.  The appendix is normal in caliber, without evidence for appendicitis. The colon is partially filled stool and is unremarkable in appearance, though the distal sigmoid colon is not imaged on this study.  No acute osseous abnormalities are identified.  IMPRESSION: 1. No evidence of aortic dissection. 2. No evidence of aneurysmal dilatation of the thoracic or abdominal aorta.  3. No evidence of pulmonary embolus. 4. Mild intramural thrombus noted along the course of the abdominal aorta, with scattered calcification. Mild luminal narrowing at the aortic bifurcation and both proximal common iliac arteries, mild to moderate luminal narrowing at the origin of the celiac trunk, mild narrowing near the origin of the superior mesenteric artery, and mild narrowing at the origin of the right renal artery, due to intramural thrombus. Mild intramural thrombus also noted along the proximal left subclavian artery. 5. Minimal bibasilar atelectasis noted; lungs otherwise grossly clear. 6. Diffuse coronary artery calcifications seen. 7. Prominent bilateral renal cysts. 8. Scattered hypodensities within the liver are nonspecific but likely reflect multiple small cysts, similar in appearance to prior studies.   Electronically Signed   By: Garald Balding M.D.   On: 08/12/2013 07:08    ED ECG REPORT  I personally interpreted this EKG   Date: 08/12/2013   Rate: 70  Rhythm: normal sinus rhythm  QRS Axis: normal  Intervals: normal  ST/T Wave abnormalities: nonspecific ST/T changes  Conduction Disutrbances:nonspecific intraventricular conduction delay   Narrative Interpretation:   Old EKG Reviewed: Current EKG has ST abnormalities consistent with left bundle branch block, old EKG with similar abnormalities   MDM   Final diagnoses:  Generalized abdominal pain    The patient has suspicious abdominal pain with radiation into the chest, his EKG does not show any acute findings, the patient does have a history of heart disease a workup for and when  CT scan of the chest abdomen and pelvis angiogram shows no signs of aneurysms or dissection, it shows no signs of significant abnormalities and the patient has been given reassurance that he can follow up as an outpatient. He already has a follow up with the vascular surgeon to evaluate hi vascularity of the abdomen which showed some 50% occlusions  of some of the major vessels. Without a leukocytosis fever or tachycardia I doubt that the patient has mesenteric ischemia, on repeat exam the patient states he is feeling much better and has no abdominal tenderness. He appears stable for discharge at this time. I doubt that his pain  is coming from a surgical source at this time. He does take a daily aspirin, the pain in his epigastrium gets worse with eating, more likely to be related to stomach ulcer, antihistamine added.   He is aware of the indications for return.   Meds given in ED:  Medications  HYDROmorphone (DILAUDID) injection 0.5 mg (0.5 mg Intravenous Not Given 08/12/13 0620)  ondansetron [facility]) injection 4 mg (4 mg Intravenous Given 08/12/13 0619)  sodium chloride 0.9 % bolus 500 mL (500 mLs Intravenous New Bag/Given 08/12/13 0621)  iohexol (OMNIPAQUE) 350 MG/ML injection 100 mL (100 mLs Intravenous Contrast Given 08/12/13 0629)    New Prescriptions   FAMOTIDINE (PEPCID) 20 MG TABLET    Take 1 tablet (20 mg total) by mouth 2 (two) times daily.      Johnna Acosta, MD 08/12/13 [phone]

## 2013-08-12 NOTE — Telephone Encounter (Signed)
There have not been any cancellations to [facility] schedule for 08/19/13 so far. I have placed this patient on the wait list.  I tried to call Mr Main to make him aware at 8:50am, but his phone is not working at this time. dpm

## 2013-08-12 NOTE — Discharge Instructions (Signed)
Please call your doctor for a followup appointment within 24-48 hours. When you talk to your doctor please let them know that you were seen in the emergency department and have them acquire all of your records so that they can discuss the findings with you and formulate a treatment plan to fully care for your new and ongoing problems.  Start taking pepcid twice daily and follow up with Dr. Henrene Pastor this week

## 2013-08-12 NOTE — ED Notes (Signed)
Pt states seen here a few weeks ago for abdominal pain. Pt states that tonight he had severe pain, broke into a sweat and the pain radiated to his chest. Pt denies problems with bowel movements.

## 2013-08-13 ENCOUNTER — Emergency Department (HOSPITAL_COMMUNITY): Payer: Medicare Other

## 2013-08-13 ENCOUNTER — Observation Stay (HOSPITAL_COMMUNITY)
Admission: EM | Admit: 2013-08-13 | Discharge: 2013-08-15 | Disposition: A | Discharge status: Home / Self Care | Payer: Medicare Other | Attending: Cardiology | Admitting: Cardiology

## 2013-08-13 ENCOUNTER — Encounter (HOSPITAL_COMMUNITY): Payer: Self-pay | Admitting: Emergency Medicine

## 2013-08-13 DIAGNOSIS — I700 Atherosclerosis of aorta: Secondary | ICD-10-CM | POA: Insufficient documentation

## 2013-08-13 DIAGNOSIS — Z860100 Personal history of colon polyps, unspecified: Secondary | ICD-10-CM | POA: Insufficient documentation

## 2013-08-13 DIAGNOSIS — Z8546 Personal history of malignant neoplasm of prostate: Secondary | ICD-10-CM | POA: Insufficient documentation

## 2013-08-13 DIAGNOSIS — I200 Unstable angina: Secondary | ICD-10-CM | POA: Diagnosis not present

## 2013-08-13 DIAGNOSIS — R1030 Lower abdominal pain, unspecified: Secondary | ICD-10-CM

## 2013-08-13 DIAGNOSIS — Z862 Personal history of diseases of the blood and blood-forming organs and certain disorders involving the immune mechanism: Secondary | ICD-10-CM | POA: Insufficient documentation

## 2013-08-13 DIAGNOSIS — Z881 Allergy status to other antibiotic agents status: Secondary | ICD-10-CM | POA: Diagnosis not present

## 2013-08-13 DIAGNOSIS — G8929 Other chronic pain: Secondary | ICD-10-CM | POA: Diagnosis not present

## 2013-08-13 DIAGNOSIS — Z951 Presence of aortocoronary bypass graft: Secondary | ICD-10-CM | POA: Diagnosis not present

## 2013-08-13 DIAGNOSIS — I10 Essential (primary) hypertension: Secondary | ICD-10-CM | POA: Diagnosis not present

## 2013-08-13 DIAGNOSIS — F411 Generalized anxiety disorder: Secondary | ICD-10-CM | POA: Insufficient documentation

## 2013-08-13 DIAGNOSIS — R109 Unspecified abdominal pain: Secondary | ICD-10-CM | POA: Diagnosis not present

## 2013-08-13 DIAGNOSIS — E7800 Pure hypercholesterolemia, unspecified: Secondary | ICD-10-CM | POA: Insufficient documentation

## 2013-08-13 DIAGNOSIS — I447 Left bundle-branch block, unspecified: Secondary | ICD-10-CM | POA: Insufficient documentation

## 2013-08-13 DIAGNOSIS — R0602 Shortness of breath: Secondary | ICD-10-CM | POA: Insufficient documentation

## 2013-08-13 DIAGNOSIS — R0789 Other chest pain: Secondary | ICD-10-CM

## 2013-08-13 DIAGNOSIS — Z9889 Other specified postprocedural states: Secondary | ICD-10-CM | POA: Insufficient documentation

## 2013-08-13 DIAGNOSIS — I2510 Atherosclerotic heart disease of native coronary artery without angina pectoris: Secondary | ICD-10-CM | POA: Diagnosis not present

## 2013-08-13 DIAGNOSIS — K219 Gastro-esophageal reflux disease without esophagitis: Secondary | ICD-10-CM | POA: Insufficient documentation

## 2013-08-13 DIAGNOSIS — I25119 Atherosclerotic heart disease of native coronary artery with unspecified angina pectoris: Secondary | ICD-10-CM

## 2013-08-13 DIAGNOSIS — R1013 Epigastric pain: Secondary | ICD-10-CM | POA: Diagnosis not present

## 2013-08-13 DIAGNOSIS — Z7982 Long term (current) use of aspirin: Secondary | ICD-10-CM | POA: Diagnosis not present

## 2013-08-13 DIAGNOSIS — I209 Angina pectoris, unspecified: Secondary | ICD-10-CM | POA: Diagnosis not present

## 2013-08-13 DIAGNOSIS — Z79899 Other long term (current) drug therapy: Secondary | ICD-10-CM | POA: Diagnosis not present

## 2013-08-13 DIAGNOSIS — J9819 Other pulmonary collapse: Secondary | ICD-10-CM | POA: Diagnosis not present

## 2013-08-13 DIAGNOSIS — Z888 Allergy status to other drugs, medicaments and biological substances status: Secondary | ICD-10-CM | POA: Insufficient documentation

## 2013-08-13 DIAGNOSIS — Z885 Allergy status to narcotic agent status: Secondary | ICD-10-CM | POA: Insufficient documentation

## 2013-08-13 DIAGNOSIS — R079 Chest pain, unspecified: Secondary | ICD-10-CM | POA: Insufficient documentation

## 2013-08-13 HISTORY — DX: Unspecified malignant neoplasm of skin, unspecified: C44.90

## 2013-08-13 HISTORY — DX: Low back pain: M54.5

## 2013-08-13 HISTORY — DX: Headache, unspecified: R51.9

## 2013-08-13 HISTORY — DX: Headache: R51

## 2013-08-13 HISTORY — DX: Other chronic pain: G89.29

## 2013-08-13 HISTORY — DX: Unspecified osteoarthritis, unspecified site: M19.90

## 2013-08-13 HISTORY — DX: Low back pain, unspecified: M54.50

## 2013-08-13 HISTORY — DX: Unspecified chronic bronchitis: J42

## 2013-08-13 LAB — TROPONIN I

## 2013-08-13 LAB — CBC
HCT: 40.8 % (ref 39.0–52.0)
HCT: 40.8 % (ref 39.0–52.0)
HEMOGLOBIN: 14 g/dL (ref 13.0–17.0)
Hemoglobin: 13.7 g/dL (ref 13.0–17.0)
MCH: 28.7 pg (ref 26.0–34.0)
MCH: 28.8 pg (ref 26.0–34.0)
MCHC: 34.3 g/dL (ref 30.0–36.0)
MCHC: 33.6 g/dL (ref 30.0–36.0)
MCV: 83.8 fL (ref 78.0–100.0)
MCV: 85.7 fL (ref 78.0–100.0)
Platelets: 215 10*3/uL (ref 150–400)
Platelets: 194 10*3/uL (ref 150–400)
RBC: 4.87 MIL/uL (ref 4.22–5.81)
RBC: 4.76 MIL/uL (ref 4.22–5.81)
RDW: 14.3 % (ref 11.5–15.5)
RDW: 14.4 % (ref 11.5–15.5)
WBC: 6.5 10*3/uL (ref 4.0–10.5)
WBC: 5.2 10*3/uL (ref 4.0–10.5)

## 2013-08-13 LAB — BASIC METABOLIC PANEL
Anion gap: 15 (ref 5–15)
BUN: 16 mg/dL (ref 6–23)
CO2: 24 meq/L (ref 19–32)
Calcium: 9.3 mg/dL (ref 8.4–10.5)
Chloride: 102 mEq/L (ref 96–112)
Creatinine, Ser: 1.19 mg/dL (ref 0.50–1.35)
GFR calc Af Amer: 68 mL/min — ABNORMAL LOW (ref >90)
GFR calc non Af Amer: 59 mL/min — ABNORMAL LOW (ref >90)
GLUCOSE: 87 mg/dL (ref 70–99)
POTASSIUM: 4.2 meq/L (ref 3.7–5.3)
SODIUM: 141 meq/L (ref 137–147)

## 2013-08-13 LAB — I-STAT TROPONIN, ED: TROPONIN I, POC: 0 ng/mL (ref 0.00–0.08)

## 2013-08-13 LAB — CREATININE, SERUM
Creatinine, Ser: 1.21 mg/dL (ref 0.50–1.35)
GFR calc non Af Amer: 58 mL/min — ABNORMAL LOW (ref >90)
GFR, EST AFRICAN AMERICAN: 67 mL/min — AB (ref >90)

## 2013-08-13 LAB — PROTIME-INR
INR: 0.98 (ref 0.00–1.49)
Prothrombin Time: 13 seconds (ref 11.6–15.2)

## 2013-08-13 LAB — PRO B NATRIURETIC PEPTIDE: Pro B Natriuretic peptide (BNP): 132.4 pg/mL — ABNORMAL HIGH (ref 0–125)

## 2013-08-13 MED ORDER — ALPRAZOLAM 0.25 MG PO TABS: 0.2500 mg | ORAL_TABLET | Freq: Two times a day (BID) | ORAL | Status: DC | PRN

## 2013-08-13 MED ORDER — ASPIRIN EC 81 MG PO TBEC
81.0000 mg | DELAYED_RELEASE_TABLET | Freq: Every day | ORAL | Status: DC
  Administered 2013-08-14 – 2013-08-15 (×2): 81 mg via ORAL
  Filled 2013-08-13 (×2): qty 1

## 2013-08-13 MED ORDER — ASPIRIN 81 MG PO CHEW
324.0000 mg | CHEWABLE_TABLET | Freq: Once | ORAL | Status: AC
  Administered 2013-08-13: 324 mg via ORAL
  Filled 2013-08-13: qty 4

## 2013-08-13 MED ORDER — PANTOPRAZOLE SODIUM 40 MG PO TBEC
40.0000 mg | DELAYED_RELEASE_TABLET | Freq: Two times a day (BID) | ORAL | Status: DC
  Administered 2013-08-13 – 2013-08-15 (×3): 40 mg via ORAL
  Filled 2013-08-13 (×3): qty 1

## 2013-08-13 MED ORDER — METOCLOPRAMIDE HCL 5 MG PO TABS
5.0000 mg | ORAL_TABLET | Freq: Four times a day (QID) | ORAL | Status: DC | PRN
  Filled 2013-08-13: qty 1

## 2013-08-13 MED ORDER — ACETAMINOPHEN 325 MG PO TABS
650.0000 mg | ORAL_TABLET | ORAL | Status: DC | PRN
  Administered 2013-08-13: 650 mg via ORAL
  Filled 2013-08-13: qty 2

## 2013-08-13 MED ORDER — ASPIRIN 300 MG RE SUPP: 300.0000 mg | RECTAL | Status: AC

## 2013-08-13 MED ORDER — AMLODIPINE BESYLATE 10 MG PO TABS
10.0000 mg | ORAL_TABLET | Freq: Every day | ORAL | Status: DC
  Administered 2013-08-13 – 2013-08-15 (×3): 10 mg via ORAL
  Filled 2013-08-13 (×3): qty 1

## 2013-08-13 MED ORDER — NITROGLYCERIN 0.4 MG SL SUBL
0.4000 mg | SUBLINGUAL_TABLET | SUBLINGUAL | Status: DC | PRN
  Administered 2013-08-13: 0.4 mg via SUBLINGUAL
  Filled 2013-08-13: qty 1

## 2013-08-13 MED ORDER — NITROGLYCERIN IN D5W 200-5 MCG/ML-% IV SOLN: 5.0000 ug/min | Freq: Once | INTRAVENOUS | Status: DC

## 2013-08-13 MED ORDER — METOPROLOL TARTRATE 25 MG PO TABS
25.0000 mg | ORAL_TABLET | Freq: Two times a day (BID) | ORAL | Status: DC
  Administered 2013-08-13 – 2013-08-15 (×3): 25 mg via ORAL
  Filled 2013-08-13 (×5): qty 1

## 2013-08-13 MED ORDER — HEPARIN SODIUM (PORCINE) 5000 UNIT/ML IJ SOLN
5000.0000 [IU] | Freq: Three times a day (TID) | INTRAMUSCULAR | Status: DC
  Administered 2013-08-13 – 2013-08-15 (×4): 5000 [IU] via SUBCUTANEOUS
  Filled 2013-08-13 (×8): qty 1

## 2013-08-13 MED ORDER — NITROGLYCERIN IN D5W 200-5 MCG/ML-% IV SOLN
5.0000 ug/min | Freq: Once | INTRAVENOUS | Status: AC
Start: 2013-08-13 — End: 2013-08-13
  Administered 2013-08-13: 5 ug/min via INTRAVENOUS
  Filled 2013-08-13: qty 250

## 2013-08-13 MED ORDER — NITROGLYCERIN 0.4 MG SL SUBL: 0.4000 mg | SUBLINGUAL_TABLET | SUBLINGUAL | Status: DC | PRN

## 2013-08-13 MED ORDER — ASPIRIN EC 81 MG PO TBEC: 81.0000 mg | DELAYED_RELEASE_TABLET | Freq: Every day | ORAL | Status: DC

## 2013-08-13 MED ORDER — ASPIRIN 81 MG PO CHEW
324.0000 mg | CHEWABLE_TABLET | ORAL | Status: AC
  Administered 2013-08-13: 324 mg via ORAL
  Filled 2013-08-13: qty 4

## 2013-08-13 MED ORDER — ONDANSETRON HCL 4 MG/2ML IJ SOLN: 4.0000 mg | Freq: Four times a day (QID) | INTRAMUSCULAR | Status: DC | PRN

## 2013-08-13 MED ORDER — ISOSORBIDE DINITRATE 20 MG PO TABS
20.0000 mg | ORAL_TABLET | Freq: Two times a day (BID) | ORAL | Status: DC
  Administered 2013-08-13 – 2013-08-15 (×3): 20 mg via ORAL
  Filled 2013-08-13 (×5): qty 1

## 2013-08-13 NOTE — ED Provider Notes (Signed)
CSN: [id number]     Arrival date & time 08/13/13  1405 History   First MD Initiated Contact with Patient 08/13/13 1416     Chief Complaint  Patient presents with  . Chest Pain     (Consider location/radiation/quality/duration/timing/severity/associated sxs/prior Treatment) HPI Comments: Patient presents to ER for evaluation of chest pain. Patient reports that his pain began when he was walking outside, carrying his trash out. Patient reports that it was moderate and constant after it started. He was feeling short of breath. She noticed that the pain radiated up into the neck bilaterally. He took 2 sublingual nitroglycerin with significant improvement. He went inside and sat down, at which time the pain started to come back. He took an additional nitroglycerin which improved the pain, but did not resolve it. He has taken one additional nitroglycerin since then. Upon arrival to the ER, he reports mild discomfort in the central chest region, 2 or 3/10. No further shortness of breath.  Patient is a 73 y.o. male presenting with chest pain.  Chest Pain Associated symptoms: shortness of breath     Past Medical History  Diagnosis Date  . Hypertension   . Prostate cancer     s/p cryoablation  . Pancreatitis Dec 2011    ERCP ok.  . Hiatal hernia   . Chronic chest pain   . Left bundle branch block   . GERD (gastroesophageal reflux disease)   . Hyperlipidemia   . Inguinal hernia   . Statin intolerance   . Anxiety disorder   . Esophageal stricture   . Renal cyst     Seen on CT 08/2011 also with circumferential bladder wall thickening  . Anginal pain   . Anemia 10/06/2011  . Colon polyp     adenomatous  . Hemorrhoids   . Diverticulosis   . Atrial fibrillation   . Coronary artery disease     a. S/p CABG in 2001. b. S/p DES to protected LM and BMS to RCA 2006. c. 12/2009: s/p DES to Cx;  d. 09/2011 Cath: patent stents, patent grafts -->Med Rx.;  e. CP with abnl Nuc => LHC 10/04/12: oLM 30%,  dLM stent into the CFX patent, LAD occluded, LIMA-LAD patent, RI 30%, mid AVCFX 30%, oOM1 50%, oRCA stent patent, mRCA 50-60%, Radial graft-Dx patent; EF 55%.=> Med Rx  . Carotid stenosis 04/10/11    a. s/p left carotid endarterectomy 04/14/2011.;  b.  Carotid US (11/13):  L CEA ok; RICA 1-39%   Past Surgical History  Procedure Laterality Date  . Heart stents    . Cholecystectomy    . Inguinal hernia repair      bilateral  . Coronary artery bypass graft  2001  . Endarterectomy  04/14/2011    Procedure: ENDARTERECTOMY CAROTID;  Surgeon: Mal Misty, MD;  Location: [place];  Service: Vascular;  Laterality: Left;  Would like to perform procedure first, at 0730  . Cardiac catheterization    . Coronary angioplasty    . Carotid endarterectomy    . Pr vein bypass graft,aorto-fem-pop     Family History  Problem Relation Age of Onset  . Heart disease Mother     Heart Disease before age 14  . Hypertension Mother   . Heart attack Mother   . Cancer Father   . Hypertension Sister   . Heart disease Sister     Heart Disease before age 91  . Cancer Sister   . Heart attack Sister   . Hypertension  Brother   . Heart disease Brother     Heart Disease before age 56  . Heart attack Brother    History  Substance Use Topics  . Smoking status: Never Smoker   . Smokeless tobacco: Current User    Types: Chew  . Alcohol Use: No    Review of Systems  Respiratory: Positive for shortness of breath.   Cardiovascular: Positive for chest pain.  All other systems reviewed and are negative.     Allergies  Amoxicillin-pot clavulanate; Codeine; Erythromycin; Hydrocodone; Morphine; Nitrofuran derivatives; Oxycodone hcl; Shellfish allergy; Tramadol; and Zolpidem tartrate  Home Medications   Prior to Admission medications   Medication Sig Start Date End Date Taking? Authorizing Provider  amLODipine (NORVASC) 10 MG tablet Take 10 mg by mouth daily.   Yes Historical Provider, MD  aspirin EC 81 MG  tablet Take 81 mg by mouth at bedtime.    Yes Historical Provider, MD  diphenhydrAMINE (BENADRYL) 25 MG tablet Take 25 mg by mouth every 8 (eight) hours as needed for allergies.   Yes Historical Provider, MD  famotidine (PEPCID) 20 MG tablet Take 1 tablet (20 mg total) by mouth 2 (two) times daily. 08/12/13  Yes Johnna Acosta, MD  isosorbide dinitrate (ISORDIL) 20 MG tablet Take 20 mg by mouth 2 (two) times daily.   Yes Historical Provider, MD  metoCLOPramide (REGLAN) 10 MG tablet Take 5 mg by mouth 4 (four) times daily as needed. Stomach pain   Yes Historical Provider, MD  metoprolol (LOPRESSOR) 50 MG tablet Take 25 mg by mouth 2 (two) times daily.    Yes Historical Provider, MD  nitroGLYCERIN (NITROSTAT) 0.4 MG SL tablet Place 1 tablet (0.4 mg total) under the tongue every 5 (five) minutes as needed. For chest pain 07/21/13  Yes Scott Joylene Draft, PA-C  pantoprazole (PROTONIX) 40 MG tablet Take 40 mg by mouth 2 (two) times daily.    Yes Historical Provider, MD   BP 133/62  Pulse 64  Temp(Src) 97.5 F (36.4 C) (Oral)  Resp 12  Ht 5\' 4"  (1.626 m)  Wt 143 lb (64.864 kg)  BMI 24.53 kg/m2  SpO2 99% Physical Exam  Constitutional: He is oriented to person, place, and time. He appears well-developed and well-nourished. No distress.  HENT:  Head: Normocephalic and atraumatic.  Right Ear: Hearing normal.  Left Ear: Hearing normal.  Nose: Nose normal.  Mouth/Throat: Oropharynx is clear and moist and mucous membranes are normal.  Eyes: Conjunctivae and EOM are normal. Pupils are equal, round, and reactive to light.  Neck: Normal range of motion. Neck supple.  Cardiovascular: Regular rhythm, S1 normal and S2 normal.  Exam reveals no gallop and no friction rub.   No murmur heard. Pulmonary/Chest: Effort normal and breath sounds normal. No respiratory distress. He exhibits no tenderness.  Abdominal: Soft. Normal appearance and bowel sounds are normal. There is no hepatosplenomegaly. There is no  tenderness. There is no rebound, no guarding, no tenderness at McBurney's point and negative Murphy's sign. No hernia.  Musculoskeletal: Normal range of motion.  Neurological: He is alert and oriented to person, place, and time. He has normal strength. No cranial nerve deficit or sensory deficit. Coordination normal. GCS eye subscore is 4. GCS verbal subscore is 5. GCS motor subscore is 6.  Skin: Skin is warm, dry and intact. No rash noted. No cyanosis.  Psychiatric: He has a normal mood and affect. His speech is normal and behavior is normal. Thought content normal.  ED Course  Procedures (including critical care time) Labs Review Labs Reviewed  BASIC METABOLIC PANEL - Abnormal; Notable for the following:    GFR calc non Af Amer 59 (*)    GFR calc Af Amer 68 (*)    All other components within normal limits  PRO B NATRIURETIC PEPTIDE - Abnormal; Notable for the following:    Pro B Natriuretic peptide (BNP) 132.4 (*)    All other components within normal limits  CBC  I-STAT TROPOININ, ED    Imaging Review Dg Chest 2 View  08/13/2013   CLINICAL DATA:  Midsternal chest pain, shortness of breath  EXAM: CHEST  2 VIEW  COMPARISON:  CT chest 08/12/2013 ; prior chest x-ray 09/28/2012  FINDINGS: Stable cardiac and mediastinal contours. Patient is status post median sternotomy with evidence of multivessel CABG including LIMA bypass. Low inspiratory volumes with mild bibasilar subsegmental atelectasis. No pneumothorax or pleural effusion. No acute osseous abnormality. Surgical clips in the right upper quadrant from prior cholecystectomy.  IMPRESSION: Low inspiratory volumes with mild bibasilar atelectasis.   Electronically Signed   By: Jacqulynn Cadet M.D.   On: 08/13/2013 15:22   Ct Angio Chest Pe W/cm &/or Wo Cm  08/12/2013   CLINICAL DATA:  Sudden onset of severe midline lower abdominal pain, radiating to the upper chest, with upper and mid back pain.  EXAM: CT ANGIOGRAPHY CHEST AND ABDOMEN   TECHNIQUE: Multidetector CT imaging of the chest and abdomen was performed using the standard protocol during bolus administration of intravenous contrast. Multiplanar CT image reconstructions and MIPs were obtained to evaluate the vascular anatomy.  CONTRAST:  176mL OMNIPAQUE IOHEXOL 350 MG/ML SOLN  COMPARISON:  None.  CTA of the abdomen and pelvis from 08/01/2013, and CTA of the chest performed 10/04/2011  FINDINGS: CTA CHEST FINDINGS  There is no evidence of aortic dissection. No aneurysmal dilatation is seen along the thoracic aorta. Minimal calcific atherosclerotic disease is noted along the aortic arch, without evidence of intramural thrombus. The great vessels are grossly unremarkable in appearance, aside from mild intramural thrombus along the proximal left subclavian artery.  There is no evidence of pulmonary embolus.  Minimal bibasilar atelectasis is noted. A 5 mm pleural-based nodule on the right side is stable from 2013 and likely benign. The lungs are otherwise clear. There is no evidence of significant focal consolidation, pleural effusion or pneumothorax. No masses are identified; no abnormal focal contrast enhancement is seen.  The mediastinum is unremarkable in appearance. No mediastinal lymphadenopathy is seen. No pericardial effusion is identified. Diffuse coronary artery calcifications are seen. The patient is status post median sternotomy. No axillary lymphadenopathy is seen. The thyroid gland is unremarkable in appearance.  No acute osseous abnormalities are seen.  Review of the MIP images confirms the above findings.  CTA ABDOMEN FINDINGS  There is no evidence of aortic dissection. Mild intramural thrombus is noted along the course of the abdominal aorta, and scattered calcification is seen along the abdominal aorta and its branches. There is mild luminal narrowing at the aortic bifurcation and both proximal common iliac arteries due to mild intramural thrombus.  No aneurysmal dilatation is  seen along the abdominal aorta. There is mild to moderate luminal narrowing at the origin of the celiac trunk, reflecting intramural thrombus. There is also mild narrowing near the origin of the superior mesenteric artery, reflecting intramural thrombus. There is mild narrowing at the origin of the right renal artery, due to intramural thrombus. The left renal artery is  grossly unremarkable in appearance. The inferior mesenteric artery remains patent.  The inferior vena cava is grossly unremarkable in appearance.  Scattered hypodensities are seen within the liver, nonspecific but likely reflecting multiple small cysts. These measure up to 2.6 cm in size, and are grossly stable from prior studies. The spleen is unremarkable in appearance. The patient is status post cholecystectomy, with clips noted at the gallbladder fossa. The pancreas and adrenal glands are unremarkable.  Multiple prominent bilateral renal cysts are seen, measuring up to 6.7 cm in size. The kidneys are otherwise grossly unremarkable. There is no evidence of hydronephrosis. No renal or ureteral stones are identified. A few left-sided renal parapelvic cysts are noted. Mild nonspecific perinephric stranding is noted bilaterally.  No free fluid is identified. The small bowel is unremarkable in appearance. The stomach is within normal limits. No acute vascular abnormalities are seen.  The appendix is normal in caliber, without evidence for appendicitis. The colon is partially filled stool and is unremarkable in appearance, though the distal sigmoid colon is not imaged on this study.  No acute osseous abnormalities are identified.  IMPRESSION: 1. No evidence of aortic dissection. 2. No evidence of aneurysmal dilatation of the thoracic or abdominal aorta. 3. No evidence of pulmonary embolus. 4. Mild intramural thrombus noted along the course of the abdominal aorta, with scattered calcification. Mild luminal narrowing at the aortic bifurcation and both  proximal common iliac arteries, mild to moderate luminal narrowing at the origin of the celiac trunk, mild narrowing near the origin of the superior mesenteric artery, and mild narrowing at the origin of the right renal artery, due to intramural thrombus. Mild intramural thrombus also noted along the proximal left subclavian artery. 5. Minimal bibasilar atelectasis noted; lungs otherwise grossly clear. 6. Diffuse coronary artery calcifications seen. 7. Prominent bilateral renal cysts. 8. Scattered hypodensities within the liver are nonspecific but likely reflect multiple small cysts, similar in appearance to prior studies.   Electronically Signed   By: Garald Balding M.D.   On: 08/12/2013 07:08   Ct Cta Abd/pel W/cm &/or W/o Cm  08/12/2013   CLINICAL DATA:  Sudden onset of severe midline lower abdominal pain, radiating to the upper chest, with upper and mid back pain.  EXAM: CT ANGIOGRAPHY CHEST AND ABDOMEN  TECHNIQUE: Multidetector CT imaging of the chest and abdomen was performed using the standard protocol during bolus administration of intravenous contrast. Multiplanar CT image reconstructions and MIPs were obtained to evaluate the vascular anatomy.  CONTRAST:  156mL OMNIPAQUE IOHEXOL 350 MG/ML SOLN  COMPARISON:  None.  CTA of the abdomen and pelvis from 08/01/2013, and CTA of the chest performed 10/04/2011  FINDINGS: CTA CHEST FINDINGS  There is no evidence of aortic dissection. No aneurysmal dilatation is seen along the thoracic aorta. Minimal calcific atherosclerotic disease is noted along the aortic arch, without evidence of intramural thrombus. The great vessels are grossly unremarkable in appearance, aside from mild intramural thrombus along the proximal left subclavian artery.  There is no evidence of pulmonary embolus.  Minimal bibasilar atelectasis is noted. A 5 mm pleural-based nodule on the right side is stable from 2013 and likely benign. The lungs are otherwise clear. There is no evidence of  significant focal consolidation, pleural effusion or pneumothorax. No masses are identified; no abnormal focal contrast enhancement is seen.  The mediastinum is unremarkable in appearance. No mediastinal lymphadenopathy is seen. No pericardial effusion is identified. Diffuse coronary artery calcifications are seen. The patient is status post median sternotomy.  No axillary lymphadenopathy is seen. The thyroid gland is unremarkable in appearance.  No acute osseous abnormalities are seen.  Review of the MIP images confirms the above findings.  CTA ABDOMEN FINDINGS  There is no evidence of aortic dissection. Mild intramural thrombus is noted along the course of the abdominal aorta, and scattered calcification is seen along the abdominal aorta and its branches. There is mild luminal narrowing at the aortic bifurcation and both proximal common iliac arteries due to mild intramural thrombus.  No aneurysmal dilatation is seen along the abdominal aorta. There is mild to moderate luminal narrowing at the origin of the celiac trunk, reflecting intramural thrombus. There is also mild narrowing near the origin of the superior mesenteric artery, reflecting intramural thrombus. There is mild narrowing at the origin of the right renal artery, due to intramural thrombus. The left renal artery is grossly unremarkable in appearance. The inferior mesenteric artery remains patent.  The inferior vena cava is grossly unremarkable in appearance.  Scattered hypodensities are seen within the liver, nonspecific but likely reflecting multiple small cysts. These measure up to 2.6 cm in size, and are grossly stable from prior studies. The spleen is unremarkable in appearance. The patient is status post cholecystectomy, with clips noted at the gallbladder fossa. The pancreas and adrenal glands are unremarkable.  Multiple prominent bilateral renal cysts are seen, measuring up to 6.7 cm in size. The kidneys are otherwise grossly unremarkable. There  is no evidence of hydronephrosis. No renal or ureteral stones are identified. A few left-sided renal parapelvic cysts are noted. Mild nonspecific perinephric stranding is noted bilaterally.  No free fluid is identified. The small bowel is unremarkable in appearance. The stomach is within normal limits. No acute vascular abnormalities are seen.  The appendix is normal in caliber, without evidence for appendicitis. The colon is partially filled stool and is unremarkable in appearance, though the distal sigmoid colon is not imaged on this study.  No acute osseous abnormalities are identified.  IMPRESSION: 1. No evidence of aortic dissection. 2. No evidence of aneurysmal dilatation of the thoracic or abdominal aorta. 3. No evidence of pulmonary embolus. 4. Mild intramural thrombus noted along the course of the abdominal aorta, with scattered calcification. Mild luminal narrowing at the aortic bifurcation and both proximal common iliac arteries, mild to moderate luminal narrowing at the origin of the celiac trunk, mild narrowing near the origin of the superior mesenteric artery, and mild narrowing at the origin of the right renal artery, due to intramural thrombus. Mild intramural thrombus also noted along the proximal left subclavian artery. 5. Minimal bibasilar atelectasis noted; lungs otherwise grossly clear. 6. Diffuse coronary artery calcifications seen. 7. Prominent bilateral renal cysts. 8. Scattered hypodensities within the liver are nonspecific but likely reflect multiple small cysts, similar in appearance to prior studies.   Electronically Signed   By: Garald Balding M.D.   On: 08/12/2013 07:08     EKG Interpretation   Date/Time:  Wednesday August 13 2013 14:07:10 EDT Ventricular Rate:  83 PR Interval:  124 QRS Duration: 138 QT Interval:  396 QTC Calculation: 465 R Axis:   -26 Text Interpretation:  Normal sinus rhythm Left bundle branch block  Abnormal ECG No significant change since last tracing  Confirmed by POLLINA   MD, CHRISTOPHER [id number]) on 08/13/2013 2:17:11 PM      MDM   Final diagnoses:  None   unstable angina  Patient presents to ER with complaints of chest pain. Patient has an extensive coronary  artery disease history. Patient did take nitroglycerin with significant improvement of his pain today, but it has come back. Pain appears to be stuttering. He did receive nitroglycerin here in the ER with improvement of his pain, similar to what happened prior to arrival. Patient was therefore placed on a nitroglycerin drip.  Patient was seen in ER yesterday for abdominal pain. The pain that he is experiencing today appears to be different. His workup at previous presentation was negative including CT angiography, no concern for aortic abnormality based on this recent study. Patient's symptoms do not seem consistent with PE.  Cardiology has been consulted to evaluate patient for further management.   Orpah Greek, MD 08/13/13 1630

## 2013-08-13 NOTE — H&P (Addendum)
Admit date: 08/13/2013 Referring Physician  Dr. Betsey Holiday Primary Physician Leonides Sake, MD Primary Cardiologist  Dr. Kirk Ruths Reason for admission: Chest pain  HPI: 73 year old male with coronary artery disease, remote bypass surgery with prior stenting of the left main, RCA, left circumflex with last catheterization on 10/04/12 performed by Dr. Haroldine Laws demonstrating: 1. Continued patency of the IMA to the LAD  2. Continued patency of the radial to the diagonal  3. Continue patency of the LMCA and CFX stents with mild narrowing of the OM1 as in 2011  4. Continued patency of the ostial RCA stent as noted.  5. Preserved overall LV function  6. 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 He was last seen in the office on 07/21/13 by Richardson Dopp, PA and reported to continue to have chest discomfort with certain activities as well as increased emotional stress. He also described abdominal pain that often occurred with positional changes and radiates up to his chest. He has seen by gastroenterology and has had CT scans evaluating this.  Currently he describes an episode while walking outside, carrying out his trash as chest discomfort that was moderate in intensity and fairly constant with a sensation of shortness of breath that radiated up to his neck bilaterally. He took 2 sublingual nitroglycerin with significant improvement. After he went back inside, sat down, his pain began to increase once again and nitroglycerin did not help it. He came to the emergency department for further evaluation. Currently his pain is fairly benign.  Interestingly, he came to the emergency room yesterday for abdominal pain.    PMH:   Past Medical History  Diagnosis Date  . Hypertension   . Prostate cancer     s/p cryoablation  . Pancreatitis Dec 2011    ERCP ok.  . Hiatal hernia     . Chronic chest pain   . Left bundle branch block   . GERD (gastroesophageal reflux disease)   . Hyperlipidemia   . Inguinal hernia   . Statin intolerance   . Anxiety disorder   . Esophageal stricture   . Renal cyst     Seen on CT 08/2011 also with circumferential bladder wall thickening  . Anginal pain   . Anemia 10/06/2011  . Colon polyp     adenomatous  . Hemorrhoids   . Diverticulosis   . Atrial fibrillation   . Coronary artery disease     a. S/p CABG in 2001. b. S/p DES to protected LM and BMS to RCA 2006. c. 12/2009: s/p DES to Cx;  d. 09/2011 Cath: patent stents, patent grafts -->Med Rx.;  e. CP with abnl Nuc => LHC 10/04/12: oLM 30%, dLM stent into the CFX patent, LAD occluded, LIMA-LAD patent, RI 30%, mid AVCFX 30%, oOM1 50%, oRCA stent patent, mRCA 50-60%, Radial graft-Dx patent; EF 55%.=> Med Rx  . Carotid stenosis 04/10/11    a. s/p left carotid endarterectomy 04/14/2011.;  b.  Carotid US (11/13):  L CEA ok; RICA 1-39%    PSH:   Past Surgical History  Procedure Laterality Date  . Heart stents    . Cholecystectomy    . Inguinal hernia repair      bilateral  . Coronary artery bypass graft  2001  . Endarterectomy  04/14/2011    Procedure: ENDARTERECTOMY CAROTID;  Surgeon: Mal Misty, MD;  Location: [facility] OR;  Service: Vascular;  Laterality: Left;  Would like to perform procedure first, at 0730  . Cardiac catheterization    . Coronary angioplasty    . Carotid endarterectomy    . Pr vein bypass graft,aorto-fem-pop     Allergies:  Amoxicillin-pot clavulanate; Codeine; Erythromycin; Hydrocodone; Morphine; Nitrofuran derivatives; Oxycodone hcl; Shellfish allergy; Tramadol; and Zolpidem tartrate Prior to Admit Meds:   Prior to Admission medications   Medication Sig Start Date End Date Taking? Authorizing Provider  amLODipine (NORVASC) 10 MG tablet Take 10 mg by mouth daily.   Yes Historical Provider, MD  aspirin EC 81 MG tablet Take 81 mg by mouth at bedtime.    Yes  Historical Provider, MD  diphenhydrAMINE (BENADRYL) 25 MG tablet Take 25 mg by mouth every 8 (eight) hours as needed for allergies.   Yes Historical Provider, MD  famotidine (PEPCID) 20 MG tablet Take 1 tablet (20 mg total) by mouth 2 (two) times daily. 08/12/13  Yes Johnna Acosta, MD  isosorbide dinitrate (ISORDIL) 20 MG tablet Take 20 mg by mouth 2 (two) times daily.   Yes Historical Provider, MD  metoCLOPramide (REGLAN) 10 MG tablet Take 5 mg by mouth 4 (four) times daily as needed. Stomach pain   Yes Historical Provider, MD  metoprolol (LOPRESSOR) 50 MG tablet Take 25 mg by mouth 2 (two) times daily.    Yes Historical Provider, MD  nitroGLYCERIN (NITROSTAT) 0.4 MG SL tablet Place 1 tablet (0.4 mg total) under the tongue every 5 (five) minutes as needed. For chest pain 07/21/13  Yes Scott Joylene Draft, PA-C  pantoprazole (PROTONIX) 40 MG tablet Take 40 mg by mouth 2 (two) times daily.    Yes Historical Provider, MD   Fam HX:    Family History  Problem Relation Age of Onset  . Heart disease Mother     Heart Disease before age 45  . Hypertension Mother   . Heart attack Mother   . Cancer Father   . Hypertension Sister   . Heart disease Sister     Heart Disease before age 74  . Cancer Sister   . Heart attack Sister   . Hypertension Brother   . Heart disease Brother     Heart Disease before age 7  . Heart attack Brother    Social HX:    History   Social History  . Marital Status: Married    Spouse Name: N/A    Number of Children: 2  . Years of Education: N/A   Occupational History  . Retired    Social History Main Topics  . Smoking status: Never Smoker   . Smokeless tobacco: Current User    Types: Chew  . Alcohol Use: No  . Drug Use: No  . Sexual Activity: Not on file   Other Topics Concern  . Not on file   Social History Narrative  . No narrative on file     ROS:  Positive for abdominal pain, chest pain, shortness of breath, radiation of symptoms to neck, exertional  chest discomfort, no strokelike symptoms, no syncope, no bleeding, no orthopnea, no melena, no hematemesis the All 11 ROS were addressed and are negative except what is stated in the HPI   Physical Exam: Blood pressure 133/62, pulse 64, temperature 97.5 F (36.4 C), temperature source Oral, resp. rate 12, height 5\' 4"  (1.626 m), weight 143 lb (64.864 kg), SpO2 99.00%.   General: Well developed, well nourished, in no acute distress Head:  Eyes PERRLA, No xanthomas.   Normal cephalic and atramatic  Lungs:   Clear bilaterally to auscultation and percussion. Normal respiratory effort. No wheezes, no rales. Heart:   HRRR S1 S2 Pulses are 2+ & equal. No murmur, rubs, gallops.  No carotid bruit. No JVD.  No abdominal bruits. Bypass scar Abdomen: Bowel sounds are positive, abdomen soft and non-tender without masses. No hepatosplenomegaly. Msk:  Back normal. Normal strength and tone for age. Extremities:  No clubbing, cyanosis or edema.  DP +1 Neuro: Alert and oriented X 3, non-focal, MAE x 4 GU: Deferred Rectal: Deferred Psych:  Good affect, responds appropriately      Labs: Lab Results  Component Value Date   WBC 6.5 08/13/2013   HGB 14.0 08/13/2013   HCT 40.8 08/13/2013   MCV 83.8 08/13/2013   PLT 215 08/13/2013     Recent Labs Lab 08/12/13 0148 08/13/13 1425  NA 138 141  K 3.6* 4.2  CL 102 102  CO2 24 24  BUN 19 16  CREATININE 1.22 1.19  CALCIUM 9.1 9.3  PROT 7.3  --   BILITOT 0.9  --   ALKPHOS 66  --   ALT 20  --   AST 20  --   GLUCOSE 128* 87    Recent Labs  08/12/13 0148  TROPONINI <0.30   Lab Results  Component Value Date   CHOL 223* 10/03/2012   HDL 30* 10/03/2012   LDLCALC 150* 10/03/2012   TRIG 217* 10/03/2012   Lab Results  Component Value Date   DDIMER 0.50* 10/05/2011     EKG: 08/13/13-sinus rhythm, left bundle branch block, no significant change from prior.  Personally viewed.   Nuclear stress test 10/03/12-small area of suspected scar with partial  reversibility involving the apical, anteroseptal and mid anteroseptal segments. Ejection fraction 61%.  CT scan of chest/abdomen on 08/12/13: -No evidence of aortic dissection -No evidence of pulmonary embolism -Mild intramural thrombus noted along the course of the abdominal aorta with scattered atherosclerosis. Mild intramural thrombus also noted along the proximal left subclavian artery. -Diffuse coronary artery calcifications  Chest x-ray 08/13/13 -No acute airspace disease. -Median sternotomy, LIMA bypass.  ASSESSMENT/PLAN:    73 year old male with coronary artery disease status post bypass surgery with chest pain.  1. Chest pain-certainly could be unstable angina. EKG is left bundle branch block, no change. Recent cardiac catheterization reviewed as above from September of 2014 reassuring. This was in the setting of another chest pain episode. We will go ahead and bring them in for observation. If troponin becomes positive, we'll likely proceed with left heart catheterization however we would have to be very careful with selecting LIMA given his prior left subclavian dissection and current intramural hematoma. If troponins are normal, it would not be unreasonable to proceed with nuclear stress test. Also, we will optimize his antianginal medications. Perhaps we will have room to increase his isosorbide. His nitroglycerin drip does not seem to be helping at this time, causing headache. I will discontinue. I will not start heparin unless troponin becomes positive.  2. Abdominal pain-he was in emergency room yesterday for this. CT scan reviewed. I do not think at this time we need to get GI involved. He is had extensive workup in the past. Could there be a possibility of mesenteric ischemia? Maintain hydration. He does see Dr. Kellie Simmering.  3. CAD-prior bypass-cardiac catheterization as above in September of last year. Patent grafts.  In general, he has been in and out of the  emergency room several  times and his wife really wants an answer to why he is feeling the way he is feeling. Most of the time he states that he has abdominal discomfort that seems to radiate up to his chest. He also states that he is always dizzy and was told that he had vertigo. I did explain to them carefully that hopefully his cardiac biomarkers will be normal and we will be able to exclude myocardial infarction and if this is the case and if stress test is unremarkable, there could be several different etiologies for the generalized discomfort  that he is having. We will continue to work on medications such as isosorbide.    Candee Furbish, MD  08/13/2013  4:54 PM

## 2013-08-13 NOTE — ED Notes (Signed)
Patient transported to X-ray 

## 2013-08-13 NOTE — ED Notes (Signed)
He states he was taking the trash out today when he began to have CP and SOB. Pain is radiating into his neck. He took 4 nitro at home with some relief of pain, but pain returns each time. hes A&Ox4, resp e/u

## 2013-08-13 NOTE — ED Notes (Signed)
Pt states unable to take any type of pain medication and his pain is never below 2

## 2013-08-14 ENCOUNTER — Observation Stay (HOSPITAL_COMMUNITY): Payer: Medicare Other

## 2013-08-14 DIAGNOSIS — R079 Chest pain, unspecified: Secondary | ICD-10-CM | POA: Diagnosis not present

## 2013-08-14 DIAGNOSIS — I209 Angina pectoris, unspecified: Secondary | ICD-10-CM | POA: Diagnosis not present

## 2013-08-14 DIAGNOSIS — I2510 Atherosclerotic heart disease of native coronary artery without angina pectoris: Secondary | ICD-10-CM | POA: Diagnosis not present

## 2013-08-14 LAB — BASIC METABOLIC PANEL
Anion gap: 11 (ref 5–15)
BUN: 14 mg/dL (ref 6–23)
CO2: 23 mEq/L (ref 19–32)
Calcium: 8.7 mg/dL (ref 8.4–10.5)
Chloride: 104 mEq/L (ref 96–112)
Creatinine, Ser: 1.26 mg/dL (ref 0.50–1.35)
GFR, EST AFRICAN AMERICAN: 64 mL/min — AB (ref >90)
GFR, EST NON AFRICAN AMERICAN: 55 mL/min — AB (ref >90)
Glucose, Bld: 102 mg/dL — ABNORMAL HIGH (ref 70–99)
POTASSIUM: 4 meq/L (ref 3.7–5.3)
SODIUM: 138 meq/L (ref 137–147)

## 2013-08-14 LAB — CBC
HCT: 36.9 % — ABNORMAL LOW (ref 39.0–52.0)
Hemoglobin: 12.5 g/dL — ABNORMAL LOW (ref 13.0–17.0)
MCH: 28.7 pg (ref 26.0–34.0)
MCHC: 33.9 g/dL (ref 30.0–36.0)
MCV: 84.8 fL (ref 78.0–100.0)
Platelets: 176 10*3/uL (ref 150–400)
RBC: 4.35 MIL/uL (ref 4.22–5.81)
RDW: 14.2 % (ref 11.5–15.5)
WBC: 4.5 10*3/uL (ref 4.0–10.5)

## 2013-08-14 LAB — TROPONIN I: Troponin I: <0.3 ng/mL (ref <0.30)

## 2013-08-14 MED ORDER — TECHNETIUM TC 99M SESTAMIBI GENERIC - CARDIOLITE
10.0000 | Freq: Once | INTRAVENOUS | Status: AC | PRN
  Administered 2013-08-14: 10 via INTRAVENOUS

## 2013-08-14 MED ORDER — FENTANYL CITRATE 0.05 MG/ML IJ SOLN: 25.0000 ug | Freq: Four times a day (QID) | INTRAMUSCULAR | Status: DC | PRN

## 2013-08-14 MED ORDER — GI COCKTAIL ~~LOC~~
30.0000 mL | Freq: Once | ORAL | Status: AC | PRN
  Administered 2013-08-14: 30 mL via ORAL
  Filled 2013-08-14: qty 30

## 2013-08-14 MED ORDER — TECHNETIUM TC 99M SESTAMIBI GENERIC - CARDIOLITE
30.0000 | Freq: Once | INTRAVENOUS | Status: AC | PRN
  Administered 2013-08-14: 30 via INTRAVENOUS

## 2013-08-14 MED ORDER — REGADENOSON 0.4 MG/5ML IV SOLN
INTRAVENOUS | Status: AC
  Administered 2013-08-14: 12:00:00
  Filled 2013-08-14: qty 5

## 2013-08-14 NOTE — Progress Notes (Signed)
Pt reported pain that started in his lower abdomen and moved into his upper abdomen and around to his back.  The pt reports that the pain started about 30 minutes after eating, which is when he says that the pain frequently starts.  The pt reports occasional abdominal pain that is intermittent, a 3/10, and feels like an ache.  The pt denies any chest pain at this time.  The pt was given Acetaminophen and was asleep within an hour afterwards.  Will continue to monitor.

## 2013-08-14 NOTE — Progress Notes (Signed)
Pt complaining about some pain on L side of face. VS assessed, WNL. States his throat feels dry, hard to swallow. Gave pt some water and he states that made his throat feel better; also states indigestion/chest discomfort has gone now. Spoke with pts nurse on unit. Will continue to monitor pt for additional complaints.

## 2013-08-14 NOTE — Progress Notes (Signed)
SUBJECTIVE:  Mild chest soreness.  Diffuse aches and pains with chronic abdominal pain   PHYSICAL EXAM Filed Vitals:   08/13/13 1830 08/13/13 1853 08/13/13 2058 08/14/13 0500  BP: 123/61 154/62 152/66 116/59  Pulse: 77 67 86 59  Temp:  97.3 F (36.3 C) 97.5 F (36.4 C) 97.8 F (36.6 C)  TempSrc:  Oral Oral Oral  Resp: 15 16 16 18   Height:      Weight:    140 lb 8 oz (63.73 kg)  SpO2: 97% 100% 100% 98%   General:  No distress Lungs:  Clear Heart:  RRR Abdomen:  Positive bowel sounds, no rebound no guarding Extremities:  No edema  LABS: Lab Results  Component Value Date   TROPONINI <0.30 08/14/2013   Results for orders placed during the hospital encounter of 08/13/13 (from the past 24 hour(s))  CBC     Status: None   Collection Time    08/13/13  2:25 PM      Result Value Ref Range   WBC 6.5  4.0 - 10.5 K/uL   RBC 4.87  4.22 - 5.81 MIL/uL   Hemoglobin 14.0  13.0 - 17.0 g/dL   HCT 40.8  39.0 - 52.0 %   MCV 83.8  78.0 - 100.0 fL   MCH 28.7  26.0 - 34.0 pg   MCHC 34.3  30.0 - 36.0 g/dL   RDW 14.3  11.5 - 15.5 %   Platelets 215  150 - 400 K/uL  BASIC METABOLIC PANEL     Status: Abnormal   Collection Time    08/13/13  2:25 PM      Result Value Ref Range   Sodium 141  137 - 147 mEq/L   Potassium 4.2  3.7 - 5.3 mEq/L   Chloride 102  96 - 112 mEq/L   CO2 24  19 - 32 mEq/L   Glucose, Bld 87  70 - 99 mg/dL   BUN 16  6 - 23 mg/dL   Creatinine, Ser 1.19  0.50 - 1.35 mg/dL   Calcium 9.3  8.4 - 10.5 mg/dL   GFR calc non Af Amer 59 (*) >90 mL/min   GFR calc Af Amer 68 (*) >90 mL/min   Anion gap 15  5 - 15  PRO B NATRIURETIC PEPTIDE     Status: Abnormal   Collection Time    08/13/13  2:25 PM      Result Value Ref Range   Pro B Natriuretic peptide (BNP) 132.4 (*) 0 - 125 pg/mL  I-STAT TROPOININ, ED     Status: None   Collection Time    08/13/13  2:34 PM      Result Value Ref Range   Troponin i, poc 0.00  0.00 - 0.08 ng/mL   Comment 3           TROPONIN I      Status: None   Collection Time    08/13/13  7:17 PM      Result Value Ref Range   Troponin I <0.30  <0.30 ng/mL  PROTIME-INR     Status: None   Collection Time    08/13/13  7:17 PM      Result Value Ref Range   Prothrombin Time 13.0  11.6 - 15.2 seconds   INR 0.98  0.00 - 1.49  CBC     Status: None   Collection Time    08/13/13  7:17 PM      Result Value  Ref Range   WBC 5.2  4.0 - 10.5 K/uL   RBC 4.76  4.22 - 5.81 MIL/uL   Hemoglobin 13.7  13.0 - 17.0 g/dL   HCT 40.8  39.0 - 52.0 %   MCV 85.7  78.0 - 100.0 fL   MCH 28.8  26.0 - 34.0 pg   MCHC 33.6  30.0 - 36.0 g/dL   RDW 14.4  11.5 - 15.5 %   Platelets 194  150 - 400 K/uL  CREATININE, SERUM     Status: Abnormal   Collection Time    08/13/13  7:17 PM      Result Value Ref Range   Creatinine, Ser 1.21  0.50 - 1.35 mg/dL   GFR calc non Af Amer 58 (*) >90 mL/min   GFR calc Af Amer 67 (*) >90 mL/min  CBC     Status: Abnormal   Collection Time    08/14/13 12:50 AM      Result Value Ref Range   WBC 4.5  4.0 - 10.5 K/uL   RBC 4.35  4.22 - 5.81 MIL/uL   Hemoglobin 12.5 (*) 13.0 - 17.0 g/dL   HCT 36.9 (*) 39.0 - 52.0 %   MCV 84.8  78.0 - 100.0 fL   MCH 28.7  26.0 - 34.0 pg   MCHC 33.9  30.0 - 36.0 g/dL   RDW 14.2  11.5 - 15.5 %   Platelets 176  150 - 400 K/uL  BASIC METABOLIC PANEL     Status: Abnormal   Collection Time    08/14/13 12:50 AM      Result Value Ref Range   Sodium 138  137 - 147 mEq/L   Potassium 4.0  3.7 - 5.3 mEq/L   Chloride 104  96 - 112 mEq/L   CO2 23  19 - 32 mEq/L   Glucose, Bld 102 (*) 70 - 99 mg/dL   BUN 14  6 - 23 mg/dL   Creatinine, Ser 1.26  0.50 - 1.35 mg/dL   Calcium 8.7  8.4 - 10.5 mg/dL   GFR calc non Af Amer 55 (*) >90 mL/min   GFR calc Af Amer 64 (*) >90 mL/min   Anion gap 11  5 - 15  TROPONIN I     Status: None   Collection Time    08/14/13 12:50 AM      Result Value Ref Range   Troponin I <0.30  <0.30 ng/mL  TROPONIN I     Status: None   Collection Time    08/14/13  6:35 AM       Result Value Ref Range   Troponin I <0.30  <0.30 ng/mL    Intake/Output Summary (Last 24 hours) at 08/14/13 0943 Last data filed at 08/14/13 0525  Gross per 24 hour  Intake    240 ml  Output    275 ml  Net    -35 ml    ASSESSMENT AND PLAN:  CHEST PAIN:  Negative cardiac biomarkers.   Awaiting stress test results.  HYPERCHOLESTEROLEMIA, PURE:  Continue current therapy.    HYPERTENSION, BENIGN:  Continue current therapy.    CAD, ARTERY BYPASS GRAFT:  See above.    ABDOMINAL PAIN-EPIGASTRIC:  No further in patient work up.  Patient has outpatient follow up with vascular.  No evidence of mesenteric ischemia on CT in March.  He missed a follow up appt in April and we will reschedule this.      Jeneen Rinks [facility] 08/14/2013 9:43 AM

## 2013-08-14 NOTE — Care Management Note (Signed)
    Page 1 of 1   08/14/2013     4:12:29 PM CARE MANAGEMENT NOTE 08/14/2013  Patient:  [name] [name], [name] [name]   Account Number:  [id number]  Date Initiated:  08/14/2013  Documentation initiated by:  GRAVES-BIGELOW,Richell Corker  Subjective/Objective Assessment:   Pt admitted for cp and post Stress test.     Action/Plan:   CM received referral for medication assistance. Pt uses [facility] in [place].  Pt has insurance. CM did try to call wife and daughter however no answer. CM unable to assist due to insurance coverage.   Anticipated DC Date:  08/14/2013   Anticipated DC Plan:  [place]  CM consult      Choice offered to / List presented to:             Status of service:   Medicare Important Message given?  NO (If response is "NO", the following Medicare IM given date fields will be blank) Date Medicare IM given:   Medicare IM given by:   Date Additional Medicare IM given:   Additional Medicare IM given by:    Discharge Disposition:  HOME/SELF CARE  Per UR Regulation:  Reviewed for med. necessity/level of care/duration of stay  If discussed at [place] of Stay Meetings, dates discussed:    Comments:

## 2013-08-14 NOTE — Progress Notes (Signed)
Nuc results reviewed with MD - Similar appearance to prior with septal hypokinesis and suspected scar along the cardiac apex anteroseptal wall. No inducible ischemia. Ejection fraction stable at 60%. Doubt symptoms are of cardiac significance. As previously outlined, the patient is uncomfortable with idea of being discharged without having some relief to his underlying problem of abdominal pain. Have spoken with the patient's gastroenterologist Dr. Henrene Pastor who will have his team see the patient in the morning. For tonight will exercise pain control and observe. Will made NPO after midnight just in case we anticipate procedure to be done. Nurse made aware of plan who helped inform patient and his family. They are in agreement of plan. Rourke Mcquitty PA-C

## 2013-08-14 NOTE — Progress Notes (Signed)
UR completed 

## 2013-08-14 NOTE — Progress Notes (Signed)
Performed neuro assessment on pt and did not find any abnormalities.

## 2013-08-14 NOTE — Progress Notes (Deleted)
Patient: [name] [name] / Admit Date: 08/13/2013 / Date of Encounter: 08/14/2013, 11:43 AM   Subjective: Continued abdominal pain, with increase in discomfort after Lexiscan administration. Some chest discomfort with this as well. EKG LBBB. 5lb weight loss in 2 weeks. No BRBPR, melena.   He states that last night 2 hours after eating, "everything hurt" including head.  Objective: Telemetry: NSR occ PVCs Physical Exam: Blood pressure 156/75, pulse 66, temperature 97.8 F (36.6 C), temperature source Oral, resp. rate 18, height 5\' 4"  (1.626 m), weight 140 lb 8 oz (63.73 kg), SpO2 98.00%. General: Well developed, well nourished elderly WM in no acute distress. Head: Normocephalic, atraumatic, sclera non-icteric, no xanthomas, nares are without discharge. Neck: JVP not elevated. Lungs: Clear bilaterally to auscultation without wheezes, rales, or rhonchi. Breathing is unlabored. Heart: RRR S1 S2 without murmurs, rubs, or gallops.  Abdomen: Soft, non-tender, non-distended with normoactive bowel sounds. No rebound/guarding. Extremities: No clubbing or cyanosis. No edema. Distal pedal pulses are 2+ and equal bilaterally. Neuro: Alert and oriented X 3. Moves all extremities spontaneously. Psych:  Responds to questions appropriately with a normal affect.   Intake/Output Summary (Last 24 hours) at 08/14/13 1143 Last data filed at 08/14/13 0525  Gross per 24 hour  Intake    240 ml  Output    275 ml  Net    -35 ml    Inpatient Medications:  . amLODipine  10 mg Oral Daily  . aspirin EC  81 mg Oral Daily  . heparin  5,000 Units Subcutaneous 3 times per day  . isosorbide dinitrate  20 mg Oral BID  . metoprolol  25 mg Oral BID  . pantoprazole  40 mg Oral BID  . regadenoson       Infusions:    Labs:  Recent Labs  08/13/13 1425 08/13/13 1917 08/14/13 0050  NA 141  --  138  K 4.2  --  4.0  CL 102  --  104  CO2 24  --  23  GLUCOSE 87  --  102*  BUN 16  --  14  CREATININE 1.19 1.21  1.26  CALCIUM 9.3  --  8.7    Recent Labs  08/12/13 0148  AST 20  ALT 20  ALKPHOS 66  BILITOT 0.9  PROT 7.3  ALBUMIN 3.8    Recent Labs  08/12/13 0148  08/13/13 1917 08/14/13 0050  WBC 5.7  < > 5.2 4.5  NEUTROABS 3.3  --   --   --   HGB 13.6  < > 13.7 12.5*  HCT 39.2  < > 40.8 36.9*  MCV 83.4  < > 85.7 84.8  PLT 220  < > 194 176  < > = values in this interval not displayed.  Recent Labs  08/12/13 0148 08/13/13 1917 08/14/13 0050 08/14/13 0635  TROPONINI <0.30 <0.30 <0.30 <0.30   No components found with this basename: POCBNP,  No results found for this basename: HGBA1C,  in the last 72 hours   Radiology/Studies:  Dg Chest 2 View  08/13/2013   CLINICAL DATA:  Midsternal chest pain, shortness of breath  EXAM: CHEST  2 VIEW  COMPARISON:  CT chest 08/12/2013 ; prior chest x-ray 09/28/2012  FINDINGS: Stable cardiac and mediastinal contours. Patient is status post median sternotomy with evidence of multivessel CABG including LIMA bypass. Low inspiratory volumes with mild bibasilar subsegmental atelectasis. No pneumothorax or pleural effusion. No acute osseous abnormality. Surgical clips in the right upper quadrant from prior cholecystectomy.  IMPRESSION: Low inspiratory volumes with mild bibasilar atelectasis.   Electronically Signed   By: Jacqulynn Cadet M.D.   On: 08/13/2013 15:22   Ct Angio Chest Pe W/cm &/or Wo Cm  08/12/2013   CLINICAL DATA:  Sudden onset of severe midline lower abdominal pain, radiating to the upper chest, with upper and mid back pain.  EXAM: CT ANGIOGRAPHY CHEST AND ABDOMEN  TECHNIQUE: Multidetector CT imaging of the chest and abdomen was performed using the standard protocol during bolus administration of intravenous contrast. Multiplanar CT image reconstructions and MIPs were obtained to evaluate the vascular anatomy.  CONTRAST:  122mL OMNIPAQUE IOHEXOL 350 MG/ML SOLN  COMPARISON:  None.  CTA of the abdomen and pelvis from 08/01/2013, and CTA of the  chest performed 10/04/2011  FINDINGS: CTA CHEST FINDINGS  There is no evidence of aortic dissection. No aneurysmal dilatation is seen along the thoracic aorta. Minimal calcific atherosclerotic disease is noted along the aortic arch, without evidence of intramural thrombus. The great vessels are grossly unremarkable in appearance, aside from mild intramural thrombus along the proximal left subclavian artery.  There is no evidence of pulmonary embolus.  Minimal bibasilar atelectasis is noted. A 5 mm pleural-based nodule on the right side is stable from 2013 and likely benign. The lungs are otherwise clear. There is no evidence of significant focal consolidation, pleural effusion or pneumothorax. No masses are identified; no abnormal focal contrast enhancement is seen.  The mediastinum is unremarkable in appearance. No mediastinal lymphadenopathy is seen. No pericardial effusion is identified. Diffuse coronary artery calcifications are seen. The patient is status post median sternotomy. No axillary lymphadenopathy is seen. The thyroid gland is unremarkable in appearance.  No acute osseous abnormalities are seen.  Review of the MIP images confirms the above findings.  CTA ABDOMEN FINDINGS  There is no evidence of aortic dissection. Mild intramural thrombus is noted along the course of the abdominal aorta, and scattered calcification is seen along the abdominal aorta and its branches. There is mild luminal narrowing at the aortic bifurcation and both proximal common iliac arteries due to mild intramural thrombus.  No aneurysmal dilatation is seen along the abdominal aorta. There is mild to moderate luminal narrowing at the origin of the celiac trunk, reflecting intramural thrombus. There is also mild narrowing near the origin of the superior mesenteric artery, reflecting intramural thrombus. There is mild narrowing at the origin of the right renal artery, due to intramural thrombus. The left renal artery is grossly  unremarkable in appearance. The inferior mesenteric artery remains patent.  The inferior vena cava is grossly unremarkable in appearance.  Scattered hypodensities are seen within the liver, nonspecific but likely reflecting multiple small cysts. These measure up to 2.6 cm in size, and are grossly stable from prior studies. The spleen is unremarkable in appearance. The patient is status post cholecystectomy, with clips noted at the gallbladder fossa. The pancreas and adrenal glands are unremarkable.  Multiple prominent bilateral renal cysts are seen, measuring up to 6.7 cm in size. The kidneys are otherwise grossly unremarkable. There is no evidence of hydronephrosis. No renal or ureteral stones are identified. A few left-sided renal parapelvic cysts are noted. Mild nonspecific perinephric stranding is noted bilaterally.  No free fluid is identified. The small bowel is unremarkable in appearance. The stomach is within normal limits. No acute vascular abnormalities are seen.  The appendix is normal in caliber, without evidence for appendicitis. The colon is partially filled stool and is unremarkable in appearance, though the  distal sigmoid colon is not imaged on this study.  No acute osseous abnormalities are identified.  IMPRESSION: 1. No evidence of aortic dissection. 2. No evidence of aneurysmal dilatation of the thoracic or abdominal aorta. 3. No evidence of pulmonary embolus. 4. Mild intramural thrombus noted along the course of the abdominal aorta, with scattered calcification. Mild luminal narrowing at the aortic bifurcation and both proximal common iliac arteries, mild to moderate luminal narrowing at the origin of the celiac trunk, mild narrowing near the origin of the superior mesenteric artery, and mild narrowing at the origin of the right renal artery, due to intramural thrombus. Mild intramural thrombus also noted along the proximal left subclavian artery. 5. Minimal bibasilar atelectasis noted; lungs  otherwise grossly clear. 6. Diffuse coronary artery calcifications seen. 7. Prominent bilateral renal cysts. 8. Scattered hypodensities within the liver are nonspecific but likely reflect multiple small cysts, similar in appearance to prior studies.   Electronically Signed   By: Garald Balding M.D.   On: 08/12/2013 07:08   Ct Cta Abd/pel W/cm &/or W/o Cm  08/12/2013   CLINICAL DATA:  Sudden onset of severe midline lower abdominal pain, radiating to the upper chest, with upper and mid back pain.  EXAM: CT ANGIOGRAPHY CHEST AND ABDOMEN  TECHNIQUE: Multidetector CT imaging of the chest and abdomen was performed using the standard protocol during bolus administration of intravenous contrast. Multiplanar CT image reconstructions and MIPs were obtained to evaluate the vascular anatomy.  CONTRAST:  137mL OMNIPAQUE IOHEXOL 350 MG/ML SOLN  COMPARISON:  None.  CTA of the abdomen and pelvis from 08/01/2013, and CTA of the chest performed 10/04/2011  FINDINGS: CTA CHEST FINDINGS  There is no evidence of aortic dissection. No aneurysmal dilatation is seen along the thoracic aorta. Minimal calcific atherosclerotic disease is noted along the aortic arch, without evidence of intramural thrombus. The great vessels are grossly unremarkable in appearance, aside from mild intramural thrombus along the proximal left subclavian artery.  There is no evidence of pulmonary embolus.  Minimal bibasilar atelectasis is noted. A 5 mm pleural-based nodule on the right side is stable from 2013 and likely benign. The lungs are otherwise clear. There is no evidence of significant focal consolidation, pleural effusion or pneumothorax. No masses are identified; no abnormal focal contrast enhancement is seen.  The mediastinum is unremarkable in appearance. No mediastinal lymphadenopathy is seen. No pericardial effusion is identified. Diffuse coronary artery calcifications are seen. The patient is status post median sternotomy. No axillary  lymphadenopathy is seen. The thyroid gland is unremarkable in appearance.  No acute osseous abnormalities are seen.  Review of the MIP images confirms the above findings.  CTA ABDOMEN FINDINGS  There is no evidence of aortic dissection. Mild intramural thrombus is noted along the course of the abdominal aorta, and scattered calcification is seen along the abdominal aorta and its branches. There is mild luminal narrowing at the aortic bifurcation and both proximal common iliac arteries due to mild intramural thrombus.  No aneurysmal dilatation is seen along the abdominal aorta. There is mild to moderate luminal narrowing at the origin of the celiac trunk, reflecting intramural thrombus. There is also mild narrowing near the origin of the superior mesenteric artery, reflecting intramural thrombus. There is mild narrowing at the origin of the right renal artery, due to intramural thrombus. The left renal artery is grossly unremarkable in appearance. The inferior mesenteric artery remains patent.  The inferior vena cava is grossly unremarkable in appearance.  Scattered hypodensities are seen  within the liver, nonspecific but likely reflecting multiple small cysts. These measure up to 2.6 cm in size, and are grossly stable from prior studies. The spleen is unremarkable in appearance. The patient is status post cholecystectomy, with clips noted at the gallbladder fossa. The pancreas and adrenal glands are unremarkable.  Multiple prominent bilateral renal cysts are seen, measuring up to 6.7 cm in size. The kidneys are otherwise grossly unremarkable. There is no evidence of hydronephrosis. No renal or ureteral stones are identified. A few left-sided renal parapelvic cysts are noted. Mild nonspecific perinephric stranding is noted bilaterally.  No free fluid is identified. The small bowel is unremarkable in appearance. The stomach is within normal limits. No acute vascular abnormalities are seen.  The appendix is normal in  caliber, without evidence for appendicitis. The colon is partially filled stool and is unremarkable in appearance, though the distal sigmoid colon is not imaged on this study.  No acute osseous abnormalities are identified.  IMPRESSION: 1. No evidence of aortic dissection. 2. No evidence of aneurysmal dilatation of the thoracic or abdominal aorta. 3. No evidence of pulmonary embolus. 4. Mild intramural thrombus noted along the course of the abdominal aorta, with scattered calcification. Mild luminal narrowing at the aortic bifurcation and both proximal common iliac arteries, mild to moderate luminal narrowing at the origin of the celiac trunk, mild narrowing near the origin of the superior mesenteric artery, and mild narrowing at the origin of the right renal artery, due to intramural thrombus. Mild intramural thrombus also noted along the proximal left subclavian artery. 5. Minimal bibasilar atelectasis noted; lungs otherwise grossly clear. 6. Diffuse coronary artery calcifications seen. 7. Prominent bilateral renal cysts. 8. Scattered hypodensities within the liver are nonspecific but likely reflect multiple small cysts, similar in appearance to prior studies.   Electronically Signed   By: Garald Balding M.D.   On: 08/12/2013 07:08   Ct Angio Abd/pel W/ And/or W/o  08/01/2013   CLINICAL DATA:  Abdominal pain. Back pain. Diarrhea. Evaluate for mesenteric ischemia  EXAM: CT ANGIOGRAPHY ABDOMEN AND PELVIS WITH CONTRAST AND WITHOUT CONTRAST  TECHNIQUE: Multidetector CT imaging of the abdomen and pelvis was performed using the standard protocol during bolus administration of intravenous contrast. Multiplanar reconstructed images and MIPs were obtained and reviewed to evaluate the vascular anatomy.  CONTRAST:  166mL OMNIPAQUE IOHEXOL 350 MG/ML SOLN  COMPARISON:  03/27/2013  FINDINGS: The aorta is non aneurysmal and patent with moderate diffuse atherosclerotic plaque.  There is 50% narrowing at the origin of the  celiac axis. Branch vessels are patent.  There is 50% narrowing in the proximal SMA secondary to focal plaque just beyond the origin. Branch vessels are grossly patent.  Moderate narrowing at the origin of the IMA. Branch vessels are patent  Moderate atherosclerotic plaque with 50% narrowing at the origin of the right renal artery. The left renal artery is widely patent.  50% narrowing at the origin of the right common iliac artery. Right internal and external iliac arteries are widely patent. 50% narrowing involving the proximal 2 cm of the left common iliac artery. Left internal and external iliac arteries are patent.  Stable intra-abdominal organs. Scattered lumbar degenerative disc disease.  Review of the MIP images confirms the above findings.  IMPRESSION: There is 50% narrowing at the origin of the celiac and SMA. The clinical significance of this is indeterminate. If the patient does have a history of postprandial pain and weight loss, consider conventional angiography with pressure gradient measurements.  Electronically Signed   By: Maryclare Bean M.D.   On: 08/01/2013 08:59     Assessment and Plan  1. Chest pain with h/o CAD s/p remote CABG 2. Abdominal pain ? Mesenteric ischemia by CT earlier this month 3. HTN 4. Carotid artery disease  Pt seen briefly in nuc. See consult note. Per note "In general, he has been in and out of the emergency room several times and his wife really wants an answer to why he is feeling the way he is feeling. Most of the time he states that he has abdominal discomfort that seems to radiate up to his chest. He also states that he is always dizzy and was told that he had vertigo." BP elevated down in nuc but has been variable. Await further plans per MD if nuclear stress test is normal. Pt is extremely reluctant to be discharged because he states he will just end up coming back up to the ER. Consider GI consult (saw Dr. Henrene Pastor with GI) or vascular consult during this admission  so we can try to pinpoint a diagnosis here. May need repeat endoscopy vs further workup for his abdominal PVD (see 7/10 CT).  Signed, Melina Copa PA-C

## 2013-08-15 DIAGNOSIS — I700 Atherosclerosis of aorta: Secondary | ICD-10-CM | POA: Diagnosis not present

## 2013-08-15 DIAGNOSIS — R1030 Lower abdominal pain, unspecified: Secondary | ICD-10-CM | POA: Diagnosis present

## 2013-08-15 DIAGNOSIS — R109 Unspecified abdominal pain: Secondary | ICD-10-CM

## 2013-08-15 DIAGNOSIS — R0789 Other chest pain: Secondary | ICD-10-CM

## 2013-08-15 DIAGNOSIS — I209 Angina pectoris, unspecified: Secondary | ICD-10-CM | POA: Diagnosis not present

## 2013-08-15 DIAGNOSIS — I2510 Atherosclerotic heart disease of native coronary artery without angina pectoris: Secondary | ICD-10-CM | POA: Diagnosis not present

## 2013-08-15 MED ORDER — DICYCLOMINE HCL 10 MG PO CAPS: 10.0000 mg | ORAL_CAPSULE | Freq: Three times a day (TID) | ORAL | Status: DC

## 2013-08-15 MED ORDER — DICYCLOMINE HCL 10 MG PO CAPS
10.0000 mg | ORAL_CAPSULE | Freq: Three times a day (TID) | ORAL | Status: DC
  Filled 2013-08-15 (×3): qty 1

## 2013-08-15 NOTE — Progress Notes (Signed)
    SUBJECTIVE: No chest pain and he says that his abdomen is not bad this morning.     PHYSICAL EXAM Filed Vitals:   08/14/13 1430 08/14/13 1959 08/15/13 0500 08/15/13 1040  BP: 126/73 127/72 122/66 144/70  Pulse: 69 85 68 64  Temp: 97.5 F (36.4 C) 97.6 F (36.4 C) 97.9 F (36.6 C)   TempSrc: Oral Oral Oral   Resp: 16 18 18    Height:      Weight:   140 lb 9.6 oz (63.776 kg)   SpO2: 100% 100% 99%    General:  No distress Lungs:  Clear Heart:  RRR Abdomen:  Positive bowel sounds, no rebound no guarding Extremities:  No edema  LABS: Lab Results  Component Value Date   TROPONINI <0.30 08/14/2013   No results found for this or any previous visit (from the past 24 hour(s)).  Intake/Output Summary (Last 24 hours) at 08/15/13 1050 Last data filed at 08/15/13 0555  Gross per 24 hour  Intake    240 ml  Output    100 ml  Net    140 ml    ASSESSMENT AND PLAN:  CHEST PAIN:  Negative cardiac biomarkers.   Similar appearance to prior with septal hypokinesis and suspected scar along the cardiac apex anteroseptal wall. No inducible ischemia. Ejection fraction stable at 60%. Doubt symptoms are of cardiac significance.  No further cardiac work up.  Home if no inpatient GI work up planned.   HYPERCHOLESTEROLEMIA, PURE:  Continue current therapy.    HYPERTENSION, BENIGN:  Continue current therapy.    CAD, ARTERY BYPASS GRAFT:  See above.    ABDOMINAL PAIN-EPIGASTRIC:  GI is currently seeing the patient.      Jeneen Rinks [facility] 08/15/2013 10:50 AM

## 2013-08-15 NOTE — Consult Note (Signed)
[facility] Consult: 8:18 AM 08/15/2013  LOS: 2 days    Referring Provider: Dr Percival Spanish  Primary Care Physician:  Leonides Sake, MD Primary Gastroenterologist:  Dr. Henrene Pastor     Reason for Consultation:  Chest pain.     HPI: [name] [name] is a 73 y.o. male.  CAD with CABG 2002 and stenting.  Further stenting of LCX in 12/2009.  Latest cardiac cath 09/2012 demonstrated patent stents and patent bypass grafts, med Rx was continued.  Hx HTN, Carotid stenosis s/p left CEA 03/2011, memory loss, prosatate CA s/p cryotherapy, vertigo.  Idiopathic pancreatitis in 2011 with unrevealing ERCP Deatra Ina).  S/P cholecystectomy. Several years of chest and abdominal pain.   Colonoscopy and EGD 2011: left sided diverticulosis, esophageal ring was dilated.  CT in 2013: SB enteritis and advanced atherosclerotic disease of the aorta and branch vessels confirmed. The study was not performed in the arterial phase however the main branches appeared patent. ? ACE induced angiodema so ACEI discontinued.   Abdominal pain and nausea for at least a year worked up with contrasted CT 05/2012:  Renal and hepatic cysts, non-obstructing left kidney stone.  CT angio of 03/2013: Mild ostial narrowings of the celiac, SMA and IMA origins but no significant ostial or proximal mesenteric vascular occlusive process. All 3 mesenteric vessels remain patent.  Mild right renal artery narrowing and moderate aortoiliac atherosclerosis without occlusive disease or iliac inflow disease  Seen in ED 7/10, 7/21 for abdominal pain, diaphoresis, nausea.  Most intense episode ever.  Pain worse with eating and even after drinking water.  Tends to be worse about one hour post prandial.  GI sxs can awaken him at night.  CT angio abdomen and pelvis: 50% narrowing at the origin  of the celiac and SMA.  Lipase 65, LFTS normal.  7/21: CT angio chest, abdomen. Mild intramural thrombus of abdominal aorta with scattered calcification. Mild luminal narrowing at the aortic bifurcation and both proximal common iliac arteries, mild to moderate luminal narrowing at the origin of the celiac trunk, mild narrowing near the origin of the superior mesenteric artery, and mild narrowing at the origin of the right renal artery, due to intramural thrombus. Mild intramural thrombus also noted along the proximal left subclavian artery.  Stable, small cyst like structures in liver and kidneys.  Admitted 2 days ago with ongoing, acute on chronic chest pain.  Increased with moderate activity and emotional stress. + relief with SL NTG but just PTA NTG unable to relive the pain.  Cardiac enzymes negative.  Experienced chest pain during [place].  Study shows "similar appearance to prior with septal hypokinesis and suspected scar along the cardiac apex anteroseptal wall. No inducible ischemia. Ejection fraction stable at 60%. Doubt symptoms are of cardiac significance." GI consult sought to try to sort out the issues which are plaguing pt.   Weighed 242# in 03/2011, at 140 - 143 currently. Takes Protonix BID, Pepcid BID, Reglan PRN. No longer on Plavix.  Chews tobacco, 1PPD but previously up to 3 PPD. No ETOH.  No NSAIDs.  BMs daily, no blood or diarrhea.  Some nausea but no emesis or dry heaves.  + increased flatus.  Gets pruritus, "all over", this occurs indoors and outdoors.  Never able to see bites or rash on skin. He is illiterate but able to "count money", unable to read or write.  No headaches.     Past Medical History  Diagnosis Date  . Hypertension   . Pancreatitis Dec 2011    ERCP ok.  . Hiatal hernia   . Chronic chest pain   . Left bundle branch block   . GERD (gastroesophageal reflux disease)   . Hyperlipidemia   . Statin intolerance   . Esophageal stricture   . Renal cyst      Seen on CT 08/2011 also with circumferential bladder wall thickening  . Anginal pain   . Anemia 10/06/2011  . Colon polyp     adenomatous  . Hemorrhoids   . Diverticulosis   . Atrial fibrillation   . Coronary artery disease     a. S/p CABG in 2001. b. S/p DES to protected LM and BMS to RCA 2006. c. 12/2009: s/p DES to Cx;  d. 09/2011 Cath: patent stents, patent grafts -->Med Rx.;  e. CP with abnl Nuc => LHC 10/04/12: oLM 30%, dLM stent into the CFX patent, LAD occluded, LIMA-LAD patent, RI 30%, mid AVCFX 30%, oOM1 50%, oRCA stent patent, mRCA 50-60%, Radial graft-Dx patent; EF 55%.=> Med Rx  . Carotid stenosis 04/10/11    a. s/p left carotid endarterectomy 04/14/2011.;  b.  Carotid US (11/13):  L CEA ok; RICA 1-39%  . Prostate cancer     s/p cryoablation  . Skin cancer     "cut it off my right ear"  . Chronic bronchitis     "get it q yr"  . Daily headache     "for the last couple weeks" (08/13/2013)  . Arthritis     "joints hurt; shoulders, arms, back" (08/13/2013)  . Chronic lower back pain     Past Surgical History  Procedure Laterality Date  . Cholecystectomy    . Endarterectomy  04/14/2011    Procedure: ENDARTERECTOMY CAROTID;  Surgeon: Mal Misty, MD;  Location: [facility];  Service: Vascular;  Laterality: Left;  Would like to perform procedure first, at 0730  . Pr vein bypass graft,aorto-fem-pop    . Inguinal hernia repair Bilateral   . Cardiac catheterization    . Coronary angioplasty with stent placement      "1 + 1"  . Coronary artery bypass graft  2001  . Prostate cryoablation      Prior to Admission medications   Medication Sig Start Date End Date Taking? Authorizing Provider  amLODipine (NORVASC) 10 MG tablet Take 10 mg by mouth daily.   Yes Historical Provider, MD  aspirin EC 81 MG tablet Take 81 mg by mouth at bedtime.    Yes Historical Provider, MD  diphenhydrAMINE (BENADRYL) 25 MG tablet Take 25 mg by mouth every 8 (eight) hours as needed for allergies.   Yes  Historical Provider, MD  famotidine (PEPCID) 20 MG tablet Take 1 tablet (20 mg total) by mouth 2 (two) times daily. 08/12/13  Yes Johnna Acosta, MD  isosorbide dinitrate (ISORDIL) 20 MG tablet Take 20 mg by mouth 2 (two) times daily.   Yes Historical Provider, MD  metoCLOPramide (REGLAN) 10 MG tablet Take 5 mg by mouth 4 (four) times daily as needed. Stomach pain   Yes Historical Provider, MD  metoprolol (LOPRESSOR) 50 MG tablet Take 25 mg by mouth 2 (two) times daily.    Yes Historical Provider, MD  nitroGLYCERIN (NITROSTAT) 0.4 MG SL tablet Place 1 tablet (0.4 mg total) under the tongue every 5 (five) minutes as needed. For chest pain 07/21/13  Yes Scott Joylene Draft, PA-C  pantoprazole (PROTONIX) 40 MG tablet Take 40 mg by mouth 2 (two) times daily.    Yes Historical Provider, MD    Scheduled Meds: . amLODipine  10 mg Oral Daily  . aspirin EC  81 mg Oral Daily  . heparin  5,000 Units Subcutaneous 3 times per day  . isosorbide dinitrate  20 mg Oral BID  . metoprolol  25 mg Oral BID  . pantoprazole  40 mg Oral BID   Infusions:   PRN Meds: acetaminophen, ALPRAZolam, fentaNYL, metoCLOPramide, nitroGLYCERIN, ondansetron (ZOFRAN) IV   Allergies as of 08/13/2013 - Review Complete 08/13/2013  Allergen Reaction Noted  . Amoxicillin-pot clavulanate    . Codeine Nausea And Vomiting   . Erythromycin Other (See Comments) 10/18/2011  . Hydrocodone Nausea And Vomiting 03/04/2011  . Morphine Nausea And Vomiting   . Nitrofuran derivatives Other (See Comments) 03/04/2011  . Oxycodone hcl Nausea And Vomiting   . Shellfish allergy Swelling 03/04/2011  . Tramadol Nausea And Vomiting 02/01/2010  . Zolpidem tartrate Other (See Comments)     Family History  Problem Relation Age of Onset  . Heart disease Mother     Heart Disease before age 56  . Hypertension Mother   . Heart attack Mother   . Cancer Father   . Hypertension Sister   . Heart disease Sister     Heart Disease before age 82  . Cancer  Sister   . Heart attack Sister   . Hypertension Brother   . Heart disease Brother     Heart Disease before age 33  . Heart attack Brother     History   Social History  . Marital Status: Married    Spouse Name: N/A    Number of Children: 2  . Years of Education: N/A   Occupational History  . Retired    Social History Main Topics  . Smoking status: Former Smoker -- .1 years    Types: Cigarettes  . Smokeless tobacco: Current User    Types: Chew     Comment: "smoked a few cigarettes; no more than 1 month"  . Alcohol Use: Yes     Comment: "no alcohol since I was a teenager"  . Drug Use: No  . Sexual Activity: No   Other Topics Concern  . Not on file   Social History Narrative  . No narrative on file    REVIEW OF SYSTEMS: Constitutional:  Per HPI ENT:  No nose bleeds Pulm:  Chronic dyspnea, that varies day to day..  CV:  No palpitations, occasional LE edema.  GU:  No hematuria, no frequency GI:  Per HPI Heme:  No anemia.  No unusual bleeding or bruising.   Transfusions:  none Neuro:  No headaches, no peripheral tingling or numbness.  + dizziness/spinning sensation.  Derm:  No itching, no rash or sores.  Endocrine:  No sweats or chills.  No polyuria or dysuria Immunization:  Not queried.  Travel:  None beyond local counties in last few months.    PHYSICAL EXAM: Vital signs in last 24 hours: Filed Vitals:   08/15/13 0500  BP: 122/66  Pulse: 68  Temp: 97.9 F (36.6 C)  Resp:  18   Wt Readings from Last 3 Encounters:  08/15/13 63.776 kg (140 lb 9.6 oz)  08/12/13 65.006 kg (143 lb 5 oz)  07/31/13 64.864 kg (143 lb)    General: small older WM who looks well.  He is comfortable Head:  No asymmetry or swellling  Eyes:  No icterus or pallor Ears:  nont HOH  Nose:  No congestion or discharge Mouth:  Clear, moist Neck:  No mass, no TMG, no bruits Lungs:  Clear bil.  No cough or dyspnea Heart: RRR.  No MRG Abdomen:  Soft, NT, ND.  No mass.  BS active.  No  bruits.   Rectal: deferred   Musc/Skeltl: no joint swelling or erythema.  Extremities:  No CCE  Neurologic:  No tremor, oriented x 3, alert.  No limb weakness Skin:  No sores, rash or telangectasia.  Tattoos:  none Nodes:  No cervical adenopathy.    Psych:  Cooperative, pleasant, relaxed.   Intake/Output from previous day: 07/23 0701 - 07/24 0700 In: 240 [P.O.:240] Out: 100 [Urine:100] Intake/Output this shift:    LAB RESULTS:  Recent Labs  08/13/13 1425 08/13/13 1917 08/14/13 0050  WBC 6.5 5.2 4.5  HGB 14.0 13.7 12.5*  HCT 40.8 40.8 36.9*  PLT 215 194 176   BMET Lab Results  Component Value Date   NA 138 08/14/2013   NA 141 08/13/2013   NA 138 08/12/2013   K 4.0 08/14/2013   K 4.2 08/13/2013   K 3.6* 08/12/2013   CL 104 08/14/2013   CL 102 08/13/2013   CL 102 08/12/2013   CO2 23 08/14/2013   CO2 24 08/13/2013   CO2 24 08/12/2013   GLUCOSE 102* 08/14/2013   GLUCOSE 87 08/13/2013   GLUCOSE 128* 08/12/2013   BUN 14 08/14/2013   BUN 16 08/13/2013   BUN 19 08/12/2013   CREATININE 1.26 08/14/2013   CREATININE 1.21 08/13/2013   CREATININE 1.19 08/13/2013   CALCIUM 8.7 08/14/2013   CALCIUM 9.3 08/13/2013   CALCIUM 9.1 08/12/2013   LFT No results found for this basename: PROT, ALBUMIN, AST, ALT, ALKPHOS, BILITOT, BILIDIR, IBILI,  in the last 72 hours PT/INR Lab Results  Component Value Date   INR 0.98 08/13/2013   INR 1.03 10/03/2012   INR 1.08 10/04/2011   Hepatitis Panel No results found for this basename: HEPBSAG, HCVAB, HEPAIGM, HEPBIGM,  in the last 72 hours Lipase     Component Value Date/Time   LIPASE 63* 08/12/2013 0148     RADIOLOGY STUDIES: Dg Chest 2 View 08/13/2013   CLINICAL DATA:  Midsternal chest pain, shortness of breath  EXAM: CHEST  2 VIEW  COMPARISON:  CT chest 08/12/2013 ; prior chest x-ray 09/28/2012  FINDINGS: Stable cardiac and mediastinal contours. Patient is status post median sternotomy with evidence of multivessel CABG including LIMA bypass. Low  inspiratory volumes with mild bibasilar subsegmental atelectasis. No pneumothorax or pleural effusion. No acute osseous abnormality. Surgical clips in the right upper quadrant from prior cholecystectomy.  IMPRESSION: Low inspiratory volumes with mild bibasilar atelectasis.   Electronically Signed   By: Jacqulynn Cadet M.D.   On: 08/13/2013 15:22   Nm Myocar Multi W/spect W/wall Motion / Ef 08/14/2013    COMPARISON:  10/03/2012.  FINDINGS: Myocardial perfusion study: Small matched defect at the cardiac apex along the anteroseptal wall with mildly reduced stress and rest activity. No inducible ischemia.  Ejection fraction calculation: End-diastolic volume is seventy-two mL. End systolic volume is 29 mL. Derived left  ventricular ejection fraction of 60%.  Wall motion analysis: Moderate septal hypokinesis  IMPRESSION: 1. Similar appearance to prior with septal hypokinesis and suspected scar along the cardiac apex anteroseptal wall. No inducible ischemia. Ejection fraction stable at 60%.   Electronically Signed   By: Sherryl Barters M.D.   On: 08/14/2013 16:53    ENDOSCOPIC STUDIES: Per HPI.   IMPRESSION:   *  Acute on chronic abdominal pain with findings of non-obstructing mesenteric vascular disease on multitude of CT studies (including CT angios x 3 since March 2015).  sxs worse post prandial.  Abdominal aortic atherosclerosis ? Vascular etiology.  ? Functional sxs.  Doubt PUD or gastritis as he is on max PPI and H2 blocker.   *  Small liver cysts  *  Idiopathic pancreatitis with unrevealing ERCP in 2011. Lipase in 60s.  Pancreas normal on CT.   *  CAD, CABG 2002 and subsequent stenting.  Latest DES placed 12/2009. Lexiscan with stable findings.  Chest pain felt to be non-cardiac.   *  Anxiety.  Admits to frequent worry about almost anything.    PLAN:     *  ? Vascular surgery consult for opinion as to whether this is ischemic.  Does he need formal angiogram? Pt is NPO in case Dr Leroy Kennedy  decides to pursue EGD.    Azucena Freed  08/15/2013, 8:18 AM Pager: [phone]     [facility] Attending  I have also seen and assessed the patient and agree with the above note. He has had years of intermittent abdominal and chest pain without clear causes. He may have some ischemia in gut but not classic. Mildly elevated lipase does not mean he has pancreatitis.  I do not think further investigation needed now. Will feed and see how he does with anti-cholinergic (dicyclomine 10mg ) qac. If that is ok would just keep appointment with vascular surgery he has in Aug. If persistent problems consider angiogram vs. Functional mesenteric study with dopplers He asked about tick-borne illness - denies fever - had 5 ticks on him in past month. I do not think that is an issue.   Please let us know if you think we need to see him again while he is here. Dr. Benson Norway on call for weekend.  Gatha Mayer, MD, [facility] [phone] (pager) 08/15/2013 1:00 PM

## 2013-08-15 NOTE — Discharge Summary (Signed)
Physician Discharge Summary      Patient ID: [name] [name] MRN: [id number] DOB/AGE: 1940-10-30 73 y.o.  Admit date: 08/13/2013 Discharge date: 08/15/2013  Admission Diagnoses:   Unstable angina  Discharge Diagnoses:  Principal Problem:   Unstable angina Active Problems:   HYPERCHOLESTEROLEMIA, PURE   HYPERTENSION, BENIGN   CAD, ARTERY BYPASS GRAFT   LBBB   ABDOMINAL PAIN-EPIGASTRIC   Abdominal pain, lower   Discharged Condition: stable  Hospital Course:   73 year old male with coronary artery disease, remote bypass surgery with prior stenting of the left main, RCA, left circumflex with last catheterization on 10/04/12 performed by Dr. Haroldine Laws demonstrating:  1. Continued patency of the IMA to the LAD  2. Continued patency of the radial to the diagonal  3. Continue patency of the LMCA and CFX stents with mild narrowing of the OM1 as in 2011  4. Continued patency of the ostial RCA stent as noted.  5. Preserved overall LV function  6. 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 He was last seen in the office on 07/21/13 by Richardson Dopp, PA and reported to continue to have chest discomfort with certain activities as well as increased emotional stress. He also described abdominal pain that often occurred with positional changes and radiates up to his chest. He has seen by gastroenterology and has had CT scans evaluating this.   Currently he describes an episode while walking outside, carrying out his trash as chest discomfort that was moderate in intensity and fairly constant with a sensation of shortness of breath that radiated up to his neck bilaterally. He took 2 sublingual nitroglycerin with significant improvement. After he went back inside, sat down, his pain began to increase once again and nitroglycerin did not help it. He came to the emergency department for  further evaluation. Currently his pain is fairly benign.  Interestingly, he came to the emergency room yesterday for abdominal pain.  The patient was admitted for observation.  NTG drip was discontinued because it was not helping and caused a HA.  He ruled out for MI.   Nuclear stress test was completed and read as "similar appearance to prior with septal hypokinesis and suspected scar along the cardiac apex anteroseptal wall. No inducible ischemia. Ejection fraction stable at 60%"  GI was consult for Abd pain and recommended seeing how he does with anti-cholinergic (dicyclomine 10mg ) qac.  If ok, keep appointment with vascular surgery in Aug. If persistent problems consider angiogram vs. Functional mesenteric study with dopplers.  The patient was seen by Dr. Percival Spanish who felt he was stable for DC home.    Consults: GI  Significant Diagnostic Studies:  MYOCARDIAL IMAGING WITH SPECT (REST AND PHARMACOLOGIC-STRESS - 2 DAY  PROTOCOL)  GATED LEFT VENTRICULAR WALL MOTION STUDY  LEFT VENTRICULAR EJECTION FRACTION  TECHNIQUE:  Standard myocardial SPECT imaging was performed after resting  intravenous injection of 10 mCi Tc-60m sestamibi. Subsequently, on a  second day, intravenous infusion of Lexiscan was performed under the  supervision of the Cardiology staff. At peak effect of the drug, 30  mCi Tc-72m sestamibi was injected intravenously and standard  myocardial SPECT imaging was performed. Quantitative gated imaging  was also performed to evaluate left ventricular wall motion, and  estimate left ventricular ejection fraction.  COMPARISON: 10/03/2012.  FINDINGS:  Myocardial perfusion study: Small matched defect at the  cardiac apex  along the anteroseptal wall with mildly reduced stress and rest  activity. No inducible ischemia.  Ejection fraction calculation: End-diastolic volume is seventy-two  mL. End systolic volume is 29 mL. Derived left ventricular ejection  fraction of 60%.  Wall  motion analysis: Moderate septal hypokinesis  IMPRESSION:  1. Similar appearance to prior with septal hypokinesis and suspected  scar along the cardiac apex anteroseptal wall. No inducible  ischemia. Ejection fraction stable at 60%.    Treatments:  See above  Discharge Exam: Blood pressure 144/70, pulse 64, temperature 97.9 F (36.6 C), temperature source Oral, resp. rate 18, height 5\' 4"  (1.626 m), weight 140 lb 9.6 oz (63.776 kg), SpO2 99.00%.   Disposition: 01-Home or Self Care      Discharge Instructions   Diet - low sodium heart healthy    Complete by:  As directed      Discharge instructions    Complete by:  As directed   Side effects of dicyclomine: dry mouth, blurred vision, confusion, agitation, increased heart rate, heart palpitations, constipation, difficulty urinating, seizures            Medication List         amLODipine 10 MG tablet  Commonly known as:  NORVASC  Take 10 mg by mouth daily.     aspirin EC 81 MG tablet  Take 81 mg by mouth at bedtime.     dicyclomine 10 MG capsule  Commonly known as:  BENTYL  Take 1 capsule (10 mg total) by mouth 4 (four) times daily -  before meals and at bedtime.     diphenhydrAMINE 25 MG tablet  Commonly known as:  BENADRYL  Take 25 mg by mouth every 8 (eight) hours as needed for allergies.     famotidine 20 MG tablet  Commonly known as:  PEPCID  Take 1 tablet (20 mg total) by mouth 2 (two) times daily.     isosorbide dinitrate 20 MG tablet  Commonly known as:  ISORDIL  Take 20 mg by mouth 2 (two) times daily.     metoCLOPramide 10 MG tablet  Commonly known as:  REGLAN  Take 5 mg by mouth 4 (four) times daily as needed. Stomach pain     metoprolol 50 MG tablet  Commonly known as:  LOPRESSOR  Take 25 mg by mouth 2 (two) times daily.     nitroGLYCERIN 0.4 MG SL tablet  Commonly known as:  NITROSTAT  Place 1 tablet (0.4 mg total) under the tongue every 5 (five) minutes as needed. For chest pain      pantoprazole 40 MG tablet  Commonly known as:  PROTONIX  Take 40 mg by mouth 2 (two) times daily.       Follow-up Information   Schedule an appointment as soon as possible for a visit with Tinnie Gens, MD.   Specialty:  Vascular Surgery   Contact information:   [address] [place] [address] [phone]       Schedule an appointment as soon as possible for a visit with Scarlette Shorts, MD.   Specialty:  Gastroenterology   Contact information:   [address] [place] [address] [phone]       Follow up with Richardson Dopp, PA-C On 09/03/2013. (2pm)    Specialty:  Physician Assistant   Contact information:   [address] [place] [address] [phone]      Greater than 30 minutes was spent completing the patient's  discharge.   SignedTarri Fuller, PA-C 08/15/2013, 1:50 PM  Patient seen and examined.  Plan as discussed in my rounding note for today and outlined above. [name] [name]  08/15/2013  4:29 PM

## 2013-08-25 ENCOUNTER — Encounter: Payer: Self-pay | Admitting: Vascular Surgery

## 2013-08-26 ENCOUNTER — Ambulatory Visit (INDEPENDENT_AMBULATORY_CARE_PROVIDER_SITE_OTHER): Payer: Medicare Other | Admitting: Vascular Surgery

## 2013-08-26 ENCOUNTER — Encounter: Payer: Self-pay | Admitting: Vascular Surgery

## 2013-08-26 VITALS — BP 141/63 | HR 73 | Ht 64.0 in | Wt 143.5 lb

## 2013-08-26 DIAGNOSIS — G8929 Other chronic pain: Secondary | ICD-10-CM

## 2013-08-26 DIAGNOSIS — Z48812 Encounter for surgical aftercare following surgery on the circulatory system: Secondary | ICD-10-CM | POA: Diagnosis not present

## 2013-08-26 DIAGNOSIS — I6529 Occlusion and stenosis of unspecified carotid artery: Secondary | ICD-10-CM

## 2013-08-26 DIAGNOSIS — R1013 Epigastric pain: Secondary | ICD-10-CM

## 2013-08-26 NOTE — Addendum Note (Signed)
Addended by: Mena Goes on: 08/26/2013 04:27 PM   Modules accepted: Orders

## 2013-08-26 NOTE — Progress Notes (Signed)
Subjective:     Patient ID: [name] [name], male   DOB: 05-05-1940, 73 y.o.   MRN: [id number]  HPI this 73 year old male was referred for evaluation of possible mesenteric ischemia. This patient has been having circumferential abdominal discomfort for the last several months which occurs at various times. There is no nausea vomiting or weight loss. His appetite has been good. Sometimes the pain is postprandial and sometimes it occurs at night. He had recent evaluation with CT angiogram of the abdomen and pelvis which suggested narrowing of his celiac axis and SMA he was referred for evaluation. He denies claudication symptoms. He does have a history of coronary artery bypass grafting several years ago in [place].  Past Medical History  Diagnosis Date  . Hypertension   . Pancreatitis Dec 2011    ERCP ok.  . Hiatal hernia   . Chronic chest pain   . Left bundle branch block   . GERD (gastroesophageal reflux disease)   . Hyperlipidemia   . Statin intolerance   . Esophageal stricture   . Renal cyst     Seen on CT 08/2011 also with circumferential bladder wall thickening  . Anginal pain   . Anemia 10/06/2011  . Colon polyp     adenomatous  . Hemorrhoids   . Diverticulosis   . Atrial fibrillation   . Coronary artery disease     a. S/p CABG in 2001. b. S/p DES to protected LM and BMS to RCA 2006. c. 12/2009: s/p DES to Cx;  d. 09/2011 Cath: patent stents, patent grafts -->Med Rx.;  e. CP with abnl Nuc => LHC 10/04/12: oLM 30%, dLM stent into the CFX patent, LAD occluded, LIMA-LAD patent, RI 30%, mid AVCFX 30%, oOM1 50%, oRCA stent patent, mRCA 50-60%, Radial graft-Dx patent; EF 55%.=> Med Rx  . Carotid stenosis 04/10/11    a. s/p left carotid endarterectomy 04/14/2011.;  b.  Carotid US (11/13):  L CEA ok; RICA 1-39%  . Prostate cancer     s/p cryoablation  . Skin cancer     "cut it off my right ear"  . Chronic bronchitis     "get it q yr"  . Daily headache     "for the last couple weeks"  (08/13/2013)  . Arthritis     "joints hurt; shoulders, arms, back" (08/13/2013)  . Chronic lower back pain     History  Substance Use Topics  . Smoking status: Former Smoker -- .1 years    Types: Cigarettes  . Smokeless tobacco: Current User    Types: Chew     Comment: "smoked a few cigarettes; no more than 1 month"  . Alcohol Use: Yes     Comment: "no alcohol since I was a teenager"    Family History  Problem Relation Age of Onset  . Heart disease Mother     Heart Disease before age 78  . Hypertension Mother   . Heart attack Mother   . Cancer Father   . Hypertension Sister   . Heart disease Sister     Heart Disease before age 26  . Cancer Sister   . Heart attack Sister   . Hypertension Brother   . Heart disease Brother     Heart Disease before age 23  . Heart attack Brother     Allergies  Allergen Reactions  . Amoxicillin-Pot Clavulanate     REACTION: 'Burns' Stomach  . Codeine Nausea And Vomiting  . Erythromycin Other (See Comments)  All mycins cause upset stomach  . Hydrocodone Nausea And Vomiting  . Morphine Nausea And Vomiting  . Nitrofuran Derivatives Other (See Comments)    unknown  . Oxycodone Hcl Nausea And Vomiting  . Shellfish Allergy Swelling  . Tramadol Nausea And Vomiting  . Zolpidem Tartrate Other (See Comments)    hallucinations    Current outpatient prescriptions:amLODipine (NORVASC) 10 MG tablet, Take 10 mg by mouth daily., Disp: , Rfl: ;  aspirin EC 81 MG tablet, Take 81 mg by mouth at bedtime. , Disp: , Rfl: ;  dicyclomine (BENTYL) 10 MG capsule, Take 1 capsule (10 mg total) by mouth 4 (four) times daily -  before meals and at bedtime., Disp: 120 capsule, Rfl: 1 diphenhydrAMINE (BENADRYL) 25 MG tablet, Take 25 mg by mouth every 8 (eight) hours as needed for allergies., Disp: , Rfl: ;  famotidine (PEPCID) 20 MG tablet, Take 1 tablet (20 mg total) by mouth 2 (two) times daily., Disp: 30 tablet, Rfl: 0;  isosorbide dinitrate (ISORDIL) 20 MG  tablet, Take 20 mg by mouth 2 (two) times daily., Disp: , Rfl:  metoCLOPramide (REGLAN) 10 MG tablet, Take 5 mg by mouth 4 (four) times daily as needed. Stomach pain, Disp: , Rfl: ;  metoprolol (LOPRESSOR) 50 MG tablet, Take 25 mg by mouth 2 (two) times daily. , Disp: , Rfl: ;  nitroGLYCERIN (NITROSTAT) 0.4 MG SL tablet, Place 1 tablet (0.4 mg total) under the tongue every 5 (five) minutes as needed. For chest pain, Disp: 25 tablet, Rfl: 3 pantoprazole (PROTONIX) 40 MG tablet, Take 40 mg by mouth 2 (two) times daily. , Disp: , Rfl:   BP 141/63  Pulse 73  Ht 5\' 4"  (1.626 m)  Wt 143 lb 8 oz (65.091 kg)  BMI 24.62 kg/m2  SpO2 100%  Body mass index is 24.62 kg/(m^2).           Review of Systems denies chest pain, dyspnea on exertion, PND, orthopnea, hemoptysis. All systems negative complete review of systems.    Objective:   Physical Exam BP 141/63  Pulse 73  Ht 5\' 4"  (1.626 m)  Wt 143 lb 8 oz (65.091 kg)  BMI 24.62 kg/m2  SpO2 100%  Gen.-alert and oriented x3 in no apparent distress HEENT normal for age Lungs no rhonchi or wheezing Cardiovascular regular rhythm no murmurs carotid pulses 3+ palpable no bruits audible Abdomen soft nontender no palpable masses Musculoskeletal free of  major deformities Skin clear -no rashes Neurologic normal Lower extremities 3+ femoral and dorsalis pedis pulses palpable bilaterally with no edema  Today I reviewed the CT angiogram of the abdomen and pelvis which was performed last week. There is very mild narrowing at the origin of the celiac axis and SMA. There is some mild diffuse atherosclerotic changes in the infrarenal aorta.     Assessment:     Chronic abdominal discomfort which is atypical and not consistent with mesenteric ischemia. CT angiogram rules out significant stenosis and SMA and celiac axis   status post left carotid endarterectomy by me in the past-has not returned for consistent followup Plan:    return in 6 months with  carotid duplex exam and sooner if practitioner for followup left carotid endarterectomy done a few years ago-patient is asymptomatic Further followup and evaluation of abdominal discomfort per medical doctor

## 2013-09-03 ENCOUNTER — Encounter: Payer: Medicare Other | Admitting: Physician Assistant

## 2013-09-03 ENCOUNTER — Encounter: Payer: Self-pay | Admitting: Physician Assistant

## 2013-09-03 VITALS — Ht 64.0 in

## 2013-09-03 NOTE — Progress Notes (Signed)
This encounter was created in error - please disregard.

## 2013-09-03 NOTE — Progress Notes (Deleted)
Cardiology Office Note    Date:  09/03/2013   ID:  [name] [name], DOB Jun 22, 1940, MRN [id number]  PCP:  Leonides Sake, MD  Cardiologist:  Dr. Kirk Ruths      History of Present Illness: [name] [name] is a 73 y.o. male with a hx of CAD, remote CABG, prior stenting of the left main, RCA and LCX. Cath in December of 2011 demonstrated a 90% stenosis at the bifurcation of AV circumflex/OM and he had a DES to the LCX. His EF was normal. His other issues include carotid disease s/p L CEA, HTN, GERD, pancreatitis, prostate CA s/p cryoablation, LBBB and HL. He does not tolerate statins.  Last cath in 09/2012 with patent stents and patent grafts.  Med Rx continued.  He was admitted 7/22-7/24 with chest pain.  CEs were negative.  IP myoview was neg for ischemia.  He was seen by GI for abdominal pain.  Dicyclomine was recommended.  If he has persistent problems, Dr. Carlean Purl recommended considering angiogram vs functional mesenteric study with dopplers.  He was seen in [facility] by vascular surgery last week.  A recent abdominal CTA ruled out the possibility of significant SMA or celiac axis stenosis.    ***   Studies: - LHC (10/04/12): oLM 30%, dLM stent into the CFX patent, LAD occluded, LIMA-LAD patent, RI 30%, mid AVCFX 30%, oOM1 50%, oRCA stent patent, mRCA 50-60%, Radial graft-Dx patent; EF 55% >>> medical Rx  - Carotid US (11/13): L CEA ok; RICA 1-39%. He is followed by [facility] (Dr. Kellie Simmering).   Recent Labs/Images: 10/03/2012: HDL Cholesterol by NMR 30*; LDL (calc) 150*; TSH 3.462  08/12/2013: ALT 20  08/13/2013: Pro B Natriuretic peptide (BNP) 132.4*  08/14/2013: Creatinine 1.26; Hemoglobin 12.5*; Potassium 4.0   Dg Chest 2 View  08/13/2013     IMPRESSION: Low inspiratory volumes with mild bibasilar atelectasis.   Electronically Signed   By: Jacqulynn Cadet M.D.   On: 08/13/2013 15:22    Nm Myocar Multi W/spect W/wall Motion / Ef   08/14/2013   IMPRESSION: 1. Similar appearance to prior with  septal hypokinesis and suspected scar along the cardiac apex anteroseptal wall. No inducible ischemia. Ejection fraction stable at 60%.   Electronically Signed   By: Sherryl Barters M.D.   On: 08/14/2013 16:53     Wt Readings from Last 3 Encounters:  08/26/13 143 lb 8 oz (65.091 kg)  08/15/13 140 lb 9.6 oz (63.776 kg)  08/12/13 143 lb 5 oz (65.006 kg)     Past Medical History  Diagnosis Date  . Hypertension   . Pancreatitis Dec 2011    ERCP ok.  . Hiatal hernia   . Chronic chest pain   . Left bundle branch block   . GERD (gastroesophageal reflux disease)   . Hyperlipidemia   . Statin intolerance   . Esophageal stricture   . Renal cyst     Seen on CT 08/2011 also with circumferential bladder wall thickening  . Anginal pain   . Anemia 10/06/2011  . Colon polyp     adenomatous  . Hemorrhoids   . Diverticulosis   . Atrial fibrillation   . Coronary artery disease     a. S/p CABG in 2001. b. S/p DES to protected LM and BMS to RCA 2006. c. 12/2009: s/p DES to Cx;  d. 09/2011 Cath: patent stents, patent grafts -->Med Rx.;  e. CP with abnl Nuc => LHC 10/04/12: oLM 30%, dLM stent into the CFX  patent, LAD occluded, LIMA-LAD patent, RI 30%, mid AVCFX 30%, oOM1 50%, oRCA stent patent, mRCA 50-60%, Radial graft-Dx patent; EF 55%.=> Med Rx  . Carotid stenosis 04/10/11    a. s/p left carotid endarterectomy 04/14/2011.;  b.  Carotid US (11/13):  L CEA ok; RICA 1-39%  . Prostate cancer     s/p cryoablation  . Skin cancer     "cut it off my right ear"  . Chronic bronchitis     "get it q yr"  . Daily headache     "for the last couple weeks" (08/13/2013)  . Arthritis     "joints hurt; shoulders, arms, back" (08/13/2013)  . Chronic lower back pain     Current Outpatient Prescriptions  Medication Sig Dispense Refill  . amLODipine (NORVASC) 10 MG tablet Take 10 mg by mouth daily.      Marland Kitchen aspirin EC 81 MG tablet Take 81 mg by mouth at bedtime.       . dicyclomine (BENTYL) 10 MG capsule Take 1  capsule (10 mg total) by mouth 4 (four) times daily -  before meals and at bedtime.  120 capsule  1  . diphenhydrAMINE (BENADRYL) 25 MG tablet Take 25 mg by mouth every 8 (eight) hours as needed for allergies.      . famotidine (PEPCID) 20 MG tablet Take 1 tablet (20 mg total) by mouth 2 (two) times daily.  30 tablet  0  . isosorbide dinitrate (ISORDIL) 20 MG tablet Take 20 mg by mouth 2 (two) times daily.      . metoCLOPramide (REGLAN) 10 MG tablet Take 5 mg by mouth 4 (four) times daily as needed. Stomach pain      . metoprolol (LOPRESSOR) 50 MG tablet Take 25 mg by mouth 2 (two) times daily.       . nitroGLYCERIN (NITROSTAT) 0.4 MG SL tablet Place 1 tablet (0.4 mg total) under the tongue every 5 (five) minutes as needed. For chest pain  25 tablet  3  . pantoprazole (PROTONIX) 40 MG tablet Take 40 mg by mouth 2 (two) times daily.        No current facility-administered medications for this visit.     Allergies:   Amoxicillin-pot clavulanate; Codeine; Erythromycin; Hydrocodone; Morphine; Nitrofuran derivatives; Oxycodone hcl; Shellfish allergy; Tramadol; and Zolpidem tartrate   Social History:  The patient  reports that he has quit smoking. His smoking use included Cigarettes. He smoked 0.00 packs per day for .1 years. His smokeless tobacco use includes Chew. He reports that he drinks alcohol. He reports that he does not use illicit drugs.   Family History:  The patient's family history includes Cancer in his father and sister; Heart attack in his brother, mother, and sister; Heart disease in his brother, mother, and sister; Hypertension in his brother, mother, and sister.   ROS:  Please see the history of present illness.   ***   All other systems reviewed and negative.   PHYSICAL EXAM: VS:  Ht 5\' 4"  (1.626 m) Well nourished, well developed, in no acute distress HEENT: normal Neck: ***no JVD Cardiac:  normal S1, S2; ***RRR; no murmur Lungs:  ***clear to auscultation bilaterally, no  wheezing, rhonchi or rales Abd: soft, nontender, no hepatomegaly Ext: ***no edema Skin: warm and dry Neuro:  CNs 2-12 intact, no focal abnormalities noted  EKG:  ***     ASSESSMENT AND PLAN:  Coronary artery disease involving native coronary artery of native heart without angina pectoris  HYPERTENSION, BENIGN  Carotid stenosis, bilateral  Gastroesophageal reflux disease, esophagitis presence not specified  HYPERCHOLESTEROLEMIA, PURE  Abdominal pain, chronic, epigastric    Disposition:  ***   Signed, Versie Starks, MHS 09/03/2013 2:15 PM    [facility] [address], [place], [place]  [address] Phone: [phone]; Fax: [phone]

## 2013-09-15 IMAGING — CR DG CHEST 2V
2 series · 2 of 2 positions shown · non-contrast
Comparison: 01/07/2010.

CLINICAL DATA: Chest pain.

CHEST - 2 VIEW

[w chest pa]
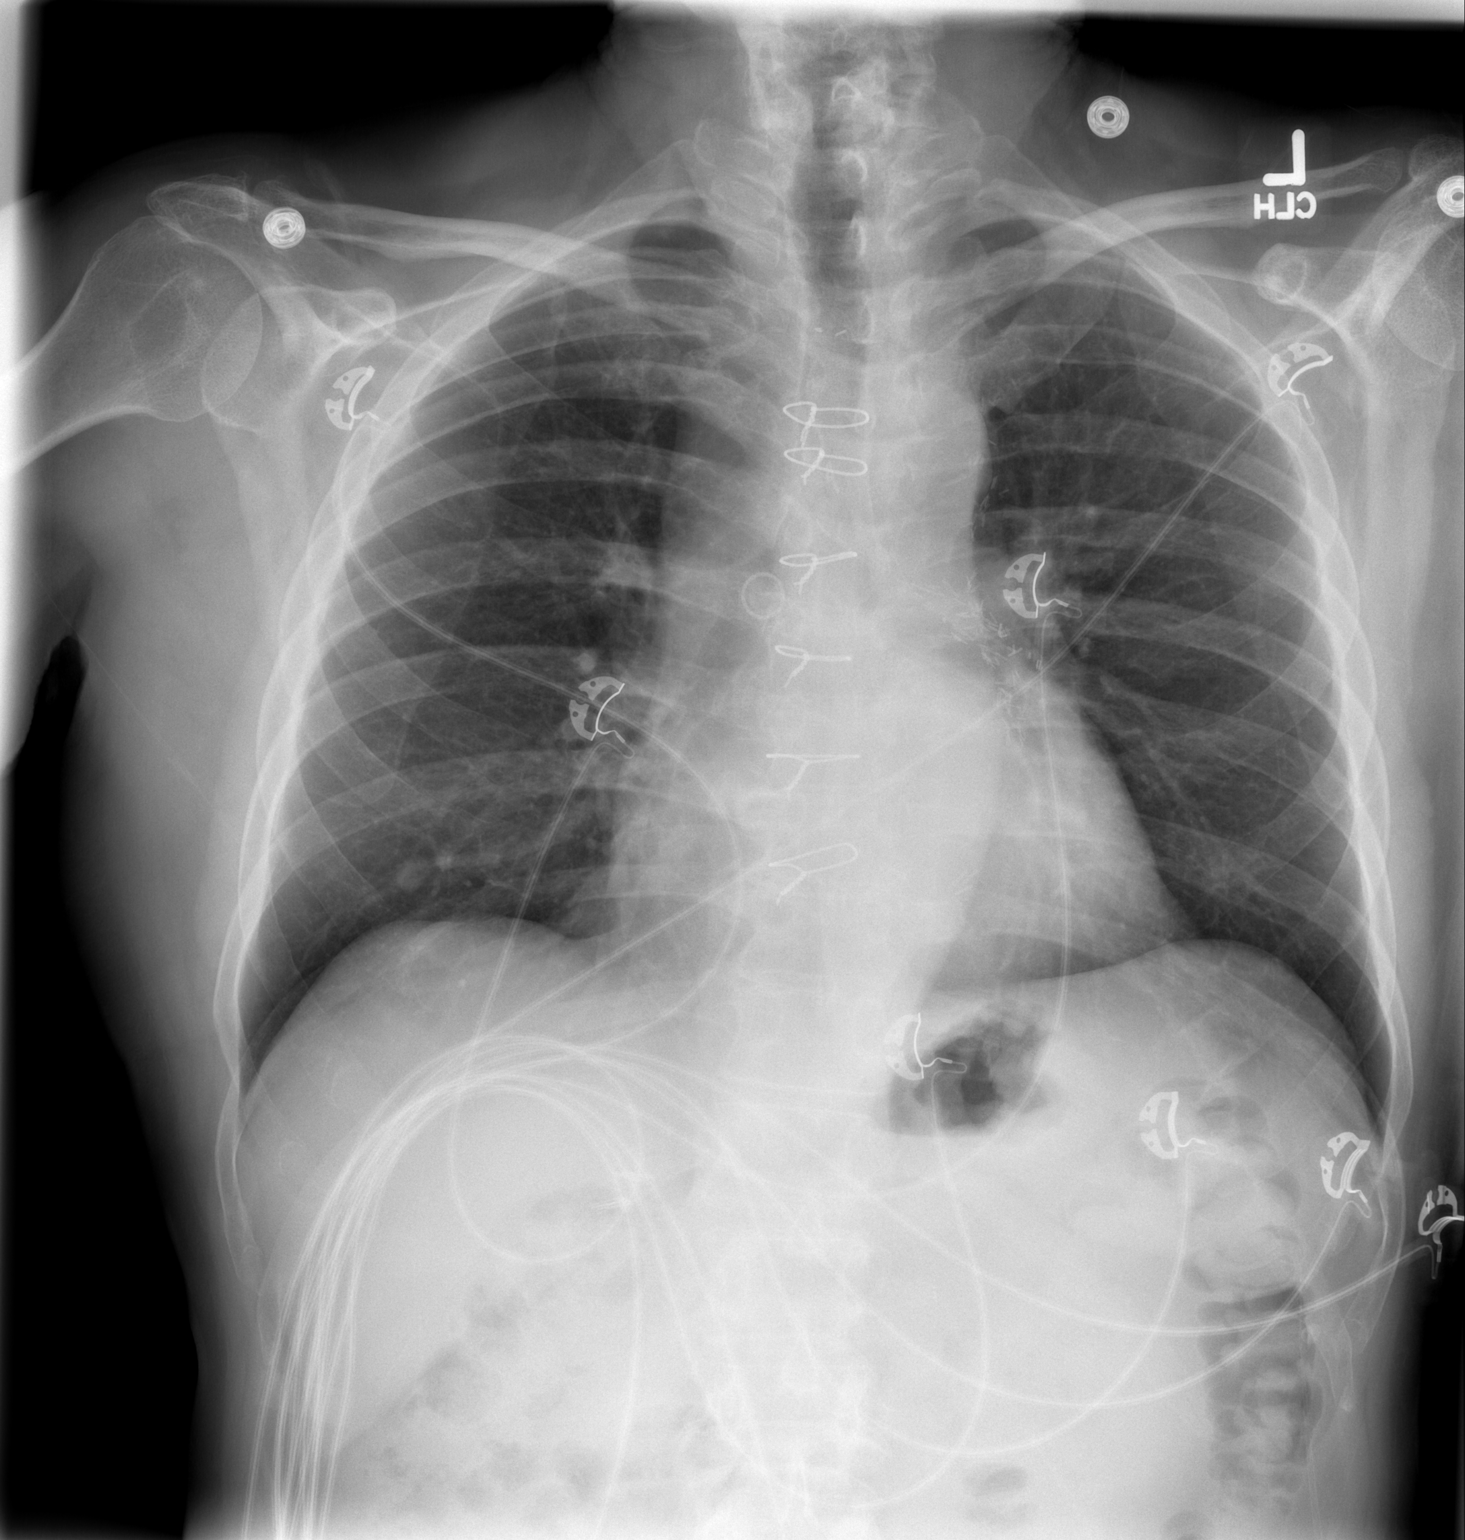

[w chest lat]
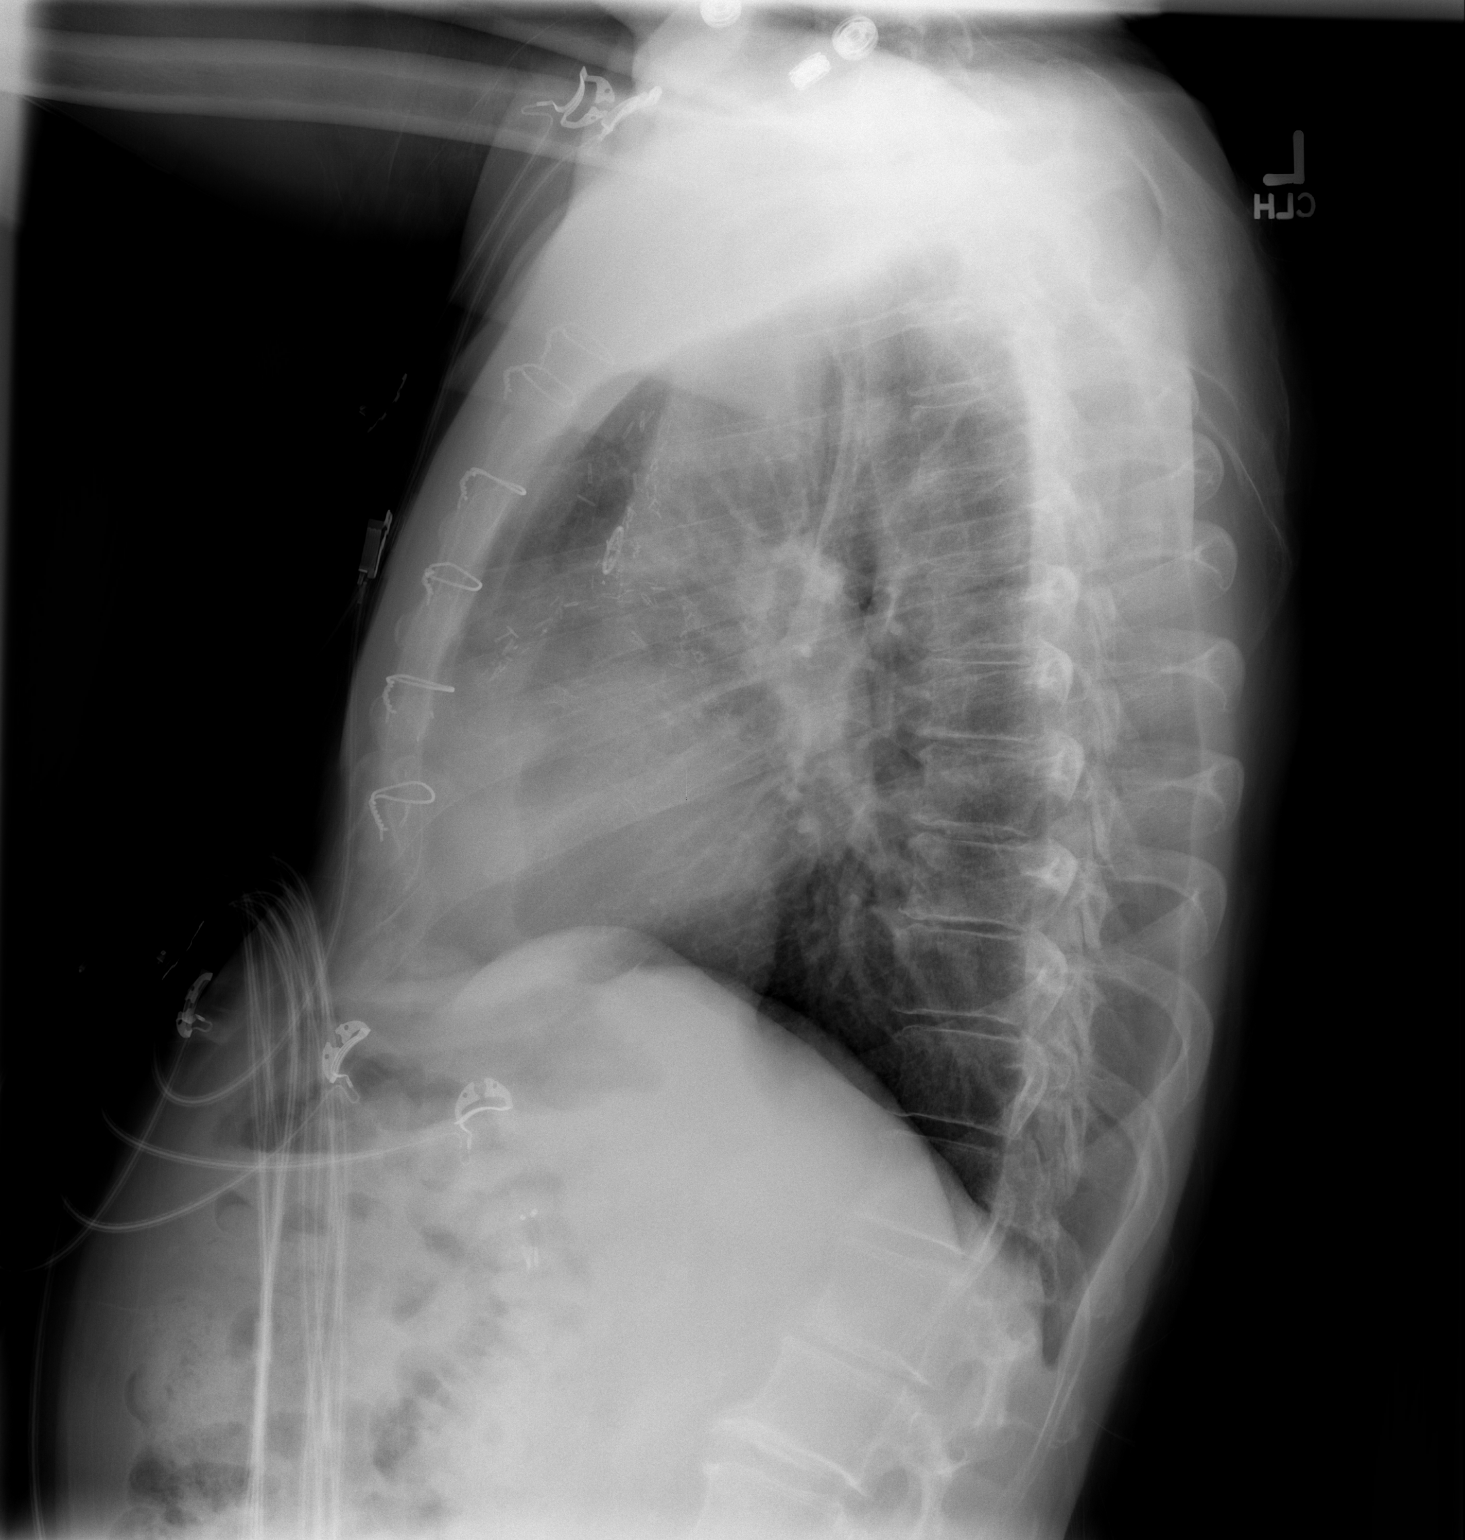

[2 of 2 positions shown; findings below may reference images not displayed]

FINDINGS: The cardiac silhouette, mediastinal and hilar contours
are within normal limits and stable.  Stable surgical changes from
bypass surgery.  No acute pulmonary findings.  No pleural effusion
or pneumothorax.  There is a rounded density at the right lung
base.  I do not see this on the prior examination but it could
represent a nipple shadow.  Recommend a repeat PA chest film with
nipple markers.  The bony thorax is intact.
IMPRESSION: 1.  No acute cardiopulmonary findings.
2.  Rounded density at the right lung base.  Pulmonary nodule
versus nipple shadow.  Recommend repeat PA chest film with nipple
markers.

## 2013-09-15 IMAGING — CT CT ABD-PELV W/O CM
2 of 4 series · 17 of 46 positions shown, 19 images · non-contrast
Comparison: 01/04/2010

CLINICAL DATA: Right lower quadrant abdominal pain,
nausea/vomiting, status post cholecystectomy, status post
prostatectomy for prostate cancer

CT ABDOMEN AND PELVIS WITHOUT CONTRAST
TECHNIQUE: Multidetector CT imaging of the abdomen and pelvis was
performed following the standard protocol without intravenous
contrast.

[Series 2: abd/pelv w/o 5.0 b31f st · axial · non-contrast · 0.69mm/px · z∈[-469,-64]mm · 14 of 89 slices shown, 16 images]
[im 4/89  soft-tissue]
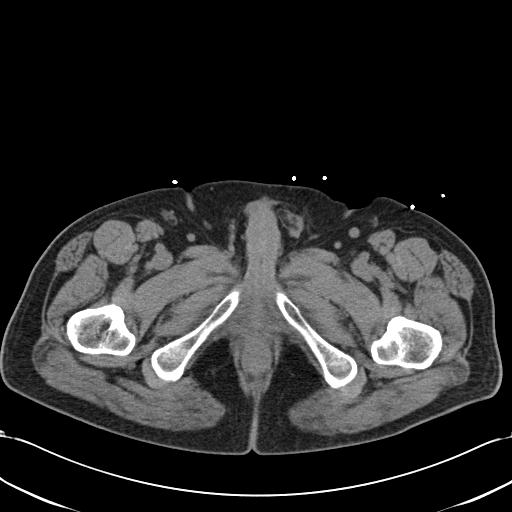
[im 4/89  bone]
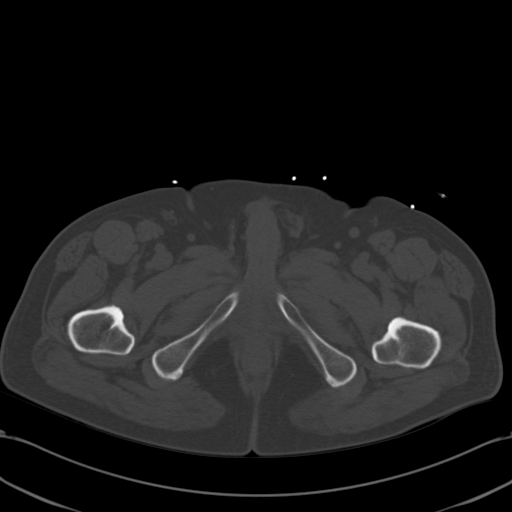
[im 12/89  soft-tissue]
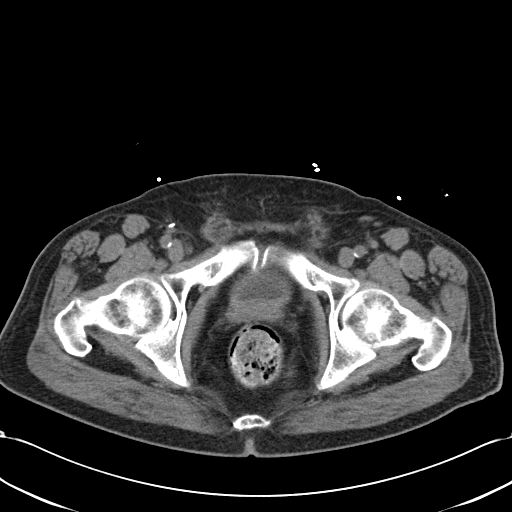
[im 19/89  soft-tissue]
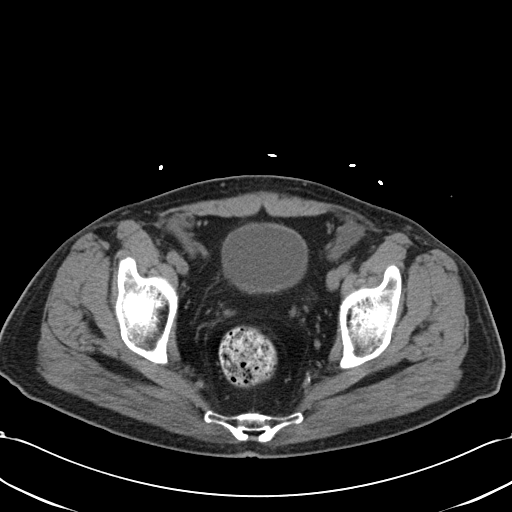
[im 23/89  soft-tissue]
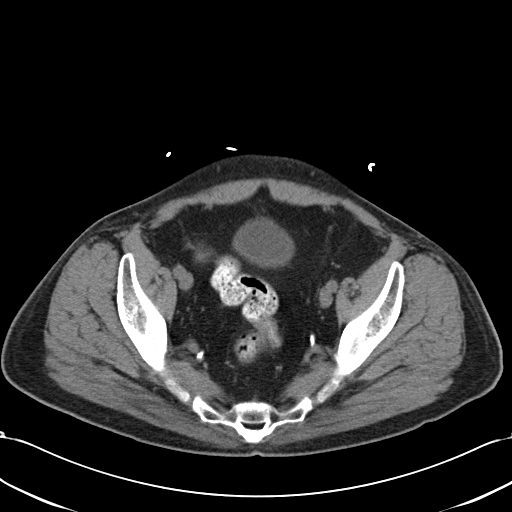
[im 30/89  soft-tissue]
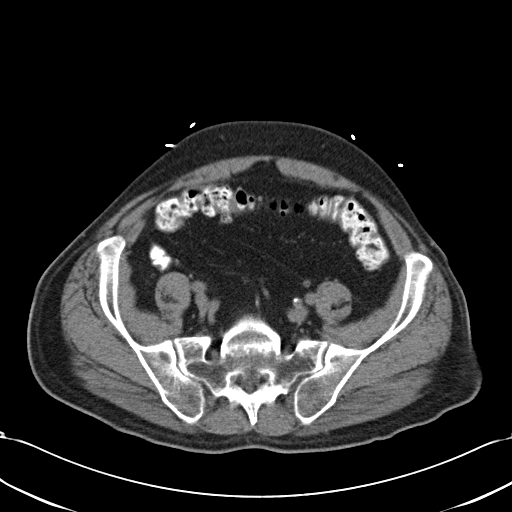
[im 37/89  soft-tissue]
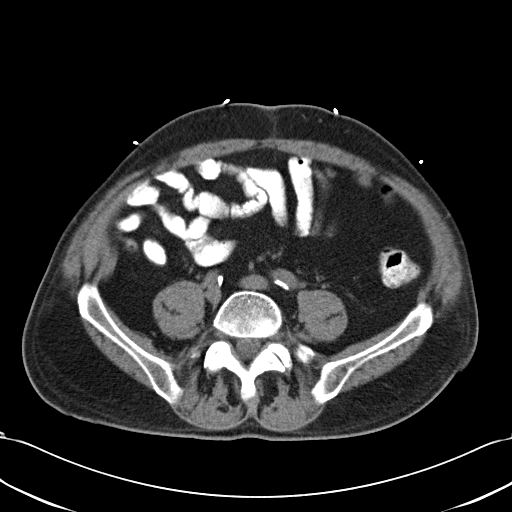
[im 41/89  soft-tissue]
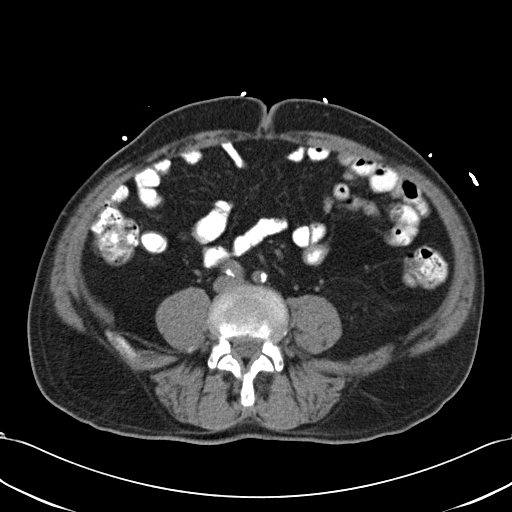
[im 48/89  soft-tissue]
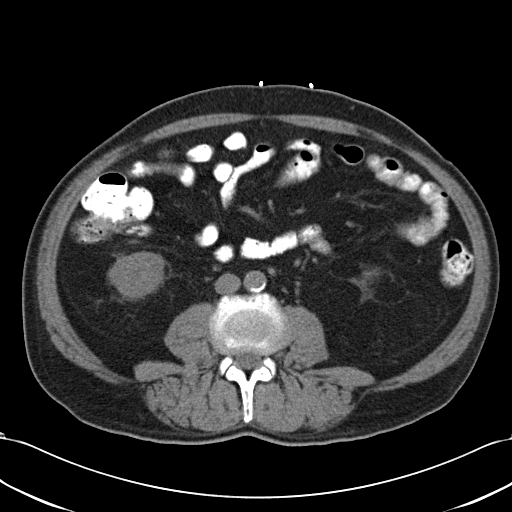
[im 52/89  soft-tissue]
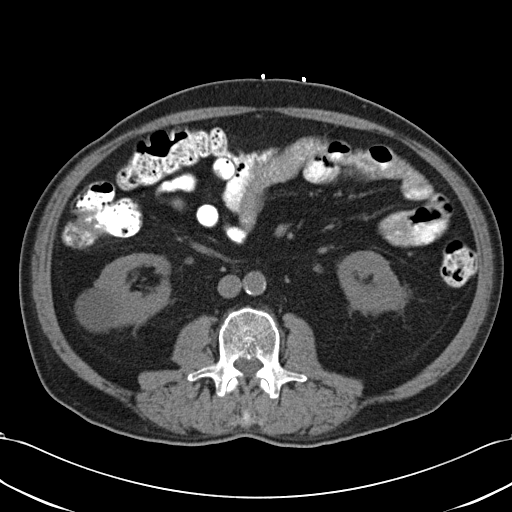
[im 52/89  bone]
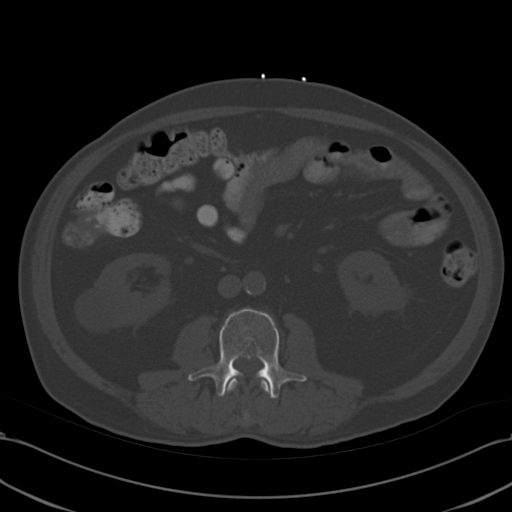
[im 59/89  soft-tissue]
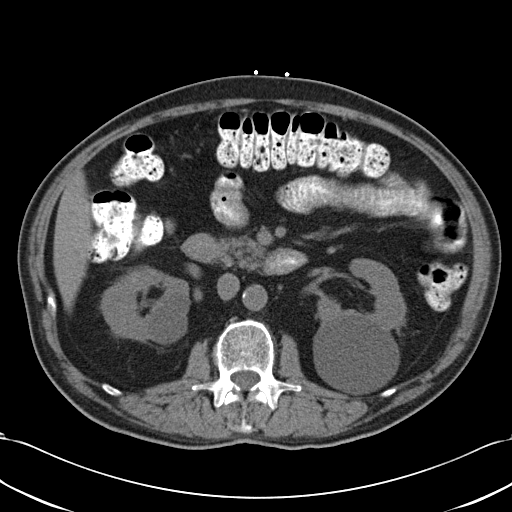
[im 67/89  soft-tissue]
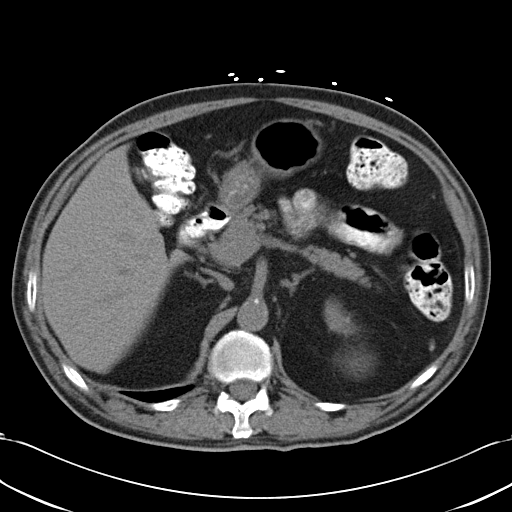
[im 70/89  soft-tissue]
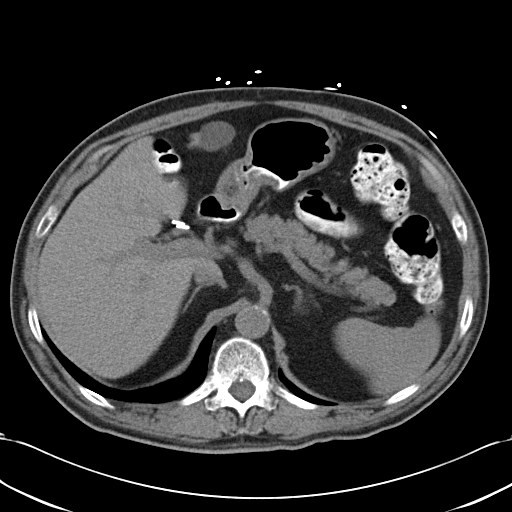
[im 78/89  soft-tissue]
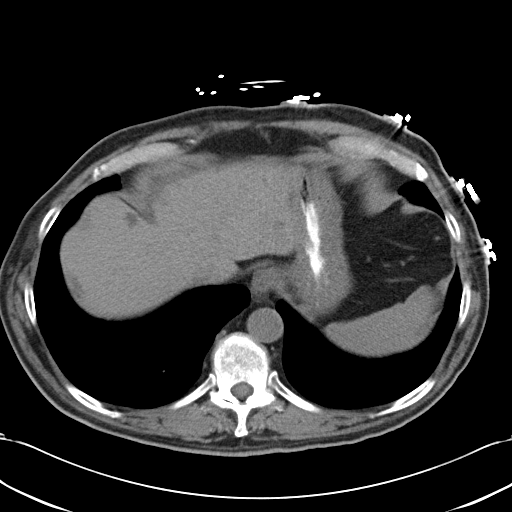
[im 85/89  soft-tissue]
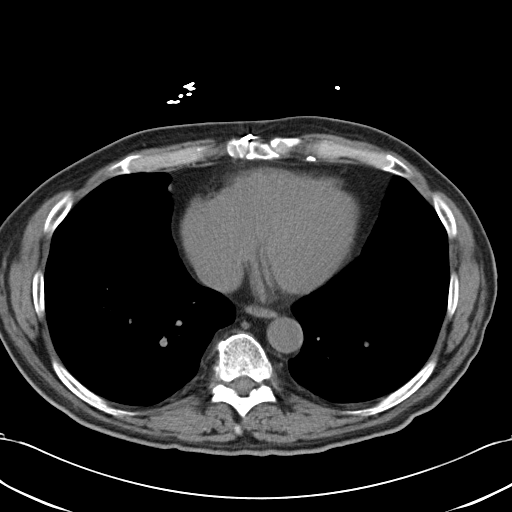

[Series 602: cor · coronal · 0.87mm/px · 3 of 69 slices shown]
[im 23/69  soft-tissue]
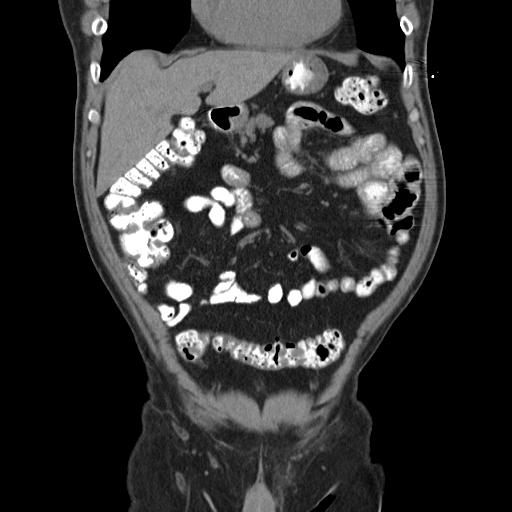
[im 31/69  soft-tissue]
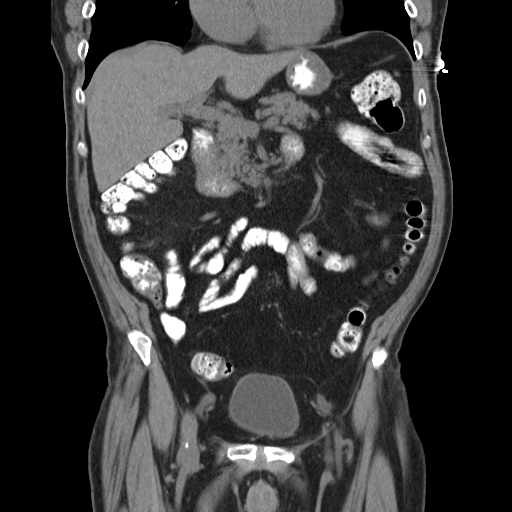
[im 38/69  soft-tissue]
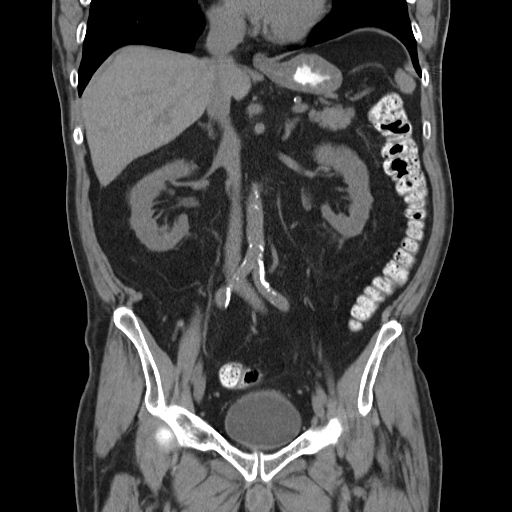

[17 of 46 positions shown; findings below may reference images not displayed]

FINDINGS: Lung bases are essentially clear.

Stable hepatic cysts, the largest measuring 2.2 x 2.6 cm in the
left hepatic lobe.

Unenhanced spleen, pancreas, and adrenal glands within normal
limits.

Status post cholecystectomy.  No intrahepatic or extrahepatic
ductal dilatation.

Bilateral renal cysts, the largest measuring 5.9 x 5.9 cm in the
left upper pole and 3.8 x 4.2 cm in the right interpolar kidney.
Punctate nonobstructing left renal calculus (series 2/image 29).
No hydronephrosis.

No evidence of bowel obstruction.  Normal appendix.  No colonic
wall thickening or inflammatory changes.

Atherosclerotic calcifications of the abdominal aorta and branch
vessels.

No abdominopelvic ascites.

No suspicious abdominopelvic lymphadenopathy.

Status post prostatectomy.

No ureteral or bladder calculi.

Degenerative changes of the visualized thoracolumbar spine.  No
suspicious osseous lesions.
IMPRESSION: Normal appendix.  No evidence of bowel obstruction.

Punctate nonobstructing left renal calculus, unchanged.  No
ureteral or bladder calculi.  No hydronephrosis.

No CT findings to account for the patient's abdominal pain.

Status post prostatectomy.  No findings to suggest metastatic
disease in the abdomen/pelvis.

## 2013-09-20 ENCOUNTER — Encounter: Payer: Self-pay | Admitting: Internal Medicine

## 2013-10-06 DIAGNOSIS — D485 Neoplasm of uncertain behavior of skin: Secondary | ICD-10-CM | POA: Diagnosis not present

## 2013-10-06 DIAGNOSIS — K130 Diseases of lips: Secondary | ICD-10-CM | POA: Diagnosis not present

## 2013-10-06 DIAGNOSIS — IMO0002 Reserved for concepts with insufficient information to code with codable children: Secondary | ICD-10-CM | POA: Diagnosis not present

## 2013-11-05 ENCOUNTER — Encounter: Payer: Self-pay | Admitting: Internal Medicine

## 2014-01-01 ENCOUNTER — Encounter (HOSPITAL_COMMUNITY): Payer: Self-pay | Admitting: Cardiology

## 2014-01-09 DIAGNOSIS — Z23 Encounter for immunization: Secondary | ICD-10-CM | POA: Diagnosis not present

## 2014-02-02 DIAGNOSIS — M79604 Pain in right leg: Secondary | ICD-10-CM | POA: Diagnosis not present

## 2014-02-04 ENCOUNTER — Telehealth: Payer: Self-pay | Admitting: *Deleted

## 2014-02-04 NOTE — Telephone Encounter (Signed)
Opened in error

## 2014-02-05 ENCOUNTER — Telehealth: Payer: Self-pay | Admitting: *Deleted

## 2014-02-05 ENCOUNTER — Ambulatory Visit (AMBULATORY_SURGERY_CENTER): Payer: Self-pay | Admitting: *Deleted

## 2014-02-05 VITALS — Ht 64.0 in | Wt 141.0 lb

## 2014-02-05 DIAGNOSIS — Z86010 Personal history of colonic polyps: Secondary | ICD-10-CM

## 2014-02-05 MED ORDER — MOVIPREP 100 G PO SOLR: 1.0000 | Freq: Once | ORAL | Status: DC

## 2014-02-05 NOTE — Telephone Encounter (Signed)
Dr Henrene Pastor,  I saw Mr Corkery today in pre visit. He has a colon scheduled with you on 02-17-14, Tuesday for a history of colon polyps. He and his wife verbalized concern of him having long tern LLQ abdominal pain, with bloating, having stools that start normal and end with diarrhea and he has severe diaphoresis with some episodes of this, he has no vomiting or hematemesis but occasionally has bleeding after a stool that he believes is from hemorrhoids. He has been to the ED multiple times since summer here in [place] and also [facility] and also been to urgent care with no resolution of this issue. He takes pepcid as needed, reglan as needed and Pantoprazole but gets no relief. He also asked about having an endoscopy with his colon as he is having some dysphagia with solids and liquids . He states he is stable from a cardiac standpoint.   Do you want to proceed as scheduled with his colon , add an egd or would you want an OV with him due to all his issues? Please advise..thanks for your time.  Lelan Pons PV

## 2014-02-05 NOTE — Progress Notes (Signed)
Besides his stomach issues, pt states he feels fine. ewm No diet pills. ewm No home 02 use, ewm No issues with past sedation. ewm No egg or soy allergy. ewm No e mail for emmi video. ewm

## 2014-02-06 NOTE — Telephone Encounter (Signed)
Lanelle Bal made aware and ok'd this added on EGD.  [place] on AM 1032 am 02-06-2014. Lelan Pons PV

## 2014-02-06 NOTE — Telephone Encounter (Signed)
Wife returned call and was informed that Dr Henrene Pastor ok's adding on EGD. She was very thankful and appreciative that this was done. Verbalized understanding of this. [name] [name] PV

## 2014-02-06 NOTE — Telephone Encounter (Signed)
You can add on an EGD. He is my last case that day. We should still be able to finish on time based on my schedule. However, Lanelle Bal know. Thanks

## 2014-02-08 ENCOUNTER — Emergency Department (HOSPITAL_COMMUNITY): Payer: Medicare Other

## 2014-02-08 ENCOUNTER — Emergency Department (HOSPITAL_COMMUNITY)
Admission: EM | Admit: 2014-02-08 | Discharge: 2014-02-08 | Disposition: A | Discharge status: Home / Self Care | Payer: Medicare Other | Attending: Emergency Medicine | Admitting: Emergency Medicine

## 2014-02-08 ENCOUNTER — Encounter (HOSPITAL_COMMUNITY): Payer: Self-pay | Admitting: Family Medicine

## 2014-02-08 DIAGNOSIS — Z951 Presence of aortocoronary bypass graft: Secondary | ICD-10-CM | POA: Insufficient documentation

## 2014-02-08 DIAGNOSIS — I25119 Atherosclerotic heart disease of native coronary artery with unspecified angina pectoris: Secondary | ICD-10-CM | POA: Insufficient documentation

## 2014-02-08 DIAGNOSIS — Z8639 Personal history of other endocrine, nutritional and metabolic disease: Secondary | ICD-10-CM | POA: Diagnosis not present

## 2014-02-08 DIAGNOSIS — Z87891 Personal history of nicotine dependence: Secondary | ICD-10-CM | POA: Diagnosis not present

## 2014-02-08 DIAGNOSIS — R079 Chest pain, unspecified: Secondary | ICD-10-CM | POA: Diagnosis present

## 2014-02-08 DIAGNOSIS — Z85828 Personal history of other malignant neoplasm of skin: Secondary | ICD-10-CM | POA: Insufficient documentation

## 2014-02-08 DIAGNOSIS — Z86010 Personal history of colonic polyps: Secondary | ICD-10-CM | POA: Diagnosis not present

## 2014-02-08 DIAGNOSIS — R109 Unspecified abdominal pain: Secondary | ICD-10-CM

## 2014-02-08 DIAGNOSIS — G8929 Other chronic pain: Secondary | ICD-10-CM | POA: Diagnosis not present

## 2014-02-08 DIAGNOSIS — I4891 Unspecified atrial fibrillation: Secondary | ICD-10-CM | POA: Insufficient documentation

## 2014-02-08 DIAGNOSIS — Z87448 Personal history of other diseases of urinary system: Secondary | ICD-10-CM | POA: Diagnosis not present

## 2014-02-08 DIAGNOSIS — K219 Gastro-esophageal reflux disease without esophagitis: Secondary | ICD-10-CM | POA: Insufficient documentation

## 2014-02-08 DIAGNOSIS — Z955 Presence of coronary angioplasty implant and graft: Secondary | ICD-10-CM | POA: Insufficient documentation

## 2014-02-08 DIAGNOSIS — Z79899 Other long term (current) drug therapy: Secondary | ICD-10-CM | POA: Insufficient documentation

## 2014-02-08 DIAGNOSIS — Z8709 Personal history of other diseases of the respiratory system: Secondary | ICD-10-CM | POA: Diagnosis not present

## 2014-02-08 DIAGNOSIS — Z7982 Long term (current) use of aspirin: Secondary | ICD-10-CM | POA: Diagnosis not present

## 2014-02-08 DIAGNOSIS — R110 Nausea: Secondary | ICD-10-CM | POA: Diagnosis not present

## 2014-02-08 DIAGNOSIS — Z880 Allergy status to penicillin: Secondary | ICD-10-CM | POA: Diagnosis not present

## 2014-02-08 DIAGNOSIS — N281 Cyst of kidney, acquired: Secondary | ICD-10-CM | POA: Diagnosis not present

## 2014-02-08 DIAGNOSIS — I10 Essential (primary) hypertension: Secondary | ICD-10-CM | POA: Diagnosis not present

## 2014-02-08 DIAGNOSIS — R42 Dizziness and giddiness: Secondary | ICD-10-CM | POA: Diagnosis not present

## 2014-02-08 DIAGNOSIS — M1990 Unspecified osteoarthritis, unspecified site: Secondary | ICD-10-CM | POA: Insufficient documentation

## 2014-02-08 DIAGNOSIS — K7689 Other specified diseases of liver: Secondary | ICD-10-CM | POA: Diagnosis not present

## 2014-02-08 DIAGNOSIS — Z862 Personal history of diseases of the blood and blood-forming organs and certain disorders involving the immune mechanism: Secondary | ICD-10-CM | POA: Diagnosis not present

## 2014-02-08 DIAGNOSIS — R1013 Epigastric pain: Secondary | ICD-10-CM | POA: Insufficient documentation

## 2014-02-08 DIAGNOSIS — R0789 Other chest pain: Secondary | ICD-10-CM | POA: Insufficient documentation

## 2014-02-08 DIAGNOSIS — R0602 Shortness of breath: Secondary | ICD-10-CM | POA: Diagnosis not present

## 2014-02-08 DIAGNOSIS — Z8546 Personal history of malignant neoplasm of prostate: Secondary | ICD-10-CM | POA: Diagnosis not present

## 2014-02-08 LAB — URINALYSIS, ROUTINE W REFLEX MICROSCOPIC
BILIRUBIN URINE: NEGATIVE
Glucose, UA: NEGATIVE mg/dL
Hgb urine dipstick: NEGATIVE
Ketones, ur: NEGATIVE mg/dL
Leukocytes, UA: NEGATIVE
NITRITE: NEGATIVE
Protein, ur: NEGATIVE mg/dL
SPECIFIC GRAVITY, URINE: 1.013 (ref 1.005–1.030)
Urobilinogen, UA: 0.2 mg/dL (ref 0.0–1.0)
pH: 7 (ref 5.0–8.0)

## 2014-02-08 LAB — CBC
HCT: 39.8 % (ref 39.0–52.0)
Hemoglobin: 13.9 g/dL (ref 13.0–17.0)
MCH: 29.5 pg (ref 26.0–34.0)
MCHC: 34.9 g/dL (ref 30.0–36.0)
MCV: 84.5 fL (ref 78.0–100.0)
PLATELETS: 184 10*3/uL (ref 150–400)
RBC: 4.71 MIL/uL (ref 4.22–5.81)
RDW: 13.6 % (ref 11.5–15.5)
WBC: 7.3 10*3/uL (ref 4.0–10.5)

## 2014-02-08 LAB — BASIC METABOLIC PANEL
Anion gap: 8 (ref 5–15)
BUN: 15 mg/dL (ref 6–23)
CO2: 28 mmol/L (ref 19–32)
Calcium: 8.6 mg/dL (ref 8.4–10.5)
Chloride: 104 mEq/L (ref 96–112)
Creatinine, Ser: 1.13 mg/dL (ref 0.50–1.35)
GFR, EST AFRICAN AMERICAN: 73 mL/min — AB (ref >90)
GFR, EST NON AFRICAN AMERICAN: 63 mL/min — AB (ref >90)
Glucose, Bld: 101 mg/dL — ABNORMAL HIGH (ref 70–99)
POTASSIUM: 4.1 mmol/L (ref 3.5–5.1)
SODIUM: 140 mmol/L (ref 135–145)

## 2014-02-08 LAB — I-STAT TROPONIN, ED: TROPONIN I, POC: 0.01 ng/mL (ref 0.00–0.08)

## 2014-02-08 LAB — BRAIN NATRIURETIC PEPTIDE: B Natriuretic Peptide: 103.3 pg/mL — ABNORMAL HIGH (ref 0.0–100.0)

## 2014-02-08 MED ORDER — SODIUM CHLORIDE 0.9 % IV BOLUS (SEPSIS)
250.0000 mL | Freq: Once | INTRAVENOUS | Status: AC
  Administered 2014-02-08: 250 mL via INTRAVENOUS

## 2014-02-08 MED ORDER — IOHEXOL 300 MG/ML  SOLN
100.0000 mL | Freq: Once | INTRAMUSCULAR | Status: AC | PRN
  Administered 2014-02-08: 100 mL via INTRAVENOUS

## 2014-02-08 MED ORDER — LORAZEPAM 0.5 MG PO TABS: ORAL_TABLET | ORAL | Status: DC

## 2014-02-08 NOTE — ED Notes (Signed)
Pt here for chest pain that stared last night. sts he took nitro and had relief. sts some nausea and SOB last night and now dizziness. sts last week face and eyes felt funny.

## 2014-02-08 NOTE — Discharge Instructions (Signed)
Chest Pain (Nonspecific) °It is often hard to give a specific diagnosis for the cause of chest pain. There is always a chance that your pain could be related to something serious, such as a heart attack or a blood clot in the lungs. You need to follow up with your health care provider for further evaluation. °CAUSES  °· Heartburn. °· Pneumonia or bronchitis. °· Anxiety or stress. °· Inflammation around your heart (pericarditis) or lung (pleuritis or pleurisy). °· A blood clot in the lung. °· A collapsed lung (pneumothorax). It can develop suddenly on its own (spontaneous pneumothorax) or from trauma to the chest. °· Shingles infection (herpes zoster virus). °The chest wall is composed of bones, muscles, and cartilage. Any of these can be the source of the pain. °· The bones can be bruised by injury. °· The muscles or cartilage can be strained by coughing or overwork. °· The cartilage can be affected by inflammation and become sore (costochondritis). °DIAGNOSIS  °Lab tests or other studies may be needed to find the cause of your pain. Your health care provider may have you take a test called an ambulatory electrocardiogram (ECG). An ECG records your heartbeat patterns over a 24-hour period. You may also have other tests, such as: °· Transthoracic echocardiogram (TTE). During echocardiography, sound waves are used to evaluate how blood flows through your heart. °· Transesophageal echocardiogram (TEE). °· Cardiac monitoring. This allows your health care provider to monitor your heart rate and rhythm in real time. °· Holter monitor. This is a portable device that records your heartbeat and can help diagnose heart arrhythmias. It allows your health care provider to track your heart activity for several days, if needed. °· Stress tests by exercise or by giving medicine that makes the heart beat faster. °TREATMENT  °· Treatment depends on what may be causing your chest pain. Treatment may include: °· Acid blockers for  heartburn. °· Anti-inflammatory medicine. °· Pain medicine for inflammatory conditions. °· Antibiotics if an infection is present. °· You may be advised to change lifestyle habits. This includes stopping smoking and avoiding alcohol, caffeine, and chocolate. °· You may be advised to keep your head raised (elevated) when sleeping. This reduces the chance of acid going backward from your stomach into your esophagus. °Most of the time, nonspecific chest pain will improve within 2-3 days with rest and mild pain medicine.  °HOME CARE INSTRUCTIONS  °· If antibiotics were prescribed, take them as directed. Finish them even if you start to feel better. °· For the next few days, avoid physical activities that bring on chest pain. Continue physical activities as directed. °· Do not use any tobacco products, including cigarettes, chewing tobacco, or electronic cigarettes. °· Avoid drinking alcohol. °· Only take medicine as directed by your health care provider. °· Follow your health care provider's suggestions for further testing if your chest pain does not go away. °· Keep any follow-up appointments you made. If you do not go to an appointment, you could develop lasting (chronic) problems with pain. If there is any problem keeping an appointment, call to reschedule. °SEEK MEDICAL CARE IF:  °· Your chest pain does not go away, even after treatment. °· You have a rash with blisters on your chest. °· You have a fever. °SEEK IMMEDIATE MEDICAL CARE IF:  °· You have increased chest pain or pain that spreads to your arm, neck, jaw, back, or abdomen. °· You have shortness of breath. °· You have an increasing cough, or you cough   up blood.  You have severe back or abdominal pain.  You feel nauseous or vomit.  You have severe weakness.  You faint.  You have chills. This is an emergency. Do not wait to see if the pain will go away. Get medical help at once. Call your local emergency services (911 in [place] Do not drive  yourself to the hospital. MAKE SURE YOU:   Understand these instructions.  Will watch your condition.  Will get help right away if you are not doing well or get worse. Document Released: 10/19/2004 Document Revised: 01/14/2013 Document Reviewed: 08/15/2007 [facility] Patient Information 2015 [place], [place]. This information is not intended to replace advice given to you by your health care provider. Make sure you discuss any questions you have with your health care provider.  Abdominal Pain Many things can cause abdominal pain. Usually, abdominal pain is not caused by a disease and will improve without treatment. It can often be observed and treated at home. Your health care provider will do a physical exam and possibly order blood tests and X-rays to help determine the seriousness of your pain. However, in many cases, more time must pass before a clear cause of the pain can be found. Before that point, your health care provider may not know if you need more testing or further treatment. HOME CARE INSTRUCTIONS  Monitor your abdominal pain for any changes. The following actions may help to alleviate any discomfort you are experiencing:  Only take over-the-counter or prescription medicines as directed by your health care provider.  Do not take laxatives unless directed to do so by your health care provider.  Try a clear liquid diet (broth, tea, or water) as directed by your health care provider. Slowly move to a bland diet as tolerated. SEEK MEDICAL CARE IF:  You have unexplained abdominal pain.  You have abdominal pain associated with nausea or diarrhea.  You have pain when you urinate or have a bowel movement.  You experience abdominal pain that wakes you in the night.  You have abdominal pain that is worsened or improved by eating food.  You have abdominal pain that is worsened with eating fatty foods.  You have a fever. SEEK IMMEDIATE MEDICAL CARE IF:   Your pain does not go  away within 2 hours.  You keep throwing up (vomiting).  Your pain is felt only in portions of the abdomen, such as the right side or the left lower portion of the abdomen.  You pass bloody or black tarry stools. MAKE SURE YOU:  Understand these instructions.   Will watch your condition.   Will get help right away if you are not doing well or get worse.  Document Released: 10/19/2004 Document Revised: 01/14/2013 Document Reviewed: 09/18/2012 [facility] Patient Information 2015 [place], [place]. This information is not intended to replace advice given to you by your health care provider. Make sure you discuss any questions you have with your health care provider.

## 2014-02-08 NOTE — ED Provider Notes (Signed)
CSN: [id number]     Arrival date & time 02/08/14  1025 History   First MD Initiated Contact with Patient 02/08/14 1049     Chief Complaint  Patient presents with  . Chest Pain  . Dizziness      Patient is a 74 y.o. male presenting with chest pain and dizziness. The history is provided by the patient. No language interpreter was used.  Chest Pain Associated symptoms: dizziness   Dizziness Associated symptoms: chest pain     Pt presents with chest pain and abdominal pain that has been present since last June.  Pain is intermittent in nature.  Has constant pain, but has worse spells intermittently.  He had a few this week.  Worst spell was last night.  Pain is described as sharp, on the left side of abdomen and chest, wraps around the side, down to the groin and around to the mid back.  Pain is there for a few minutes at a time.  Denies fevers, vomiting, diarrhea.  Has occasional sob and nausea.  He reports he's had multiple workups for this pain before. He is scheduled to have a colonoscopy on January 26. Pain is resolved the emergency department. He took one nitroglycerin last night and his pain resolved at that time. Himself without anything today.  Past Medical History  Diagnosis Date  . Hypertension   . Pancreatitis Dec 2011    ERCP ok.  . Hiatal hernia   . Chronic chest pain   . Left bundle branch block   . GERD (gastroesophageal reflux disease)   . Hyperlipidemia   . Statin intolerance   . Esophageal stricture   . Renal cyst     Seen on CT 08/2011 also with circumferential bladder wall thickening  . Anginal pain   . Anemia 10/06/2011  . Colon polyp     adenomatous  . Hemorrhoids   . Diverticulosis   . Atrial fibrillation   . Coronary artery disease     a. S/p CABG in 2001. b. S/p DES to protected LM and BMS to RCA 2006. c. 12/2009: s/p DES to Cx;  d. 09/2011 Cath: patent stents, patent grafts -->Med Rx.;  e. CP with abnl Nuc => LHC 10/04/12: oLM 30%, dLM stent into the CFX  patent, LAD occluded, LIMA-LAD patent, RI 30%, mid AVCFX 30%, oOM1 50%, oRCA stent patent, mRCA 50-60%, Radial graft-Dx patent; EF 55%.=> Med Rx  . Carotid stenosis 04/10/11    a. s/p left carotid endarterectomy 04/14/2011.;  b.  Carotid US (11/13):  L CEA ok; RICA 1-39%  . Prostate cancer     s/p cryoablation  . Skin cancer     "cut it off my right ear"  . Chronic bronchitis     "get it q yr"  . Daily headache     "for the last couple weeks" (08/13/2013)  . Arthritis     "joints hurt; shoulders, arms, back" (08/13/2013)  . Chronic lower back pain    Past Surgical History  Procedure Laterality Date  . Cholecystectomy    . Endarterectomy  04/14/2011    Procedure: ENDARTERECTOMY CAROTID;  Surgeon: Mal Misty, MD;  Location: [place];  Service: Vascular;  Laterality: Left;  Would like to perform procedure first, at 0730  . Pr vein bypass graft,aorto-fem-pop    . Inguinal hernia repair Bilateral   . Cardiac catheterization    . Coronary angioplasty with stent placement      "1 + 1"  . Coronary  artery bypass graft  2001  . Prostate cryoablation    . Left heart catheterization with coronary/graft angiogram N/A 10/05/2011    Procedure: LEFT HEART CATHETERIZATION WITH Beatrix Fetters;  Surgeon: Hillary Bow, MD;  Location: [facility] CATH LAB;  Service: Cardiovascular;  Laterality: N/A;  . Left heart catheterization with coronary/graft angiogram N/A 10/04/2012    Procedure: LEFT HEART CATHETERIZATION WITH Beatrix Fetters;  Surgeon: Jolaine Artist, MD;  Location: [facility] C[facility] LAB;  Service: Cardiovascular;  Laterality: N/A;  . Colonoscopy    . Polypectomy     Family History  Problem Relation Age of Onset  . Heart disease Mother     Heart Disease before age 55  . Hypertension Mother   . Heart attack Mother   . Lung cancer Father   . Hypertension Sister   . Heart disease Sister     Heart Disease before age 86  . Cancer Sister   . Heart attack Sister   . Hypertension Brother    . Heart disease Brother     Heart Disease before age 78  . Heart attack Brother   . Colon cancer Neg Hx    History  Substance Use Topics  . Smoking status: Former Smoker -- .1 years    Types: Cigarettes  . Smokeless tobacco: Current User    Types: Chew     Comment: "smoked a few cigarettes; no more than 1 month"  . Alcohol Use: No     Comment: "no alcohol since I was a teenager"    Review of Systems  Cardiovascular: Positive for chest pain.  Neurological: Positive for dizziness.  All other systems reviewed and are negative.     Allergies  Amoxicillin-pot clavulanate; Codeine; Erythromycin; Hydrocodone; Morphine; Nitrofuran derivatives; Oxycodone hcl; Shellfish allergy; Tramadol; and Zolpidem tartrate  Home Medications   Prior to Admission medications   Medication Sig Start Date End Date Taking? Authorizing Provider  amLODipine (NORVASC) 10 MG tablet Take 10 mg by mouth daily.   Yes Historical Provider, MD  aspirin EC 81 MG tablet Take 81 mg by mouth at bedtime.    Yes Historical Provider, MD  diphenhydrAMINE (BENADRYL) 25 MG tablet Take 25 mg by mouth every 8 (eight) hours as needed for allergies.   Yes Historical Provider, MD  famotidine (PEPCID) 20 MG tablet Take 1 tablet (20 mg total) by mouth 2 (two) times daily. 08/12/13  Yes Johnna Acosta, MD  isosorbide dinitrate (ISORDIL) 20 MG tablet Take 20 mg by mouth 2 (two) times daily.   Yes Historical Provider, MD  metoCLOPramide (REGLAN) 10 MG tablet Take 5 mg by mouth 4 (four) times daily as needed. Stomach pain   Yes Historical Provider, MD  metoprolol (LOPRESSOR) 50 MG tablet Take 25 mg by mouth 2 (two) times daily.    Yes Historical Provider, MD  nitroGLYCERIN (NITROSTAT) 0.4 MG SL tablet Place 1 tablet (0.4 mg total) under the tongue every 5 (five) minutes as needed. For chest pain 07/21/13  Yes Scott Joylene Draft, PA-C  pantoprazole (PROTONIX) 40 MG tablet Take 40 mg by mouth 2 (two) times daily.    Yes Historical Provider,  MD  dicyclomine (BENTYL) 10 MG capsule Take 1 capsule (10 mg total) by mouth 4 (four) times daily -  before meals and at bedtime. Patient not taking: Reported on 02/08/2014 08/15/13   Brett Canales, PA-C  MOVIPREP 100 G SOLR Take 1 kit (200 g total) by mouth once. moviprep as directed. No substitutions Patient not  taking: Reported on 02/08/2014 02/05/14   Irene Shipper, MD   BP 102/87 mmHg  Pulse 61  Temp(Src) 97.9 F (36.6 C)  Resp 16  SpO2 98% Physical Exam  Constitutional: He is oriented to person, place, and time. He appears well-developed and well-nourished.  HENT:  Head: Normocephalic and atraumatic.  Cardiovascular: Normal rate and regular rhythm.   SEM at LSB  Pulmonary/Chest: Effort normal and breath sounds normal. No respiratory distress.  Abdominal: Soft. There is no rebound and no guarding.  Mild epigastric tenderness  Musculoskeletal: He exhibits no edema or tenderness.  Neurological: He is alert and oriented to person, place, and time.  Skin: Skin is warm and dry.  Psychiatric: He has a normal mood and affect. His behavior is normal.  Nursing note and vitals reviewed.   ED Course  Procedures (including critical care time) Labs Review Labs Reviewed  BASIC METABOLIC PANEL - Abnormal; Notable for the following:    Glucose, Bld 101 (*)    GFR calc non Af Amer 63 (*)    GFR calc Af Amer 73 (*)    All other components within normal limits  BRAIN NATRIURETIC PEPTIDE - Abnormal; Notable for the following:    B Natriuretic Peptide 103.3 (*)    All other components within normal limits  URINALYSIS, ROUTINE W REFLEX MICROSCOPIC - Abnormal; Notable for the following:    APPearance CLOUDY (*)    All other components within normal limits  CBC  I-STAT TROPOININ, ED    Imaging Review Dg Chest 2 View  02/08/2014   CLINICAL DATA:  Acute onset chest pain, shortness of breath, headache and dizziness which began yesterday. Intermittent blurred vision and right facial tingling  recently. Current history of hypertension. Prior CABG.  EXAM: CHEST  2 VIEW  COMPARISON:  08/13/2013 dating back to 04/12/2011. CTA chest 08/12/2013.  FINDINGS: Sternotomy for CABG. Cardiac silhouette upper normal in size, unchanged. Thoracic aorta tortuous and atherosclerotic, unchanged. Hilar and mediastinal contours otherwise unremarkable. Suboptimal inspiration on the PA image with improved aeration normal lateral. Lungs clear. Bronchovascular markings normal. Pulmonary vascularity normal. No visible pleural effusions. No pneumothorax. Degenerative changes involving the thoracic upper lumbar spine.  IMPRESSION: No acute cardiopulmonary disease.   Electronically Signed   By: Evangeline Dakin M.D.   On: 02/08/2014 12:48   Ct Abdomen Pelvis W Contrast  02/08/2014   CLINICAL DATA:  Diffuse abdominal pain for the past 2 weeks. Nausea.  EXAM: CT ABDOMEN AND PELVIS WITH CONTRAST  TECHNIQUE: Multidetector CT imaging of the abdomen and pelvis was performed using the standard protocol following bolus administration of intravenous contrast.  CONTRAST:  151m OMNIPAQUE IOHEXOL 300 MG/ML  SOLN  COMPARISON:  Previous examinations, the most recent dated 08/12/2013 and 08/01/2013.  FINDINGS: Multiple liver and bilateral renal cysts are again demonstrated. Mild diffuse low density of the liver relative to the spleen. The spleen, pancreas and adrenal glands have normal appearances. There is a probable central TUR defect in the prostate gland with plaque-like enlargement of the prostate gland at the bladder base. No definite mass is seen. Prominent stool in the rectosigmoid colon and descending colon. No dilated bowel loops or enlarged lymph nodes. No evidence of appendicitis. Mild linear atelectasis at both lung bases. Lumbar and lower thoracic spine degenerative changes.  IMPRESSION: 1. No acute abnormality. 2. Mild diffuse hepatic steatosis.   Electronically Signed   By: SEnrique SackM.D.   On: 02/08/2014 15:49     EKG  Interpretation  Date/Time:  Sunday February 08 2014 10:28:23 EST Ventricular Rate:  83 PR Interval:  116 QRS Duration: 136 QT Interval:  412 QTC Calculation: 484 R Axis:   134 Text Interpretation:  Normal sinus rhythm Right axis deviation  Non-specific intra-ventricular conduction block T wave abnormality,  consider inferolateral ischemia Abnormal ECG Confirmed by Hazle Coca  [phone]) on 02/08/2014 10:48:57 AM      MDM   Final diagnoses:  Chest pain, atypical  Abdominal pain, unspecified abdominal location    Patient presents for evaluation of acute on chronic chest and abdominal pain. He's had multiple evaluations for this pain previously. Back in July 2015 he was admitted to cardiology for similar symptoms and had a stress test that was negative at that time as well as imaging of his abdomen performed. There is no evidence of dissection and he was referred to GI for evaluation of mesenteric ischemia. There is no evidence of acute infectious process currently. EKG is similar to priors, clinical picture is not consistent with ACS, PE, dissection. Discussed with patient importance of keeping his gastroenterology appointment as scheduled as well as making a cardiology appointment for later this week. Return precautions were provided. Patient and daughter think that some of this pain may be anxiety induced because he reports feeling anxious before these pain episodes began. Provided prescription for Ativan to assess for patient's relief.    Quintella Reichert, MD 02/08/14 [phone]

## 2014-02-08 NOTE — ED Notes (Signed)
CT notified that pt has completed PO contrast

## 2014-02-08 NOTE — ED Notes (Signed)
Pt. Reports intermittent pain "all over". States it's starts in one spot and moves to another. Reported chest pain/abdominal pain last night. Pt. Also reporting dizziness.

## 2014-02-16 ENCOUNTER — Telehealth: Payer: Self-pay | Admitting: Internal Medicine

## 2014-02-16 NOTE — Telephone Encounter (Signed)
No charge. 

## 2014-02-17 ENCOUNTER — Encounter: Payer: Medicare Other | Admitting: Internal Medicine

## 2014-02-20 DIAGNOSIS — I2510 Atherosclerotic heart disease of native coronary artery without angina pectoris: Secondary | ICD-10-CM | POA: Diagnosis not present

## 2014-02-20 DIAGNOSIS — J309 Allergic rhinitis, unspecified: Secondary | ICD-10-CM | POA: Diagnosis not present

## 2014-02-20 DIAGNOSIS — F419 Anxiety disorder, unspecified: Secondary | ICD-10-CM | POA: Diagnosis not present

## 2014-02-20 DIAGNOSIS — Z1389 Encounter for screening for other disorder: Secondary | ICD-10-CM | POA: Diagnosis not present

## 2014-02-20 DIAGNOSIS — I10 Essential (primary) hypertension: Secondary | ICD-10-CM | POA: Diagnosis not present

## 2014-02-20 DIAGNOSIS — H9313 Tinnitus, bilateral: Secondary | ICD-10-CM | POA: Diagnosis not present

## 2014-02-20 DIAGNOSIS — K219 Gastro-esophageal reflux disease without esophagitis: Secondary | ICD-10-CM | POA: Diagnosis not present

## 2014-02-25 ENCOUNTER — Ambulatory Visit (AMBULATORY_SURGERY_CENTER): Payer: Medicare Other | Admitting: Internal Medicine

## 2014-02-25 ENCOUNTER — Encounter: Payer: Self-pay | Admitting: Internal Medicine

## 2014-02-25 VITALS — BP 130/66 | HR 60 | Temp 99.1°F | Resp 17 | Ht 64.0 in | Wt 141.0 lb

## 2014-02-25 DIAGNOSIS — Z86010 Personal history of colonic polyps: Secondary | ICD-10-CM

## 2014-02-25 DIAGNOSIS — G8929 Other chronic pain: Secondary | ICD-10-CM | POA: Diagnosis not present

## 2014-02-25 DIAGNOSIS — D122 Benign neoplasm of ascending colon: Secondary | ICD-10-CM | POA: Diagnosis not present

## 2014-02-25 DIAGNOSIS — R1013 Epigastric pain: Secondary | ICD-10-CM | POA: Diagnosis not present

## 2014-02-25 DIAGNOSIS — R1314 Dysphagia, pharyngoesophageal phase: Secondary | ICD-10-CM

## 2014-02-25 DIAGNOSIS — I2510 Atherosclerotic heart disease of native coronary artery without angina pectoris: Secondary | ICD-10-CM | POA: Diagnosis not present

## 2014-02-25 DIAGNOSIS — K222 Esophageal obstruction: Secondary | ICD-10-CM

## 2014-02-25 DIAGNOSIS — R1084 Generalized abdominal pain: Secondary | ICD-10-CM

## 2014-02-25 DIAGNOSIS — Z1211 Encounter for screening for malignant neoplasm of colon: Secondary | ICD-10-CM | POA: Diagnosis not present

## 2014-02-25 MED ORDER — SODIUM CHLORIDE 0.9 % IV SOLN: 500.0000 mL | INTRAVENOUS | Status: DC

## 2014-02-25 NOTE — Patient Instructions (Signed)
YOU HAD AN ENDOSCOPIC PROCEDURE TODAY AT [facility]: Refer to the procedure report that was given to you for any specific questions about what was found during the examination.  If the procedure report does not answer your questions, please call your gastroenterologist to clarify.  If you requested that your care partner not be given the details of your procedure findings, then the procedure report has been included in a sealed envelope for you to review at your convenience later.  YOU SHOULD EXPECT: Some feelings of bloating in the abdomen. Passage of more gas than usual.  Walking can help get rid of the air that was put into your GI tract during the procedure and reduce the bloating. If you had a lower endoscopy (such as a colonoscopy or flexible sigmoidoscopy) you may notice spotting of blood in your stool or on the toilet paper. If you underwent a bowel prep for your procedure, then you may not have a normal bowel movement for a few days.  DIET: FOLLOW DILATION HANDOUT    ACTIVITY: Your care partner should take you home directly after the procedure.  You should plan to take it easy, moving slowly for the rest of the day.  You can resume normal activity the day after the procedure however you should NOT DRIVE or use heavy machinery for 24 hours (because of the sedation medicines used during the test).    SYMPTOMS TO REPORT IMMEDIATELY: A gastroenterologist can be reached at any hour.  During normal business hours, 8:30 AM to 5:00 PM Monday through Friday, call [phone].  After hours and on weekends, please call the GI answering service at [phone] who will take a message and have the physician on call contact you.   Following lower endoscopy (colonoscopy or flexible sigmoidoscopy):  Excessive amounts of blood in the stool  Significant tenderness or worsening of abdominal pains  Swelling of the abdomen that is new, acute  Fever of 100F or higher  Following upper  endoscopy (EGD)  Vomiting of blood or coffee ground material  New chest pain or pain under the shoulder blades  Painful or persistently difficult swallowing  New shortness of breath  Fever of 100F or higher  Black, tarry-looking stools  FOLLOW UP: If any biopsies were taken you will be contacted by phone or by letter within the next 1-3 weeks.  Call your gastroenterologist if you have not heard about the biopsies in 3 weeks.  Our staff will call the home number listed on your records the next business day following your procedure to check on you and address any questions or concerns that you may have at that time regarding the information given to you following your procedure. This is a courtesy call and so if there is no answer at the home number and we have not heard from you through the emergency physician on call, we will assume that you have returned to your regular daily activities without incident.  SIGNATURES/CONFIDENTIALITY: You and/or your care partner have signed paperwork which will be entered into your electronic medical record.  These signatures attest to the fact that that the information above on your After Visit Summary has been reviewed and is understood.  Full responsibility of the confidentiality of this discharge information lies with you and/or your care-partner.   Resume medications. Information given on Dilation Diet, stricture, polyps, diverticulosis and high fiber diet with discharge instructions.

## 2014-02-25 NOTE — Progress Notes (Signed)
A/ox3 pleased with MAC, report to Sheila RN 

## 2014-02-25 NOTE — Op Note (Signed)
[place]  [address]. [place], [address]   ENDOSCOPY PROCEDURE REPORT  PATIENT: [name], [name]  MR#: [id number] BIRTHDATE: 03/01/1940 , 47  yrs. old GENDER: male ENDOSCOPIST: Eustace Quail, MD REFERRED BY:  .Direct Self PROCEDURE DATE:  02/25/2014 PROCEDURE:  EGD, diagnostic and Maloney dilation of esophagus  - 54 French Maloney ASA CLASS:     Class II INDICATIONS:  abdominal pain, dysphagia, and therapeutic procedure.  MEDICATIONS: Monitored anesthesia care and Propofol 100 mg IV TOPICAL ANESTHETIC: none  DESCRIPTION OF PROCEDURE: After the risks benefits and alternatives of the procedure were thoroughly explained, informed consent was obtained.  The [id number] endoscope was introduced through the mouth and advanced to the second portion of the duodenum , Without limitations.  The instrument was slowly withdrawn as the mucosa was fully examined.  EXAM:The esophagus revealed a large caliber distal esophageal ring but was otherwise normal.  The stomach was normal.  The duodenum was normal.  Retroflexed views revealed a hiatal hernia.     The scope was then withdrawn from the patient and the procedure completed. THERAPY: 54 French Maloney dilator was passed into the esophagus without resistance or heme. Tolerated well  COMPLICATIONS: There were no immediate complications.  ENDOSCOPIC IMPRESSION: 1. GERD 2. Distal esophageal stricture status post dilation  RECOMMENDATIONS: 1.  Clear liquids until 5 PM, then soft foods rest of day.  Resume prior diet tomorrow. 2.  Continue PPI    (your acid reflux medication)  REPEAT EXAM:  eSigned:  Eustace Quail, MD 02/25/2014 3:33 PM    FW:YOVZC Hamrick, MD and The Patient

## 2014-02-25 NOTE — Progress Notes (Signed)
Pt. Daughter is at car dealership with car,unknown when daughter is going to be able to come and get them. Wife and pt. Stated that they do not have anyone else to pick them up.staff attempting to find solutions for transportation.

## 2014-02-25 NOTE — Op Note (Signed)
[place]  [employer]. [place], [address]   COLONOSCOPY PROCEDURE REPORT  PATIENT: [name] [name], [name] [name]  MR#: [id number] BIRTHDATE: 12-Jul-1940 , 8  yrs. old GENDER: male ENDOSCOPIST: Eustace Quail, MD REFERRED OI:NOMVEHMCNOBS Program Recall PROCEDURE DATE:  02/25/2014 PROCEDURE:   Colonoscopy with snare polypectomy x 2 First Screening Colonoscopy - Avg.  risk and is 50 yrs.  old or older - No.  Prior Negative Screening - Now for repeat screening. N/A  History of Adenoma - Now for follow-up colonoscopy & has been > or = to 3 yrs.  Yes hx of adenoma.  Has been 3 or more years since last colonoscopy.  Polyps Removed Today? Yes. ASA CLASS:   Class II INDICATIONS:surveillance colonoscopy based on a history of adenomatous colonic polyp(s). Previous colonoscopies 2002, 2005, 2010 (small adenoma). MEDICATIONS: Monitored anesthesia care and Propofol 200 mg IV  DESCRIPTION OF PROCEDURE:   After the risks benefits and alternatives of the procedure were thoroughly explained, informed consent was obtained.  The digital rectal exam revealed no abnormalities of the rectum.   The LB [id number]  endoscope was introduced through the anus and advanced to the cecum, which was identified by both the appendix and ileocecal valve. No adverse events experienced.   The quality of the prep was excellent, using MoviPrep  The instrument was then slowly withdrawn as the colon was fully examined.  COLON FINDINGS: Two polyps measuring 3 mm in size were found in the ascending colon.  A polypectomy was performed with a cold snare. The resection was complete, the polyp tissue was completely retrieved and sent to histology.   There was moderate diverticulosis noted in the sigmoid colon.   The examination was otherwise normal.  Retroflexed views revealed internal hemorrhoids. The time to cecum=1 minutes 55 seconds.  Withdrawal time=8 minutes 42 seconds.  The scope was withdrawn and the  procedure completed. COMPLICATIONS: There were no immediate complications.  ENDOSCOPIC IMPRESSION: 1.   Two polyps measuring 3 mm in size were found in the ascending colon; polypectomy was performed with a cold snare 2.   Moderate diverticulosis was noted in the sigmoid colon 3.   The examination was otherwise normal  RECOMMENDATIONS: 1.  Follow up colonoscopy in 5 years, if polyps adenomatous and medically fit 2.  Upper endoscopy performed today (please see results)  eSigned:  Eustace Quail, MD 02/25/2014 3:22 PM   cc: Daiva Eves, MD and The Patient

## 2014-02-25 NOTE — Progress Notes (Signed)
Called to room to assist during endoscopic procedure.  Patient ID and intended procedure confirmed with present staff. Received instructions for my participation in the procedure from the performing physician.  

## 2014-02-26 ENCOUNTER — Telehealth: Payer: Self-pay | Admitting: *Deleted

## 2014-02-26 NOTE — Telephone Encounter (Signed)
  Follow up Call-  Call back number 02/25/2014  Post procedure Call Back phone  # [phone]  Permission to leave phone message Yes     Patient questions:  Do you have a fever, pain , or abdominal swelling? No. Pain Score  0 *  Have you tolerated food without any problems? Yes.    Have you been able to return to your normal activities? Yes.    Do you have any questions about your discharge instructions: Diet   No. Medications  No. Follow up visit  No.  Do you have questions or concerns about your Care? No.  Actions: * If pain score is 4 or above: No action needed, pain <4.

## 2014-03-03 ENCOUNTER — Other Ambulatory Visit (HOSPITAL_COMMUNITY): Payer: Medicare Other

## 2014-03-03 ENCOUNTER — Ambulatory Visit: Payer: Medicare Other | Admitting: Family

## 2014-03-03 ENCOUNTER — Encounter: Payer: Self-pay | Admitting: Internal Medicine

## 2014-03-18 ENCOUNTER — Ambulatory Visit: Payer: Medicare Other | Admitting: Family

## 2014-03-18 ENCOUNTER — Other Ambulatory Visit (HOSPITAL_COMMUNITY): Payer: Medicare Other

## 2014-08-03 ENCOUNTER — Encounter (HOSPITAL_COMMUNITY): Payer: Self-pay | Admitting: Emergency Medicine

## 2014-08-03 ENCOUNTER — Emergency Department (HOSPITAL_COMMUNITY): Payer: Medicare Other

## 2014-08-03 ENCOUNTER — Observation Stay (HOSPITAL_COMMUNITY)
Admission: EM | Admit: 2014-08-03 | Discharge: 2014-08-04 | Disposition: A | Discharge status: Home / Self Care | Payer: Medicare Other | Attending: Internal Medicine | Admitting: Internal Medicine

## 2014-08-03 DIAGNOSIS — M1990 Unspecified osteoarthritis, unspecified site: Secondary | ICD-10-CM | POA: Insufficient documentation

## 2014-08-03 DIAGNOSIS — K635 Polyp of colon: Secondary | ICD-10-CM | POA: Insufficient documentation

## 2014-08-03 DIAGNOSIS — K5790 Diverticulosis of intestine, part unspecified, without perforation or abscess without bleeding: Secondary | ICD-10-CM | POA: Diagnosis not present

## 2014-08-03 DIAGNOSIS — Z7982 Long term (current) use of aspirin: Secondary | ICD-10-CM | POA: Diagnosis not present

## 2014-08-03 DIAGNOSIS — I25119 Atherosclerotic heart disease of native coronary artery with unspecified angina pectoris: Secondary | ICD-10-CM | POA: Insufficient documentation

## 2014-08-03 DIAGNOSIS — R61 Generalized hyperhidrosis: Secondary | ICD-10-CM | POA: Insufficient documentation

## 2014-08-03 DIAGNOSIS — G8929 Other chronic pain: Secondary | ICD-10-CM | POA: Diagnosis not present

## 2014-08-03 DIAGNOSIS — K449 Diaphragmatic hernia without obstruction or gangrene: Secondary | ICD-10-CM | POA: Insufficient documentation

## 2014-08-03 DIAGNOSIS — Q6100 Congenital renal cyst, unspecified: Secondary | ICD-10-CM | POA: Insufficient documentation

## 2014-08-03 DIAGNOSIS — K219 Gastro-esophageal reflux disease without esophagitis: Secondary | ICD-10-CM | POA: Insufficient documentation

## 2014-08-03 DIAGNOSIS — I10 Essential (primary) hypertension: Secondary | ICD-10-CM | POA: Diagnosis not present

## 2014-08-03 DIAGNOSIS — K222 Esophageal obstruction: Secondary | ICD-10-CM | POA: Diagnosis not present

## 2014-08-03 DIAGNOSIS — Z87891 Personal history of nicotine dependence: Secondary | ICD-10-CM | POA: Insufficient documentation

## 2014-08-03 DIAGNOSIS — R079 Chest pain, unspecified: Principal | ICD-10-CM

## 2014-08-03 DIAGNOSIS — I447 Left bundle-branch block, unspecified: Secondary | ICD-10-CM | POA: Insufficient documentation

## 2014-08-03 DIAGNOSIS — R0789 Other chest pain: Secondary | ICD-10-CM | POA: Diagnosis not present

## 2014-08-03 DIAGNOSIS — I6529 Occlusion and stenosis of unspecified carotid artery: Secondary | ICD-10-CM | POA: Insufficient documentation

## 2014-08-03 DIAGNOSIS — E785 Hyperlipidemia, unspecified: Secondary | ICD-10-CM | POA: Insufficient documentation

## 2014-08-03 DIAGNOSIS — K649 Unspecified hemorrhoids: Secondary | ICD-10-CM | POA: Insufficient documentation

## 2014-08-03 DIAGNOSIS — I4891 Unspecified atrial fibrillation: Secondary | ICD-10-CM | POA: Insufficient documentation

## 2014-08-03 DIAGNOSIS — Z79899 Other long term (current) drug therapy: Secondary | ICD-10-CM | POA: Diagnosis not present

## 2014-08-03 DIAGNOSIS — K859 Acute pancreatitis, unspecified: Secondary | ICD-10-CM | POA: Diagnosis not present

## 2014-08-03 DIAGNOSIS — Z85828 Personal history of other malignant neoplasm of skin: Secondary | ICD-10-CM | POA: Insufficient documentation

## 2014-08-03 DIAGNOSIS — Z880 Allergy status to penicillin: Secondary | ICD-10-CM | POA: Diagnosis not present

## 2014-08-03 DIAGNOSIS — Z8546 Personal history of malignant neoplasm of prostate: Secondary | ICD-10-CM | POA: Insufficient documentation

## 2014-08-03 DIAGNOSIS — R0602 Shortness of breath: Secondary | ICD-10-CM | POA: Insufficient documentation

## 2014-08-03 DIAGNOSIS — D649 Anemia, unspecified: Secondary | ICD-10-CM | POA: Insufficient documentation

## 2014-08-03 LAB — BASIC METABOLIC PANEL
ANION GAP: 5 (ref 5–15)
BUN: 13 mg/dL (ref 6–20)
CALCIUM: 9.2 mg/dL (ref 8.9–10.3)
CO2: 28 mmol/L (ref 22–32)
Chloride: 105 mmol/L (ref 101–111)
Creatinine, Ser: 1.2 mg/dL (ref 0.61–1.24)
GFR calc Af Amer: >60 mL/min (ref >60)
GFR calc non Af Amer: 58 mL/min — ABNORMAL LOW (ref >60)
Glucose, Bld: 103 mg/dL — ABNORMAL HIGH (ref 65–99)
Potassium: 4.9 mmol/L (ref 3.5–5.1)
Sodium: 138 mmol/L (ref 135–145)

## 2014-08-03 LAB — CBC
HEMATOCRIT: 38.4 % — AB (ref 39.0–52.0)
HEMOGLOBIN: 13.2 g/dL (ref 13.0–17.0)
MCH: 29.4 pg (ref 26.0–34.0)
MCHC: 34.4 g/dL (ref 30.0–36.0)
MCV: 85.5 fL (ref 78.0–100.0)
Platelets: 179 10*3/uL (ref 150–400)
RBC: 4.49 MIL/uL (ref 4.22–5.81)
RDW: 13.3 % (ref 11.5–15.5)
WBC: 5.3 10*3/uL (ref 4.0–10.5)

## 2014-08-03 LAB — TROPONIN I
Troponin I: <0.03 ng/mL (ref <0.031)
Troponin I: <0.03 ng/mL (ref <0.031)

## 2014-08-03 LAB — I-STAT TROPONIN, ED: TROPONIN I, POC: 0.01 ng/mL (ref 0.00–0.08)

## 2014-08-03 MED ORDER — FAMOTIDINE 20 MG PO TABS
20.0000 mg | ORAL_TABLET | Freq: Two times a day (BID) | ORAL | Status: DC
  Administered 2014-08-03 – 2014-08-04 (×2): 20 mg via ORAL
  Filled 2014-08-03 (×2): qty 1

## 2014-08-03 MED ORDER — NITROGLYCERIN 0.4 MG SL SUBL: 0.4000 mg | SUBLINGUAL_TABLET | SUBLINGUAL | Status: DC | PRN

## 2014-08-03 MED ORDER — AMLODIPINE BESYLATE 10 MG PO TABS
10.0000 mg | ORAL_TABLET | Freq: Every day | ORAL | Status: DC
  Administered 2014-08-04: 10 mg via ORAL
  Filled 2014-08-03: qty 1

## 2014-08-03 MED ORDER — HEPARIN SODIUM (PORCINE) 5000 UNIT/ML IJ SOLN
5000.0000 [IU] | Freq: Three times a day (TID) | INTRAMUSCULAR | Status: DC
  Administered 2014-08-03 – 2014-08-04 (×2): 5000 [IU] via SUBCUTANEOUS
  Filled 2014-08-03 (×2): qty 1

## 2014-08-03 MED ORDER — DIPHENHYDRAMINE HCL 25 MG PO CAPS: 25.0000 mg | ORAL_CAPSULE | Freq: Three times a day (TID) | ORAL | Status: DC | PRN

## 2014-08-03 MED ORDER — PANTOPRAZOLE SODIUM 40 MG PO TBEC
40.0000 mg | DELAYED_RELEASE_TABLET | Freq: Two times a day (BID) | ORAL | Status: DC
  Administered 2014-08-03 – 2014-08-04 (×2): 40 mg via ORAL
  Filled 2014-08-03 (×2): qty 1

## 2014-08-03 MED ORDER — DICYCLOMINE HCL 10 MG PO CAPS
10.0000 mg | ORAL_CAPSULE | Freq: Three times a day (TID) | ORAL | Status: DC
  Administered 2014-08-03 – 2014-08-04 (×3): 10 mg via ORAL
  Filled 2014-08-03 (×3): qty 1

## 2014-08-03 MED ORDER — METOPROLOL TARTRATE 25 MG PO TABS
25.0000 mg | ORAL_TABLET | Freq: Two times a day (BID) | ORAL | Status: DC
  Administered 2014-08-03: 25 mg via ORAL
  Filled 2014-08-03 (×2): qty 1

## 2014-08-03 MED ORDER — ASPIRIN EC 81 MG PO TBEC
81.0000 mg | DELAYED_RELEASE_TABLET | Freq: Every day | ORAL | Status: DC
  Administered 2014-08-03: 81 mg via ORAL
  Filled 2014-08-03: qty 1

## 2014-08-03 MED ORDER — ONDANSETRON HCL 4 MG/2ML IJ SOLN
4.0000 mg | Freq: Four times a day (QID) | INTRAMUSCULAR | Status: DC | PRN
  Administered 2014-08-04: 4 mg via INTRAVENOUS
  Filled 2014-08-03: qty 2

## 2014-08-03 MED ORDER — ISOSORBIDE DINITRATE 10 MG PO TABS
20.0000 mg | ORAL_TABLET | Freq: Two times a day (BID) | ORAL | Status: DC
  Administered 2014-08-03 – 2014-08-04 (×2): 20 mg via ORAL
  Filled 2014-08-03 (×2): qty 2

## 2014-08-03 MED ORDER — ACETAMINOPHEN 325 MG PO TABS: 650.0000 mg | ORAL_TABLET | ORAL | Status: DC | PRN

## 2014-08-03 NOTE — ED Provider Notes (Signed)
CSN: [id number]     Arrival date & time 08/03/14  1138 History   First MD Initiated Contact with Patient 08/03/14 1222     Chief Complaint  Patient presents with  . Chest Pain  . Shortness of Breath     (Consider location/radiation/quality/duration/timing/severity/associated sxs/prior Treatment) HPI Comments: Patient presents with chest pain and shortness of breath. He has a history of coronary artery disease status post remote bypass surgery involving the left main RCA and left circumflex. His last catheterization was in September 2014 which showed continued patency of arteries. 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 He had a stress test in July 2015 which did not show any acute changes. He states he chronically has pain in the center of his chest going to his left arm. He states it's been worse over last 4 days and seems more intense and frequent. He states it does seem to get worse after he eats or drinks but he also says he gets worse with minimal exertion. He does have some associated shortness of breath. There is no nausea. He does report an episode of diaphoresis this morning. He's been using his nitroglycerin without significant relief. He currently denies any pain. He denies any cough or chest congestion. He denies any leg pain or swelling.  Patient is a 74 y.o. male presenting with chest pain and shortness of breath.  Chest Pain Associated symptoms: diaphoresis and shortness of breath   Associated symptoms: no abdominal pain, no back pain, no cough, no dizziness, no fatigue, no fever, no headache, no nausea, no numbness, not vomiting and no weakness   Shortness of Breath Associated symptoms: chest pain and diaphoresis   Associated symptoms: no abdominal pain, no cough, no fever, no headaches, no rash and no vomiting     Past Medical History  Diagnosis Date  . Hypertension   . Pancreatitis Dec 2011    ERCP ok.  . Hiatal hernia   . Chronic  chest pain   . Left bundle branch block   . GERD (gastroesophageal reflux disease)   . Hyperlipidemia   . Statin intolerance   . Esophageal stricture   . Renal cyst     Seen on CT 08/2011 also with circumferential bladder wall thickening  . Anginal pain   . Anemia 10/06/2011  . Colon polyp     adenomatous  . Hemorrhoids   . Diverticulosis   . Atrial fibrillation   . Coronary artery disease     a. S/p CABG in 2001. b. S/p DES to protected LM and BMS to RCA 2006. c. 12/2009: s/p DES to Cx;  d. 09/2011 Cath: patent stents, patent grafts -->Med Rx.;  e. CP with abnl Nuc => LHC 10/04/12: oLM 30%, dLM stent into the CFX patent, LAD occluded, LIMA-LAD patent, RI 30%, mid AVCFX 30%, oOM1 50%, oRCA stent patent, mRCA 50-60%, Radial graft-Dx patent; EF 55%.=> Med Rx  . Carotid stenosis 04/10/11    a. s/p left carotid endarterectomy 04/14/2011.;  b.  Carotid US (11/13):  L CEA ok; RICA 1-39%  . Prostate cancer     s/p cryoablation  . Skin cancer     "cut it off my right ear"  . Chronic bronchitis     "get it q yr"  . Daily headache     "for the last couple weeks" (08/13/2013)  . Arthritis     "joints hurt; shoulders, arms, back" (08/13/2013)  . Chronic lower back pain  Past Surgical History  Procedure Laterality Date  . Cholecystectomy    . Endarterectomy  04/14/2011    Procedure: ENDARTERECTOMY CAROTID;  Surgeon: Mal Misty, MD;  Location: [facility];  Service: Vascular;  Laterality: Left;  Would like to perform procedure first, at 0730  . Pr vein bypass graft,aorto-fem-pop    . Inguinal hernia repair Bilateral   . Cardiac catheterization    . Coronary angioplasty with stent placement      "1 + 1"  . Coronary artery bypass graft  2001  . Prostate cryoablation    . Left heart catheterization with coronary/graft angiogram N/A 10/05/2011    Procedure: LEFT HEART CATHETERIZATION WITH Beatrix Fetters;  Surgeon: Hillary Bow, MD;  Location: [facility] CATH LAB;  Service: Cardiovascular;   Laterality: N/A;  . Left heart catheterization with coronary/graft angiogram N/A 10/04/2012    Procedure: LEFT HEART CATHETERIZATION WITH Beatrix Fetters;  Surgeon: Jolaine Artist, MD;  [facility] C[facility] LAB;  Service: Cardiovascular;  Laterality: N/A;  . Colonoscopy    . Polypectomy     Family History  Problem Relation Age of Onset  . Heart disease Mother     Heart Disease before age 26  . Hypertension Mother   . Heart attack Mother   . Lung cancer Father   . Hypertension Sister   . Heart disease Sister     Heart Disease before age 37  . Cancer Sister   . Heart attack Sister   . Hypertension Brother   . Heart disease Brother     Heart Disease before age 71  . Heart attack Brother   . Colon cancer Neg Hx    History  Substance Use Topics  . Smoking status: Former Smoker -- .1 years    Types: Cigarettes  . Smokeless tobacco: Current User    Types: Chew     Comment: "smoked a few cigarettes; no more than 1 month"  . Alcohol Use: No     Comment: "no alcohol since I was a teenager"    Review of Systems  Constitutional: Positive for diaphoresis. Negative for fever, chills and fatigue.  HENT: Negative for congestion, rhinorrhea and sneezing.   Eyes: Negative.   Respiratory: Positive for shortness of breath. Negative for cough and chest tightness.   Cardiovascular: Positive for chest pain. Negative for leg swelling.  Gastrointestinal: Negative for nausea, vomiting, abdominal pain, diarrhea and blood in stool.  Genitourinary: Negative for frequency, hematuria, flank pain and difficulty urinating.  Musculoskeletal: Negative for back pain and arthralgias.  Skin: Negative for rash.  Neurological: Negative for dizziness, speech difficulty, weakness, numbness and headaches.      Allergies  Amoxicillin-pot clavulanate; Codeine; Erythromycin; Hydrocodone; Morphine; Nitrofuran derivatives; Oxycodone hcl; Shellfish allergy; Tramadol; and Zolpidem tartrate  Home  Medications   Prior to Admission medications   Medication Sig Start Date End Date Taking? Authorizing Provider  amLODipine (NORVASC) 10 MG tablet Take 10 mg by mouth daily.   Yes Historical Provider, MD  aspirin EC 81 MG tablet Take 81 mg by mouth at bedtime.    Yes Historical Provider, MD  dicyclomine (BENTYL) 10 MG capsule Take 1 capsule (10 mg total) by mouth 4 (four) times daily -  before meals and at bedtime. 08/15/13  Yes Brett Canales, PA-C  diphenhydrAMINE (BENADRYL) 25 MG tablet Take 25 mg by mouth every 8 (eight) hours as needed for allergies.   Yes Historical Provider, MD  famotidine (PEPCID) 20 MG tablet Take 1 tablet (20  mg total) by mouth 2 (two) times daily. 08/12/13  Yes Noemi Chapel, MD  isosorbide dinitrate (ISORDIL) 20 MG tablet Take 20 mg by mouth 2 (two) times daily.   Yes Historical Provider, MD  metoprolol tartrate (LOPRESSOR) 25 MG tablet Take 25 mg by mouth 2 (two) times daily. 07/11/14  Yes Historical Provider, MD  pantoprazole (PROTONIX) 40 MG tablet Take 40 mg by mouth 2 (two) times daily.    Yes Historical Provider, MD  LORazepam (ATIVAN) 0.5 MG tablet Take one half to one tablet as needed for anxiety Patient not taking: Reported on 08/03/2014 02/08/14   Quintella Reichert, MD  metoprolol (LOPRESSOR) 50 MG tablet Take 25 mg by mouth 2 (two) times daily.     Historical Provider, MD  nitroGLYCERIN (NITROSTAT) 0.4 MG SL tablet Place 1 tablet (0.4 mg total) under the tongue every 5 (five) minutes as needed. For chest pain Patient not taking: Reported on 02/25/2014 07/21/13   Liliane Shi, PA-C   BP 145/64 mmHg  Pulse 54  Resp 12  SpO2 100% Physical Exam  Constitutional: He is oriented to person, place, and time. He appears well-developed and well-nourished.  HENT:  Head: Normocephalic and atraumatic.  Eyes: Pupils are equal, round, and reactive to light.  Neck: Normal range of motion. Neck supple.  Cardiovascular: Normal rate, regular rhythm and normal heart sounds.    Pulmonary/Chest: Effort normal and breath sounds normal. No respiratory distress. He has no wheezes. He has no rales. He exhibits no tenderness.  Abdominal: Soft. Bowel sounds are normal. There is no tenderness. There is no rebound and no guarding.  Musculoskeletal: Normal range of motion. He exhibits edema.  Trace edema bilaterally, no calf tenderness  Lymphadenopathy:    He has no cervical adenopathy.  Neurological: He is alert and oriented to person, place, and time.  Skin: Skin is warm and dry. No rash noted.  Psychiatric: He has a normal mood and affect.    ED Course  Procedures (including critical care time) Labs Review Labs Reviewed  BASIC METABOLIC PANEL - Abnormal; Notable for the following:    Glucose, Bld 103 (*)    GFR calc non Af Amer 58 (*)    All other components within normal limits  CBC - Abnormal; Notable for the following:    HCT 38.4 (*)    All other components within normal limits  I-STAT TROPOININ, ED    Imaging Review Dg Chest 2 View  08/03/2014   CLINICAL DATA:  Mid chest pain.  EXAM: CHEST  2 VIEW  COMPARISON:  02/08/2014  FINDINGS: Post CABG changes in the chest. Both lungs are clear. Heart size is normal. The trachea is midline. Negative for pleural effusions or airspace disease.  IMPRESSION: No active cardiopulmonary disease.   Electronically Signed   By: Markus Daft M.D.   On: 08/03/2014 13:27     EKG Interpretation   Date/Time:  Monday August 03 2014 11:46:33 EDT Ventricular Rate:  60 PR Interval:  136 QRS Duration: 134 QT Interval:  468 QTC Calculation: 468 R Axis:   81 Text Interpretation:  Normal sinus rhythm Non-specific intra-ventricular  conduction block T wave abnormality, consider inferolateral ischemia  Abnormal ECG similar to prior EKGs Confirmed by Jermany Sundell  MD, Teleshia Lemere ([id number])  on 08/03/2014 1:40:26 PM      MDM   Final diagnoses:  Chest pain, unspecified chest pain type   Pt with hx of CAD presents with CP and SOB.  Is atypical,  but has  exertional component.   Awaiting cardiology consult.    Malvin Johns, MD 08/03/14 [phone]

## 2014-08-03 NOTE — ED Notes (Signed)
Patient transported to x-ray. ?

## 2014-08-03 NOTE — ED Notes (Signed)
Cards MD at bedside

## 2014-08-03 NOTE — ED Notes (Signed)
MD at bedside. 

## 2014-08-03 NOTE — ED Notes (Signed)
Pt. Stated, I started having CP and some SOB about 4 days ago.  Its off and on and i seems like it comes on when i eat or drink.

## 2014-08-03 NOTE — H&P (Signed)
Patient ID: [name] [name] MRN: [id number], DOB/AGE: Jul 18, 1940   Admit date: 08/03/2014   Primary Physician: Leonides Sake, MD Primary Cardiologist: Dr. [name]  Pt. Profile:  74 year old Caucasian male with past medical history of CAD s/p CABG and history of stent to LM, RCA, LCx, HTN, GERD, history of pancreatitis, prostate CA s/p cryoablation, hyperlipidemia, chronic LBBB, and history of carotid artery disease s/p left CEA present with worsening CP for 3 days  Problem List  Past Medical History  Diagnosis Date  . Hypertension   . Pancreatitis Dec 2011    ERCP ok.  . Hiatal hernia   . Chronic chest pain   . Left bundle branch block   . GERD (gastroesophageal reflux disease)   . Hyperlipidemia   . Statin intolerance   . Esophageal stricture   . Renal cyst     Seen on CT 08/2011 also with circumferential bladder wall thickening  . Anginal pain   . Anemia 10/06/2011  . Colon polyp     adenomatous  . Hemorrhoids   . Diverticulosis   . Atrial fibrillation   . Coronary artery disease     a. S/p CABG in 2001. b. S/p DES to protected LM and BMS to RCA 2006. c. 12/2009: s/p DES to Cx;  d. 09/2011 Cath: patent stents, patent grafts -->Med Rx.;  e. CP with abnl Nuc => LHC 10/04/12: oLM 30%, dLM stent into the CFX patent, LAD occluded, LIMA-LAD patent, RI 30%, mid AVCFX 30%, oOM1 50%, oRCA stent patent, mRCA 50-60%, Radial graft-Dx patent; EF 55%.=> Med Rx  . Carotid stenosis 04/10/11    a. s/p left carotid endarterectomy 04/14/2011.;  b.  Carotid US (11/13):  L CEA ok; RICA 1-39%  . Prostate cancer     s/p cryoablation  . Skin cancer     "cut it off my right ear"  . Chronic bronchitis     "get it q yr"  . Daily headache     "for the last couple weeks" (08/13/2013)  . Arthritis     "joints hurt; shoulders, arms, back" (08/13/2013)  . Chronic lower back pain     Past Surgical History  Procedure Laterality Date  . Cholecystectomy    . Endarterectomy  04/14/2011    Procedure:  ENDARTERECTOMY CAROTID;  Surgeon: Mal Misty, MD;  Location: [facility];  Service: Vascular;  Laterality: Left;  Would like to perform procedure first, at 0730  . Pr vein bypass graft,aorto-fem-pop    . Inguinal hernia repair Bilateral   . Cardiac catheterization    . Coronary angioplasty with stent placement      "1 + 1"  . Coronary artery bypass graft  2001  . Prostate cryoablation    . Left heart catheterization with coronary/graft angiogram N/A 10/05/2011    Procedure: LEFT HEART CATHETERIZATION WITH Beatrix Fetters;  Surgeon: Hillary Bow, MD;  Location: [facility] CATH LAB;  Service: Cardiovascular;  Laterality: N/A;  . Left heart catheterization with coronary/graft angiogram N/A 10/04/2012    Procedure: LEFT HEART CATHETERIZATION WITH Beatrix Fetters;  Surgeon: Jolaine Artist, MD;  Location: [facility] CATH LAB;  Service: Cardiovascular;  Laterality: N/A;  . Colonoscopy    . Polypectomy       Allergies  Allergies  Allergen Reactions  . Amoxicillin-Pot Clavulanate     REACTION: 'Burns' Stomach  . Codeine Nausea And Vomiting  . Erythromycin Other (See Comments)    All mycins cause upset stomach  . Hydrocodone Nausea And Vomiting  .  Morphine Nausea And Vomiting  . Nitrofuran Derivatives Other (See Comments)    unknown  . Oxycodone Hcl Nausea And Vomiting  . Shellfish Allergy Swelling  . Tramadol Nausea And Vomiting  . Zolpidem Tartrate Other (See Comments)    hallucinations    HPI  The patient is a 74 year old Caucasian male with past medical history of CAD s/p CABG and history of stent to LM, RCA, LCx, HTN, GERD, history of pancreatitis, prostate CA s/p cryoablation, hyperlipidemia, chronic LBBB, and history of carotid artery disease s/p left CEA. He has long-standing history of chronic chest pain syndrome and abdominal pain for the past several years. He never really smoked tobacco, however he continued to chew tobacco to this day. He did not tolerate statins in the  past. He was admitted in September 2014 with chest pain. Inpatient Myoview demonstrated a small area of suspected scar with partial reversibility involving the apical anteroseptal and mid anteroseptal segment. He subsequently underwent cardiac catheterization by Dr. Haroldine Laws which demonstrated continued patency of IMA to LAD, radial to diagonal, patent LMCA and CFx stents with mild narrowing of OM1, EF 55%. Cardiac catheterization showed partial dissection of left subclavian and left vertebral with good flow at the end of the case and no drop in the left arm pressure. It was felt that defect on nuclear study may be due to left bundle branch block. Patient was last admitted in the hospital on 07/2013 for chest pain, Myoview done during that admission showed a similar appearance to prior study with septal hypokinesis and suspected scar along the cardiac apex and anteroseptal wall with no inducible ischemia, EF 60%. He has not had a cardiology follow-up since. Per patient, he has been compliant with medical therapy at home. He continued to have very mild abdominal and chest discomfort chronically that does not symptom to be related to exertion. He was seen by Dr. Kellie Simmering with vascular surgery on 08/26/2013 as part as a hospital follow-up, per Dr. Kellie Simmering, he will need six-month follow-up with carotid duplex exam, however was felt that chronic abdominal discomfort is atypical and not consistent with mesenteric ischemia. He was evaluated by GI as outpatient and underwent endoscopy and colonoscopy in February 2016, colonoscopy was positive for 2 polyps and moderate diverticulosis in sigmoid colon. EGD showed distal esophageal stricture which was treated with dilatation. He was also positive for hiatal hernia.  Per patient, he has been doing well recently, he was fixing his boat motor last week without significant exertional symptom. For the past 3 days, however he has been noticing increasing shortness of breath and  substernal chest discomfort radiating to bilateral neck. The radiation to bilateral neck was concerning for him. He has been taking nitroglycerin every single morning without symptomatic relief. He states the chest pain symptoms to be worsened with both food and exertion in the last 3 days. He denies any significant lower extremity edema, orthopnea or paroxysmal nocturnal dyspnea. After taking nitroglycerin every day for the past 3 days, he finally decided to seek medical attention at [facility] on 08/03/2014. His first troponin is negative. Significant laboratory finding include creatinine of 1.2. Otherwise CBC was normal. Chest x-ray was negative for acute process. EKG showed chronic left bundle branch block, mild ST depression in V5, V6, lead 2 and aVF along with TWI. Of note, the EKG changes as been present since last EKG in January 2016, however appears to be new when compared to the previous EKG in July 2015.  Home Medications  Prior to Admission medications   Medication Sig Start Date End Date Taking? Authorizing Provider  amLODipine (NORVASC) 10 MG tablet Take 10 mg by mouth daily.   Yes Historical Provider, MD  aspirin EC 81 MG tablet Take 81 mg by mouth at bedtime.    Yes Historical Provider, MD  dicyclomine (BENTYL) 10 MG capsule Take 1 capsule (10 mg total) by mouth 4 (four) times daily -  before meals and at bedtime. 08/15/13  Yes Brett Canales, PA-C  diphenhydrAMINE (BENADRYL) 25 MG tablet Take 25 mg by mouth every 8 (eight) hours as needed for allergies.   Yes Historical Provider, MD  famotidine (PEPCID) 20 MG tablet Take 1 tablet (20 mg total) by mouth 2 (two) times daily. 08/12/13  Yes Noemi Chapel, MD  isosorbide dinitrate (ISORDIL) 20 MG tablet Take 20 mg by mouth 2 (two) times daily.   Yes Historical Provider, MD  metoprolol tartrate (LOPRESSOR) 25 MG tablet Take 25 mg by mouth 2 (two) times daily. 07/11/14  Yes Historical Provider, MD  pantoprazole (PROTONIX) 40 MG tablet Take  40 mg by mouth 2 (two) times daily.    Yes Historical Provider, MD  LORazepam (ATIVAN) 0.5 MG tablet Take one half to one tablet as needed for anxiety Patient not taking: Reported on 08/03/2014 02/08/14   Quintella Reichert, MD  metoprolol (LOPRESSOR) 50 MG tablet Take 25 mg by mouth 2 (two) times daily.     Historical Provider, MD  nitroGLYCERIN (NITROSTAT) 0.4 MG SL tablet Place 1 tablet (0.4 mg total) under the tongue every 5 (five) minutes as needed. For chest pain Patient not taking: Reported on 02/25/2014 07/21/13   Liliane Shi, PA-C    Family History  Family History  Problem Relation Age of Onset  . Heart disease Mother     Heart Disease before age 78  . Hypertension Mother   . Heart attack Mother   . Lung cancer Father   . Hypertension Sister   . Heart disease Sister     Heart Disease before age 80  . Cancer Sister   . Heart attack Sister   . Hypertension Brother   . Heart disease Brother     Heart Disease before age 79  . Heart attack Brother   . Colon cancer Neg Hx     Social History  History   Social History  . Marital Status: Married    Spouse Name: N/A  . Number of Children: 2  . Years of Education: N/A   Occupational History  . Retired    Social History Main Topics  . Smoking status: Former Smoker -- .1 years    Types: Cigarettes  . Smokeless tobacco: Current User    Types: Chew     Comment: "smoked a few cigarettes; no more than 1 month"  . Alcohol Use: No     Comment: "no alcohol since I was a teenager"  . Drug Use: No  . Sexual Activity: No   Other Topics Concern  . Not on file   Social History Narrative     Review of Systems General:  No chills, fever, night sweats or weight changes.  Cardiovascular:  No edema, orthopnea, palpitations, paroxysmal nocturnal dyspnea. +chest pain, dyspnea Dermatological: No rash, lesions/masses Respiratory: No cough Urologic: No hematuria, dysuria Abdominal:   No nausea, vomiting, diarrhea, bright red blood  per rectum, melena, or hematemesis Neurologic:  No visual changes, wkns, changes in mental status. All other systems reviewed and are otherwise negative  except as noted above.  Physical Exam  Blood pressure 140/62, pulse 57, resp. rate 11, SpO2 100 %.  General: Pleasant, NAD Psych: Normal affect. Neuro: Alert and oriented X 3. Moves all extremities spontaneously. HEENT: Normal  Neck: Supple without bruits or JVD. Lungs:  Resp regular and unlabored, CTA. Heart: RRR no s3, s4, or murmurs. Abdomen: Soft, non-tender, non-distended, BS + x 4.  Extremities: No clubbing, cyanosis or edema. DP/PT/Radials 2+ and equal bilaterally.  Labs  Troponin [facility] of Care Test)  Recent Labs  08/03/14 1352  TROPIPOC 0.01   No results for input(s): CKTOTAL, CKMB, TROPONINI in the last 72 hours. Lab Results  Component Value Date   WBC 5.3 08/03/2014   HGB 13.2 08/03/2014   HCT 38.4* 08/03/2014   MCV 85.5 08/03/2014   PLT 179 08/03/2014    Recent Labs Lab 08/03/14 1224  NA 138  K 4.9  CL 105  CO2 28  BUN 13  CREATININE 1.20  CALCIUM 9.2  GLUCOSE 103*   Lab Results  Component Value Date   CHOL 223* 10/03/2012   HDL 30* 10/03/2012   LDLCALC 150* 10/03/2012   TRIG 217* 10/03/2012   Lab Results  Component Value Date   DDIMER 0.50* 10/05/2011     Radiology/Studies  Dg Chest 2 View  08/03/2014   CLINICAL DATA:  Mid chest pain.  EXAM: CHEST  2 VIEW  COMPARISON:  02/08/2014  FINDINGS: Post CABG changes in the chest. Both lungs are clear. Heart size is normal. The trachea is midline. Negative for pleural effusions or airspace disease.  IMPRESSION: No active cardiopulmonary disease.   Electronically Signed   By: Markus Daft M.D.   On: 08/03/2014 13:27    ECG  Normal sinus rhythm with left bundle branch block, slight ST depression in V5, V6, lead 2 and aVF along with TWI. The EKG changes was the same 1 compared to last EKG in January 2016, however new when compared to the previous EKG  in July 2015.   ASSESSMENT AND PLAN  1. Progressive chest pain with both typical and atypical features  - worsen with both food and exertion, h/o hiatal hernia noted on recent EGD   - will discuss with MD regarding ischemic workup  Addendum: discussed with Dr. Harrington Challenger, will admit overnight for CP r/o, if serial trop negative, plan for discharge tomorrow with outpatient myoview  2. CAD s/p CABG and stents to LM, RCA and LCx  - last cath 10/04/2012 showed continued patency of IMA to LAD, radial to diagonal, patent LMCA and CFx stents with mild narrowing of OM1, EF 55%. Cardiac catheterization showed partial dissection of left subclavian and the left vertebral with good flow at the end of the case and no drop in the left arm pressure Last myoview 2015  No ischemia  Small fixed anteroseptal/apical defect consistent with scar EF 60%  3. HTN history of pancreatitis 4. prostate CA s/p cryoablation 5. Hyperlipidemia 6. chronic LBBB 7. carotid artery disease s/p left CEA 8. GERD/hiatal hernia  Signed, Almyra Deforest, PA-C 08/03/2014, 2:52 PM   Patient seen and examined  I agree with of H Meng.and amended note above Patient with CP that is not completely typical for angina.  Hs known CAD(last cath in Sept 2014). He also has a history of hiatal hernia and esophageal ring , sp dilation in Feb 2016  Pt notes no change in swallowing since  Occasionally food gets stuck. Pt hs a chronic discomfort in chest that is  mild   Lasts through day The past few days he has had chest discomfort (epigastrum radiating up) Sometimes with food  Sometimes with exertion.  Plan admit    R/O for mI Discussed with GI  Would recomm a Barium esophagram to eval esophageal motility.   If all neg consider [place]

## 2014-08-03 NOTE — ED Notes (Signed)
Attempted report 

## 2014-08-03 NOTE — ED Notes (Signed)
Patient transported to X-ray 

## 2014-08-04 ENCOUNTER — Observation Stay (HOSPITAL_COMMUNITY): Payer: Medicare Other

## 2014-08-04 ENCOUNTER — Other Ambulatory Visit: Payer: Self-pay | Admitting: Physician Assistant

## 2014-08-04 DIAGNOSIS — I10 Essential (primary) hypertension: Secondary | ICD-10-CM | POA: Diagnosis not present

## 2014-08-04 DIAGNOSIS — R0789 Other chest pain: Secondary | ICD-10-CM

## 2014-08-04 DIAGNOSIS — R079 Chest pain, unspecified: Secondary | ICD-10-CM | POA: Diagnosis not present

## 2014-08-04 DIAGNOSIS — R1310 Dysphagia, unspecified: Secondary | ICD-10-CM | POA: Diagnosis not present

## 2014-08-04 DIAGNOSIS — R61 Generalized hyperhidrosis: Secondary | ICD-10-CM | POA: Diagnosis not present

## 2014-08-04 DIAGNOSIS — R0602 Shortness of breath: Secondary | ICD-10-CM | POA: Diagnosis not present

## 2014-08-04 LAB — BASIC METABOLIC PANEL
Anion gap: 6 (ref 5–15)
BUN: 15 mg/dL (ref 6–20)
CO2: 25 mmol/L (ref 22–32)
Calcium: 8.7 mg/dL — ABNORMAL LOW (ref 8.9–10.3)
Chloride: 104 mmol/L (ref 101–111)
Creatinine, Ser: 1.17 mg/dL (ref 0.61–1.24)
GFR calc Af Amer: >60 mL/min (ref >60)
GFR, EST NON AFRICAN AMERICAN: 60 mL/min — AB (ref >60)
GLUCOSE: 101 mg/dL — AB (ref 65–99)
Potassium: 4.1 mmol/L (ref 3.5–5.1)
SODIUM: 135 mmol/L (ref 135–145)

## 2014-08-04 LAB — TROPONIN I: Troponin I: <0.03 ng/mL (ref <0.031)

## 2014-08-04 MED ORDER — METOPROLOL TARTRATE 25 MG PO TABS: 12.5000 mg | ORAL_TABLET | Freq: Two times a day (BID) | ORAL | Status: DC

## 2014-08-04 NOTE — Progress Notes (Signed)
Patient Name: [name] [name] Date of Encounter: 08/04/2014   SUBJECTIVE  Denies chest pain, sob or palpation. Hx of GERD, sometime feels reflux goes to his neck.   CURRENT MEDS . amLODipine  10 mg Oral Daily  . aspirin EC  81 mg Oral QHS  . dicyclomine  10 mg Oral TID AC & HS  . famotidine  20 mg Oral BID  . heparin  5,000 Units Subcutaneous 3 times per day  . isosorbide dinitrate  20 mg Oral BID  . metoprolol tartrate  25 mg Oral BID  . pantoprazole  40 mg Oral BID    OBJECTIVE  Filed Vitals:   08/03/14 1809 08/03/14 2036 08/03/14 2100 08/04/14 0500  BP: 157/57 139/64  125/52  Pulse: 57 58  58  Temp:  97.7 F (36.5 C)  98.6 F (37 C)  TempSrc:  Oral  Oral  Resp:  18  18  Height:      Weight:   134 lb 11.2 oz (61.1 kg) 132 lb 12.8 oz (60.238 kg)  SpO2: 100% 98%  98%    Intake/Output Summary (Last 24 hours) at 08/04/14 0911 Last data filed at 08/04/14 0534  Gross per 24 hour  Intake      0 ml  Output    500 ml  Net   -500 ml   Filed Weights   08/03/14 2100 08/04/14 0500  Weight: 134 lb 11.2 oz (61.1 kg) 132 lb 12.8 oz (60.238 kg)    PHYSICAL EXAM  General: Pleasant, NAD. Neuro: Alert and oriented X 3. Moves all extremities spontaneously. Psych: Normal affect. HEENT:  Normal  Neck: Supple without bruits or JVD. Lungs:  Resp regular and unlabored, CTA. Heart: RRR no s3, s4, or murmurs. Abdomen: Soft, non-tender, non-distended, BS + x 4.  Extremities: No clubbing, cyanosis or edema. DP/PT/Radials 2+ and equal bilaterally.  Accessory Clinical Findings  CBC  Recent Labs  08/03/14 1224  WBC 5.3  HGB 13.2  HCT 38.4*  MCV 85.5  PLT 570   Basic Metabolic Panel  Recent Labs  08/03/14 1224 08/04/14 0529  NA 138 135  K 4.9 4.1  CL 105 104  CO2 28 25  GLUCOSE 103* 101*  BUN 13 15  CREATININE 1.20 1.17  CALCIUM 9.2 8.7*     Recent Labs  08/03/14 1814 08/03/14 2306 08/04/14 0529  TROPONINI <0.03 <0.03 <0.03    TELE  NSR at rate of  60s. Transient bradycardia at rate of 50s.   Radiology/Studies  Dg Chest 2 View  08/03/2014   CLINICAL DATA:  Mid chest pain.  EXAM: CHEST  2 VIEW  COMPARISON:  02/08/2014  FINDINGS: Post CABG changes in the chest. Both lungs are clear. Heart size is normal. The trachea is midline. Negative for pleural effusions or airspace disease.  IMPRESSION: No active cardiopulmonary disease.   Electronically Signed   By: Markus Daft M.D.   On: 08/03/2014 13:27    ASSESSMENT AND PLAN  74 year old Caucasian male with past medical history of CAD s/p CABG and history of stent to LM, RCA, LCx, HTN, GERD, history of pancreatitis, prostate CA s/p cryoablation, hyperlipidemia, chronic LBBB, and history of carotid artery disease s/p left CEA present with worsening CP for 3 days.  1. Worsening chest pain - Both typical and atypical chest pain. No further episode. Trop X 3 negative. Repeat EKG without acute abnormality.  - Will continue to monitor - Continue ASA, Imdur, BB, amlodipine 10mg  and hepatin Philip  2. Hx of hiatal hernia and esp[hgeal ring s/p dilation 02/2014 - Pending barium esophagram - If negative, Myoview inpatient vs outpatient - Continue Pepcid 20mg  BID and protonix EC 40mg  BID  3. CAD s/p CABG and stents to LM, RCA and LCx - last cath 10/04/2012 showed continued patency of IMA to LAD, radial to diagonal, patent LMCA and CFx stents with mild narrowing of OM1, EF 55%. Cardiac catheterization showed partial dissection of left subclavian and the left vertebral with good flow at the end of the case and no drop in the left arm pressure Last myoview 2015 No ischemia Small fixed anteroseptal/apical defect consistent with scar EF 60% - Plan as above  4. HTN  - stable  5. Hyperlipidemia - intolerance to statin in past.   6. chronic LBBB - Tele with sinus bradycardia at rate of 50s. Will continue to monitor.   7. carotid artery disease s/p left CEA - Last carotid duplex in 2013. Will need outpatient  f/u.    Signed, Leanor Kail PA-C Pager [phone]  Patient seen and examined  Agree with findings of B Bhagat above   Has r/o for MI   Esophagram without evid of stricture or dysmotility Pt admits to a lot of anxiety. I would sched for outpt myoview with further outpt f/u  Dorris Carnes

## 2014-08-04 NOTE — Discharge Summary (Signed)
Discharge Summary   Patient ID: [name] [name],  MRN: [id number], DOB/AGE: 1941-01-16 74 y.o.  Admit date: 08/03/2014 Discharge date: 08/04/2014  Primary Care Provider: [facility] Primary Cardiologist: Dr. [name]  Discharge Diagnoses Active Problems:   Atypical chest pain   Allergies Allergies  Allergen Reactions  . Amoxicillin-Pot Clavulanate     REACTION: 'Burns' Stomach  . Codeine Nausea And Vomiting  . Erythromycin Other (See Comments)    All mycins cause upset stomach  . Hydrocodone Nausea And Vomiting  . Morphine Nausea And Vomiting  . Nitrofuran Derivatives Other (See Comments)    unknown  . Oxycodone Hcl Nausea And Vomiting  . Shellfish Allergy Swelling  . Tramadol Nausea And Vomiting  . Zolpidem Tartrate Other (See Comments)    hallucinations    Procedures  DG esophagus 08/04/14 FINDINGS: The esophageal motility is within normal limits. There is no evidence of stricture, mass or ulceration. Rapid sequence imaging of the pharynx in the AP and lateral projections demonstrates no mucosal abnormalities.  There is a small reducible hiatal hernia associated with a non restrictive distal esophageal ring. Mild gastroesophageal reflux was elicited with the water siphon test. At the conclusion of the study, a 13 mm barium tablet was administered. This passed without delay into the stomach.  IMPRESSION: 1. Small hiatal hernia with mild gastroesophageal reflux. 2. No evidence of recurrent stricture or mucosal ulceration. 3. Motility within normal limits.   History of Present Illness   74 year old Caucasian male with past medical history of CAD s/p CABG and history of stent to LM, RCA, LCx, HTN, GERD, history of pancreatitis, prostate CA s/p cryoablation, hyperlipidemia, chronic LBBB, and history of carotid artery disease s/p left CEA who presented 08/03/14 with worsening CP for 3 days.   He has long-standing history of chronic chest pain syndrome  and abdominal pain for the past several years. He never really smoked tobacco, however he continued to chew tobacco to this day. He did not tolerate statins in the past. He was admitted in September 2014 with chest pain. Inpatient Myoview demonstrated a small area of suspected scar with partial reversibility involving the apical anteroseptal and mid anteroseptal segment. He subsequently underwent cardiac catheterization by Dr. Haroldine Laws which demonstrated continued patency of IMA to LAD, radial to diagonal, patent LMCA and CFx stents with mild narrowing of OM1, EF 55%. Cardiac catheterization showed partial dissection of left subclavian and left vertebral with good flow at the end of the case and no drop in the left arm pressure. It was felt that defect on nuclear study may be due to left bundle branch block. Patient was last admitted in the hospital on 07/2013 for chest pain, Myoview done during that admission showed a similar appearance to prior study with septal hypokinesis and suspected scar along the cardiac apex and anteroseptal wall with no inducible ischemia, EF 60%. He has not had a cardiology follow-up since. Per patient, he has been compliant with medical therapy at home. He continued to have very mild abdominal and chest discomfort chronically that does not suppose to be related to exertion.   He was seen by Dr. Kellie Simmering with vascular surgery on 08/26/2013 as part as a hospital follow-up, per Dr. Kellie Simmering, he will need six-month follow-up with carotid duplex exam, however was felt that chronic abdominal discomfort is atypical and not consistent with mesenteric ischemia. He was evaluated by GI as outpatient and underwent endoscopy and colonoscopy in February 2016, colonoscopy was positive for 2 polyps and moderate diverticulosis  in sigmoid colon. EGD showed distal esophageal stricture which was treated with dilatation. He was also positive for hiatal hernia.  Per patient, he has been doing well recently, he  was fixing his boat motor last week without significant exertional symptom. For the past 3 days prior to admission, however he has been noticing increasing shortness of breath and substernal chest discomfort radiating to bilateral neck. The radiation to bilateral neck was concerning for him. He has been taking nitroglycerin every single morning without symptomatic relief. He states the chest pain symptoms to be worsened with both food and exertion in the last 3 days. He denies any significant lower extremity edema, orthopnea or paroxysmal nocturnal dyspnea. After taking nitroglycerin every day for the past 3 days, he finally decided to seek medical attention at [facility] on 08/03/2014. His first troponin is negative. Significant laboratory finding include creatinine of 1.2. Otherwise CBC was normal. Chest x-ray was negative for acute process. EKG showed chronic left bundle branch block, mild ST depression in V5, V6, lead 2 and aVF along with TWI. Of note, the EKG changes as been present since last EKG in January 2016, however appears to be new when compared to the previous EKG in July 2015.   Hospital Course  The patient was admitted overnight for observation. Barium esophogram showed small hiatal hernia with mild reflux, normal motility and no evidence or recurrent stricture and mucosal ulceration. He had no further episode of chest pain. Trop X 3 negative. Repeat EKG without acute abnormality. Tele showed sinus bradycardia at rate of 50s. He was discharged stably. His metoprolol decreased to 12.5mg  BID on discharge. His other home medications was continued. He was schedule outpatient Lexiscan Myoview as outpatient and f/u appointment. He is advice to call Dr. Kellie Simmering and make appointment for carotid artery disease follow up.  Discharge Vitals Blood pressure 125/52, pulse 58, temperature 98.6 F (37 C), temperature source Oral, resp. rate 18, height 5\' 4"  (1.626 m), weight 132 lb 12.8 oz (60.238  kg), SpO2 98 %.  Filed Weights   08/03/14 2100 08/04/14 0500  Weight: 134 lb 11.2 oz (61.1 kg) 132 lb 12.8 oz (60.238 kg)    Labs  CBC  Recent Labs  08/03/14 1224  WBC 5.3  HGB 13.2  HCT 38.4*  MCV 85.5  PLT 509   Basic Metabolic Panel  Recent Labs  08/03/14 1224 08/04/14 0529  NA 138 135  K 4.9 4.1  CL 105 104  CO2 28 25  GLUCOSE 103* 101*  BUN 13 15  CREATININE 1.20 1.17  CALCIUM 9.2 8.7*   Cardiac Enzymes  Recent Labs  08/03/14 1814 08/03/14 2306 08/04/14 0529  TROPONINI <0.03 <0.03 <0.03   BN Disposition  Pt is being discharged home today in good condition.  Follow-up Plans & Appointments  Follow-up Information    Follow up with [facility] On 08/18/2014.   Specialty:  Cardiology   Why:  @ 1:30 for [facility] information:   [address] [place] [phone]      Follow up with Lyda Jester, PA-C On 09/02/2014.   Specialties:  Cardiology, Radiology   Why:  @9 :30 post hospital cardiology f/u   Contact information:   [address] [place] [address] [phone]       Discharge Instructions    Call MD for:  persistant nausea and vomiting    Complete by:  As directed  Call MD for:  severe uncontrolled pain    Complete by:  As directed      Diet - low sodium heart healthy    Complete by:  As directed      Increase activity slowly    Complete by:  As directed            F/u Labs/Studies: He will need outpatient f/u with Dr. Kellie Simmering for reevaluation of carotid artery disease. Last carotid duplex in 2013. Suppose to see dr, Kellie Simmering beginning of this year, however never did.   Discharge Medications    Medication List    TAKE these medications        amLODipine 10 MG tablet  Commonly known as:  NORVASC  Take 10 mg by mouth daily.     aspirin EC 81 MG tablet  Take 81 mg by mouth at bedtime.     dicyclomine 10 MG capsule  Commonly known  as:  BENTYL  Take 1 capsule (10 mg total) by mouth 4 (four) times daily -  before meals and at bedtime.     diphenhydrAMINE 25 MG tablet  Commonly known as:  BENADRYL  Take 25 mg by mouth every 8 (eight) hours as needed for allergies.     famotidine 20 MG tablet  Commonly known as:  PEPCID  Take 1 tablet (20 mg total) by mouth 2 (two) times daily.     isosorbide dinitrate 20 MG tablet  Commonly known as:  ISORDIL  Take 20 mg by mouth 2 (two) times daily.     metoprolol tartrate 25 MG tablet  Commonly known as:  LOPRESSOR  Take 0.5 tablets (12.5 mg total) by mouth 2 (two) times daily.     nitroGLYCERIN 0.4 MG SL tablet  Commonly known as:  NITROSTAT  Place 1 tablet (0.4 mg total) under the tongue every 5 (five) minutes as needed. For chest pain     pantoprazole 40 MG tablet  Commonly known as:  PROTONIX  Take 40 mg by mouth 2 (two) times daily.        Duration of Discharge Encounter   Greater than 30 minutes including physician time.  Signed, Tesa Meadors PA-C 08/04/2014, 12:46 PM

## 2014-08-06 ENCOUNTER — Other Ambulatory Visit: Payer: Self-pay | Admitting: Physician Assistant

## 2014-08-13 ENCOUNTER — Telehealth (HOSPITAL_COMMUNITY): Payer: Self-pay

## 2014-08-13 NOTE — Telephone Encounter (Signed)
Encounter complete. 

## 2014-08-14 ENCOUNTER — Telehealth (HOSPITAL_COMMUNITY): Payer: Self-pay

## 2014-08-14 NOTE — Telephone Encounter (Signed)
Encounter complete. 

## 2014-08-17 ENCOUNTER — Telehealth (HOSPITAL_COMMUNITY): Payer: Self-pay | Admitting: *Deleted

## 2014-08-18 ENCOUNTER — Ambulatory Visit (HOSPITAL_COMMUNITY)
Admit: 2014-08-18 | Discharge: 2014-08-18 | Disposition: A | Discharge status: Home / Self Care | Payer: Medicare Other | Source: Ambulatory Visit | Attending: Physician Assistant | Admitting: Physician Assistant

## 2014-08-18 ENCOUNTER — Telehealth (HOSPITAL_COMMUNITY): Payer: Self-pay

## 2014-08-18 DIAGNOSIS — R0789 Other chest pain: Secondary | ICD-10-CM | POA: Insufficient documentation

## 2014-08-18 DIAGNOSIS — R079 Chest pain, unspecified: Secondary | ICD-10-CM

## 2014-08-18 LAB — MYOCARDIAL PERFUSION IMAGING
CHL CUP NUCLEAR SRS: 3
CHL CUP RESTING HR STRESS: 60 {beats}/min
CSEPPHR: 72 {beats}/min
LV dias vol: 107 mL
LVSYSVOL: 54 mL
SDS: 0
SSS: 3
TID: 1.03

## 2014-08-18 MED ORDER — TECHNETIUM TC 99M SESTAMIBI GENERIC - CARDIOLITE
30.6000 | Freq: Once | INTRAVENOUS | Status: AC | PRN
  Administered 2014-08-18: 31 via INTRAVENOUS

## 2014-08-18 MED ORDER — TECHNETIUM TC 99M SESTAMIBI GENERIC - CARDIOLITE
10.2000 | Freq: Once | INTRAVENOUS | Status: AC | PRN
  Administered 2014-08-18: 10 via INTRAVENOUS

## 2014-08-18 MED ORDER — AMINOPHYLLINE 25 MG/ML IV SOLN
75.0000 mg | Freq: Once | INTRAVENOUS | Status: AC
  Administered 2014-08-18: 75 mg via INTRAVENOUS

## 2014-08-18 MED ORDER — REGADENOSON 0.4 MG/5ML IV SOLN
0.4000 mg | Freq: Once | INTRAVENOUS | Status: AC
  Administered 2014-08-18: 0.4 mg via INTRAVENOUS

## 2014-08-18 NOTE — Telephone Encounter (Signed)
Encounter complete. 

## 2014-08-20 ENCOUNTER — Other Ambulatory Visit: Payer: Self-pay | Admitting: Physician Assistant

## 2014-09-02 ENCOUNTER — Ambulatory Visit: Payer: Medicare Other | Admitting: Cardiology

## 2014-09-22 ENCOUNTER — Ambulatory Visit (INDEPENDENT_AMBULATORY_CARE_PROVIDER_SITE_OTHER): Payer: Medicare Other | Admitting: Cardiology

## 2014-09-22 ENCOUNTER — Encounter: Payer: Self-pay | Admitting: Cardiology

## 2014-09-22 VITALS — BP 144/70 | HR 68 | Ht 64.5 in | Wt 137.8 lb

## 2014-09-22 DIAGNOSIS — I2510 Atherosclerotic heart disease of native coronary artery without angina pectoris: Secondary | ICD-10-CM | POA: Diagnosis not present

## 2014-09-22 DIAGNOSIS — E7800 Pure hypercholesterolemia, unspecified: Secondary | ICD-10-CM

## 2014-09-22 DIAGNOSIS — R0789 Other chest pain: Secondary | ICD-10-CM | POA: Diagnosis not present

## 2014-09-22 DIAGNOSIS — R079 Chest pain, unspecified: Secondary | ICD-10-CM

## 2014-09-22 DIAGNOSIS — I6523 Occlusion and stenosis of bilateral carotid arteries: Secondary | ICD-10-CM | POA: Diagnosis not present

## 2014-09-22 DIAGNOSIS — K219 Gastro-esophageal reflux disease without esophagitis: Secondary | ICD-10-CM | POA: Diagnosis not present

## 2014-09-22 MED ORDER — ISOSORBIDE DINITRATE 20 MG PO TABS: 20.0000 mg | ORAL_TABLET | Freq: Three times a day (TID) | ORAL | Status: DC

## 2014-09-22 NOTE — Patient Instructions (Signed)
Your physician has recommended you make the following change in your medication: INCREASE isosorbide dinitrate to THREE TIMES DAILY >> a new prescription has been sent to your pharmacy   Your physician recommends that you schedule a follow-up appointment in: 3 months with Dr. [name]  Please make an appointment with Dr. Kellie Simmering for your carotid arteries

## 2014-09-22 NOTE — Progress Notes (Signed)
Cardiology Office Note   Date:  09/22/2014   ID:  [name] [name], DOB 11-25-1940, MRN [id number]  PCP:  Leonides Sake, MD  Cardiologist:  Dr. [name]    Chief Complaint  Patient presents with  . Hospitalization Follow-up    had stress test; complains of some chest pain; occasional shortness of breath; stays dizzy all the time; occasional swelling of lower extremities      History of Present Illness: [name] [name] is a 74 y.o. male who presents for hospital follow up for chest pain.   He has a past medical history of CAD s/p CABG and history of stent to LM, RCA, LCx, HTN, GERD, history of pancreatitis, prostate CA s/p cryoablation, hyperlipidemia, chronic LBBB, and history of carotid artery disease s/p left CEA who presented 08/03/14 with worsening CP for 3 days prior to admit.   He had a barium esophogram showed small hiatal hernia with mild reflux, normal motility and no evidence or recurrent stricture and mucosal ulceration. He had no further episode of chest pain. Trop X 3 negative. Repeat EKG without acute abnormality. Tele showed sinus bradycardia at rate of 50s.   Outpatient stress test was arranged ;  Nuclear stress EF: 50%.  The left ventricular ejection fraction is mildly decreased (45-54%).  This is a low risk study.  Low risk stress nuclear study with a medium size, moderate intensity, fixed septal defect suggestive of prior infarct vs LBBB artifact; no ischemia; EF 50 with mild global hypokinesis.   LAST CATH 10/04/12: Final Conclusions:  1. Continued patency of the IMA to the LAD  2. Continued patency of the radial to the diagonal  3. Continue patency of the LMCA and CFX stents with mild narrowing of the OM1 as in 2011  4. Continued patency of the ostial RCA stent as noted.  5. Preserved overall LV function 6. Partial dissection of L subclavian and L vertebral with good flow at end of case and no drop in L arm pressure or other symptoms  Today he  continues with chest pain but he tells me he has had his current pain since his CABG.  It was more significant with his recent admit.  Now back to baseline.  His BP is still up some so meds will be adjusted.   Past Medical History  Diagnosis Date  . Hypertension   . Pancreatitis Dec 2011    ERCP ok.  . Hiatal hernia   . Chronic chest pain   . Left bundle branch block   . GERD (gastroesophageal reflux disease)   . Hyperlipidemia   . Statin intolerance   . Esophageal stricture   . Renal cyst     Seen on CT 08/2011 also with circumferential bladder wall thickening  . Anginal pain   . Anemia 10/06/2011  . Colon polyp     adenomatous  . Hemorrhoids   . Diverticulosis   . Atrial fibrillation   . Coronary artery disease     a. S/p CABG in 2001. b. S/p DES to protected LM and BMS to RCA 2006. c. 12/2009: s/p DES to Cx;  d. 09/2011 Cath: patent stents, patent grafts -->Med Rx.;  e. CP with abnl Nuc => LHC 10/04/12: oLM 30%, dLM stent into the CFX patent, LAD occluded, LIMA-LAD patent, RI 30%, mid AVCFX 30%, oOM1 50%, oRCA stent patent, mRCA 50-60%, Radial graft-Dx patent; EF 55%.=> Med Rx  . Carotid stenosis 04/10/11    a. s/p left carotid endarterectomy 04/14/2011.;  b.  Carotid US (11/13):  L CEA ok; RICA 1-39%  . Prostate cancer     s/p cryoablation  . Skin cancer     "cut it off my right ear"  . Chronic bronchitis     "get it q yr"  . Daily headache     "for the last couple weeks" (08/13/2013)  . Arthritis     "joints hurt; shoulders, arms, back" (08/13/2013)  . Chronic lower back pain     Past Surgical History  Procedure Laterality Date  . Cholecystectomy    . Endarterectomy  04/14/2011    Procedure: ENDARTERECTOMY CAROTID;  Surgeon: Mal Misty, MD;  Location: [facility];  Service: Vascular;  Laterality: Left;  Would like to perform procedure first, at 0730  . Pr vein bypass graft,aorto-fem-pop    . Inguinal hernia repair Bilateral   . Cardiac catheterization    . Coronary  angioplasty with stent placement      "1 + 1"  . Coronary artery bypass graft  2001  . Prostate cryoablation    . Left heart catheterization with coronary/graft angiogram N/A 10/05/2011    Procedure: LEFT HEART CATHETERIZATION WITH Beatrix Fetters;  Surgeon: Hillary Bow, MD;  Location: [facility] CATH LAB;  Service: Cardiovascular;  Laterality: N/A;  . Left heart catheterization with coronary/graft angiogram N/A 10/04/2012    Procedure: LEFT HEART CATHETERIZATION WITH Beatrix Fetters;  Surgeon: Jolaine Artist, MD;  Location: [facility] CATH LAB;  Service: Cardiovascular;  Laterality: N/A;  . Colonoscopy    . Polypectomy       Current Outpatient Prescriptions  Medication Sig Dispense Refill  . amLODipine (NORVASC) 10 MG tablet Take 10 mg by mouth daily.    Marland Kitchen aspirin EC 81 MG tablet Take 81 mg by mouth at bedtime.     . dicyclomine (BENTYL) 10 MG capsule Take 1 capsule (10 mg total) by mouth 4 (four) times daily -  before meals and at bedtime. 120 capsule 1  . diphenhydrAMINE (BENADRYL) 25 MG tablet Take 25 mg by mouth every 8 (eight) hours as needed for allergies.    . famotidine (PEPCID) 20 MG tablet Take 1 tablet (20 mg total) by mouth 2 (two) times daily. 30 tablet 0  . isosorbide dinitrate (ISORDIL) 20 MG tablet Take 1 tablet (20 mg total) by mouth 3 (three) times daily. 90 tablet 6  . metoprolol tartrate (LOPRESSOR) 25 MG tablet Take 0.5 tablets (12.5 mg total) by mouth 2 (two) times daily. 30 tablet 6  . nitroGLYCERIN (NITROSTAT) 0.4 MG SL tablet Place 1 tablet (0.4 mg total) under the tongue every 5 (five) minutes as needed. For chest pain 25 tablet 3  . pantoprazole (PROTONIX) 40 MG tablet Take 40 mg by mouth 2 (two) times daily.      No current facility-administered medications for this visit.    Allergies:   Amoxicillin-pot clavulanate; Codeine; Erythromycin; Hydrocodone; Morphine; Nitrofuran derivatives; Oxycodone hcl; Shellfish allergy; Tramadol; and Zolpidem tartrate      Social History:  The patient  reports that he has quit smoking. His smoking use included Cigarettes. He quit after .1 years of use. His smokeless tobacco use includes Chew. He reports that he does not drink alcohol or use illicit drugs.   Family History:  The patient's family history includes Cancer in his sister; Heart attack in his brother, mother, and sister; Heart disease in his brother, mother, and sister; Hypertension in his brother, mother, and sister; Lung cancer in his father. There is  no history of Colon cancer.    ROS:  General:no colds or fevers, no weight changes Skin:no rashes or ulcers HEENT:no blurred vision, no congestion CV:see HPI PUL:see HPI GI:no diarrhea constipation or melena, no indigestion, + abd pain, but no acute changes from his chronic GU:no hematuria, no dysuria MS:no joint pain, no claudication Neuro:no syncope, no lightheadedness Endo:no diabetes, no thyroid disease  Wt Readings from Last 3 Encounters:  09/22/14 137 lb 12.8 oz (62.506 kg)  08/18/14 143 lb (64.864 kg)  08/04/14 132 lb 12.8 oz (60.238 kg)     PHYSICAL EXAM: VS:  BP 144/70 mmHg  Pulse 68  Ht 5' 4.5" (1.638 m)  Wt 137 lb 12.8 oz (62.506 kg)  BMI 23.30 kg/m2 , BMI Body mass index is 23.3 kg/(m^2). General:Pleasant affect, NAD Skin:Warm and dry, brisk capillary refill HEENT:normocephalic, sclera clear, mucus membranes moist Neck:supple, no JVD, no bruits  Heart:S1S2 RRR without murmur, gallup, rub or click Lungs:clear without rales, rhonchi, or wheezes ZOX:WRUE, non tender, + BS, do not palpate liver spleen or masses Ext:no lower ext edema, 2+ pedal pulses, 2+ radial pulses Neuro:alert and oriented X 3, MAE, follows commands, + facial symmetry    EKG:  EKG is NOT ordered today.    Recent Labs: 02/08/2014: B Natriuretic Peptide 103.3* 08/03/2014: Hemoglobin 13.2; Platelets 179 08/04/2014: BUN 15; Creatinine, Ser 1.17; Potassium 4.1; Sodium 135    Lipid Panel    Component  Value Date/Time   CHOL 223* 10/03/2012 0543   TRIG 217* 10/03/2012 0543   HDL 30* 10/03/2012 0543   CHOLHDL 7.4 10/03/2012 0543   VLDL 43* 10/03/2012 0543   LDLCALC 150* 10/03/2012 0543   LDLDIRECT 178.8 04/02/2007 0906       Other studies Reviewed: Additional studies/ records that were reviewed today include: hospital EKG, .last cath stress test.   ASSESSMENT AND PLAN:  1.  Chest pain.  Neg MI, barium esophogram with small hiatal hernia with mild reflux and normal motility,continue with pain stomach and chest .  Will increase isordil to TID to see if improvement.  Follow up with Dr. [name] in 3 months.  If recurrent pain go to ER.  2. Hyperlipidemia on no statins will get labs from PCP.    3. HTN, with decrease in BB his BP is elevated, with increase of Isordil hopefully will decrease.  4.  Sinus brady improved with decrease of BB  5. Carotid disease, follow up with Dr. Kellie Simmering.  Current medicines are reviewed with the patient today.  The patient Has no concerns regarding medicines.  The following changes have been made:  See above Labs/ tests ordered today include:see above  Disposition:   FU:  see above  Signed, Isaiah Serge, NP  09/22/2014 3:55 PM    [facility], [place], [place], [place] Phone: [phone]; Fax: [phone]

## 2014-12-08 DIAGNOSIS — H66001 Acute suppurative otitis media without spontaneous rupture of ear drum, right ear: Secondary | ICD-10-CM | POA: Diagnosis not present

## 2014-12-08 DIAGNOSIS — J0100 Acute maxillary sinusitis, unspecified: Secondary | ICD-10-CM | POA: Diagnosis not present

## 2014-12-15 NOTE — Progress Notes (Signed)
HPI: FU CAD s/p CABG, chronic CP, carotid disease s/p L CEA, HTN. Since last seen  Studies: - LHC 10/04/12: oLM 30%, dLM stent into the CFX patent, LAD occluded, LIMA-LAD patent, RI 30%, mid AVCFX 30%, oOM1 50%, oRCA stent patent, mRCA 50-60%, Radial graft-Dx patent; EF 55%. Continued medical therapy was recommended. - Carotid US (11/13): L CEA ok; RICA 1-39%. He is followed by [facility] (Dr. Kellie Simmering). - Nuclear study July 2016 showed ejection fraction 50%. Fixed septal defect suggestive of prior infarct versus left bundle branch block but no ischemia.   Current Outpatient Prescriptions  Medication Sig Dispense Refill  . amLODipine (NORVASC) 10 MG tablet Take 10 mg by mouth daily.    Marland Kitchen aspirin EC 81 MG tablet Take 81 mg by mouth at bedtime.     . dicyclomine (BENTYL) 10 MG capsule Take 1 capsule (10 mg total) by mouth 4 (four) times daily -  before meals and at bedtime. 120 capsule 1  . diphenhydrAMINE (BENADRYL) 25 MG tablet Take 25 mg by mouth every 8 (eight) hours as needed for allergies.    . famotidine (PEPCID) 20 MG tablet Take 1 tablet (20 mg total) by mouth 2 (two) times daily. 30 tablet 0  . isosorbide dinitrate (ISORDIL) 20 MG tablet Take 1 tablet (20 mg total) by mouth 3 (three) times daily. 90 tablet 6  . metoprolol tartrate (LOPRESSOR) 25 MG tablet Take 0.5 tablets (12.5 mg total) by mouth 2 (two) times daily. 30 tablet 6  . nitroGLYCERIN (NITROSTAT) 0.4 MG SL tablet Place 1 tablet (0.4 mg total) under the tongue every 5 (five) minutes as needed. For chest pain 25 tablet 3  . pantoprazole (PROTONIX) 40 MG tablet Take 40 mg by mouth 2 (two) times daily.      No current facility-administered medications for this visit.     Past Medical History  Diagnosis Date  . Hypertension   . Pancreatitis Dec 2011    ERCP ok.  . Hiatal hernia   . Chronic chest pain   . Left bundle branch block   . GERD (gastroesophageal reflux disease)   . Hyperlipidemia   . Statin intolerance   .  Esophageal stricture   . Renal cyst     Seen on CT 08/2011 also with circumferential bladder wall thickening  . Anginal pain   . Anemia 10/06/2011  . Colon polyp     adenomatous  . Hemorrhoids   . Diverticulosis   . Atrial fibrillation   . Coronary artery disease     a. S/p CABG in 2001. b. S/p DES to protected LM and BMS to RCA 2006. c. 12/2009: s/p DES to Cx;  d. 09/2011 Cath: patent stents, patent grafts -->Med Rx.;  e. CP with abnl Nuc => LHC 10/04/12: oLM 30%, dLM stent into the CFX patent, LAD occluded, LIMA-LAD patent, RI 30%, mid AVCFX 30%, oOM1 50%, oRCA stent patent, mRCA 50-60%, Radial graft-Dx patent; EF 55%.=> Med Rx  . Carotid stenosis 04/10/11    a. s/p left carotid endarterectomy 04/14/2011.;  b.  Carotid US (11/13):  L CEA ok; RICA 1-39%  . Prostate cancer     s/p cryoablation  . Skin cancer     "cut it off my right ear"  . Chronic bronchitis     "get it q yr"  . Daily headache     "for the last couple weeks" (08/13/2013)  . Arthritis     "joints hurt; shoulders, arms, back" (08/13/2013)  .  Chronic lower back pain     Past Surgical History  Procedure Laterality Date  . Cholecystectomy    . Endarterectomy  04/14/2011    Procedure: ENDARTERECTOMY CAROTID;  Surgeon: Mal Misty, MD;  Location: [facility];  Service: Vascular;  Laterality: Left;  Would like to perform procedure first, at 0730  . Pr vein bypass graft,aorto-fem-pop    . Inguinal hernia repair Bilateral   . Cardiac catheterization    . Coronary angioplasty with stent placement      "1 + 1"  . Coronary artery bypass graft  2001  . Prostate cryoablation    . Left heart catheterization with coronary/graft angiogram N/A 10/05/2011    Procedure: LEFT HEART CATHETERIZATION WITH Beatrix Fetters;  Surgeon: Hillary Bow, MD;  Location: [facility] CATH LAB;  Service: Cardiovascular;  Laterality: N/A;  . Left heart catheterization with coronary/graft angiogram N/A 10/04/2012    Procedure: LEFT HEART CATHETERIZATION  WITH Beatrix Fetters;  Surgeon: Jolaine Artist, MD;  Location: [facility] CATH LAB;  Service: Cardiovascular;  Laterality: N/A;  . Colonoscopy    . Polypectomy      Social History   Social History  . Marital Status: Married    Spouse Name: N/A  . Number of Children: 2  . Years of Education: N/A   Occupational History  . Retired    Social History Main Topics  . Smoking status: Former Smoker -- .1 years    Types: Cigarettes  . Smokeless tobacco: Current User    Types: Chew     Comment: "smoked a few cigarettes; no more than 1 month"  . Alcohol Use: No     Comment: "no alcohol since I was a teenager"  . Drug Use: No  . Sexual Activity: No   Other Topics Concern  . Not on file   Social History Narrative    ROS: no fevers or chills, productive cough, hemoptysis, dysphasia, odynophagia, melena, hematochezia, dysuria, hematuria, rash, seizure activity, orthopnea, PND, pedal edema, claudication. Remaining systems are negative.  Physical Exam: Well-developed well-nourished in no acute distress.  Skin is warm and dry.  HEENT is normal.  Neck is supple.  Chest is clear to auscultation with normal expansion.  Cardiovascular exam is regular rate and rhythm.  Abdominal exam nontender or distended. No masses palpated. Extremities show no edema. neuro grossly intact  ECG      This encounter was created in error - please disregard.

## 2014-12-25 ENCOUNTER — Encounter: Payer: Medicare Other | Admitting: Cardiology

## 2014-12-28 DIAGNOSIS — Z23 Encounter for immunization: Secondary | ICD-10-CM | POA: Diagnosis not present

## 2015-03-09 DIAGNOSIS — Z9181 History of falling: Secondary | ICD-10-CM | POA: Diagnosis not present

## 2015-03-09 DIAGNOSIS — K219 Gastro-esophageal reflux disease without esophagitis: Secondary | ICD-10-CM | POA: Diagnosis not present

## 2015-03-09 DIAGNOSIS — I2510 Atherosclerotic heart disease of native coronary artery without angina pectoris: Secondary | ICD-10-CM | POA: Diagnosis not present

## 2015-03-09 DIAGNOSIS — Z6822 Body mass index (BMI) 22.0-22.9, adult: Secondary | ICD-10-CM | POA: Diagnosis not present

## 2015-03-09 DIAGNOSIS — Z1389 Encounter for screening for other disorder: Secondary | ICD-10-CM | POA: Diagnosis not present

## 2015-03-09 DIAGNOSIS — I10 Essential (primary) hypertension: Secondary | ICD-10-CM | POA: Diagnosis not present

## 2015-04-09 DIAGNOSIS — J019 Acute sinusitis, unspecified: Secondary | ICD-10-CM | POA: Diagnosis not present

## 2015-07-14 DIAGNOSIS — J209 Acute bronchitis, unspecified: Secondary | ICD-10-CM | POA: Diagnosis not present

## 2015-07-27 ENCOUNTER — Other Ambulatory Visit: Payer: Self-pay | Admitting: Cardiology

## 2015-08-02 DIAGNOSIS — R5381 Other malaise: Secondary | ICD-10-CM | POA: Diagnosis not present

## 2015-08-02 DIAGNOSIS — R350 Frequency of micturition: Secondary | ICD-10-CM | POA: Diagnosis not present

## 2015-08-19 ENCOUNTER — Encounter (HOSPITAL_COMMUNITY): Payer: Self-pay | Admitting: Emergency Medicine

## 2015-08-19 DIAGNOSIS — I2510 Atherosclerotic heart disease of native coronary artery without angina pectoris: Secondary | ICD-10-CM | POA: Insufficient documentation

## 2015-08-19 DIAGNOSIS — I10 Essential (primary) hypertension: Secondary | ICD-10-CM | POA: Insufficient documentation

## 2015-08-19 DIAGNOSIS — R7989 Other specified abnormal findings of blood chemistry: Secondary | ICD-10-CM | POA: Diagnosis not present

## 2015-08-19 DIAGNOSIS — Z8546 Personal history of malignant neoplasm of prostate: Secondary | ICD-10-CM | POA: Diagnosis not present

## 2015-08-19 DIAGNOSIS — Z87891 Personal history of nicotine dependence: Secondary | ICD-10-CM | POA: Diagnosis not present

## 2015-08-19 DIAGNOSIS — R0789 Other chest pain: Secondary | ICD-10-CM | POA: Diagnosis present

## 2015-08-19 DIAGNOSIS — Z7982 Long term (current) use of aspirin: Secondary | ICD-10-CM | POA: Diagnosis not present

## 2015-08-19 DIAGNOSIS — Z951 Presence of aortocoronary bypass graft: Secondary | ICD-10-CM | POA: Diagnosis not present

## 2015-08-19 DIAGNOSIS — Z85828 Personal history of other malignant neoplasm of skin: Secondary | ICD-10-CM | POA: Insufficient documentation

## 2015-08-19 DIAGNOSIS — R0602 Shortness of breath: Secondary | ICD-10-CM | POA: Diagnosis not present

## 2015-08-19 DIAGNOSIS — R079 Chest pain, unspecified: Secondary | ICD-10-CM | POA: Diagnosis not present

## 2015-08-19 DIAGNOSIS — R042 Hemoptysis: Secondary | ICD-10-CM | POA: Diagnosis not present

## 2015-08-19 NOTE — ED Triage Notes (Signed)
Patient reports central chest pain with SOB and nausea onset 2 weeks ago , took 1 tab ASA and 1 NTG sl prior to arrival with no relief. His cardiologist is Dr. [name] - history of CAD / CABG .

## 2015-08-20 ENCOUNTER — Emergency Department (HOSPITAL_COMMUNITY)
Admission: EM | Admit: 2015-08-20 | Discharge: 2015-08-20 | Disposition: A | Discharge status: Home / Self Care | Payer: Medicare Other | Attending: Emergency Medicine | Admitting: Emergency Medicine

## 2015-08-20 ENCOUNTER — Emergency Department (HOSPITAL_COMMUNITY): Payer: Medicare Other

## 2015-08-20 DIAGNOSIS — R7989 Other specified abnormal findings of blood chemistry: Secondary | ICD-10-CM

## 2015-08-20 DIAGNOSIS — R778 Other specified abnormalities of plasma proteins: Secondary | ICD-10-CM

## 2015-08-20 DIAGNOSIS — R0602 Shortness of breath: Secondary | ICD-10-CM | POA: Diagnosis not present

## 2015-08-20 DIAGNOSIS — R042 Hemoptysis: Secondary | ICD-10-CM

## 2015-08-20 DIAGNOSIS — R079 Chest pain, unspecified: Secondary | ICD-10-CM | POA: Diagnosis not present

## 2015-08-20 LAB — CBC
HCT: 40.9 % (ref 39.0–52.0)
HEMOGLOBIN: 14.1 g/dL (ref 13.0–17.0)
MCH: 30.9 pg (ref 26.0–34.0)
MCHC: 34.5 g/dL (ref 30.0–36.0)
MCV: 89.5 fL (ref 78.0–100.0)
PLATELETS: 206 10*3/uL (ref 150–400)
RBC: 4.57 MIL/uL (ref 4.22–5.81)
RDW: 13.4 % (ref 11.5–15.5)
WBC: 6 10*3/uL (ref 4.0–10.5)

## 2015-08-20 LAB — BASIC METABOLIC PANEL
ANION GAP: 6 (ref 5–15)
BUN: 18 mg/dL (ref 6–20)
CALCIUM: 8.9 mg/dL (ref 8.9–10.3)
CO2: 26 mmol/L (ref 22–32)
Chloride: 107 mmol/L (ref 101–111)
Creatinine, Ser: 1.21 mg/dL (ref 0.61–1.24)
GFR calc Af Amer: >60 mL/min (ref >60)
GFR, EST NON AFRICAN AMERICAN: 57 mL/min — AB (ref >60)
GLUCOSE: 116 mg/dL — AB (ref 65–99)
Potassium: 4.2 mmol/L (ref 3.5–5.1)
Sodium: 139 mmol/L (ref 135–145)

## 2015-08-20 LAB — I-STAT TROPONIN, ED
TROPONIN I, POC: 0 ng/mL (ref 0.00–0.08)
Troponin i, poc: 0 ng/mL (ref 0.00–0.08)
Troponin i, poc: 0.01 ng/mL (ref 0.00–0.08)

## 2015-08-20 LAB — PROTIME-INR
INR: 0.92
PROTHROMBIN TIME: 12.3 s (ref 11.4–15.2)

## 2015-08-20 MED ORDER — ASPIRIN 81 MG PO CHEW
324.0000 mg | CHEWABLE_TABLET | Freq: Once | ORAL | Status: AC
  Administered 2015-08-20: 324 mg via ORAL
  Filled 2015-08-20: qty 4

## 2015-08-20 NOTE — ED Provider Notes (Addendum)
[facility] DEPT Provider Note   CSN: [id number] Arrival date & time: 08/19/15  2345  First Provider Contact:  First MD Initiated Contact with Patient 08/20/15 0121     By signing my name below, I, Julien Nordmann, attest that this documentation has been prepared under the direction and in the presence of Delora Fuel, MD.  Electronically Signed: Julien Nordmann, ED Scribe. 08/20/15. 1:43 AM.    History   Chief Complaint Chief Complaint  Patient presents with  . Chest Pain    The history is provided by the patient and a relative. No language interpreter was used.   HPI Comments: [name] [name] is a 75 y.o. male who has a PMHx of HTN, a-fib, left bundle block, GERD, HLD, CABG, CAD, carotid stenosis, and prostate cancer presents to the Emergency Department complaining of waxing and waning, gradual worsening, 8/10 pressure-like central chest pain that radiates into his left arm onset two weeks ago with worsening today. He reports associated nausea, shortness of breath, leg cramps and jaw pain when he opens his mouth. Pt says his pain worsens with extreme exertion such as running. He reports that he leg cramps have awoken him out of his sleep but his chest pain has not.  Pt says he was seen by UC last week and was told that his potassium was elevated. He notes taking one aspirin and one nitro to alleviate his symptoms with no relief. He denies diaphoresis or vomiting.  Cardiologist is Dr. [name]  Past Medical History:  Diagnosis Date  . Anemia 10/06/2011  . Anginal pain ([place])   . Arthritis    "joints hurt; shoulders, arms, back" (08/13/2013)  . Atrial fibrillation ([place])   . Carotid stenosis 04/10/11   a. s/p left carotid endarterectomy 04/14/2011.;  b.  Carotid US (11/13):  L CEA ok; RICA 1-39%  . Chronic bronchitis ([place])    "get it q yr"  . Chronic chest pain   . Chronic lower back pain   . Colon polyp    adenomatous  . Coronary artery disease    a. S/p CABG in 2001. b. S/p DES to  protected LM and BMS to RCA 2006. c. 12/2009: s/p DES to Cx;  d. 09/2011 Cath: patent stents, patent grafts -->Med Rx.;  e. CP with abnl Nuc => LHC 10/04/12: oLM 30%, dLM stent into the CFX patent, LAD occluded, LIMA-LAD patent, RI 30%, mid AVCFX 30%, oOM1 50%, oRCA stent patent, mRCA 50-60%, Radial graft-Dx patent; EF 55%.=> Med Rx  . Daily headache    "for the last couple weeks" (08/13/2013)  . Diverticulosis   . Esophageal stricture   . GERD (gastroesophageal reflux disease)   . Hemorrhoids   . Hiatal hernia   . Hyperlipidemia   . Hypertension   . Left bundle branch block   . Pancreatitis Dec 2011   ERCP ok.  . Prostate cancer [facility])    s/p cryoablation  . Renal cyst    Seen on CT 08/2011 also with circumferential bladder wall thickening  . Skin cancer    "cut it off my right ear"  . Statin intolerance     Patient Active Problem List   Diagnosis Date Noted  . Atypical chest pain 08/03/2014  . Abdominal pain, chronic, epigastric 08/26/2013  . Abdominal pain, lower 08/15/2013  . CAD (coronary artery disease) 10/05/2012  . Unstable angina ([place]) 10/06/2011  . Anemia 10/06/2011  . Memory loss 04/07/2011  . ANXIETY 02/01/2010  . ABDOMINAL PAIN-EPIGASTRIC 02/01/2010  .  ADENOCARCINOMA, PROSTATE 12/22/2008  . DYSPHAGIA UNSPECIFIED 11/18/2008  . COLONIC POLYPS, HX OF 11/18/2008  . HYPERCHOLESTEROLEMIA, PURE 01/20/2008  . HYPERTENSION, BENIGN 01/20/2008  . CAD, ARTERY BYPASS GRAFT 01/20/2008  . LBBB 01/20/2008  . Carotid stenosis 01/20/2008  . GERD 01/20/2008  . CHEST PAIN, NON-CARDIAC 01/20/2008    Past Surgical History:  Procedure Laterality Date  . CARDIAC CATHETERIZATION    . CHOLECYSTECTOMY    . COLONOSCOPY    . CORONARY ANGIOPLASTY WITH STENT PLACEMENT     "1 + 1"  . CORONARY ARTERY BYPASS GRAFT  2001  . ENDARTERECTOMY  04/14/2011   Procedure: ENDARTERECTOMY CAROTID;  Surgeon: Mal Misty, MD;  Location: [facility];  Service: Vascular;  Laterality: Left;  Would like to  perform procedure first, at 0730  . INGUINAL HERNIA REPAIR Bilateral   . LEFT HEART CATHETERIZATION WITH CORONARY/GRAFT ANGIOGRAM N/A 10/05/2011   Procedure: LEFT HEART CATHETERIZATION WITH Beatrix Fetters;  Surgeon: Hillary Bow, MD;  Location: [facility] CATH LAB;  Service: Cardiovascular;  Laterality: N/A;  . LEFT HEART CATHETERIZATION WITH CORONARY/GRAFT ANGIOGRAM N/A 10/04/2012   Procedure: LEFT HEART CATHETERIZATION WITH Beatrix Fetters;  Surgeon: Jolaine Artist, MD;  Location: [facility] CATH LAB;  Service: Cardiovascular;  Laterality: N/A;  . POLYPECTOMY    . PR VEIN BYPASS GRAFT,AORTO-FEM-POP    . PROSTATE CRYOABLATION         Home Medications    Prior to Admission medications   Medication Sig Start Date End Date Taking? Authorizing Provider  amLODipine (NORVASC) 10 MG tablet Take 10 mg by mouth daily.   Yes Historical Provider, MD  aspirin EC 81 MG tablet Take 81 mg by mouth at bedtime.    Yes Historical Provider, MD  diphenhydrAMINE (BENADRYL) 25 MG tablet Take 25 mg by mouth every 8 (eight) hours as needed for allergies.   Yes Historical Provider, MD  isosorbide dinitrate (ISORDIL) 20 MG tablet Take 20 mg by mouth 2 (two) times daily.   Yes Historical Provider, MD  metoprolol tartrate (LOPRESSOR) 25 MG tablet Take 0.5 tablets (12.5 mg total) by mouth 2 (two) times daily. Patient taking differently: Take 25 mg by mouth 2 (two) times daily.  08/04/14  Yes Bhavinkumar Bhagat, PA  nitroGLYCERIN (NITROSTAT) 0.4 MG SL tablet Place 1 tablet (0.4 mg total) under the tongue every 5 (five) minutes as needed. For chest pain 07/21/13  Yes Scott Joylene Draft, PA-C  pantoprazole (PROTONIX) 40 MG tablet Take 40 mg by mouth 2 (two) times daily.    Yes Historical Provider, MD  dicyclomine (BENTYL) 10 MG capsule Take 1 capsule (10 mg total) by mouth 4 (four) times daily -  before meals and at bedtime. Patient not taking: Reported on 08/20/2015 08/15/13   Brett Canales, PA-C  famotidine (PEPCID)  20 MG tablet Take 1 tablet (20 mg total) by mouth 2 (two) times daily. Patient not taking: Reported on 08/20/2015 08/12/13   Noemi Chapel, MD  isosorbide dinitrate (ISORDIL) 20 MG tablet TAKE 1 TABLET (20 MG TOTAL) BY MOUTH 3 (THREE) TIMES DAILY. Patient not taking: Reported on 08/20/2015 07/28/15   Isaiah Serge, NP    Family History Family History  Problem Relation Age of Onset  . Heart disease Mother     Heart Disease before age 47  . Hypertension Mother   . Heart attack Mother   . Lung cancer Father   . Hypertension Sister   . Heart disease Sister     Heart Disease before age 79  .  Cancer Sister   . Heart attack Sister   . Hypertension Brother   . Heart disease Brother     Heart Disease before age 58  . Heart attack Brother   . Colon cancer Neg Hx     Social History Social History  Substance Use Topics  . Smoking status: Former Smoker    Years: 0.10    Types: Cigarettes  . Smokeless tobacco: Current User    Types: Chew     Comment: "smoked a few cigarettes; no more than 1 month"  . Alcohol use No     Comment: "no alcohol since I was a teenager"     Allergies   Amoxicillin-pot clavulanate; Codeine; Erythromycin; Hydrocodone; Morphine; Nitrofuran derivatives; Oxycodone hcl; Shellfish allergy; Tramadol; and Zolpidem tartrate   Review of Systems Review of Systems  Constitutional: Negative for diaphoresis and fever.  Eyes: Positive for discharge.  Respiratory: Positive for shortness of breath.   Cardiovascular: Positive for chest pain.  Gastrointestinal: Positive for nausea. Negative for vomiting.  All other systems reviewed and are negative.    Physical Exam Updated Vital Signs BP 137/71 (BP Location: Right Arm)   Pulse 67   Temp 98.7 F (37.1 C) (Oral)   Resp 18   Ht 5\' 4"  (1.626 m)   Wt 138 lb (62.6 kg)   SpO2 99%   BMI 23.69 kg/m   Physical Exam  Constitutional: He is oriented to person, place, and time. He appears well-developed and well-nourished.   HENT:  Head: Normocephalic and atraumatic.  Eyes: EOM are normal. Pupils are equal, round, and reactive to light.  Neck: Normal range of motion. No JVD present.  Cardiovascular: Normal rate, regular rhythm, normal heart sounds and intact distal pulses.   No murmur heard. Pulmonary/Chest: Effort normal and breath sounds normal. He has no wheezes. He has no rales. He exhibits no tenderness.  Abdominal: Soft. He exhibits no distension and no mass. There is no tenderness.  Musculoskeletal: Normal range of motion. He exhibits no edema.  Lymphadenopathy:    He has no cervical adenopathy.  Neurological: He is alert and oriented to person, place, and time. No cranial nerve deficit. He exhibits normal muscle tone. Coordination normal.  Skin: Skin is warm and dry. No rash noted.  Psychiatric: He has a normal mood and affect. His behavior is normal. Judgment and thought content normal.  Nursing note and vitals reviewed.    ED Treatments / Results  DIAGNOSTIC STUDIES: Oxygen Saturation is 99% on RA, normal by my interpretation.  COORDINATION OF CARE:  1:39 AM Discussed treatment plan with pt at bedside and pt agreed to plan.  Labs (all labs ordered are listed, but only abnormal results are displayed) Labs Reviewed  BASIC METABOLIC PANEL - Abnormal; Notable for the following:       Result Value   Glucose, Bld 116 (*)    GFR calc non Af Amer 57 (*)    All other components within normal limits  CBC  PROTIME-INR  I-STAT TROPOININ, ED  I-STAT TROPOININ, ED  I-STAT TROPOININ, ED    EKG  EKG Interpretation  Date/Time:  Thursday August 19 2015 23:48:12 EDT Ventricular Rate:  66 PR Interval:  142 QRS Duration: 142 QT Interval:  432 QTC Calculation: 452 R Axis:   -50 Text Interpretation:  Normal sinus rhythm Left axis deviation Left bundle branch block Abnormal ECG When compared with ECG of 08/04/2014, No significant change was found Confirmed by [facility]  MD, Ioane Bhola ([id number]) on 08/20/2015  1:19:45 AM       Radiology Dg Chest 2 View  Result Date: 08/20/2015 CLINICAL DATA:  75 year old male with left-sided chest pain and shortness of breath EXAM: CHEST  2 VIEW COMPARISON:  Chest radiograph dated 08/03/2014 FINDINGS: Two views of the chest do not demonstrate a focal consolidation. There is no pleural effusion or pneumothorax. The cardiac silhouette is within normal limits. The aorta is tortuous. Median sternotomy wires and CABG vascular clips noted. No acute osseous pathology. IMPRESSION: No active cardiopulmonary disease. Electronically Signed   By: Anner Crete M.D.   On: 08/20/2015 00:32   Procedures Procedures (including critical care time)  Medications Ordered in ED Medications  aspirin chewable tablet 324 mg (not administered)     Initial Impression / Assessment and Plan / ED Course  I have reviewed the triage vital signs and the nursing notes.  Pertinent labs & imaging results that were available during my care of the patient were reviewed by me and considered in my medical decision making (see chart for details).  Clinical Course  Comment By Time  Borderline hyperglycemia Delora Fuel, MD [id number]    Chest pain which seems very atypical. Old records were reviewed and he does have known coronary disease with catheterization in 2014 showing patent stents and patent bypass graft. Myocardial perfusion scan in 2016 was low risk. ECG is unremarkable. Initial troponin is normal. He is kept in the ED for a repeat troponin which has increased modestly (from 0.00 to 0.01. He will be kept for a third troponin.  Third troponin is back down to 0.00. He is discharged and instructed to contact his cardiologist to consider repeat stress testing.  Final Clinical Impressions(s) / ED Diagnoses   Final diagnoses:  Nonspecific chest pain   I personally performed the services described in this documentation, which was scribed in my presence. The recorded information has been  reviewed and is accurate.    New Prescriptions New Prescriptions   No medications on file     Delora Fuel, MD [id number] [phone]    Delora Fuel, MD [id number]

## 2015-08-24 DIAGNOSIS — G4762 Sleep related leg cramps: Secondary | ICD-10-CM | POA: Diagnosis not present

## 2015-08-24 DIAGNOSIS — Z6823 Body mass index (BMI) 23.0-23.9, adult: Secondary | ICD-10-CM | POA: Diagnosis not present

## 2015-08-24 DIAGNOSIS — I2510 Atherosclerotic heart disease of native coronary artery without angina pectoris: Secondary | ICD-10-CM | POA: Diagnosis not present

## 2015-08-24 DIAGNOSIS — F419 Anxiety disorder, unspecified: Secondary | ICD-10-CM | POA: Diagnosis not present

## 2015-09-05 ENCOUNTER — Encounter (HOSPITAL_COMMUNITY): Payer: Self-pay | Admitting: Emergency Medicine

## 2015-09-05 ENCOUNTER — Emergency Department (HOSPITAL_COMMUNITY)
Admission: EM | Admit: 2015-09-05 | Discharge: 2015-09-05 | Disposition: A | Discharge status: Home / Self Care | Payer: Medicare Other | Attending: Emergency Medicine | Admitting: Emergency Medicine

## 2015-09-05 DIAGNOSIS — R42 Dizziness and giddiness: Secondary | ICD-10-CM | POA: Insufficient documentation

## 2015-09-05 DIAGNOSIS — Z955 Presence of coronary angioplasty implant and graft: Secondary | ICD-10-CM | POA: Insufficient documentation

## 2015-09-05 DIAGNOSIS — Z951 Presence of aortocoronary bypass graft: Secondary | ICD-10-CM | POA: Diagnosis not present

## 2015-09-05 DIAGNOSIS — I2510 Atherosclerotic heart disease of native coronary artery without angina pectoris: Secondary | ICD-10-CM | POA: Diagnosis not present

## 2015-09-05 DIAGNOSIS — I10 Essential (primary) hypertension: Secondary | ICD-10-CM | POA: Insufficient documentation

## 2015-09-05 DIAGNOSIS — Z8546 Personal history of malignant neoplasm of prostate: Secondary | ICD-10-CM | POA: Insufficient documentation

## 2015-09-05 DIAGNOSIS — Z85828 Personal history of other malignant neoplasm of skin: Secondary | ICD-10-CM | POA: Diagnosis not present

## 2015-09-05 DIAGNOSIS — Z87891 Personal history of nicotine dependence: Secondary | ICD-10-CM | POA: Diagnosis not present

## 2015-09-05 DIAGNOSIS — Z79899 Other long term (current) drug therapy: Secondary | ICD-10-CM | POA: Insufficient documentation

## 2015-09-05 DIAGNOSIS — Z7982 Long term (current) use of aspirin: Secondary | ICD-10-CM | POA: Insufficient documentation

## 2015-09-05 LAB — URINALYSIS, ROUTINE W REFLEX MICROSCOPIC
BILIRUBIN URINE: NEGATIVE
Glucose, UA: NEGATIVE mg/dL
Hgb urine dipstick: NEGATIVE
KETONES UR: NEGATIVE mg/dL
Leukocytes, UA: NEGATIVE
NITRITE: NEGATIVE
PH: 5 (ref 5.0–8.0)
PROTEIN: NEGATIVE mg/dL
Specific Gravity, Urine: 1.019 (ref 1.005–1.030)

## 2015-09-05 LAB — BASIC METABOLIC PANEL
ANION GAP: 5 (ref 5–15)
BUN: 16 mg/dL (ref 6–20)
CALCIUM: 9.1 mg/dL (ref 8.9–10.3)
CO2: 25 mmol/L (ref 22–32)
CREATININE: 1.32 mg/dL — AB (ref 0.61–1.24)
Chloride: 105 mmol/L (ref 101–111)
GFR calc Af Amer: 59 mL/min — ABNORMAL LOW (ref >60)
GFR, EST NON AFRICAN AMERICAN: 51 mL/min — AB (ref >60)
GLUCOSE: 116 mg/dL — AB (ref 65–99)
Potassium: 4.5 mmol/L (ref 3.5–5.1)
Sodium: 135 mmol/L (ref 135–145)

## 2015-09-05 LAB — CBG MONITORING, ED: GLUCOSE-CAPILLARY: 103 mg/dL — AB (ref 65–99)

## 2015-09-05 LAB — CBC
HCT: 42.4 % (ref 39.0–52.0)
Hemoglobin: 14.5 g/dL (ref 13.0–17.0)
MCH: 30.5 pg (ref 26.0–34.0)
MCHC: 34.2 g/dL (ref 30.0–36.0)
MCV: 89.3 fL (ref 78.0–100.0)
PLATELETS: 235 10*3/uL (ref 150–400)
RBC: 4.75 MIL/uL (ref 4.22–5.81)
RDW: 13.3 % (ref 11.5–15.5)
WBC: 5.8 10*3/uL (ref 4.0–10.5)

## 2015-09-05 MED ORDER — ACETAMINOPHEN 325 MG PO TABS
650.0000 mg | ORAL_TABLET | Freq: Once | ORAL | Status: AC
  Administered 2015-09-05: 650 mg via ORAL
  Filled 2015-09-05: qty 2

## 2015-09-05 MED ORDER — MECLIZINE HCL 12.5 MG PO TABS: 12.5000 mg | ORAL_TABLET | Freq: Three times a day (TID) | ORAL | 0 refills | Status: DC | PRN

## 2015-09-05 MED ORDER — MECLIZINE HCL 25 MG PO TABS
25.0000 mg | ORAL_TABLET | Freq: Once | ORAL | Status: AC
  Administered 2015-09-05: 25 mg via ORAL
  Filled 2015-09-05: qty 1

## 2015-09-05 NOTE — ED Triage Notes (Addendum)
Dizziness for a "long time." Seen for the same two weeks ago. Dizziness has continued and two days ago onset of headache and neck pain on right side only. Moving head makes the dizziness worse. No extremity weakness, no facial droop, speech intact.

## 2015-09-05 NOTE — ED Provider Notes (Signed)
[facility] DEPT Provider Note   CSN: [id number] Arrival date & time: 09/05/15  1112  First Provider Contact:  None       History   Chief Complaint Chief Complaint  Patient presents with  . Dizziness    HPI [name] [name] is a 75 y.o. male.  75 year old male with a long-standing history of vertigo presents with worsening vertiginous symptoms. States that feels as if the room is spinning when he moves his head certain directions. He's had nausea but no vomiting. Has had a mild right sided headache without visual changes. No fever or chills. Denies any focal weakness in his extremities. Symptoms better with remaining still. Denies any recent history of volume loss. No treatment used for this prior to arrival      Past Medical History:  Diagnosis Date  . Anemia 10/06/2011  . Anginal pain ([place])   . Arthritis    "joints hurt; shoulders, arms, back" (08/13/2013)  . Atrial fibrillation ([place])   . Carotid stenosis 04/10/11   a. s/p left carotid endarterectomy 04/14/2011.;  b.  Carotid US (11/13):  L CEA ok; RICA 1-39%  . Chronic bronchitis ([place])    "get it q yr"  . Chronic chest pain   . Chronic lower back pain   . Colon polyp    adenomatous  . Coronary artery disease    a. S/p CABG in 2001. b. S/p DES to protected LM and BMS to RCA 2006. c. 12/2009: s/p DES to Cx;  d. 09/2011 Cath: patent stents, patent grafts -->Med Rx.;  e. CP with abnl Nuc => LHC 10/04/12: oLM 30%, dLM stent into the CFX patent, LAD occluded, LIMA-LAD patent, RI 30%, mid AVCFX 30%, oOM1 50%, oRCA stent patent, mRCA 50-60%, Radial graft-Dx patent; EF 55%.=> Med Rx  . Daily headache    "for the last couple weeks" (08/13/2013)  . Diverticulosis   . Esophageal stricture   . GERD (gastroesophageal reflux disease)   . Hemorrhoids   . Hiatal hernia   . Hyperlipidemia   . Hypertension   . Left bundle branch block   . Pancreatitis Dec 2011   ERCP ok.  . Prostate cancer [facility])    s/p cryoablation  . Renal cyst      Seen on CT 08/2011 also with circumferential bladder wall thickening  . Skin cancer    "cut it off my right ear"  . Statin intolerance     Patient Active Problem List   Diagnosis Date Noted  . Atypical chest pain 08/03/2014  . Abdominal pain, chronic, epigastric 08/26/2013  . Abdominal pain, lower 08/15/2013  . CAD (coronary artery disease) 10/05/2012  . Unstable angina ([place]) 10/06/2011  . Anemia 10/06/2011  . Memory loss 04/07/2011  . ANXIETY 02/01/2010  . ABDOMINAL PAIN-EPIGASTRIC 02/01/2010  . ADENOCARCINOMA, PROSTATE 12/22/2008  . DYSPHAGIA UNSPECIFIED 11/18/2008  . COLONIC POLYPS, HX OF 11/18/2008  . HYPERCHOLESTEROLEMIA, PURE 01/20/2008  . HYPERTENSION, BENIGN 01/20/2008  . CAD, ARTERY BYPASS GRAFT 01/20/2008  . LBBB 01/20/2008  . Carotid stenosis 01/20/2008  . GERD 01/20/2008  . CHEST PAIN, NON-CARDIAC 01/20/2008    Past Surgical History:  Procedure Laterality Date  . CARDIAC CATHETERIZATION    . CHOLECYSTECTOMY    . COLONOSCOPY    . CORONARY ANGIOPLASTY WITH STENT PLACEMENT     "1 + 1"  . CORONARY ARTERY BYPASS GRAFT  2001  . ENDARTERECTOMY  04/14/2011   Procedure: ENDARTERECTOMY CAROTID;  Surgeon: Mal Misty, MD;  Location: [facility];  Service: Vascular;  Laterality: Left;  Would like to perform procedure first, at 0730  . INGUINAL HERNIA REPAIR Bilateral   . LEFT HEART CATHETERIZATION WITH CORONARY/GRAFT ANGIOGRAM N/A 10/05/2011   Procedure: LEFT HEART CATHETERIZATION WITH Beatrix Fetters;  Surgeon: Hillary Bow, MD;  Location[facility] CATH LAB;  Service: Cardiovascular;  Laterality: N/A;  . LEFT HEART CATHETERIZATION WITH CORONARY/GRAFT ANGIOGRAM N/A 10/04/2012   Procedure: LEFT HEART CATHETERIZATION WITH Beatrix Fetters;  Surgeon: Jolaine Artist, MD;  Location: [facility] CATH LAB;  Service: Cardiovascular;  Laterality: N/A;  . POLYPECTOMY    . PR VEIN BYPASS GRAFT,AORTO-FEM-POP    . PROSTATE CRYOABLATION         Home Medications    Prior  to Admission medications   Medication Sig Start Date End Date Taking? Authorizing Provider  amLODipine (NORVASC) 10 MG tablet Take 10 mg by mouth daily.    Historical Provider, MD  aspirin EC 81 MG tablet Take 81 mg by mouth at bedtime.     Historical Provider, MD  diphenhydrAMINE (BENADRYL) 25 MG tablet Take 25 mg by mouth every 8 (eight) hours as needed for allergies.    Historical Provider, MD  isosorbide dinitrate (ISORDIL) 20 MG tablet Take 20 mg by mouth 2 (two) times daily.    Historical Provider, MD  metoprolol tartrate (LOPRESSOR) 25 MG tablet Take 0.5 tablets (12.5 mg total) by mouth 2 (two) times daily. Patient taking differently: Take 25 mg by mouth 2 (two) times daily.  08/04/14   Bhavinkumar Bhagat, PA  nitroGLYCERIN (NITROSTAT) 0.4 MG SL tablet Place 1 tablet (0.4 mg total) under the tongue every 5 (five) minutes as needed. For chest pain 07/21/13   Liliane Shi, PA-C  pantoprazole (PROTONIX) 40 MG tablet Take 40 mg by mouth 2 (two) times daily.     Historical Provider, MD    Family History Family History  Problem Relation Age of Onset  . Heart disease Mother     Heart Disease before age 37  . Hypertension Mother   . Heart attack Mother   . Lung cancer Father   . Hypertension Sister   . Heart disease Sister     Heart Disease before age 85  . Cancer Sister   . Heart attack Sister   . Hypertension Brother   . Heart disease Brother     Heart Disease before age 71  . Heart attack Brother   . Colon cancer Neg Hx     Social History Social History  Substance Use Topics  . Smoking status: Former Smoker    Years: 0.10    Types: Cigarettes  . Smokeless tobacco: Current User    Types: Chew     Comment: "smoked a few cigarettes; no more than 1 month"  . Alcohol use No     Comment: "no alcohol since I was a teenager"     Allergies   Amoxicillin-pot clavulanate; Codeine; Erythromycin; Hydrocodone; Morphine; Nitrofuran derivatives; Oxycodone hcl; Shellfish allergy;  Tramadol; and Zolpidem tartrate   Review of Systems Review of Systems  All other systems reviewed and are negative.    Physical Exam Updated Vital Signs BP 119/65 (BP Location: Right Arm)   Pulse 61   Temp 97.4 F (36.3 C) (Oral)   Resp 13   SpO2 98%   Physical Exam  Constitutional: He is oriented to person, place, and time. He appears well-developed and well-nourished.  Non-toxic appearance. No distress.  HENT:  Head: Normocephalic and atraumatic.  Eyes: Conjunctivae, EOM  and lids are normal. Pupils are equal, round, and reactive to light.  Neck: Normal range of motion. Neck supple. No tracheal deviation present. No thyroid mass present.  Cardiovascular: Normal rate, regular rhythm and normal heart sounds.  Exam reveals no gallop.   No murmur heard. Pulmonary/Chest: Effort normal and breath sounds normal. No stridor. No respiratory distress. He has no decreased breath sounds. He has no wheezes. He has no rhonchi. He has no rales.  Abdominal: Soft. Normal appearance and bowel sounds are normal. He exhibits no distension. There is no tenderness. There is no rebound and no CVA tenderness.  Musculoskeletal: Normal range of motion. He exhibits no edema or tenderness.  Neurological: He is alert and oriented to person, place, and time. He has normal strength. He displays no tremor. No cranial nerve deficit or sensory deficit. He displays a negative Romberg sign. GCS eye subscore is 4. GCS verbal subscore is 5. GCS motor subscore is 6.  Positive horizontal nystagmus noted  Skin: Skin is warm and dry. No abrasion and no rash noted.  Psychiatric: He has a normal mood and affect. His speech is normal and behavior is normal.  Nursing note and vitals reviewed.    ED Treatments / Results  Labs (all labs ordered are listed, but only abnormal results are displayed) Labs Reviewed  BASIC METABOLIC PANEL - Abnormal; Notable for the following:       Result Value   Glucose, Bld 116 (*)     Creatinine, Ser 1.32 (*)    GFR calc non Af Amer 51 (*)    GFR calc Af Amer 59 (*)    All other components within normal limits  CBG MONITORING, ED - Abnormal; Notable for the following:    Glucose-Capillary 103 (*)    All other components within normal limits  CBC  URINALYSIS, ROUTINE W REFLEX MICROSCOPIC (NOT AT [facility])    EKG  EKG Interpretation None       Radiology No results found.  Procedures Procedures (including critical care time)  Medications Ordered in ED Medications  meclizine (ANTIVERT) tablet 25 mg (not administered)  acetaminophen (TYLENOL) tablet 650 mg (not administered)     Initial Impression / Assessment and Plan / ED Course  I have reviewed the triage vital signs and the nursing notes.  Pertinent labs & imaging results that were available during my care of the patient were reviewed by me and considered in my medical decision making (see chart for details).  Clinical Course    Patient treated with Antivert here feels better. Patient ablated without difficulty. No ataxia. Gait is stable. Symptoms today likely from vertigo and will be prescribed Antivert and discharged home in stable condition  Final Clinical Impressions(s) / ED Diagnoses   Final diagnoses:  None    New Prescriptions New Prescriptions   No medications on file     Lacretia Leigh, MD 09/05/15 1357

## 2015-09-05 NOTE — ED Notes (Signed)
Granddaughter at bedside

## 2015-09-08 DIAGNOSIS — Z6823 Body mass index (BMI) 23.0-23.9, adult: Secondary | ICD-10-CM | POA: Diagnosis not present

## 2015-09-08 DIAGNOSIS — I2510 Atherosclerotic heart disease of native coronary artery without angina pectoris: Secondary | ICD-10-CM | POA: Diagnosis not present

## 2015-09-08 DIAGNOSIS — Z79899 Other long term (current) drug therapy: Secondary | ICD-10-CM | POA: Diagnosis not present

## 2015-09-08 DIAGNOSIS — K219 Gastro-esophageal reflux disease without esophagitis: Secondary | ICD-10-CM | POA: Diagnosis not present

## 2015-09-08 DIAGNOSIS — Z125 Encounter for screening for malignant neoplasm of prostate: Secondary | ICD-10-CM | POA: Diagnosis not present

## 2015-09-08 DIAGNOSIS — E7800 Pure hypercholesterolemia, unspecified: Secondary | ICD-10-CM | POA: Diagnosis not present

## 2015-09-08 DIAGNOSIS — I10 Essential (primary) hypertension: Secondary | ICD-10-CM | POA: Diagnosis not present

## 2015-10-21 DIAGNOSIS — F419 Anxiety disorder, unspecified: Secondary | ICD-10-CM | POA: Diagnosis not present

## 2015-10-21 DIAGNOSIS — R42 Dizziness and giddiness: Secondary | ICD-10-CM | POA: Diagnosis not present

## 2015-10-21 DIAGNOSIS — Z23 Encounter for immunization: Secondary | ICD-10-CM | POA: Diagnosis not present

## 2015-10-21 DIAGNOSIS — Z6823 Body mass index (BMI) 23.0-23.9, adult: Secondary | ICD-10-CM | POA: Diagnosis not present

## 2015-10-27 ENCOUNTER — Encounter (HOSPITAL_COMMUNITY): Payer: Self-pay | Admitting: *Deleted

## 2015-10-27 ENCOUNTER — Emergency Department (HOSPITAL_COMMUNITY): Payer: Medicare Other

## 2015-10-27 ENCOUNTER — Observation Stay (HOSPITAL_COMMUNITY)
Admission: EM | Admit: 2015-10-27 | Discharge: 2015-10-29 | Disposition: A | Discharge status: Home / Self Care | Payer: Medicare Other | Attending: Internal Medicine | Admitting: Internal Medicine

## 2015-10-27 DIAGNOSIS — I10 Essential (primary) hypertension: Secondary | ICD-10-CM | POA: Diagnosis present

## 2015-10-27 DIAGNOSIS — I2510 Atherosclerotic heart disease of native coronary artery without angina pectoris: Secondary | ICD-10-CM | POA: Diagnosis not present

## 2015-10-27 DIAGNOSIS — R079 Chest pain, unspecified: Secondary | ICD-10-CM | POA: Diagnosis present

## 2015-10-27 DIAGNOSIS — K219 Gastro-esophageal reflux disease without esophagitis: Secondary | ICD-10-CM | POA: Diagnosis not present

## 2015-10-27 DIAGNOSIS — Z87891 Personal history of nicotine dependence: Secondary | ICD-10-CM | POA: Insufficient documentation

## 2015-10-27 DIAGNOSIS — E860 Dehydration: Secondary | ICD-10-CM

## 2015-10-27 DIAGNOSIS — Z79899 Other long term (current) drug therapy: Secondary | ICD-10-CM | POA: Insufficient documentation

## 2015-10-27 DIAGNOSIS — R0789 Other chest pain: Secondary | ICD-10-CM

## 2015-10-27 DIAGNOSIS — Z8546 Personal history of malignant neoplasm of prostate: Secondary | ICD-10-CM | POA: Insufficient documentation

## 2015-10-27 DIAGNOSIS — R072 Precordial pain: Principal | ICD-10-CM | POA: Insufficient documentation

## 2015-10-27 DIAGNOSIS — Z955 Presence of coronary angioplasty implant and graft: Secondary | ICD-10-CM | POA: Insufficient documentation

## 2015-10-27 DIAGNOSIS — I4891 Unspecified atrial fibrillation: Secondary | ICD-10-CM | POA: Diagnosis not present

## 2015-10-27 DIAGNOSIS — R05 Cough: Secondary | ICD-10-CM | POA: Diagnosis not present

## 2015-10-27 DIAGNOSIS — N179 Acute kidney failure, unspecified: Secondary | ICD-10-CM | POA: Diagnosis not present

## 2015-10-27 DIAGNOSIS — E7800 Pure hypercholesterolemia, unspecified: Secondary | ICD-10-CM | POA: Diagnosis not present

## 2015-10-27 DIAGNOSIS — Z951 Presence of aortocoronary bypass graft: Secondary | ICD-10-CM | POA: Diagnosis not present

## 2015-10-27 DIAGNOSIS — Z7982 Long term (current) use of aspirin: Secondary | ICD-10-CM | POA: Insufficient documentation

## 2015-10-27 LAB — CBC
HEMATOCRIT: 40.2 % (ref 39.0–52.0)
Hemoglobin: 14.1 g/dL (ref 13.0–17.0)
MCH: 31.3 pg (ref 26.0–34.0)
MCHC: 35.1 g/dL (ref 30.0–36.0)
MCV: 89.1 fL (ref 78.0–100.0)
Platelets: 200 10*3/uL (ref 150–400)
RBC: 4.51 MIL/uL (ref 4.22–5.81)
RDW: 13.2 % (ref 11.5–15.5)
WBC: 6.4 10*3/uL (ref 4.0–10.5)

## 2015-10-27 LAB — BASIC METABOLIC PANEL
Anion gap: 10 (ref 5–15)
BUN: 22 mg/dL — ABNORMAL HIGH (ref 6–20)
CO2: 22 mmol/L (ref 22–32)
Calcium: 9.8 mg/dL (ref 8.9–10.3)
Chloride: 106 mmol/L (ref 101–111)
Creatinine, Ser: 1.25 mg/dL — ABNORMAL HIGH (ref 0.61–1.24)
GFR calc Af Amer: >60 mL/min (ref >60)
GFR calc non Af Amer: 55 mL/min — ABNORMAL LOW (ref >60)
Glucose, Bld: 95 mg/dL (ref 65–99)
Potassium: 3.6 mmol/L (ref 3.5–5.1)
Sodium: 138 mmol/L (ref 135–145)

## 2015-10-27 LAB — I-STAT TROPONIN, ED
Troponin i, poc: 0 ng/mL (ref 0.00–0.08)
Troponin i, poc: 0.01 ng/mL (ref 0.00–0.08)

## 2015-10-27 MED ORDER — ASPIRIN 81 MG PO CHEW
324.0000 mg | CHEWABLE_TABLET | Freq: Once | ORAL | Status: AC
  Administered 2015-10-27: 324 mg via ORAL
  Filled 2015-10-27: qty 4

## 2015-10-27 MED ORDER — PAROXETINE HCL 20 MG PO TABS
10.0000 mg | ORAL_TABLET | Freq: Every day | ORAL | Status: DC
  Administered 2015-10-28 – 2015-10-29 (×2): 10 mg via ORAL
  Filled 2015-10-27 (×2): qty 1

## 2015-10-27 MED ORDER — PANTOPRAZOLE SODIUM 40 MG PO TBEC
40.0000 mg | DELAYED_RELEASE_TABLET | Freq: Two times a day (BID) | ORAL | Status: DC
  Administered 2015-10-27 – 2015-10-29 (×4): 40 mg via ORAL
  Filled 2015-10-27 (×4): qty 1

## 2015-10-27 MED ORDER — ACETAMINOPHEN 325 MG PO TABS: 650.0000 mg | ORAL_TABLET | ORAL | Status: DC | PRN

## 2015-10-27 MED ORDER — NITROGLYCERIN 0.4 MG SL SUBL: 0.4000 mg | SUBLINGUAL_TABLET | SUBLINGUAL | Status: DC | PRN

## 2015-10-27 MED ORDER — ENOXAPARIN SODIUM 40 MG/0.4ML ~~LOC~~ SOLN
40.0000 mg | Freq: Every day | SUBCUTANEOUS | Status: DC
  Administered 2015-10-27 – 2015-10-28 (×2): 40 mg via SUBCUTANEOUS
  Filled 2015-10-27 (×2): qty 0.4

## 2015-10-27 MED ORDER — ONDANSETRON HCL 4 MG/2ML IJ SOLN
4.0000 mg | Freq: Once | INTRAMUSCULAR | Status: AC
Start: 2015-10-27 — End: 2015-10-27
  Administered 2015-10-27: 4 mg via INTRAVENOUS
  Filled 2015-10-27: qty 2

## 2015-10-27 MED ORDER — GABAPENTIN 300 MG PO CAPS
300.0000 mg | ORAL_CAPSULE | Freq: Every day | ORAL | Status: DC
  Administered 2015-10-27 – 2015-10-28 (×2): 300 mg via ORAL
  Filled 2015-10-27 (×2): qty 1

## 2015-10-27 MED ORDER — AMLODIPINE BESYLATE 10 MG PO TABS
10.0000 mg | ORAL_TABLET | Freq: Every day | ORAL | Status: DC
  Administered 2015-10-28 – 2015-10-29 (×2): 10 mg via ORAL
  Filled 2015-10-27 (×2): qty 1

## 2015-10-27 MED ORDER — NITROGLYCERIN 0.4 MG SL SUBL
0.4000 mg | SUBLINGUAL_TABLET | Freq: Once | SUBLINGUAL | Status: AC
  Administered 2015-10-27: 0.4 mg via SUBLINGUAL
  Filled 2015-10-27: qty 1

## 2015-10-27 MED ORDER — ISOSORBIDE DINITRATE 20 MG PO TABS
20.0000 mg | ORAL_TABLET | Freq: Two times a day (BID) | ORAL | Status: DC
  Administered 2015-10-27 – 2015-10-28 (×2): 20 mg via ORAL
  Filled 2015-10-27 (×2): qty 1
  Filled 2015-10-27 (×2): qty 2

## 2015-10-27 MED ORDER — MORPHINE SULFATE (PF) 2 MG/ML IV SOLN: 2.0000 mg | INTRAVENOUS | Status: DC | PRN

## 2015-10-27 MED ORDER — ASPIRIN EC 81 MG PO TBEC
81.0000 mg | DELAYED_RELEASE_TABLET | Freq: Every day | ORAL | Status: DC
  Administered 2015-10-27 – 2015-10-28 (×2): 81 mg via ORAL
  Filled 2015-10-27: qty 1

## 2015-10-27 MED ORDER — FAMOTIDINE IN NACL 20-0.9 MG/50ML-% IV SOLN
20.0000 mg | Freq: Two times a day (BID) | INTRAVENOUS | Status: DC
  Administered 2015-10-27 – 2015-10-28 (×2): 20 mg via INTRAVENOUS
  Filled 2015-10-27 (×3): qty 50

## 2015-10-27 MED ORDER — ONDANSETRON HCL 4 MG/2ML IJ SOLN: 4.0000 mg | Freq: Four times a day (QID) | INTRAMUSCULAR | Status: DC | PRN

## 2015-10-27 MED ORDER — METOPROLOL TARTRATE 25 MG PO TABS
25.0000 mg | ORAL_TABLET | Freq: Two times a day (BID) | ORAL | Status: DC
  Administered 2015-10-27 – 2015-10-29 (×4): 25 mg via ORAL
  Filled 2015-10-27 (×4): qty 1

## 2015-10-27 NOTE — Consult Note (Signed)
CARDIOLOGY INPATIENT CONSULTATION NOTE  Patient ID: [name] [name] MRN: [id number], DOB/AGE: 1940/03/26   Consultation date: 10/27/2015  Requesting Physician: Paralee Cancel MD Attending Physician: Jefm Bryant MD Consulting Physician: Geralynn Ochs MD   Primary Physician: Leonides Sake, MD Primary Cardiologist: Kirk Ruths MD  Reason for consultation: Chest pain in prior CABG patient  HPI: This is a 75 y.o. male with CAD s/p CABG and history of stent to LM, RCA, LCx, HTN, GERD, history of pancreatitis, prostate CA s/p cryoablation, hyperlipidemia, chronic LBBB, and history of carotid artery disease s/p left CEA who presented 08/03/14 with worsening CP over the last two weeks.  Pt describes CP present in the midsternal region, radiating to neck and head which is intermittent in quality. He was admitted for this CP last year in 07/2014 as well as in 09/2012. This CP does not respond to NTG. The CP resolved prior to coming to the ED. He has associated nausea. Previously he has been found to have esophageal stricture which was treated with dilatation in 2016. He also has hiatal hernia. CP stays for hours and is colicky in nature. This pain usually occurs in the Am after the breakfast.  Of note, during 9/14 admission, he underwent LHC by Dr. Haroldine Laws which showed continued patency of IMA to LAD, radial to diagonal, patent LMCA and CFx stents with mild narrowing of OM1, EF 55%. Cardiac catheterization showed partial dissection of left subclavian and left vertebral with good flow at the end of the case and no drop in the left arm pressure after he had a Myoview performed which showed septal hypokinesis w/o inducible ischemia and scar in apical and anteroseptal wall.     Problem List: Past Medical History:  Diagnosis Date  . Anemia 10/06/2011  . Anginal pain ([place])   . Arthritis    "joints hurt; shoulders, arms, back" (08/13/2013)  . Atrial fibrillation ([place])   . Carotid stenosis 04/10/11    a. s/p left carotid endarterectomy 04/14/2011.;  b.  Carotid US (11/13):  L CEA ok; RICA 1-39%  . Chronic bronchitis ([place])    "get it q yr"  . Chronic chest pain   . Chronic lower back pain   . Colon polyp    adenomatous  . Coronary artery disease    a. S/p CABG in 2001. b. S/p DES to protected LM and BMS to RCA 2006. c. 12/2009: s/p DES to Cx;  d. 09/2011 Cath: patent stents, patent grafts -->Med Rx.;  e. CP with abnl Nuc => LHC 10/04/12: oLM 30%, dLM stent into the CFX patent, LAD occluded, LIMA-LAD patent, RI 30%, mid AVCFX 30%, oOM1 50%, oRCA stent patent, mRCA 50-60%, Radial graft-Dx patent; EF 55%.=> Med Rx  . Daily headache    "for the last couple weeks" (08/13/2013)  . Diverticulosis   . Esophageal stricture   . GERD (gastroesophageal reflux disease)   . Hemorrhoids   . Hiatal hernia   . Hyperlipidemia   . Hypertension   . Left bundle branch block   . Pancreatitis Dec 2011   ERCP ok.  . Prostate cancer [facility])    s/p cryoablation  . Renal cyst    Seen on CT 08/2011 also with circumferential bladder wall thickening  . Skin cancer    "cut it off my right ear"  . Statin intolerance     Past Surgical History:  Procedure Laterality Date  . CARDIAC CATHETERIZATION    . CHOLECYSTECTOMY    . COLONOSCOPY    .  CORONARY ANGIOPLASTY WITH STENT PLACEMENT     "1 + 1"  . CORONARY ARTERY BYPASS GRAFT  2001  . ENDARTERECTOMY  04/14/2011   Procedure: ENDARTERECTOMY CAROTID;  Surgeon: Mal Misty, MD;  Location: [place];  Service: Vascular;  Laterality: Left;  Would like to perform procedure first, at 0730  . INGUINAL HERNIA REPAIR Bilateral   . LEFT HEART CATHETERIZATION WITH CORONARY/GRAFT ANGIOGRAM N/A 10/05/2011   Procedure: LEFT HEART CATHETERIZATION WITH Beatrix Fetters;  Surgeon: Hillary Bow, MD;  Location: [facility] CATH LAB;  Service: Cardiovascular;  Laterality: N/A;  . LEFT HEART CATHETERIZATION WITH CORONARY/GRAFT ANGIOGRAM N/A 10/04/2012   Procedure: LEFT HEART  CATHETERIZATION WITH Beatrix Fetters;  Surgeon: Jolaine Artist, MD;  Location: [facility] CATH LAB;  Service: Cardiovascular;  Laterality: N/A;  . POLYPECTOMY    . PR VEIN BYPASS GRAFT,AORTO-FEM-POP    . PROSTATE CRYOABLATION       Allergies:  Allergies  Allergen Reactions  . Amoxicillin-Pot Clavulanate     REACTION: 'Burns' Stomach  . Codeine Nausea And Vomiting  . Erythromycin Other (See Comments)    All mycins cause upset stomach  . Hydrocodone Nausea And Vomiting  . Morphine Nausea And Vomiting  . Nitrofuran Derivatives Other (See Comments)    unknown  . Oxycodone Hcl Nausea And Vomiting  . Shellfish Allergy Swelling  . Tramadol Nausea And Vomiting  . Zolpidem Tartrate Other (See Comments)    hallucinations     Home Medications Current Facility-Administered Medications  Medication Dose Route Frequency Provider Last Rate Last Dose  . nitroGLYCERIN (NITROSTAT) SL tablet 0.4 mg  0.4 mg Sublingual Once Paralee Cancel, MD      . ondansetron [facility]) injection 4 mg  4 mg Intravenous Once Paralee Cancel, MD       Current Outpatient Prescriptions  Medication Sig Dispense Refill  . amLODipine (NORVASC) 10 MG tablet Take 10 mg by mouth daily.    Marland Kitchen aspirin EC 81 MG tablet Take 81 mg by mouth at bedtime.     . diphenhydrAMINE (BENADRYL) 25 MG tablet Take 25 mg by mouth every 8 (eight) hours as needed for allergies.    Marland Kitchen gabapentin (NEURONTIN) 300 MG capsule Take 300 mg by mouth at bedtime.    . isosorbide dinitrate (ISORDIL) 20 MG tablet Take 20 mg by mouth 2 (two) times daily.    . meclizine (ANTIVERT) 12.5 MG tablet Take 1 tablet (12.5 mg total) by mouth 3 (three) times daily as needed for dizziness. 30 tablet 0  . metoprolol tartrate (LOPRESSOR) 25 MG tablet Take 0.5 tablets (12.5 mg total) by mouth 2 (two) times daily. (Patient taking differently: Take 25 mg by mouth 2 (two) times daily. ) 30 tablet 6  . nitroGLYCERIN (NITROSTAT) 0.4 MG SL tablet Place 1 tablet (0.4 mg  total) under the tongue every 5 (five) minutes as needed. For chest pain 25 tablet 3  . pantoprazole (PROTONIX) 40 MG tablet Take 40 mg by mouth 2 (two) times daily.     Marland Kitchen PARoxetine (PAXIL) 10 MG tablet Take 10 mg by mouth daily.       Family History  Problem Relation Age of Onset  . Heart disease Mother     Heart Disease before age 53  . Hypertension Mother   . Heart attack Mother   . Lung cancer Father   . Hypertension Sister   . Heart disease Sister     Heart Disease before age 20  . Cancer Sister   . Heart  attack Sister   . Hypertension Brother   . Heart disease Brother     Heart Disease before age 41  . Heart attack Brother   . Colon cancer Neg Hx      Social History   Social History  . Marital status: Married    Spouse name: N/A  . Number of children: 2  . Years of education: N/A   Occupational History  . Retired    Social History Main Topics  . Smoking status: Former Smoker    Years: 0.10    Types: Cigarettes  . Smokeless tobacco: Current User    Types: Chew     Comment: "smoked a few cigarettes; no more than 1 month"  . Alcohol use No     Comment: "no alcohol since I was a teenager"  . Drug use: No  . Sexual activity: No   Other Topics Concern  . Not on file   Social History Narrative  . No narrative on file     Review of Systems: General: diaphoresis Cardiovascular: chest pain, dyspnea negative for dyspnea on exertion, edema, orthopnea, palpitations, paroxysmal nocturnal dyspnea  Dermatological: negative for rash Respiratory: negative for cough or wheezing Urologic: negative for hematuria Abdominal: negative for nausea, vomiting, diarrhea, bright red blood per rectum, melena, or hematemesis Neurologic: negative for visual changes, syncope, or dizziness Endocrine: no diabetes, no hypothyroidism Immunological: no lymph adenopathy Psych: non homicidal/suicidal  Physical Exam: Vitals: BP 135/65 (BP Location: Left Arm)   Pulse (!) 59   Temp  98.2 F (36.8 C) (Oral)   Resp 16   Ht 5\' 4"  (1.626 m)   Wt 63.5 kg (140 lb)   SpO2 97%   BMI 24.03 kg/m  General: not in acute distress Neck: JVP flat, neck supple Heart: regular rate and rhythm, S1, S2, no murmurs  Lungs: CTAB  GI: non tender, non distended, bowel sounds present Extremities: no edema Neuro: AAO x 3  Psych: normal affect, no anxiety   Labs:   Results for orders placed or performed during the hospital encounter of 10/27/15 (from the past 24 hour(s))  Basic metabolic panel     Status: Abnormal   Collection Time: 10/27/15  5:04 PM  Result Value Ref Range   Sodium 138 135 - 145 mmol/L   Potassium 3.6 3.5 - 5.1 mmol/L   Chloride 106 101 - 111 mmol/L   CO2 22 22 - 32 mmol/L   Glucose, Bld 95 65 - 99 mg/dL   BUN 22 (H) 6 - 20 mg/dL   Creatinine, Ser 1.25 (H) 0.61 - 1.24 mg/dL   Calcium 9.8 8.9 - 10.3 mg/dL   GFR calc non Af Amer 55 (L) >60 mL/min   GFR calc Af Amer >60 >60 mL/min   Anion gap 10 5 - 15  CBC     Status: None   Collection Time: 10/27/15  5:04 PM  Result Value Ref Range   WBC 6.4 4.0 - 10.5 K/uL   RBC 4.51 4.22 - 5.81 MIL/uL   Hemoglobin 14.1 13.0 - 17.0 g/dL   HCT 40.2 39.0 - 52.0 %   MCV 89.1 78.0 - 100.0 fL   MCH 31.3 26.0 - 34.0 pg   MCHC 35.1 30.0 - 36.0 g/dL   RDW 13.2 11.5 - 15.5 %   Platelets 200 150 - 400 K/uL  I-stat troponin, ED     Status: None   Collection Time: 10/27/15  5:35 PM  Result Value Ref Range   Troponin  i, poc 0.00 0.00 - 0.08 ng/mL   Comment 3             Radiology/Studies: Dg Chest 2 View  Result Date: 10/27/2015 CLINICAL DATA:  Central chest pain, productive cough and shortness of breath for 2 weeks. EXAM: CHEST  2 VIEW COMPARISON:  08/19/2015 FINDINGS: There are postoperative changes of the mediastinum as before. There is no acute infiltrate, consolidation, or pleural effusion identified. Cardiomediastinal silhouette is stable. There is atherosclerosis of the aortic arch. No pneumothorax. Surgical clips in the  right upper quadrant. Degenerative changes of the spine. IMPRESSION: 1. No radiographic evidence for acute cardiopulmonary abnormality 2. Atherosclerosis of the aorta 3. Postsurgical changes of the mediastinum Electronically Signed   By: Donavan Foil M.D.   On: 10/27/2015 19:11    EKG: 10/27/15  normal sinus rhythm, non specific interventricular conduction block with repolarization changes unchanged from prior ECG  Echo: ordered  Cardiac cath:  [facility] 10/04/12: oLM 30%, dLM stent into the CFX patent, LAD occluded, LIMA-LAD patent, RI 30%, mid AVCFX 30%, oOM1 50%, oRCA stent patent, mRCA 50-60%, Radial graft-Dx patent; EF 55%  2013  a. S/p CABG in 2001. b. S/p DES to protected LM and BMS to RCA 2006. c. 12/2009: had CP with negative stress test with repeat cath s/p DES to Cx;  d. 09/2011 Cath: patent  RCA, LM, LCX stents, patent LIMA->LAD & Radial->Diag,EF 50-55%-->Med Rx.   [place] 12/2009 demonstrated a 90% stenosis at the bifurcation of AV circumflex/OM and he had a DES to the LCx   Medical decision making:  Discussed care with the patient Discussed care with the physician on the phone Reviewed labs and imaging personally Reviewed prior records  ASSESSMENT AND PLAN:  This is a 75 y.o. male with known history of CAD s/p CABG, chronic CP syndrome, mild GERD with esophagitis, hiatal hernia and esophageal stricture s/p dilatation 2016 and risk factors presenting with intermittent CP.  CP associated with abdominal symptoms and atypical of cardiac CP. Troponin -ve and ECG unchanged.    Active Problems:   Chest pain syndrome  Chest pain syndrome, likely non cardiac - cycle trop, r/o ACS, echo in the morning to evaluate for wall motion  - admit to medicine for GI work up  - if no alternative diagnosis, can increase isodril dose. Consider adding high dose statin to the home regimen     Signed, Flossie Dibble, MD MS 10/27/2015, 9:16 PM

## 2015-10-27 NOTE — ED Notes (Signed)
Pt called out, asking for emesis bag, stating nausea keeps "coming and going." MD notified.

## 2015-10-27 NOTE — ED Notes (Signed)
MD at bedside. Pt hooked back up to monitor.

## 2015-10-27 NOTE — ED Notes (Signed)
Pt transported to xray 

## 2015-10-27 NOTE — ED Notes (Signed)
MD at bedside. 

## 2015-10-27 NOTE — ED Triage Notes (Signed)
Pt c/o central chest pain radiating into head for a couple of weeks. Pt reports taking nitro for a week without relief. Pt took nitro x 3 today with no relief. No blood thinners. Pt denies any new associative symptoms with the pain.

## 2015-10-27 NOTE — H&P (Signed)
History and Physical    [name] [name] [id number] DOB: 1940-10-24 DOA: 10/27/2015  PCP: Leonides Sake, MD  Patient coming from: Home.  Chief Complaint: Chest pain.  HPI: [name] [name] is a 75 y.o. male with CAD status post CABG and stent placement, hypertension, history of carotid endarterectomy, history of pancreatitis, history of esophageal stricture requiring dilation last year, history of prostate cancer status post cryoablation presents to the ER because of chest pain. Patient has been experiencing chest pain over the last 2 weeks. Initially pain was only in the mornings but over the last few days became more persistent throughout the day. Pain is present even at rest sometimes on exertion. Denies any associated shortness of breath productive cough fever chills or diaphoresis. Patient has chronic LBBB. Cardiac markers were negative chest x-ray did not show anything acute. On-call cardiologist was consulted and patient is being admitted for further observation for chest pain.   ED Course: EKG was showing LBBB chest x-ray was unremarkable troponin was negative.  Review of Systems: As per HPI, rest all negative.   Past Medical History:  Diagnosis Date  . Anemia 10/06/2011  . Anginal pain ([place])   . Arthritis    "joints hurt; shoulders, arms, back" (08/13/2013)  . Atrial fibrillation ([place])   . Carotid stenosis 04/10/11   a. s/p left carotid endarterectomy 04/14/2011.;  b.  Carotid US (11/13):  L CEA ok; RICA 1-39%  . Chronic bronchitis ([place])    "get it q yr"  . Chronic chest pain   . Chronic lower back pain   . Colon polyp    adenomatous  . Coronary artery disease    a. S/p CABG in 2001. b. S/p DES to protected LM and BMS to RCA 2006. c. 12/2009: s/p DES to Cx;  d. 09/2011 Cath: patent stents, patent grafts -->Med Rx.;  e. CP with abnl Nuc => LHC 10/04/12: oLM 30%, dLM stent into the CFX patent, LAD occluded, LIMA-LAD patent, RI 30%, mid AVCFX 30%, oOM1 50%, oRCA stent patent, mRCA  50-60%, Radial graft-Dx patent; EF 55%.=> Med Rx  . Daily headache    "for the last couple weeks" (08/13/2013)  . Diverticulosis   . Esophageal stricture   . GERD (gastroesophageal reflux disease)   . Hemorrhoids   . Hiatal hernia   . Hyperlipidemia   . Hypertension   . Left bundle branch block   . Pancreatitis Dec 2011   ERCP ok.  . Prostate cancer [facility])    s/p cryoablation  . Renal cyst    Seen on CT 08/2011 also with circumferential bladder wall thickening  . Skin cancer    "cut it off my right ear"  . Statin intolerance     Past Surgical History:  Procedure Laterality Date  . CARDIAC CATHETERIZATION    . CHOLECYSTECTOMY    . COLONOSCOPY    . CORONARY ANGIOPLASTY WITH STENT PLACEMENT     "1 + 1"  . CORONARY ARTERY BYPASS GRAFT  2001  . ENDARTERECTOMY  04/14/2011   Procedure: ENDARTERECTOMY CAROTID;  Surgeon: Mal Misty, MD;  Location: [place];  Service: Vascular;  Laterality: Left;  Would like to perform procedure first, at 0730  . INGUINAL HERNIA REPAIR Bilateral   . LEFT HEART CATHETERIZATION WITH CORONARY/GRAFT ANGIOGRAM N/A 10/05/2011   Procedure: LEFT HEART CATHETERIZATION WITH Beatrix Fetters;  Surgeon: Hillary Bow, MD;  Location[facility] C[facility] LAB;  Service: Cardiovascular;  Laterality: N/A;  . LEFT HEART CATHETERIZATION WITH CORONARY/GRAFT  ANGIOGRAM N/A 10/04/2012   Procedure: LEFT HEART CATHETERIZATION WITH Beatrix Fetters;  Surgeon: Jolaine Artist, MD;  Location: [facility] CATH LAB;  Service: Cardiovascular;  Laterality: N/A;  . POLYPECTOMY    . PR VEIN BYPASS GRAFT,AORTO-FEM-POP    . PROSTATE CRYOABLATION       reports that he has quit smoking. His smoking use included Cigarettes. He quit after 0.10 years of use. His smokeless tobacco use includes Chew. He reports that he does not drink alcohol or use drugs.  Allergies  Allergen Reactions  . Amoxicillin-Pot Clavulanate     REACTION: 'Burns' Stomach  . Codeine Nausea And Vomiting  .  Erythromycin Other (See Comments)    All mycins cause upset stomach  . Hydrocodone Nausea And Vomiting  . Morphine Nausea And Vomiting  . Nitrofuran Derivatives Other (See Comments)    unknown  . Oxycodone Hcl Nausea And Vomiting  . Shellfish Allergy Swelling  . Tramadol Nausea And Vomiting  . Zolpidem Tartrate Other (See Comments)    hallucinations    Family History  Problem Relation Age of Onset  . Heart disease Mother     Heart Disease before age 49  . Hypertension Mother   . Heart attack Mother   . Lung cancer Father   . Hypertension Sister   . Heart disease Sister     Heart Disease before age 15  . Cancer Sister   . Heart attack Sister   . Hypertension Brother   . Heart disease Brother     Heart Disease before age 77  . Heart attack Brother   . Colon cancer Neg Hx     Prior to Admission medications   Medication Sig Start Date End Date Taking? Authorizing Provider  amLODipine (NORVASC) 10 MG tablet Take 10 mg by mouth daily.   Yes Historical Provider, MD  aspirin EC 81 MG tablet Take 81 mg by mouth at bedtime.    Yes Historical Provider, MD  diphenhydrAMINE (BENADRYL) 25 MG tablet Take 25 mg by mouth every 8 (eight) hours as needed for allergies.   Yes Historical Provider, MD  gabapentin (NEURONTIN) 300 MG capsule Take 300 mg by mouth at bedtime.   Yes Historical Provider, MD  isosorbide dinitrate (ISORDIL) 20 MG tablet Take 20 mg by mouth 2 (two) times daily.   Yes Historical Provider, MD  meclizine (ANTIVERT) 12.5 MG tablet Take 1 tablet (12.5 mg total) by mouth 3 (three) times daily as needed for dizziness. 09/05/15  Yes Lacretia Leigh, MD  metoprolol tartrate (LOPRESSOR) 25 MG tablet Take 0.5 tablets (12.5 mg total) by mouth 2 (two) times daily. Patient taking differently: Take 25 mg by mouth 2 (two) times daily.  08/04/14  Yes Bhavinkumar Bhagat, PA  nitroGLYCERIN (NITROSTAT) 0.4 MG SL tablet Place 1 tablet (0.4 mg total) under the tongue every 5 (five) minutes as  needed. For chest pain 07/21/13  Yes Scott Joylene Draft, PA-C  pantoprazole (PROTONIX) 40 MG tablet Take 40 mg by mouth 2 (two) times daily.    Yes Historical Provider, MD  PARoxetine (PAXIL) 10 MG tablet Take 10 mg by mouth daily.   Yes Historical Provider, MD    Physical Exam: Vitals:   10/27/15 2100 10/27/15 2111 10/27/15 2145 10/27/15 2200  BP: 141/65 135/65 125/70 168/75  Pulse: (!) 55 (!) 59 (!) 55 (!) 55  Resp: 13 16 15 16   Temp:  98.2 F (36.8 C)    TempSrc:  Oral    SpO2: 97% 97% 95% 98%  Weight:      Height:          Constitutional: Moderately built and nourished. Vitals:   10/27/15 2100 10/27/15 2111 10/27/15 2145 10/27/15 2200  BP: 141/65 135/65 125/70 168/75  Pulse: (!) 55 (!) 59 (!) 55 (!) 55  Resp: 13 16 15 16   Temp:  98.2 F (36.8 C)    TempSrc:  Oral    SpO2: 97% 97% 95% 98%  Weight:      Height:       Eyes: Anicteric. no pallor. ENMT: No discharge from the ears eyes nose or mouth. Neck: No JVD appreciated no mass felt. Respiratory: No rhonchi or crepitations. Cardiovascular: S1 and S2 heard. No murmur appreciated. Abdomen: Soft nontender bowel sounds present. No guarding or rigidity. Musculoskeletal: No edema. No joint effusion. Skin: No rash. Skin appears warm. Neurologic: Moves all extremities 5 x 5. No facial asymmetry tongue is midline. Psychiatric: Alert awake oriented to time place and person. Normal affect.   Labs on Admission: I have personally reviewed following labs and imaging studies  CBC:  Recent Labs Lab 10/27/15 1704  WBC 6.4  HGB 14.1  HCT 40.2  MCV 89.1  PLT [id number]   Basic Metabolic Panel:  Recent Labs Lab 10/27/15 1704  NA 138  K 3.6  CL 106  CO2 22  GLUCOSE 95  BUN 22*  CREATININE 1.25*  CALCIUM 9.8   GFR: Estimated Creatinine Clearance: 42.8 mL/min (by C-G formula based on SCr of 1.25 mg/dL (H)). Liver Function Tests: No results for input(s): AST, ALT, ALKPHOS, BILITOT, PROT, ALBUMIN in the last 168 hours. No  results for input(s): LIPASE, AMYLASE in the last 168 hours. No results for input(s): AMMONIA in the last 168 hours. Coagulation Profile: No results for input(s): INR, PROTIME in the last 168 hours. Cardiac Enzymes: No results for input(s): CKTOTAL, CKMB, CKMBINDEX, TROPONINI in the last 168 hours. BNP (last 3 results) No results for input(s): PROBNP in the last 8760 hours. HbA1C: No results for input(s): HGBA1C in the last 72 hours. CBG: No results for input(s): GLUCAP in the last 168 hours. Lipid Profile: No results for input(s): CHOL, HDL, LDLCALC, TRIG, CHOLHDL, LDLDIRECT in the last 72 hours. Thyroid Function Tests: No results for input(s): TSH, T4TOTAL, FREET4, T3FREE, THYROIDAB in the last 72 hours. Anemia Panel: No results for input(s): VITAMINB12, FOLATE, FERRITIN, TIBC, IRON, RETICCTPCT in the last 72 hours. Urine analysis:    Component Value Date/Time   COLORURINE YELLOW 09/05/2015 [place] 09/05/2015 1247   LABSPEC 1.019 09/05/2015 1247   PHURINE 5.0 09/05/2015 1247   GLUCOSEU NEGATIVE 09/05/2015 1247   HGBUR NEGATIVE 09/05/2015 1247   BILIRUBINUR NEGATIVE 09/05/2015 1247   KETONESUR NEGATIVE 09/05/2015 1247   PROTEINUR NEGATIVE 09/05/2015 1247   UROBILINOGEN 0.2 02/08/2014 1315   NITRITE NEGATIVE 09/05/2015 1247   LEUKOCYTESUR NEGATIVE 09/05/2015 1247   Sepsis Labs: @LABRCNTIP (procalcitonin:4,lacticidven:4) )No results found for this or any previous visit (from the past 240 hour(s)).   Radiological Exams on Admission: Dg Chest 2 View  Result Date: 10/27/2015 CLINICAL DATA:  Central chest pain, productive cough and shortness of breath for 2 weeks. EXAM: CHEST  2 VIEW COMPARISON:  08/19/2015 FINDINGS: There are postoperative changes of the mediastinum as before. There is no acute infiltrate, consolidation, or pleural effusion identified. Cardiomediastinal silhouette is stable. There is atherosclerosis of the aortic arch. No pneumothorax. Surgical  clips in the right upper quadrant. Degenerative changes of the spine. IMPRESSION: 1. No radiographic  evidence for acute cardiopulmonary abnormality 2. Atherosclerosis of the aorta 3. Postsurgical changes of the mediastinum Electronically Signed   By: Donavan Foil M.D.   On: 10/27/2015 19:11    EKG: Independently reviewed. Sinus rhythm with LBBB.  Assessment/Plan Principal Problem:   Chest pain Active Problems:   HYPERCHOLESTEROLEMIA, PURE   HYPERTENSION, BENIGN   CAD, ARTERY BYPASS GRAFT    1. Chest pain - appreciate cardiology consult. As per cardiology patient chest pain could be cardiac versus GI related. Check 2-D echo cycle cardiac markers aspirin nitroglycerin. Patient on PPI. May need GI workup if chest pain does not improve. Patient has had history of esophageal dilation last year. 2. Hypertension on Norvasc and metoprolol. 3. CAD status post CABG and stents - on aspirin and metoprolol. Intolerant to statins. 4. History of esophageal stricture on PPI. 5. History of prostate cancer status post cryoablation. 6. History of carotid endarterectomy.   DVT prophylaxis: Lovenox. Code Status: Full code.  Family Communication: Discussed with patient.  Disposition Plan: Home.  Consults called: Cardiology.  Admission status: Observation.    Rise Patience MD [facility] Pager [phone].  If 7PM-7AM, please contact night-coverage [email or web address] Password [email or web address]  10/27/2015, 10:24 PM

## 2015-10-27 NOTE — ED Provider Notes (Signed)
[facility] DEPT Provider Note   CSN: [id number] Arrival date & time: 10/27/15  1648     History   Chief Complaint Chief Complaint  Patient presents with  . Chest Pain    HPI [name] [name] is a 75 y.o. male.  The history is provided by the patient.  Chest Pain   This is a new problem. The current episode started more than 1 week ago (x 2 weeks). The problem occurs daily (intermittently). The problem has been gradually worsening. The pain is associated with rest. The pain is present in the substernal region and lateral region. The pain is moderate. The quality of the pain is described as pressure-like. The pain radiates to the left neck (and radiates to head). Duration of episode(s) is 1 hour. Associated symptoms include diaphoresis, nausea and shortness of breath. Pertinent negatives include no abdominal pain, no back pain, no cough, no fever, no leg pain, no lower extremity edema, no numbness, no vomiting and no weakness. He has tried rest for the symptoms. The treatment provided no relief.  His past medical history is significant for CAD.    Past Medical History:  Diagnosis Date  . Anemia 10/06/2011  . Anginal pain ([place])   . Arthritis    "joints hurt; shoulders, arms, back" (08/13/2013)  . Atrial fibrillation ([place])   . Carotid stenosis 04/10/11   a. s/p left carotid endarterectomy 04/14/2011.;  b.  Carotid US (11/13):  L CEA ok; RICA 1-39%  . Chronic bronchitis ([place])    "get it q yr"  . Chronic chest pain   . Chronic lower back pain   . Colon polyp    adenomatous  . Coronary artery disease    a. S/p CABG in 2001. b. S/p DES to protected LM and BMS to RCA 2006. c. 12/2009: s/p DES to Cx;  d. 09/2011 Cath: patent stents, patent grafts -->Med Rx.;  e. CP with abnl Nuc => LHC 10/04/12: oLM 30%, dLM stent into the CFX patent, LAD occluded, LIMA-LAD patent, RI 30%, mid AVCFX 30%, oOM1 50%, oRCA stent patent, mRCA 50-60%, Radial graft-Dx patent; EF 55%.=> Med Rx  . Daily headache      "for the last couple weeks" (08/13/2013)  . Diverticulosis   . Esophageal stricture   . GERD (gastroesophageal reflux disease)   . Hemorrhoids   . Hiatal hernia   . Hyperlipidemia   . Hypertension   . Left bundle branch block   . Pancreatitis Dec 2011   ERCP ok.  . Prostate cancer [facility])    s/p cryoablation  . Renal cyst    Seen on CT 08/2011 also with circumferential bladder wall thickening  . Skin cancer    "cut it off my right ear"  . Statin intolerance     Patient Active Problem List   Diagnosis Date Noted  . Chest pain 10/27/2015  . Atypical chest pain 08/03/2014  . Abdominal pain, chronic, epigastric 08/26/2013  . Abdominal pain, lower 08/15/2013  . CAD (coronary artery disease) 10/05/2012  . Chest pain syndrome 10/06/2011  . Anemia 10/06/2011  . Memory loss 04/07/2011  . ANXIETY 02/01/2010  . ABDOMINAL PAIN-EPIGASTRIC 02/01/2010  . ADENOCARCINOMA, PROSTATE 12/22/2008  . DYSPHAGIA UNSPECIFIED 11/18/2008  . COLONIC POLYPS, HX OF 11/18/2008  . HYPERCHOLESTEROLEMIA, PURE 01/20/2008  . HYPERTENSION, BENIGN 01/20/2008  . CAD, ARTERY BYPASS GRAFT 01/20/2008  . LBBB 01/20/2008  . Carotid stenosis 01/20/2008  . GERD 01/20/2008  . CHEST PAIN, NON-CARDIAC 01/20/2008    Past Surgical  History:  Procedure Laterality Date  . CARDIAC CATHETERIZATION    . CHOLECYSTECTOMY    . COLONOSCOPY    . CORONARY ANGIOPLASTY WITH STENT PLACEMENT     "1 + 1"  . CORONARY ARTERY BYPASS GRAFT  2001  . ENDARTERECTOMY  04/14/2011   Procedure: ENDARTERECTOMY CAROTID;  Surgeon: Mal Misty, MD;  Location: [facility];  Service: Vascular;  Laterality: Left;  Would like to perform procedure first, at 0730  . INGUINAL HERNIA REPAIR Bilateral   . LEFT HEART CATHETERIZATION WITH CORONARY/GRAFT ANGIOGRAM N/A 10/05/2011   Procedure: LEFT HEART CATHETERIZATION WITH Beatrix Fetters;  Surgeon: Hillary Bow, MD;  Location: [facility] CATH LAB;  Service: Cardiovascular;  Laterality: N/A;  . LEFT HEART  CATHETERIZATION WITH CORONARY/GRAFT ANGIOGRAM N/A 10/04/2012   Procedure: LEFT HEART CATHETERIZATION WITH Beatrix Fetters;  Surgeon: Jolaine Artist, MD;  Location: [facility] CATH LAB;  Service: Cardiovascular;  Laterality: N/A;  . POLYPECTOMY    . PR VEIN BYPASS GRAFT,AORTO-FEM-POP    . PROSTATE CRYOABLATION         Home Medications    Prior to Admission medications   Medication Sig Start Date End Date Taking? Authorizing Provider  amLODipine (NORVASC) 10 MG tablet Take 10 mg by mouth daily.   Yes Historical Provider, MD  aspirin EC 81 MG tablet Take 81 mg by mouth at bedtime.    Yes Historical Provider, MD  diphenhydrAMINE (BENADRYL) 25 MG tablet Take 25 mg by mouth every 8 (eight) hours as needed for allergies.   Yes Historical Provider, MD  gabapentin (NEURONTIN) 300 MG capsule Take 300 mg by mouth at bedtime.   Yes Historical Provider, MD  isosorbide dinitrate (ISORDIL) 20 MG tablet Take 20 mg by mouth 2 (two) times daily.   Yes Historical Provider, MD  meclizine (ANTIVERT) 12.5 MG tablet Take 1 tablet (12.5 mg total) by mouth 3 (three) times daily as needed for dizziness. 09/05/15  Yes Lacretia Leigh, MD  metoprolol tartrate (LOPRESSOR) 25 MG tablet Take 0.5 tablets (12.5 mg total) by mouth 2 (two) times daily. Patient taking differently: Take 25 mg by mouth 2 (two) times daily.  08/04/14  Yes Bhavinkumar Bhagat, PA  nitroGLYCERIN (NITROSTAT) 0.4 MG SL tablet Place 1 tablet (0.4 mg total) under the tongue every 5 (five) minutes as needed. For chest pain 07/21/13  Yes Scott Joylene Draft, PA-C  pantoprazole (PROTONIX) 40 MG tablet Take 40 mg by mouth 2 (two) times daily.    Yes Historical Provider, MD  PARoxetine (PAXIL) 10 MG tablet Take 10 mg by mouth daily.   Yes Historical Provider, MD    Family History Family History  Problem Relation Age of Onset  . Heart disease Mother     Heart Disease before age 38  . Hypertension Mother   . Heart attack Mother   . Lung cancer Father   .  Hypertension Sister   . Heart disease Sister     Heart Disease before age 38  . Cancer Sister   . Heart attack Sister   . Hypertension Brother   . Heart disease Brother     Heart Disease before age 19  . Heart attack Brother   . Colon cancer Neg Hx     Social History Social History  Substance Use Topics  . Smoking status: Former Smoker    Years: 0.10    Types: Cigarettes  . Smokeless tobacco: Current User    Types: Chew     Comment: "smoked a few cigarettes; no  more than 1 month"  . Alcohol use No     Comment: "no alcohol since I was a teenager"     Allergies   Amoxicillin-pot clavulanate; Codeine; Erythromycin; Hydrocodone; Morphine; Nitrofuran derivatives; Oxycodone hcl; Shellfish allergy; Tramadol; and Zolpidem tartrate   Review of Systems Review of Systems  Constitutional: Positive for diaphoresis. Negative for fever.  HENT: Negative for congestion.   Respiratory: Positive for shortness of breath. Negative for cough.   Cardiovascular: Positive for chest pain. Negative for leg swelling.  Gastrointestinal: Positive for nausea. Negative for abdominal pain and vomiting.  Genitourinary: Negative for flank pain.  Musculoskeletal: Negative for back pain.  Skin: Negative for rash.  Neurological: Negative for weakness and numbness.  Psychiatric/Behavioral: Negative for confusion.     Physical Exam Updated Vital Signs BP (!) 164/63 (BP Location: Left Arm)   Pulse (!) 57   Temp 97.5 F (36.4 C) (Oral)   Resp 11   Ht 5\' 4"  (1.626 m)   Wt 63.5 kg   SpO2 98%   BMI 24.03 kg/m   Physical Exam  Constitutional: He is oriented to person, place, and time. He appears well-developed and well-nourished. No distress.  Pleasant, cooperative, non-toxic appearing  HENT:  Head: Normocephalic and atraumatic.  Eyes: Conjunctivae are normal. No scleral icterus.  Neck: Normal range of motion. Neck supple. No JVD present.  Cardiovascular: Normal rate, regular rhythm and intact  distal pulses.  Exam reveals no gallop and no friction rub.   Murmur heard. Pulmonary/Chest: Effort normal and breath sounds normal.  Abdominal: Soft. He exhibits no distension. There is no tenderness.  Musculoskeletal: He exhibits no edema or tenderness.  Symmetric size and appearance of b/l Le's, no calf swelling or tenderness   Neurological: He is alert and oriented to person, place, and time. He exhibits normal muscle tone. Coordination normal.  Skin: Skin is warm and dry. He is not diaphoretic.  Psychiatric: He has a normal mood and affect.  Nursing note and vitals reviewed.    ED Treatments / Results  Labs (all labs ordered are listed, but only abnormal results are displayed) Labs Reviewed  BASIC METABOLIC PANEL - Abnormal; Notable for the following:       Result Value   BUN 22 (*)    Creatinine, Ser 1.25 (*)    GFR calc non Af Amer 55 (*)    All other components within normal limits  MRSA PCR SCREENING  CBC  I-STAT TROPOININ, ED  I-STAT TROPOININ, ED    EKG  EKG Interpretation  Date/Time:  Wednesday October 27 2015 17:00:49 EDT Ventricular Rate:  61 PR Interval:  134 QRS Duration: 142 QT Interval:  456 QTC Calculation: 459 R Axis:   117 Text Interpretation:  Normal sinus rhythm Non-specific intra-ventricular conduction block T wave abnormality, consider inferolateral ischemia  - likely related to LBBB Abnormal ECG No significant change since last tracing Confirmed by [facility] MD, Corene Cornea [phone]) on 10/27/2015 6:27:49 PM       Radiology Dg Chest 2 View  Result Date: 10/27/2015 CLINICAL DATA:  Central chest pain, productive cough and shortness of breath for 2 weeks. EXAM: CHEST  2 VIEW COMPARISON:  08/19/2015 FINDINGS: There are postoperative changes of the mediastinum as before. There is no acute infiltrate, consolidation, or pleural effusion identified. Cardiomediastinal silhouette is stable. There is atherosclerosis of the aortic arch. No pneumothorax. Surgical clips  in the right upper quadrant. Degenerative changes of the spine. IMPRESSION: 1. No radiographic evidence for acute cardiopulmonary abnormality 2.  Atherosclerosis of the aorta 3. Postsurgical changes of the mediastinum Electronically Signed   By: Donavan Foil M.D.   On: 10/27/2015 19:11    Procedures Procedures (including critical care time)  Medications Ordered in ED Medications  gabapentin (NEURONTIN) capsule 300 mg (300 mg Oral Given 10/27/15 2342)  PARoxetine (PAXIL) tablet 10 mg (not administered)  isosorbide dinitrate (ISORDIL) tablet 20 mg (20 mg Oral Given 10/27/15 2344)  metoprolol tartrate (LOPRESSOR) tablet 25 mg (25 mg Oral Given 10/27/15 2343)  amLODipine (NORVASC) tablet 10 mg (not administered)  aspirin EC tablet 81 mg (81 mg Oral Given by Other 10/27/15 2230)  pantoprazole (PROTONIX) EC tablet 40 mg (40 mg Oral Given 10/27/15 2354)  nitroGLYCERIN (NITROSTAT) SL tablet 0.4 mg (not administered)  acetaminophen (TYLENOL) tablet 650 mg (not administered)  ondansetron (ZOFRAN) injection 4 mg (not administered)  enoxaparin (LOVENOX) injection 40 mg (40 mg Subcutaneous Given 10/27/15 2344)  morphine 2 MG/ML injection 2 mg (not administered)  famotidine (PEPCID) IVPB 20 mg premix (20 mg Intravenous Given 10/27/15 2343)  aspirin chewable tablet 324 mg (324 mg Oral Given 10/27/15 1822)  ondansetron (ZOFRAN) injection 4 mg (4 mg Intravenous Given 10/27/15 2118)  nitroGLYCERIN (NITROSTAT) SL tablet 0.4 mg (0.4 mg Sublingual Given 10/27/15 2115)     Initial Impression / Assessment and Plan / ED Course  I have reviewed the triage vital signs and the nursing notes.  Pertinent labs & imaging results that were available during my care of the patient were reviewed by me and considered in my medical decision making (see chart for details).  Clinical Course   [name] [name] is a 75 y.o. male with h/o CAD s/p CABG in 1991, multiple stents, GERD, chronic LBBB, who presented to ED for evaluation of  substernal and left-sided chest pressure radiating to neck and head intermittently over the past 2 weeks, no association with exertion. Pt reports associated nausea, shortness of breath, diaphoresis. On review of medical records, admission in 07/2014 reports chronic chest pain syndrome, however pt states this pain is new. Pt has had 1 ER visit in >14 months for chest pain and no recent stress, however cardiac MRI in 07/2014 with deficit that likely is old injury from LBBB. While in Ed, developed Cp, some relief with NTG.   Needs unstable angina rule-out, though sx may be GI related. Pt reports being uncomfortable with going home, agrees with admission. Of note, his wife is on hospice and nearing the end per pt, so increased stress. This Monday with be pt's 53rd wedding anniversary with wife. Spoke to pt about staying vs leaving, pt prefers admission.  Pt condition, course, and admission were discussed with attending physician Dr. Merrily Pew.  Final Clinical Impressions(s) / ED Diagnoses   Final diagnoses:  Chest pain, unspecified type    New Prescriptions Current Discharge Medication List       Paralee Cancel, MD 10/28/15 0130    Merrily Pew, MD 10/28/15 1534

## 2015-10-28 ENCOUNTER — Encounter (HOSPITAL_COMMUNITY): Payer: Self-pay | Admitting: Physician Assistant

## 2015-10-28 ENCOUNTER — Observation Stay (HOSPITAL_COMMUNITY): Payer: Medicare Other

## 2015-10-28 DIAGNOSIS — K588 Other irritable bowel syndrome: Secondary | ICD-10-CM | POA: Diagnosis not present

## 2015-10-28 DIAGNOSIS — R140 Abdominal distension (gaseous): Secondary | ICD-10-CM

## 2015-10-28 DIAGNOSIS — R0789 Other chest pain: Secondary | ICD-10-CM | POA: Diagnosis not present

## 2015-10-28 DIAGNOSIS — K219 Gastro-esophageal reflux disease without esophagitis: Secondary | ICD-10-CM | POA: Diagnosis not present

## 2015-10-28 DIAGNOSIS — I10 Essential (primary) hypertension: Secondary | ICD-10-CM

## 2015-10-28 DIAGNOSIS — E7800 Pure hypercholesterolemia, unspecified: Secondary | ICD-10-CM

## 2015-10-28 DIAGNOSIS — I25810 Atherosclerosis of coronary artery bypass graft(s) without angina pectoris: Secondary | ICD-10-CM | POA: Diagnosis not present

## 2015-10-28 DIAGNOSIS — E860 Dehydration: Secondary | ICD-10-CM

## 2015-10-28 LAB — CBC
HCT: 36.5 % — ABNORMAL LOW (ref 39.0–52.0)
Hemoglobin: 12.4 g/dL — ABNORMAL LOW (ref 13.0–17.0)
MCH: 30.4 pg (ref 26.0–34.0)
MCHC: 34 g/dL (ref 30.0–36.0)
MCV: 89.5 fL (ref 78.0–100.0)
PLATELETS: 176 10*3/uL (ref 150–400)
RBC: 4.08 MIL/uL — ABNORMAL LOW (ref 4.22–5.81)
RDW: 13.3 % (ref 11.5–15.5)
WBC: 5.8 10*3/uL (ref 4.0–10.5)

## 2015-10-28 LAB — BASIC METABOLIC PANEL
Anion gap: 7 (ref 5–15)
BUN: 18 mg/dL (ref 6–20)
CALCIUM: 8.9 mg/dL (ref 8.9–10.3)
CO2: 26 mmol/L (ref 22–32)
CREATININE: 1.17 mg/dL (ref 0.61–1.24)
Chloride: 106 mmol/L (ref 101–111)
GFR calc non Af Amer: 59 mL/min — ABNORMAL LOW (ref >60)
GLUCOSE: 87 mg/dL (ref 65–99)
Potassium: 3.7 mmol/L (ref 3.5–5.1)
Sodium: 139 mmol/L (ref 135–145)

## 2015-10-28 LAB — HEPATIC FUNCTION PANEL
ALT: 18 U/L (ref 17–63)
AST: 22 U/L (ref 15–41)
Albumin: 3.8 g/dL (ref 3.5–5.0)
Alkaline Phosphatase: 49 U/L (ref 38–126)
BILIRUBIN DIRECT: 0.3 mg/dL (ref 0.1–0.5)
BILIRUBIN INDIRECT: 2.7 mg/dL — AB (ref 0.3–0.9)
Total Bilirubin: 3 mg/dL — ABNORMAL HIGH (ref 0.3–1.2)
Total Protein: 6.7 g/dL (ref 6.5–8.1)

## 2015-10-28 LAB — MRSA PCR SCREENING: MRSA BY PCR: NEGATIVE

## 2015-10-28 LAB — LIPASE, BLOOD: LIPASE: 38 U/L (ref 11–51)

## 2015-10-28 MED ORDER — SIMETHICONE 80 MG PO CHEW: 80.0000 mg | CHEWABLE_TABLET | Freq: Four times a day (QID) | ORAL | Status: DC | PRN

## 2015-10-28 MED ORDER — ISOSORBIDE DINITRATE 10 MG PO TABS
30.0000 mg | ORAL_TABLET | Freq: Two times a day (BID) | ORAL | Status: DC
  Administered 2015-10-28 – 2015-10-29 (×2): 30 mg via ORAL
  Filled 2015-10-28 (×2): qty 3

## 2015-10-28 MED ORDER — FAMOTIDINE 20 MG PO TABS
20.0000 mg | ORAL_TABLET | Freq: Every day | ORAL | Status: DC
  Administered 2015-10-28: 20 mg via ORAL
  Filled 2015-10-28: qty 1

## 2015-10-28 MED ORDER — HYOSCYAMINE SULFATE ER 0.375 MG PO TB12
0.3750 mg | ORAL_TABLET | Freq: Two times a day (BID) | ORAL | Status: DC | PRN
  Filled 2015-10-28: qty 1

## 2015-10-28 NOTE — Care Management Obs Status (Signed)
[facility] NOTIFICATION   Patient Details  Name: DEDERICK LIPSON MRN: [id number] Date of Birth: 02/13/1940   Medicare Observation Status Notification Given:  Yes    Lacretia Leigh, RN 10/28/2015, 11:29 AM

## 2015-10-28 NOTE — Progress Notes (Signed)
Patient Name: [name] [name] Date of Encounter: 10/28/2015  Primary Cardiologist: Kirk Ruths MD  Hospital Problem List     Principal Problem:   Chest pain Active Problems:   HYPERCHOLESTEROLEMIA, PURE   HYPERTENSION, BENIGN   CAD, ARTERY BYPASS GRAFT   Dehydration     Subjective   Mostly pain after eating over the past month. No SOB. Feeling good today.  Inpatient Medications    Scheduled Meds: . amLODipine  10 mg Oral Daily  . aspirin EC  81 mg Oral QHS  . enoxaparin (LOVENOX) injection  40 mg Subcutaneous QHS  . famotidine (PEPCID) IV  20 mg Intravenous Q12H  . gabapentin  300 mg Oral QHS  . isosorbide dinitrate  20 mg Oral BID  . metoprolol tartrate  25 mg Oral BID  . pantoprazole  40 mg Oral BID  . PARoxetine  10 mg Oral Daily   Continuous Infusions:  PRN Meds:.acetaminophen, morphine injection, nitroGLYCERIN, ondansetron (ZOFRAN) IV   Vital Signs    Vitals:   10/28/15 0400 10/28/15 0700 10/28/15 0725 10/28/15 0835  BP: (!) 101/51 130/71  123/70  Pulse: (!) 59 (!) 58 (!) 56   Resp: 14 13 15 16   Temp:    98.5 F (36.9 C)  TempSrc:    Oral  SpO2: 96% 96% 96%   Weight:      Height:        Intake/Output Summary (Last 24 hours) at 10/28/15 1048 Last data filed at 10/28/15 0100  Gross per 24 hour  Intake               50 ml  Output              200 ml  Net             -150 ml   Filed Weights   10/27/15 1707  Weight: 140 lb (63.5 kg)    Physical Exam    GEN: Well nourished, well developed, in no acute distress.  HEENT: Grossly normal.  Neck: Supple, no JVD, carotid bruits, or masses. Cardiac: RRR, no murmurs, rubs, or gallops. No clubbing, cyanosis, edema.  Radials/DP/PT 2+ and equal bilaterally.  Respiratory:  Respirations regular and unlabored, clear to auscultation bilaterally. GI: Soft, nontender, nondistended, BS + x 4. MS: no deformity or atrophy. Skin: warm and dry, no rash. Neuro:  Strength and sensation are intact. Psych: AAOx3.   Normal affect.  Labs    CBC  Recent Labs  10/27/15 1704 10/28/15 0231  WBC 6.4 5.8  HGB 14.1 12.4*  HCT 40.2 36.5*  MCV 89.1 89.5  PLT 200 0000000   Basic Metabolic Panel  Recent Labs  10/27/15 1704 10/28/15 0231  NA 138 139  K 3.6 3.7  CL 106 106  CO2 22 26  GLUCOSE 95 87  BUN 22* 18  CREATININE 1.25* 1.17  CALCIUM 9.8 8.9     Telemetry    No adverse arrhythmia - Personally Reviewed  ECG    IVCD as below - Personally Reviewed  Radiology    Dg Chest 2 View  Result Date: 10/27/2015 CLINICAL DATA:  Central chest pain, productive cough and shortness of breath for 2 weeks. EXAM: CHEST  2 VIEW COMPARISON:  08/19/2015 FINDINGS: There are postoperative changes of the mediastinum as before. There is no acute infiltrate, consolidation, or pleural effusion identified. Cardiomediastinal silhouette is stable. There is atherosclerosis of the aortic arch. No pneumothorax. Surgical clips in the right upper quadrant. Degenerative changes  of the spine. IMPRESSION: 1. No radiographic evidence for acute cardiopulmonary abnormality 2. Atherosclerosis of the aorta 3. Postsurgical changes of the mediastinum Electronically Signed   By: Donavan Foil M.D.   On: 10/27/2015 19:11     Cardiac Studies   NUC stress 2016:  Nuclear stress EF: 50%.  The left ventricular ejection fraction is mildly decreased (45-54%).  This is a low risk study.   Low risk stress nuclear study with a medium size, moderate intensity, fixed septal defect suggestive of prior infarct vs LBBB artifact; no ischemia; EF 50 with mild global hypokinesis.  Patient Profile     75 year old with CABG, worsening CP.  Assessment & Plan    Chest pain  - reassuring troponin  - may be GI related (prior stricture...)  - Stress test 08/18/14 - low risk  - IVCD on ECG with no significant change from prior.   - No further cardiac workup at this time. ECHO has been ordered.   - Increase isosorbide from 20 to 30mg   BID  CEA/PVD  - stable  GERD  - GI eval.   Signed, Candee Furbish, MD  10/28/2015, 10:48 AM

## 2015-10-28 NOTE — Consult Note (Signed)
[facility] Consult: 11:08 AM 10/28/2015  LOS: 0 days    Referring Provider: Dr Wynelle Cleveland  Primary Care Physician:  Leonides Sake, MD Primary Gastroenterologist:  Dr. Henrene Pastor    Reason for Consultation:  Post prandial chest pain   HPI: [name] [name] is a 75 y.o. male.  Hx CAD, s/p CABG 2001 and stents 2006/2011.  HTN.  PVD.  S/p CEA 2013.  S/p aorto-fem-pop bypass.   Prostate cancer, s/p cryoablation.  Chronic back painAnemia. Many medication "allergies".  Pancreatitis of unclear cause 2011.  Esophageal stricture dilated 2016.  Adenomatous colon polyp.  S/p cholecystectomy 2002.  Fatty liver.  Serum H pylori Ag + in 2011, do not see notes mentioning that this was treated and patient does not recall treatment..  08/2011 enteritis (ischemic?). Celiac panel 2013 negative.  07/2014 Esohpagram: small HH.  No recurrent stricture, normal motility.   EGDs  02/2014: GERD, distal esophageal stricture dilated 11/2008 with esophageal ring dilation.   2005: esophageal stricture (not dilated), HH. 05/2000 dilated esophageal stricture,  HH.  1996 with removal of meat impaction. Colonoscopies  02/2014: 2 small polyps (tubular adenomas).  Sigmoid tics. , 11/2008 with diminutive polyp (tubular adenoma), moderate left tics, int rrhoids.  10/2003 with polyps (tubular adenomas)and tics.  05/2000: adenomatous polyps, tics and internal rrhoids.   ERCP  12/2009.  Dr Deatra Ina.  For pancreatitis.  Normal CBD and PD.  No etiology for pancreatitis found  At time of EGDs and dilations pt's sx was chest, epigastric pain.  The pain was never relieved by dilatations. He does not have history or current symptoms of dysphagia to solids or liquids. Though he does say when he swallows he sometimes feels a sore throat.  Meds include BID Protonix.   For  at least several months months patient has had intermittent discomfort in the lower chest/epigastric region. This despite taking the twice daily Protonix in the last few weeks this has become a bit more intense and persistent.  The pain is radiating into his neck and head. It is not relieved by rest, antacids or sublingual nitroglycerin. It is not exertional nor associated with diaphoresis. Eating/drinking may exacerbate the discomfort and oriented to nausea, which lately has been occurring every day along with dry heaves. However there have been times when eating and drinking actually relieves the pain flares post prandial it will be associated with bloating. He is moving his bowels regularly. They are brown, nonbloody, non-melenic. Patient doesn't drink EtOH. He has rarely used Aleve for headaches. These headaches are somewhat chronic and mostly mild. He also complains of blurry vision. He does have some dizziness with position changes but this abates after he stands for a while. Because of the progression of the symptoms he presented to the emergency room yesterday evening.  So far 2 sets of troponins are negative.  Stable EKG changes which are not ischemic. No LFTs or Lipase assayed.  Post hydration Hgb dropped 14.1 to 12.4.   Cardiologist service's labeling this as chest pain syndrome, likely noncardiac, though they have ordered a echocardiogram.  Past Medical History:  Diagnosis Date  . Anemia 10/06/2011  . Anginal pain (HCC)   . Arthritis    "joints hurt; shoulders, arms, back" (08/13/2013)  . Atrial fibrillation (HCC)   . Carotid stenosis 04/10/11   a. s/p left carotid endarterectomy 04/14/2011.;  b.  Carotid US (11/13):  L CEA ok; RICA 1-39%  . Chronic bronchitis (HCC)    "get it q yr"  . Chronic chest pain   . Chronic lower back pain   . Colon polyp    adenomatous  . Coronary artery disease    a. S/p CABG in 2001. b. S/p DES to protected LM and BMS to RCA 2006. c. 12/2009: s/p DES to  Cx;  d. 09/2011 Cath: patent stents, patent grafts -->Med Rx.;  e. CP with abnl Nuc => LHC 10/04/12: oLM 30%, dLM stent into the CFX patent, LAD occluded, LIMA-LAD patent, RI 30%, mid AVCFX 30%, oOM1 50%, oRCA stent patent, mRCA 50-60%, Radial graft-Dx patent; EF 55%.=> Med Rx  . Daily headache    "for the last couple weeks" (08/13/2013)  . Diverticulosis   . Esophageal stricture   . GERD (gastroesophageal reflux disease)   . Hemorrhoids   . Hiatal hernia   . Hyperlipidemia   . Hypertension   . Left bundle branch block   . Pancreatitis Dec 2011   ERCP ok.  . Prostate cancer [facility](HCC)    s/p cryoablation  . Renal cyst    Seen on CT 08/2011 also with circumferential bladder wall thickening  . Skin cancer    "cut it off my right ear"  . Statin intolerance     Past Surgical History:  Procedure Laterality Date  . CARDIAC CATHETERIZATION    . CHOLECYSTECTOMY    . COLONOSCOPY    . CORONARY ANGIOPLASTY WITH STENT PLACEMENT     "1 + 1"  . CORONARY ARTERY BYPASS GRAFT  2001  . ENDARTERECTOMY  04/14/2011   Procedure: ENDARTERECTOMY CAROTID;  Surgeon: Pryor OchoaJames D Lawson, MD;  [facility];  Service: Vascular;  Laterality: Left;  Would like to perform procedure first, at 0730  . INGUINAL HERNIA REPAIR Bilateral   . LEFT HEART CATHETERIZATION WITH CORONARY/GRAFT ANGIOGRAM N/A 10/05/2011   Procedure: LEFT HEART CATHETERIZATION WITH Isabel CapriceORONARY/GRAFT ANGIOGRAM;  Surgeon: Herby Abrahamhomas D Stuckey, MD;  [facility];  Service: Cardiovascular;  Laterality: N/A;  . LEFT HEART CATHETERIZATION WITH CORONARY/GRAFT ANGIOGRAM N/A 10/04/2012   Procedure: LEFT HEART CATHETERIZATION WITH Isabel CapriceORONARY/GRAFT ANGIOGRAM;  Surgeon: Dolores Pattyaniel R Bensimhon, MD;  [facility];  Service: Cardiovascular;  Laterality: N/A;  . POLYPECTOMY    . PR VEIN BYPASS GRAFT,AORTO-FEM-POP    . PROSTATE CRYOABLATION      Prior to Admission medications   Medication Sig Start Date End Date Taking? Authorizing Provider  amLODipine  (NORVASC) 10 MG tablet Take 10 mg by mouth daily.   Yes Historical Provider, MD  aspirin EC 81 MG tablet Take 81 mg by mouth at bedtime.    Yes Historical Provider, MD  diphenhydrAMINE (BENADRYL) 25 MG tablet Take 25 mg by mouth every 8 (eight) hours as needed for allergies.   Yes Historical Provider, MD  gabapentin (NEURONTIN) 300 MG capsule Take 300 mg by mouth at bedtime.   Yes Historical Provider, MD  isosorbide dinitrate (ISORDIL) 20 MG tablet Take 20 mg by mouth 2 (two) times daily.   Yes Historical Provider, MD  meclizine (ANTIVERT) 12.5 MG tablet Take 1 tablet (12.5 mg total) by mouth 3 (three)  times daily as needed for dizziness. 09/05/15  Yes Lacretia Leigh, MD  metoprolol tartrate (LOPRESSOR) 25 MG tablet Take 0.5 tablets (12.5 mg total) by mouth 2 (two) times daily. Patient taking differently: Take 25 mg by mouth 2 (two) times daily.  08/04/14  Yes Bhavinkumar Bhagat, PA  nitroGLYCERIN (NITROSTAT) 0.4 MG SL tablet Place 1 tablet (0.4 mg total) under the tongue every 5 (five) minutes as needed. For chest pain 07/21/13  Yes Scott Joylene Draft, PA-C  pantoprazole (PROTONIX) 40 MG tablet Take 40 mg by mouth 2 (two) times daily.    Yes Historical Provider, MD  PARoxetine (PAXIL) 10 MG tablet Take 10 mg by mouth daily.   Yes Historical Provider, MD    Scheduled Meds: . amLODipine  10 mg Oral Daily  . aspirin EC  81 mg Oral QHS  . enoxaparin (LOVENOX) injection  40 mg Subcutaneous QHS  . famotidine (PEPCID) IV  20 mg Intravenous Q12H  . gabapentin  300 mg Oral QHS  . isosorbide dinitrate  30 mg Oral BID  . metoprolol tartrate  25 mg Oral BID  . pantoprazole  40 mg Oral BID  . PARoxetine  10 mg Oral Daily   Infusions:   PRN Meds: acetaminophen, morphine injection, nitroGLYCERIN, ondansetron (ZOFRAN) IV   Allergies as of 10/27/2015 - Review Complete 10/27/2015  Allergen Reaction Noted  . Amoxicillin-pot clavulanate    . Codeine Nausea And Vomiting   . Erythromycin Other (See Comments)  10/18/2011  . Hydrocodone Nausea And Vomiting 03/04/2011  . Morphine Nausea And Vomiting   . Nitrofuran derivatives Other (See Comments) 03/04/2011  . Oxycodone hcl Nausea And Vomiting   . Shellfish allergy Swelling 03/04/2011  . Tramadol Nausea And Vomiting 02/01/2010  . Zolpidem tartrate Other (See Comments)     Family History  Problem Relation Age of Onset  . Heart disease Mother     Heart Disease before age 58  . Hypertension Mother   . Heart attack Mother   . Lung cancer Father   . Hypertension Sister   . Heart disease Sister     Heart Disease before age 76  . Cancer Sister   . Heart attack Sister   . Hypertension Brother   . Heart disease Brother     Heart Disease before age 26  . Heart attack Brother   . Colon cancer Neg Hx     Social History   Social History  . Marital status: Married    Spouse name: N/A  . Number of children: 2  . Years of education: N/A   Occupational History  . Retired    Social History Main Topics  . Smoking status: Former Smoker    Years: 0.10    Types: Cigarettes  . Smokeless tobacco: Current User    Types: Chew     Comment: "smoked a few cigarettes; no more than 1 month"  . Alcohol use No     Comment: "no alcohol since I was a teenager"  . Drug use: No  . Sexual activity: No   Other Topics Concern  . Not on file   Social History Narrative   Patient is illiterate. He cannot read or write. He left school at about the seventh grade.   As of 10/2015 he reports that his wife is chronically ill at home with heart disease and COPD and is under hospice care. The bulk of the care is given by the patient and daughter.    REVIEW OF SYSTEMS: Constitutional:  Stable weight. No fatigue ENT:  No nose bleeds Pulm:  Stable dyspnea on exertion. Occasional mild cough. Never smoked. CV:  No palpitations, no LE edema.  GU:  No hematuria, no frequency GI:  Per HPI Heme:  No significant bleeding or bruising issues.   Transfusions:  He does  not recall previous transfusions although he can't be sure that he didn't get some when he underwent his CABG. Neuro: + mild headaches, no peripheral tingling or numbness Derm:  No itching, no rash or sores.  Endocrine:  No sweats or chills.  No polyuria or dysuria Immunization:  Did not inquire Travel:  None beyond local counties in last few months.    PHYSICAL EXAM: Vital signs in last 24 hours: Vitals:   10/28/15 0725 10/28/15 0835  BP:  123/70  Pulse: (!) 56   Resp: 15 16  Temp:  98.5 F (36.9 C)   Wt Readings from Last 3 Encounters:  10/27/15 63.5 kg (140 lb)  08/20/15 62.6 kg (138 lb)  09/22/14 62.5 kg (137 lb 12.8 oz)    General: Pleasant, small statured, healthy-appearing, comfortable elderly WM Head:  No signs of head trauma. No facial edema, no asymmetry.  Eyes:  No scleral icterus, no conjunctival pallor. Ears:  Not HOH.  Nose:  No congestion or discharge Mouth:  Moist, clear. Edentulous. Tongue midline. Neck:  Not tender, no masses, no thyromegaly, no JVD. Lungs:  Clear bilaterally. No labored breathing, no cough. Heart: RRR. No MRG. S1, S2 present Abdomen:  Soft. Not obese. NT. ND. No HSM or masses. No hernias. No bruits.   Rectal: Deferred   Musc/Skeltl: No joint erythema, swelling or contracture deformities. Extremities:  No CCE.  Neurologic:  Patient is alert, oriented 3. He moves all 4 limbs easily, strength was not tested. There is no tremor. No gross neurologic deficits. Skin:  No worrisome rash, sores or other lesions. Tattoos:  None Nodes:  No cervical adenopathy   Psych:  Pleasant, calm, cooperative.  Intake/Output from previous day: 10/04 0701 - 10/05 0700 In: 50 [IV Piggyback:50] Out: 200 [Urine:200] Intake/Output this shift: No intake/output data recorded.  LAB RESULTS:  Recent Labs  10/27/15 1704 10/28/15 0231  WBC 6.4 5.8  HGB 14.1 12.4*  HCT 40.2 36.5*  PLT 200 176   BMET Lab Results  Component Value Date   NA 139 10/28/2015    NA 138 10/27/2015   NA 135 09/05/2015   K 3.7 10/28/2015   K 3.6 10/27/2015   K 4.5 09/05/2015   CL 106 10/28/2015   CL 106 10/27/2015   CL 105 09/05/2015   CO2 26 10/28/2015   CO2 22 10/27/2015   CO2 25 09/05/2015   GLUCOSE 87 10/28/2015   GLUCOSE 95 10/27/2015   GLUCOSE 116 (H) 09/05/2015   BUN 18 10/28/2015   BUN 22 (H) 10/27/2015   BUN 16 09/05/2015   CREATININE 1.17 10/28/2015   CREATININE 1.25 (H) 10/27/2015   CREATININE 1.32 (H) 09/05/2015   CALCIUM 8.9 10/28/2015   CALCIUM 9.8 10/27/2015   CALCIUM 9.1 09/05/2015   LFT No results for input(s): PROT, ALBUMIN, AST, ALT, ALKPHOS, BILITOT, BILIDIR, IBILI in the last 72 hours. PT/INR Lab Results  Component Value Date   INR 0.92 08/19/2015   INR 0.98 08/13/2013   INR 1.03 10/03/2012   Hepatitis Panel No results for input(s): HEPBSAG, HCVAB, HEPAIGM, HEPBIGM in the last 72 hours. C-Diff No components found for: CDIFF   Drugs of Abuse  No results found  for: LABOPIA, COCAINSCRNUR, LABBENZ, AMPHETMU, THCU, LABBARB   RADIOLOGY STUDIES: Dg Chest 2 View  Result Date: 10/27/2015 CLINICAL DATA:  Central chest pain, productive cough and shortness of breath for 2 weeks. EXAM: CHEST  2 VIEW COMPARISON:  08/19/2015 FINDINGS: There are postoperative changes of the mediastinum as before. There is no acute infiltrate, consolidation, or pleural effusion identified. Cardiomediastinal silhouette is stable. There is atherosclerosis of the aortic arch. No pneumothorax. Surgical clips in the right upper quadrant. Degenerative changes of the spine. IMPRESSION: 1. No radiographic evidence for acute cardiopulmonary abnormality 2. Atherosclerosis of the aorta 3. Postsurgical changes of the mediastinum Electronically Signed   By: Donavan Foil M.D.   On: 10/27/2015 19:11    ENDOSCOPIC STUDIES: See  HPI   IMPRESSION:   *  Chronic epigastric/chest pain despite twice a day Protonix. No particular pattern. Agree this does not sound cardiac  though he had does have history of CABG and coronary stenting. History of esophageal strictures that have not caused significant dysphagia although he has remote history of food impaction.  The bulk of his EGDs have been done for complaint of epigastric/chest pain which is not relieved following esophageal stricture/ring dilatation. Cardiac troponins negative 2.     PLAN:     *  Ordered serum H. pylori antibody, LFTs and lipase  *  Cardiology has ordered a 2-D echo.  *  Dr Silverio Decamp to round and decide about repeat EGD   Azucena Freed  10/28/2015, 11:08 AM Pager: [phone]

## 2015-10-28 NOTE — Progress Notes (Addendum)
[facility]  75 y/o male admitted by my colleague this AM for chest pain. On and off substernal chest pain radiating to his neck for at least 1 month becoming more frequent now and mostly occurring after eating. Feels like a spasm. No exertional component. Can sometimes become clammy and short of breath with the pain. No vomiting, heart burn, odynophagia, dysphagia or regurgitation. H/o hiatal hernia and esophageal dilatation in 2/16. Has been taking his PPI BID. H/o CAD with CABG and stents.   Principal Problem:   Chest pain - cardiac vs GI- etiology seems more GI related per history - Troponin negative thus far- EKG unrevealing- cardiology has evaluated patient, f/u on ECHO - check LFTs, no abdominal tenderness, GI consulted  Active Problems:    Dehydration/ renal insufficiency - resolved    HYPERTENSION, BENIGN - Norvasc, Lopressor    CAD, ARTERY BYPASS GRAFT w/ Stent to LM and RCA 9/14 - Aspirin  Prostate CA s/p cryoablation  CAD s/p CEA  Debbe Odea, MD Pager: [phone]

## 2015-10-29 ENCOUNTER — Other Ambulatory Visit (HOSPITAL_COMMUNITY): Payer: Medicare Other

## 2015-10-29 DIAGNOSIS — R0789 Other chest pain: Secondary | ICD-10-CM | POA: Diagnosis not present

## 2015-10-29 DIAGNOSIS — I10 Essential (primary) hypertension: Secondary | ICD-10-CM | POA: Diagnosis not present

## 2015-10-29 DIAGNOSIS — N179 Acute kidney failure, unspecified: Secondary | ICD-10-CM

## 2015-10-29 DIAGNOSIS — E860 Dehydration: Secondary | ICD-10-CM | POA: Diagnosis not present

## 2015-10-29 LAB — H PYLORI, IGM, IGG, IGA AB
H Pylori IgG: <0.9 U/mL (ref 0.0–0.8)
H. Pylogi, Iga Abs: <9 units (ref 0.0–8.9)

## 2015-10-29 MED ORDER — ISOSORBIDE DINITRATE 30 MG PO TABS: 30.0000 mg | ORAL_TABLET | Freq: Two times a day (BID) | ORAL | 0 refills | Status: DC

## 2015-10-29 MED ORDER — FAMOTIDINE 20 MG PO TABS: 20.0000 mg | ORAL_TABLET | Freq: Every day | ORAL | 0 refills | Status: DC

## 2015-10-29 MED ORDER — METOPROLOL TARTRATE 25 MG PO TABS: 25.0000 mg | ORAL_TABLET | Freq: Two times a day (BID) | ORAL | Status: DC

## 2015-10-29 NOTE — Progress Notes (Signed)
D/c instructions discussed with pt and his family. All questions answered, and they verbalized understanding. Pt d/c home with family.

## 2015-10-29 NOTE — Discharge Instructions (Signed)

## 2015-10-29 NOTE — Discharge Summary (Addendum)
Physician Discharge Summary  [name] [name] [id number] DOB: [birth year] DOA: 10/27/2015  PCP: Leonides Sake, MD  Admit date: 10/27/2015 Discharge date: 10/29/2015  Admitted From: home  Disposition:  home   Recommendations for Outpatient Follow-up:  1. GI follow up  Home Health:  none  Equipment/Devices:  none    Discharge Condition:  stable   CODE STATUS:  Full code   Diet recommendation:  Heart heatlhy Consultations:  GI  Cardiology    Discharge Diagnoses:  Principal Problem:   Chest pain Active Problems:   AKI (acute kidney injury) ([place])   HYPERCHOLESTEROLEMIA, PURE   HYPERTENSION, BENIGN   CAD, ARTERY BYPASS GRAFT   Dehydration    Subjective: He had some sub sternal pain last night which was mild. No new complaints.   Brief Summary: 74 y/o male admitted by my colleague this AM for chest pain. On and off substernal chest pain radiating to his neck for at least 1 month becoming more frequent now and mostly occurring after eating. Feels like a spasm. No exertional component. Can sometimes become clammy and short of breath with the pain. No vomiting, heart burn, odynophagia, dysphagia or regurgitation. H/o hiatal hernia and esophageal dilatation in 2/16. Has been taking his PPI BID. H/o CAD with CABG and stents.   Hospital Course:  Chest pain  - cardiac vs GI- etiology seems more GI related per history-  Possible Esophageal spasms? - Troponin negative - EKG unrevealing- cardiology has evaluated patient and have recommended increasing Imdur from 20 to 30 BID- no further work up per cardiology - checked LFTs and Lipase which were normal except for an elevated T bili- he has had a cholecystectomy - GI has evaluated the patient - he had a full GI work up in 2016 and does not need further procedures- recommending H2 blocker at bedtime in addition to the BID Protonix he is already taking- they will f/u as outpt  Active Problems:    Dehydration/ renal insufficiency -  resolved    HYPERTENSION, BENIGN - Norvasc, Lopressor    CAD, ARTERY BYPASS GRAFT w/ Stent to LM and RCA 9/14 - will need to continue Aspirin  Prostate CA s/p cryoablation  S/p left Carotid endarterectomy   Discharge Instructions  Discharge Instructions    Diet - low sodium heart healthy    Complete by:  As directed    Increase activity slowly    Complete by:  As directed        Medication List    TAKE these medications   amLODipine 10 MG tablet Commonly known as:  NORVASC Take 10 mg by mouth daily.   aspirin EC 81 MG tablet Take 81 mg by mouth at bedtime.   diphenhydrAMINE 25 MG tablet Commonly known as:  BENADRYL Take 25 mg by mouth every 8 (eight) hours as needed for allergies.   famotidine 20 MG tablet Commonly known as:  PEPCID Take 1 tablet (20 mg total) by mouth at bedtime.   gabapentin 300 MG capsule Commonly known as:  NEURONTIN Take 300 mg by mouth at bedtime.   isosorbide dinitrate 30 MG tablet Commonly known as:  ISORDIL Take 1 tablet (30 mg total) by mouth 2 (two) times daily. What changed:  medication strength  how much to take   meclizine 12.5 MG tablet Commonly known as:  ANTIVERT Take 1 tablet (12.5 mg total) by mouth 3 (three) times daily as needed for dizziness.   metoprolol tartrate 25 MG tablet Commonly known as:  LOPRESSOR  Take 1 tablet (25 mg total) by mouth 2 (two) times daily.   nitroGLYCERIN 0.4 MG SL tablet Commonly known as:  NITROSTAT Place 1 tablet (0.4 mg total) under the tongue every 5 (five) minutes as needed. For chest pain   pantoprazole 40 MG tablet Commonly known as:  PROTONIX Take 40 mg by mouth 2 (two) times daily.   PARoxetine 10 MG tablet Commonly known as:  PAXIL Take 10 mg by mouth daily.      Follow-up Information    Leonides Sake, MD On 11/10/2015.   Specialty:  Family Medicine Why:  @ 3:30pm Contact information: [place] [address] [phone]           Allergies  Allergen Reactions  . Amoxicillin-Pot Clavulanate     REACTION: 'Burns' Stomach  . Codeine Nausea And Vomiting  . Erythromycin Other (See Comments)    All mycins cause upset stomach  . Hydrocodone Nausea And Vomiting  . Morphine Nausea And Vomiting  . Nitrofuran Derivatives Other (See Comments)    unknown  . Oxycodone Hcl Nausea And Vomiting  . Shellfish Allergy Swelling  . Tramadol Nausea And Vomiting  . Zolpidem Tartrate Other (See Comments)    hallucinations     Procedures/Studies:   Dg Chest 2 View  Result Date: 10/27/2015 CLINICAL DATA:  Central chest pain, productive cough and shortness of breath for 2 weeks. EXAM: CHEST  2 VIEW COMPARISON:  08/19/2015 FINDINGS: There are postoperative changes of the mediastinum as before. There is no acute infiltrate, consolidation, or pleural effusion identified. Cardiomediastinal silhouette is stable. There is atherosclerosis of the aortic arch. No pneumothorax. Surgical clips in the right upper quadrant. Degenerative changes of the spine. IMPRESSION: 1. No radiographic evidence for acute cardiopulmonary abnormality 2. Atherosclerosis of the aorta 3. Postsurgical changes of the mediastinum Electronically Signed   By: Donavan Foil M.D.   On: 10/27/2015 19:11       Discharge Exam: Vitals:   10/29/15 0551 10/29/15 0950  BP: 127/60 139/60  Pulse: (!) 57 61  Resp: 20   Temp: 98.1 F (36.7 C)    Vitals:   10/28/15 1430 10/28/15 2118 10/29/15 0551 10/29/15 0950  BP: (!) 151/56 133/60 127/60 139/60  Pulse: (!) 57 (!) 55 (!) 57 61  Resp: 16 20 20    Temp: 97.7 F (36.5 C) 98.7 F (37.1 C) 98.1 F (36.7 C)   TempSrc: Oral Oral Oral   SpO2: 97% 97% 99%   Weight: 61.5 kg (135 lb 9.6 oz)  61.7 kg (136 lb)   Height: 5\' 4"  (1.626 m)       General: Pt is alert, awake, not in acute distress Cardiovascular: RRR, S1/S2 +, no rubs, no gallops Respiratory: CTA bilaterally, no wheezing, no rhonchi Abdominal: Soft, NT, ND,  bowel sounds + Extremities: no edema, no cyanosis    The results of significant diagnostics from this hospitalization (including imaging, microbiology, ancillary and laboratory) are listed below for reference.     Microbiology: Recent Results (from the past 240 hour(s))  MRSA PCR Screening     Status: None   Collection Time: 10/27/15 10:53 PM  Result Value Ref Range Status   MRSA by PCR NEGATIVE NEGATIVE Final    Comment:        The GeneXpert MRSA Assay (FDA approved for NASAL specimens only), is one component of a comprehensive MRSA colonization surveillance program. It is not intended to diagnose MRSA infection nor to guide or monitor treatment for MRSA  infections.      Labs: BNP (last 3 results) No results for input(s): BNP in the last 8760 hours. Basic Metabolic Panel:  Recent Labs Lab 10/27/15 1704 10/28/15 0231  NA 138 139  K 3.6 3.7  CL 106 106  CO2 22 26  GLUCOSE 95 87  BUN 22* 18  CREATININE 1.25* 1.17  CALCIUM 9.8 8.9   Liver Function Tests:  Recent Labs Lab 10/28/15 1041  AST 22  ALT 18  ALKPHOS 49  BILITOT 3.0*  PROT 6.7  ALBUMIN 3.8    Recent Labs Lab 10/28/15 1041  LIPASE 38   No results for input(s): AMMONIA in the last 168 hours. CBC:  Recent Labs Lab 10/27/15 1704 10/28/15 0231  WBC 6.4 5.8  HGB 14.1 12.4*  HCT 40.2 36.5*  MCV 89.1 89.5  PLT 200 176   Cardiac Enzymes: No results for input(s): CKTOTAL, CKMB, CKMBINDEX, TROPONINI in the last 168 hours. BNP: Invalid input(s): POCBNP CBG: No results for input(s): GLUCAP in the last 168 hours. D-Dimer No results for input(s): DDIMER in the last 72 hours. Hgb A1c No results for input(s): HGBA1C in the last 72 hours. Lipid Profile No results for input(s): CHOL, HDL, LDLCALC, TRIG, CHOLHDL, LDLDIRECT in the last 72 hours. Thyroid function studies No results for input(s): TSH, T4TOTAL, T3FREE, THYROIDAB in the last 72 hours.  Invalid input(s): FREET3 Anemia work  up No results for input(s): VITAMINB12, FOLATE, FERRITIN, TIBC, IRON, RETICCTPCT in the last 72 hours. Urinalysis    Component Value Date/Time   COLORURINE YELLOW 09/05/2015 [place] 09/05/2015 1247   LABSPEC 1.019 09/05/2015 1247   PHURINE 5.0 09/05/2015 1247   GLUCOSEU NEGATIVE 09/05/2015 1247   HGBUR NEGATIVE 09/05/2015 1247   BILIRUBINUR NEGATIVE 09/05/2015 1247   KETONESUR NEGATIVE 09/05/2015 1247   PROTEINUR NEGATIVE 09/05/2015 1247   UROBILINOGEN 0.2 02/08/2014 1315   NITRITE NEGATIVE 09/05/2015 1247   LEUKOCYTESUR NEGATIVE 09/05/2015 1247   Sepsis Labs Invalid input(s): PROCALCITONIN,  WBC,  LACTICIDVEN Microbiology Recent Results (from the past 240 hour(s))  MRSA PCR Screening     Status: None   Collection Time: 10/27/15 10:53 PM  Result Value Ref Range Status   MRSA by PCR NEGATIVE NEGATIVE Final    Comment:        The GeneXpert MRSA Assay (FDA approved for NASAL specimens only), is one component of a comprehensive MRSA colonization surveillance program. It is not intended to diagnose MRSA infection nor to guide or monitor treatment for MRSA infections.      Time coordinating discharge: Over 30 minutes  SIGNED:   Debbe Odea, MD  [facility] 10/29/2015, 10:49 AM Pager   If 7PM-7AM, please contact night-coverage [email or web address] Password [email or web address]

## 2015-12-23 DIAGNOSIS — Z6823 Body mass index (BMI) 23.0-23.9, adult: Secondary | ICD-10-CM | POA: Diagnosis not present

## 2015-12-23 DIAGNOSIS — R079 Chest pain, unspecified: Secondary | ICD-10-CM | POA: Diagnosis not present

## 2015-12-23 DIAGNOSIS — I2510 Atherosclerotic heart disease of native coronary artery without angina pectoris: Secondary | ICD-10-CM | POA: Diagnosis not present

## 2015-12-23 DIAGNOSIS — K219 Gastro-esophageal reflux disease without esophagitis: Secondary | ICD-10-CM | POA: Diagnosis not present

## 2015-12-23 DIAGNOSIS — F419 Anxiety disorder, unspecified: Secondary | ICD-10-CM | POA: Diagnosis not present

## 2016-01-14 DIAGNOSIS — J0100 Acute maxillary sinusitis, unspecified: Secondary | ICD-10-CM | POA: Diagnosis not present

## 2016-01-20 DIAGNOSIS — H04123 Dry eye syndrome of bilateral lacrimal glands: Secondary | ICD-10-CM | POA: Diagnosis not present

## 2016-01-20 DIAGNOSIS — H524 Presbyopia: Secondary | ICD-10-CM | POA: Diagnosis not present

## 2016-01-20 DIAGNOSIS — H16223 Keratoconjunctivitis sicca, not specified as Sjogren's, bilateral: Secondary | ICD-10-CM | POA: Diagnosis not present

## 2016-01-20 DIAGNOSIS — H40023 Open angle with borderline findings, high risk, bilateral: Secondary | ICD-10-CM | POA: Diagnosis not present

## 2016-01-20 DIAGNOSIS — H25043 Posterior subcapsular polar age-related cataract, bilateral: Secondary | ICD-10-CM | POA: Diagnosis not present

## 2016-01-20 DIAGNOSIS — H2513 Age-related nuclear cataract, bilateral: Secondary | ICD-10-CM | POA: Diagnosis not present

## 2016-03-08 DIAGNOSIS — H2513 Age-related nuclear cataract, bilateral: Secondary | ICD-10-CM | POA: Diagnosis not present

## 2016-03-08 DIAGNOSIS — H2512 Age-related nuclear cataract, left eye: Secondary | ICD-10-CM | POA: Diagnosis not present

## 2016-03-10 DIAGNOSIS — Z1389 Encounter for screening for other disorder: Secondary | ICD-10-CM | POA: Diagnosis not present

## 2016-03-10 DIAGNOSIS — Z9181 History of falling: Secondary | ICD-10-CM | POA: Diagnosis not present

## 2016-03-10 DIAGNOSIS — I10 Essential (primary) hypertension: Secondary | ICD-10-CM | POA: Diagnosis not present

## 2016-03-10 DIAGNOSIS — F419 Anxiety disorder, unspecified: Secondary | ICD-10-CM | POA: Diagnosis not present

## 2016-03-10 DIAGNOSIS — E7800 Pure hypercholesterolemia, unspecified: Secondary | ICD-10-CM | POA: Diagnosis not present

## 2016-03-10 DIAGNOSIS — Z79899 Other long term (current) drug therapy: Secondary | ICD-10-CM | POA: Diagnosis not present

## 2016-03-10 DIAGNOSIS — I2510 Atherosclerotic heart disease of native coronary artery without angina pectoris: Secondary | ICD-10-CM | POA: Diagnosis not present

## 2016-03-10 DIAGNOSIS — Z6824 Body mass index (BMI) 24.0-24.9, adult: Secondary | ICD-10-CM | POA: Diagnosis not present

## 2016-03-10 DIAGNOSIS — K219 Gastro-esophageal reflux disease without esophagitis: Secondary | ICD-10-CM | POA: Diagnosis not present

## 2016-03-21 DIAGNOSIS — H2512 Age-related nuclear cataract, left eye: Secondary | ICD-10-CM | POA: Diagnosis not present

## 2016-03-21 DIAGNOSIS — Z961 Presence of intraocular lens: Secondary | ICD-10-CM | POA: Diagnosis not present

## 2016-03-28 DIAGNOSIS — Z961 Presence of intraocular lens: Secondary | ICD-10-CM | POA: Diagnosis not present

## 2016-03-28 DIAGNOSIS — H2511 Age-related nuclear cataract, right eye: Secondary | ICD-10-CM | POA: Diagnosis not present

## 2016-04-11 ENCOUNTER — Telehealth: Payer: Self-pay

## 2016-04-11 DIAGNOSIS — I6523 Occlusion and stenosis of bilateral carotid arteries: Secondary | ICD-10-CM

## 2016-04-11 DIAGNOSIS — Z48812 Encounter for surgical aftercare following surgery on the circulatory system: Secondary | ICD-10-CM

## 2016-04-11 NOTE — Telephone Encounter (Signed)
Phone call from pt's daughter.  Reported the pt. has c/o dizziness, has had a recent fall, and has stated he feels different in his head.  Pt. spoke with nurse at this time.  Reported the dizziness has been going on for about a year or longer.  Reported one fall yesterday, and said he injured his left leg and shoulder.  Denied significant injury, and stated he didn't go to the doctor for eval., after the fall.  Denied weakness of extremities or facial droop. Reported he has some forgetfulness.  Reported most of his problem can be at night while driving; "sometimes I just don't know where I'm going for a little while, then it will come to me."  Stated he will occasionally have trouble saying what he is trying to say.  Reported recent cataract surgery, so stated vision is blurred.  Denied any episodes of temp. vision loss.  Advised will have a scheduler call back with appt. Info.  Encouraged pt. To let his medical MD know about the dizziness and recent fall.  Pt. stated he just saw his PCP about 2 weeks ago.  Noted that pt. had canceled several previous appts. for Carotid surveillance.

## 2016-04-12 NOTE — Telephone Encounter (Signed)
Sched lab 05/03/16 at 8:00 and MD 05/05/16 at 12:00. Lm on hm# to inform pt of appts.

## 2016-04-27 ENCOUNTER — Encounter: Payer: Self-pay | Admitting: Vascular Surgery

## 2016-05-03 ENCOUNTER — Ambulatory Visit (HOSPITAL_COMMUNITY)
Admission: RE | Admit: 2016-05-03 | Discharge: 2016-05-03 | Disposition: A | Discharge status: Home / Self Care | Payer: Medicare Other | Source: Ambulatory Visit | Attending: Vascular Surgery | Admitting: Vascular Surgery

## 2016-05-03 DIAGNOSIS — Z48812 Encounter for surgical aftercare following surgery on the circulatory system: Secondary | ICD-10-CM

## 2016-05-03 DIAGNOSIS — I6521 Occlusion and stenosis of right carotid artery: Secondary | ICD-10-CM | POA: Insufficient documentation

## 2016-05-03 DIAGNOSIS — I6523 Occlusion and stenosis of bilateral carotid arteries: Secondary | ICD-10-CM

## 2016-05-03 LAB — VAS US CAROTID
LCCADDIAS: 14 cm/s
LCCADSYS: 109 cm/s
LEFT ECA DIAS: -13 cm/s
LICADDIAS: -15 cm/s
LICAPDIAS: -13 cm/s
Left CCA prox dias: 16 cm/s
Left CCA prox sys: 91 cm/s
Left ICA dist sys: -54 cm/s
Left ICA prox sys: -50 cm/s
RCCAPSYS: 89 cm/s
RIGHT CCA MID DIAS: 19 cm/s
RIGHT ECA DIAS: -8 cm/s
Right CCA prox dias: 18 cm/s
Right cca dist sys: -73 cm/s

## 2016-05-05 ENCOUNTER — Encounter: Payer: Self-pay | Admitting: Vascular Surgery

## 2016-05-05 ENCOUNTER — Ambulatory Visit (INDEPENDENT_AMBULATORY_CARE_PROVIDER_SITE_OTHER): Payer: Medicare Other | Admitting: Vascular Surgery

## 2016-05-05 VITALS — BP 148/83 | HR 60 | Temp 97.0°F | Resp 16 | Ht 64.0 in | Wt 145.0 lb

## 2016-05-05 DIAGNOSIS — I6523 Occlusion and stenosis of bilateral carotid arteries: Secondary | ICD-10-CM

## 2016-05-05 NOTE — Progress Notes (Signed)
Patient ID: [name] [name], male   DOB: [birth year], 76 y.o.   MRN: [id number]  Reason for Consult: Carotid (Carotid bilat.  C/o dizziness of approx 1 yr, hx L CEA 3/13.)   Referred by Hamrick, Lorin Mercy, MD  Subjective:     HPI:  [name] [name] is a 76 y.o. male history of left carotid endarterectomy for was considered symptomatic disease by Dr. Kellie Simmering in 2013. He has not had any stroke, TIA or amaurosis since. He is a nonsmoker for lifetime but does use chewing tobacco. He has not had other vascular surgery. He does take aspirin daily. He does have pain in his legs that he thinks is from his back pain and is unchanged. He has no tissue loss or ulceration of his bilateral lower extremities. He presents today with carotid duplex for evaluation of his previous carotid endarterectomy site as well as his contralateral carotid.  Past Medical History:  Diagnosis Date  . Anemia 10/06/2011  . Anginal pain ([place])   . Arthritis    "joints hurt; shoulders, arms, back" (08/13/2013)  . Atrial fibrillation ([place])   . Carotid stenosis 04/10/11   a. s/p left carotid endarterectomy 04/14/2011.;  b.  Carotid US (11/13):  L CEA ok; RICA 1-39%  . Chronic bronchitis ([place])    "get it q yr"  . Chronic chest pain   . Chronic lower back pain   . Colon polyp    adenomatous  . Coronary artery disease    a. S/p CABG in 2001. b. S/p DES to protected LM and BMS to RCA 2006. c. 12/2009: s/p DES to Cx;  d. 09/2011 Cath: patent stents, patent grafts -->Med Rx.;  e. CP with abnl Nuc => LHC 10/04/12: oLM 30%, dLM stent into the CFX patent, LAD occluded, LIMA-LAD patent, RI 30%, mid AVCFX 30%, oOM1 50%, oRCA stent patent, mRCA 50-60%, Radial graft-Dx patent; EF 55%.=> Med Rx  . Daily headache    "for the last couple weeks" (08/13/2013)  . Diverticulosis   . Esophageal stricture   . GERD (gastroesophageal reflux disease)   . Hemorrhoids   . Hiatal hernia   . Hyperlipidemia   . Hypertension   . Left bundle branch block   .  Pancreatitis Dec 2011   ERCP ok.  . Prostate cancer [facility])    s/p cryoablation  . Renal cyst    Seen on CT 08/2011 also with circumferential bladder wall thickening  . Skin cancer    "cut it off my right ear"  . Statin intolerance    Family History  Problem Relation Age of Onset  . Heart disease Mother     Heart Disease before age 68  . Hypertension Mother   . Heart attack Mother   . Lung cancer Father   . Hypertension Sister   . Heart disease Sister     Heart Disease before age 34  . Cancer Sister   . Heart attack Sister   . Hypertension Brother   . Heart disease Brother     Heart Disease before age 75  . Heart attack Brother   . Colon cancer Neg Hx    Past Surgical History:  Procedure Laterality Date  . CARDIAC CATHETERIZATION    . CHOLECYSTECTOMY    . COLONOSCOPY    . CORONARY ANGIOPLASTY WITH STENT PLACEMENT     "1 + 1"  . CORONARY ARTERY BYPASS GRAFT  2001  . ENDARTERECTOMY  04/14/2011   Procedure: ENDARTERECTOMY CAROTID;  Surgeon:  Mal Misty, MD;  Location: [place];  Service: Vascular;  Laterality: Left;  Would like to perform procedure first, at 0730  . INGUINAL HERNIA REPAIR Bilateral   . LEFT HEART CATHETERIZATION WITH CORONARY/GRAFT ANGIOGRAM N/A 10/05/2011   Procedure: LEFT HEART CATHETERIZATION WITH Beatrix Fetters;  Surgeon: Hillary Bow, MD;  Location: [facility] CATH LAB;  Service: Cardiovascular;  Laterality: N/A;  . LEFT HEART CATHETERIZATION WITH CORONARY/GRAFT ANGIOGRAM N/A 10/04/2012   Procedure: LEFT HEART CATHETERIZATION WITH Beatrix Fetters;  Surgeon: Jolaine Artist, MD;  [facility];  Service: Cardiovascular;  Laterality: N/A;  . POLYPECTOMY    . PR VEIN BYPASS GRAFT,AORTO-FEM-POP    . PROSTATE CRYOABLATION      Short Social History:  Social History  Substance Use Topics  . Smoking status: Former Smoker    Years: 0.10    Types: Cigarettes  . Smokeless tobacco: Current User    Types: Chew     Comment: "smoked a  few cigarettes; no more than 1 month"  . Alcohol use No     Comment: "no alcohol since I was a teenager"    Allergies  Allergen Reactions  . Amoxicillin-Pot Clavulanate     REACTION: 'Burns' Stomach  . Codeine Nausea And Vomiting  . Erythromycin Other (See Comments)    All mycins cause upset stomach  . Hydrocodone Nausea And Vomiting  . Morphine Nausea And Vomiting  . Nitrofuran Derivatives Other (See Comments)    unknown  . Oxycodone Hcl Nausea And Vomiting  . Shellfish Allergy Swelling  . Tramadol Nausea And Vomiting  . Zolpidem Tartrate Other (See Comments)    hallucinations    Current Outpatient Prescriptions  Medication Sig Dispense Refill  . amLODipine (NORVASC) 10 MG tablet Take 10 mg by mouth daily.    Marland Kitchen aspirin EC 81 MG tablet Take 81 mg by mouth at bedtime.     . diphenhydrAMINE (BENADRYL) 25 MG tablet Take 25 mg by mouth every 8 (eight) hours as needed for allergies.    . famotidine (PEPCID) 20 MG tablet Take 1 tablet (20 mg total) by mouth at bedtime. 30 tablet 0  . gabapentin (NEURONTIN) 300 MG capsule Take 300 mg by mouth at bedtime.    . isosorbide dinitrate (ISORDIL) 30 MG tablet Take 1 tablet (30 mg total) by mouth 2 (two) times daily. 60 tablet 0  . meclizine (ANTIVERT) 12.5 MG tablet Take 1 tablet (12.5 mg total) by mouth 3 (three) times daily as needed for dizziness. 30 tablet 0  . metoprolol tartrate (LOPRESSOR) 25 MG tablet Take 1 tablet (25 mg total) by mouth 2 (two) times daily. (Patient taking differently: Take 50 mg by mouth 2 (two) times daily. )    . nitroGLYCERIN (NITROSTAT) 0.4 MG SL tablet Place 1 tablet (0.4 mg total) under the tongue every 5 (five) minutes as needed. For chest pain 25 tablet 3  . pantoprazole (PROTONIX) 40 MG tablet Take 40 mg by mouth 2 (two) times daily.     Marland Kitchen PARoxetine (PAXIL) 10 MG tablet Take 10 mg by mouth daily.     No current facility-administered medications for this visit.     Review of Systems  Eyes: Positive for  loss of vision.   Respiratory: Positive for cough, shortness of breath and wheezing.  Cardiovascular: Positive for chest pain and dyspnea with exertion.  Musculoskeletal: Positive for leg pain and joint pain.  Neurological: Positive for focal weakness and numbness.  Psychiatric: Positive for depressed mood.  Objective:  Objective   Vitals:   05/05/16 1201 05/05/16 1210  BP: (!) 156/76 (!) 148/83  Pulse: 60   Resp: 16   Temp: 97 F (36.1 C)   TempSrc: Oral   SpO2: 97%   Weight: 145 lb (65.8 kg)   Height: 5\' 4"  (1.626 m)    Body mass index is 24.89 kg/m.  Physical Exam  Constitutional: He appears well-developed.  HENT:  Head: Normocephalic.  Eyes: Pupils are equal, round, and reactive to light.  Neck: Normal range of motion.  Cardiovascular: Regular rhythm.   Pulses:      Carotid pulses are 2+ on the right side, and 2+ on the left side.      Radial pulses are 2+ on the right side, and 2+ on the left side.       Dorsalis pedis pulses are 2+ on the right side, and 2+ on the left side.  Abdominal: Soft. He exhibits no mass.  Musculoskeletal: He exhibits no edema.  Neurological: He is alert.  Skin: Skin is warm and dry.  Psychiatric: He has a normal mood and affect. His behavior is normal. Judgment and thought content normal.    Data: I independently interpreted his carotid artery duplexes today which demonstrated 1-39% stenosis on the right and a patent left internal carotid artery endarterectomy site.     Assessment/Plan:     76 year old previous left carotid endarterectomy by Dr. Kellie Simmering returns today after being lost to follow-up for some time. He is a lifelong nonsmoker is never had stroke TIA or amaurosis and has low grade stenosis of his right internal carotid on today's exam. We will follow him up in 1 year with repeat carotid duplex which time he can likely moved to 2 year follow-up. He will continue aspirin therapy.     Waynetta Sandy  MD [facility]

## 2016-05-09 NOTE — Addendum Note (Signed)
Addended by: Lianne Cure A on: 05/09/2016 09:01 AM   Modules accepted: Orders

## 2016-08-07 ENCOUNTER — Emergency Department (HOSPITAL_COMMUNITY)
Admission: EM | Admit: 2016-08-07 | Discharge: 2016-08-07 | Disposition: A | Discharge status: Home / Self Care | Payer: Medicare Other | Attending: Emergency Medicine | Admitting: Emergency Medicine

## 2016-08-07 ENCOUNTER — Encounter (HOSPITAL_COMMUNITY): Payer: Self-pay | Admitting: *Deleted

## 2016-08-07 DIAGNOSIS — Z79899 Other long term (current) drug therapy: Secondary | ICD-10-CM | POA: Diagnosis not present

## 2016-08-07 DIAGNOSIS — H43393 Other vitreous opacities, bilateral: Secondary | ICD-10-CM

## 2016-08-07 DIAGNOSIS — H579 Unspecified disorder of eye and adnexa: Secondary | ICD-10-CM | POA: Diagnosis present

## 2016-08-07 DIAGNOSIS — D649 Anemia, unspecified: Secondary | ICD-10-CM | POA: Insufficient documentation

## 2016-08-07 DIAGNOSIS — I4891 Unspecified atrial fibrillation: Secondary | ICD-10-CM | POA: Diagnosis not present

## 2016-08-07 DIAGNOSIS — I10 Essential (primary) hypertension: Secondary | ICD-10-CM | POA: Diagnosis not present

## 2016-08-07 DIAGNOSIS — Z87891 Personal history of nicotine dependence: Secondary | ICD-10-CM | POA: Diagnosis not present

## 2016-08-07 DIAGNOSIS — I2510 Atherosclerotic heart disease of native coronary artery without angina pectoris: Secondary | ICD-10-CM | POA: Insufficient documentation

## 2016-08-07 MED ORDER — TETRACAINE HCL 0.5 % OP SOLN
2.0000 [drp] | Freq: Once | OPHTHALMIC | Status: AC
  Administered 2016-08-07: 2 [drp] via OPHTHALMIC
  Filled 2016-08-07: qty 4

## 2016-08-07 NOTE — ED Triage Notes (Addendum)
To ED for further eval of 'floaters' in eyes for past year. States they became worse after his cataract surgery. States he has seen eye doctor for same but he isn't worried about them, per pt. Appears in nad. States he has been told he has vertigo - for the past 5 years.

## 2016-08-07 NOTE — ED Provider Notes (Signed)
[facility] DEPT Provider Note   CSN: [id number] Arrival date & time: 08/07/16  1424     History   Chief Complaint Chief Complaint  Patient presents with  . Eye Problem    HPI [name] [name] is a 76 y.o. male with history of cataracts coming in today for floaters. Patient states he's had floaters for the past year that come and go intermittently however this morning he noticed that there were many more and it lasted for longer. Denies any changes in vision, any vision darkening, no headaches, no nausea, no vomiting. Patient states he saw an ophthalmologist 3 months ago.  HPI  Past Medical History:  Diagnosis Date  . Anemia 10/06/2011  . Anginal pain ([place])   . Arthritis    "joints hurt; shoulders, arms, back" (08/13/2013)  . Atrial fibrillation ([place])   . Carotid stenosis 04/10/11   a. s/p left carotid endarterectomy 04/14/2011.;  b.  Carotid US (11/13):  L CEA ok; RICA 1-39%  . Chronic bronchitis ([place])    "get it q yr"  . Chronic chest pain   . Chronic lower back pain   . Colon polyp    adenomatous  . Coronary artery disease    a. S/p CABG in 2001. b. S/p DES to protected LM and BMS to RCA 2006. c. 12/2009: s/p DES to Cx;  d. 09/2011 Cath: patent stents, patent grafts -->Med Rx.;  e. CP with abnl Nuc => LHC 10/04/12: oLM 30%, dLM stent into the CFX patent, LAD occluded, LIMA-LAD patent, RI 30%, mid AVCFX 30%, oOM1 50%, oRCA stent patent, mRCA 50-60%, Radial graft-Dx patent; EF 55%.=> Med Rx  . Daily headache    "for the last couple weeks" (08/13/2013)  . Diverticulosis   . Esophageal stricture   . GERD (gastroesophageal reflux disease)   . Hemorrhoids   . Hiatal hernia   . Hyperlipidemia   . Hypertension   . Left bundle branch block   . Pancreatitis Dec 2011   ERCP ok.  . Prostate cancer [facility])    s/p cryoablation  . Renal cyst    Seen on CT 08/2011 also with circumferential bladder wall thickening  . Skin cancer    "cut it off my right ear"  . Statin intolerance      Patient Active Problem List   Diagnosis Date Noted  . AKI (acute kidney injury) ([place]) 10/29/2015  . Dehydration 10/28/2015  . Chest pain 10/27/2015  . Atypical chest pain 08/03/2014  . Abdominal pain, chronic, epigastric 08/26/2013  . Abdominal pain, lower 08/15/2013  . CAD (coronary artery disease) 10/05/2012  . Chest pain syndrome 10/06/2011  . Anemia 10/06/2011  . Memory loss 04/07/2011  . ANXIETY 02/01/2010  . ABDOMINAL PAIN-EPIGASTRIC 02/01/2010  . ADENOCARCINOMA, PROSTATE 12/22/2008  . DYSPHAGIA UNSPECIFIED 11/18/2008  . COLONIC POLYPS, HX OF 11/18/2008  . HYPERCHOLESTEROLEMIA, PURE 01/20/2008  . HYPERTENSION, BENIGN 01/20/2008  . CAD, ARTERY BYPASS GRAFT 01/20/2008  . LBBB 01/20/2008  . Carotid stenosis 01/20/2008  . GERD 01/20/2008  . CHEST PAIN, NON-CARDIAC 01/20/2008    Past Surgical History:  Procedure Laterality Date  . CARDIAC CATHETERIZATION    . CHOLECYSTECTOMY    . COLONOSCOPY    . CORONARY ANGIOPLASTY WITH STENT PLACEMENT     "1 + 1"  . CORONARY ARTERY BYPASS GRAFT  2001  . ENDARTERECTOMY  04/14/2011   Procedure: ENDARTERECTOMY CAROTID;  Surgeon: Mal Misty, MD;  Location: [facility];  Service: Vascular;  Laterality: Left;  Would like to  perform procedure first, at 0730  . INGUINAL HERNIA REPAIR Bilateral   . LEFT HEART CATHETERIZATION WITH CORONARY/GRAFT ANGIOGRAM N/A 10/05/2011   Procedure: LEFT HEART CATHETERIZATION WITH Beatrix Fetters;  Surgeon: Hillary Bow, MD;  Location: [facility] CATH LAB;  Service: Cardiovascular;  Laterality: N/A;  . LEFT HEART CATHETERIZATION WITH CORONARY/GRAFT ANGIOGRAM N/A 10/04/2012   Procedure: LEFT HEART CATHETERIZATION WITH Beatrix Fetters;  Surgeon: Jolaine Artist, MD;  Location: [facility] CATH LAB;  Service: Cardiovascular;  Laterality: N/A;  . POLYPECTOMY    . PR VEIN BYPASS GRAFT,AORTO-FEM-POP    . PROSTATE CRYOABLATION         Home Medications    Prior to Admission medications   Medication Sig  Start Date End Date Taking? Authorizing Provider  amLODipine (NORVASC) 10 MG tablet Take 10 mg by mouth daily.   Yes [provider]  aspirin EC 81 MG tablet Take 81 mg by mouth at bedtime.    Yes [provider]  famotidine (PEPCID) 20 MG tablet Take 1 tablet (20 mg total) by mouth at bedtime. 10/29/15  Yes Debbe Odea, MD  isosorbide dinitrate (ISORDIL) 30 MG tablet Take 1 tablet (30 mg total) by mouth 2 (two) times daily. 10/29/15  Yes Debbe Odea, MD  LORazepam (ATIVAN) 0.5 MG tablet Take 0.5 mg by mouth daily as needed for anxiety. 07/17/16  Yes [provider]  meclizine (ANTIVERT) 12.5 MG tablet Take 1 tablet (12.5 mg total) by mouth 3 (three) times daily as needed for dizziness. 09/05/15  Yes Lacretia Leigh, MD  metoprolol tartrate (LOPRESSOR) 25 MG tablet Take 1 tablet (25 mg total) by mouth 2 (two) times daily. Patient taking differently: Take 50 mg by mouth 2 (two) times daily.  10/29/15  Yes Debbe Odea, MD  nitroGLYCERIN (NITROSTAT) 0.4 MG SL tablet Place 1 tablet (0.4 mg total) under the tongue every 5 (five) minutes as needed. For chest pain 07/21/13  Yes Richardson Dopp T, PA-C  pantoprazole (PROTONIX) 40 MG tablet Take 40 mg by mouth 2 (two) times daily.    Yes [provider]  PARoxetine (PAXIL) 10 MG tablet Take 10 mg by mouth daily.   Yes [provider]    Family History Family History  Problem Relation Age of Onset  . Heart disease Mother        Heart Disease before age 71  . Hypertension Mother   . Heart attack Mother   . Lung cancer Father   . Hypertension Sister   . Heart disease Sister        Heart Disease before age 39  . Cancer Sister   . Heart attack Sister   . Hypertension Brother   . Heart disease Brother        Heart Disease before age 58  . Heart attack Brother   . Colon cancer Neg Hx     Social History Social History  Substance Use Topics  . Smoking status: Former Smoker    Years: 0.10    Types:  Cigarettes  . Smokeless tobacco: Current User    Types: Chew     Comment: "smoked a few cigarettes; no more than 1 month"  . Alcohol use No     Comment: "no alcohol since I was a teenager"     Allergies   Shellfish allergy; Zolpidem tartrate; Amoxicillin-pot clavulanate; Codeine; Erythromycin; Hydrocodone; Morphine; Nitrofuran derivatives; Oxycodone hcl; and Tramadol   Review of Systems Review of Systems  Constitutional: Negative for chills and fever.  HENT:  Negative for ear pain and sore throat.   Eyes: Positive for visual disturbance. Negative for photophobia, pain, discharge, redness and itching.  Respiratory: Negative for cough and shortness of breath.   Cardiovascular: Negative for chest pain and palpitations.  Gastrointestinal: Negative for abdominal pain and vomiting.  Genitourinary: Negative for dysuria and hematuria.  Musculoskeletal: Negative for arthralgias and back pain.  Skin: Negative for color change and rash.  Allergic/Immunologic: Negative for immunocompromised state.  Neurological: Negative for seizures and syncope.  Psychiatric/Behavioral: Negative for agitation and behavioral problems.  All other systems reviewed and are negative.    Physical Exam Updated Vital Signs BP (!) 150/75   Pulse 76   Temp (!) 97.5 F (36.4 C) (Oral)   Resp (!) 21   SpO2 96%   Physical Exam  Constitutional: He appears well-developed and well-nourished.  HENT:  Head: Normocephalic and atraumatic.  Mouth/Throat: Oropharynx is clear and moist.  Eyes: Pupils are equal, round, and reactive to light. Conjunctivae and EOM are normal. Right eye exhibits no discharge. Left eye exhibits no discharge. No scleral icterus.  Optic disks unremarkable. Ocular pressure 14 in both eyes. Visual fields intact. 20/40 -1 vision in both eyes  Neck: Neck supple.  Cardiovascular: Normal rate and regular rhythm.   No murmur heard. Pulmonary/Chest: Effort normal and breath sounds normal. No  respiratory distress.  Abdominal: Soft. There is no tenderness.  Musculoskeletal: He exhibits no edema.  Neurological: He is alert.  Skin: Skin is warm and dry.  Psychiatric: He has a normal mood and affect.  Nursing note and vitals reviewed.    ED Treatments / Results  Labs (all labs ordered are listed, but only abnormal results are displayed) Labs Reviewed - No data to display  EKG  EKG Interpretation None       Radiology No results found.  Procedures Procedures (including critical care time)  EMERGENCY DEPARTMENT [place] OCULAR EXAM "Study: Limited Ultrasound of Orbit "  INDICATIONS: Floaters/Flashes Linear probe utilized to obtain images in both long and short axis of the orbit having the patient look left and right if possible.  PERFORMED BY: Myself IMAGES ARCHIVED?: Yes LIMITATIONS: none VIEWS USED: Right orbit and Left orbit INTERPRETATION: No retinal detachment, Lens in proper position, Normal optic nerve diameter    Medications Ordered in ED Medications  tetracaine (PONTOCAINE) 0.5 % ophthalmic solution 2 drop (2 drops Both Eyes Given 08/07/16 2151)     Initial Impression / Assessment and Plan / ED Course  I have reviewed the triage vital signs and the nursing notes.  Pertinent labs & imaging results that were available during my care of the patient were reviewed by me and considered in my medical decision making (see chart for details).     Patient presenting with worsening of floaters that started this morning. Has history of the same over 1 year however states there are more this morning. No nausea vomiting, headaches, vision changes other than the floaters. Denies any darkening or decreased vision. 20/40 -1 vision in bilateral eyes. No obvious abnormalities. Ultrasound was performed and showed no retinal detachment or vitreous hemorrhage. No other concerning findings on ultrasound.   Do not believe ophthalmology consult was indicated as this has been  going on for over a year and currently his floaters have resolved. Doubt glaucoma, retinal detachment, vitreous hemorrhage, or other acute emergent ocular abnormality requiring further imaging or labs. Patient was discharged in stable condition and given instruction to follow-up with ophthalmology this week for further evaluation.  Given their number. Also given strict return precautions for any worsening of his vision or additional sudden onset of a large amount of flasher or floaters. He was comfortable with discharge and outpatient management.  Patient was seen with my attending, Dr. Dayna Barker, who voiced agreement and oversaw the evaluation and treatment of this patient.   [place] seismologist was used in the creation of this note. If there are any errors or inconsistencies needing clarification, please contact me directly.  Final Clinical Impressions(s) / ED Diagnoses   Final diagnoses:  Floaters in visual field, bilateral    New Prescriptions New Prescriptions   No medications on file     Valda Lamb, MD 08/07/16 2210    Merrily Pew, MD 08/08/16 1714

## 2016-08-08 ENCOUNTER — Encounter (HOSPITAL_COMMUNITY): Payer: Self-pay | Admitting: Emergency Medicine

## 2016-08-08 ENCOUNTER — Emergency Department (HOSPITAL_COMMUNITY): Payer: Medicare Other

## 2016-08-08 DIAGNOSIS — R079 Chest pain, unspecified: Secondary | ICD-10-CM | POA: Diagnosis not present

## 2016-08-08 DIAGNOSIS — I10 Essential (primary) hypertension: Secondary | ICD-10-CM | POA: Diagnosis not present

## 2016-08-08 DIAGNOSIS — K219 Gastro-esophageal reflux disease without esophagitis: Secondary | ICD-10-CM | POA: Diagnosis not present

## 2016-08-08 DIAGNOSIS — K449 Diaphragmatic hernia without obstruction or gangrene: Secondary | ICD-10-CM | POA: Diagnosis not present

## 2016-08-08 DIAGNOSIS — Z79899 Other long term (current) drug therapy: Secondary | ICD-10-CM | POA: Diagnosis not present

## 2016-08-08 DIAGNOSIS — Z85828 Personal history of other malignant neoplasm of skin: Secondary | ICD-10-CM | POA: Insufficient documentation

## 2016-08-08 DIAGNOSIS — Z8546 Personal history of malignant neoplasm of prostate: Secondary | ICD-10-CM | POA: Diagnosis not present

## 2016-08-08 DIAGNOSIS — Z7982 Long term (current) use of aspirin: Secondary | ICD-10-CM | POA: Insufficient documentation

## 2016-08-08 DIAGNOSIS — Z87891 Personal history of nicotine dependence: Secondary | ICD-10-CM | POA: Insufficient documentation

## 2016-08-08 DIAGNOSIS — I2510 Atherosclerotic heart disease of native coronary artery without angina pectoris: Secondary | ICD-10-CM | POA: Diagnosis not present

## 2016-08-08 DIAGNOSIS — R110 Nausea: Secondary | ICD-10-CM | POA: Insufficient documentation

## 2016-08-08 DIAGNOSIS — R0602 Shortness of breath: Secondary | ICD-10-CM | POA: Diagnosis not present

## 2016-08-08 DIAGNOSIS — R109 Unspecified abdominal pain: Secondary | ICD-10-CM | POA: Diagnosis present

## 2016-08-08 LAB — COMPREHENSIVE METABOLIC PANEL
ALK PHOS: 59 U/L (ref 38–126)
ALT: 14 U/L — ABNORMAL LOW (ref 17–63)
AST: 18 U/L (ref 15–41)
Albumin: 3.9 g/dL (ref 3.5–5.0)
Anion gap: 9 (ref 5–15)
BILIRUBIN TOTAL: 1.5 mg/dL — AB (ref 0.3–1.2)
BUN: 16 mg/dL (ref 6–20)
CALCIUM: 8.9 mg/dL (ref 8.9–10.3)
CO2: 23 mmol/L (ref 22–32)
CREATININE: 1.2 mg/dL (ref 0.61–1.24)
Chloride: 107 mmol/L (ref 101–111)
GFR, EST NON AFRICAN AMERICAN: 57 mL/min — AB (ref >60)
Glucose, Bld: 112 mg/dL — ABNORMAL HIGH (ref 65–99)
Potassium: 4 mmol/L (ref 3.5–5.1)
SODIUM: 139 mmol/L (ref 135–145)
TOTAL PROTEIN: 6.7 g/dL (ref 6.5–8.1)

## 2016-08-08 LAB — URINALYSIS, ROUTINE W REFLEX MICROSCOPIC
Bilirubin Urine: NEGATIVE
Glucose, UA: NEGATIVE mg/dL
HGB URINE DIPSTICK: NEGATIVE
Ketones, ur: NEGATIVE mg/dL
Leukocytes, UA: NEGATIVE
NITRITE: NEGATIVE
PROTEIN: NEGATIVE mg/dL
SPECIFIC GRAVITY, URINE: 1.023 (ref 1.005–1.030)
pH: 6 (ref 5.0–8.0)

## 2016-08-08 LAB — CBC
HCT: 41.1 % (ref 39.0–52.0)
HEMOGLOBIN: 14.2 g/dL (ref 13.0–17.0)
MCH: 30.5 pg (ref 26.0–34.0)
MCHC: 34.5 g/dL (ref 30.0–36.0)
MCV: 88.2 fL (ref 78.0–100.0)
PLATELETS: 196 10*3/uL (ref 150–400)
RBC: 4.66 MIL/uL (ref 4.22–5.81)
RDW: 13.2 % (ref 11.5–15.5)
WBC: 5.7 10*3/uL (ref 4.0–10.5)

## 2016-08-08 LAB — I-STAT TROPONIN, ED: TROPONIN I, POC: 0 ng/mL (ref 0.00–0.08)

## 2016-08-08 LAB — LIPASE, BLOOD: Lipase: 50 U/L (ref 11–51)

## 2016-08-08 MED ORDER — ONDANSETRON 4 MG PO TBDP
ORAL_TABLET | ORAL | Status: AC
  Filled 2016-08-08: qty 1

## 2016-08-08 MED ORDER — ONDANSETRON 4 MG PO TBDP
4.0000 mg | ORAL_TABLET | Freq: Once | ORAL | Status: AC | PRN
  Administered 2016-08-08: 4 mg via ORAL

## 2016-08-08 NOTE — ED Triage Notes (Signed)
Pt c/o epigastric/chest pain and nausea beginning this am. Also endorses shortness of breath, and lightheadedness, denies radiation of pain, and diaphoresis. Pt states that he hasn't felt like himself today.

## 2016-08-09 ENCOUNTER — Emergency Department (HOSPITAL_COMMUNITY)
Admission: EM | Admit: 2016-08-09 | Discharge: 2016-08-09 | Disposition: A | Discharge status: Home / Self Care | Payer: Medicare Other | Attending: Emergency Medicine | Admitting: Emergency Medicine

## 2016-08-09 ENCOUNTER — Other Ambulatory Visit: Payer: Self-pay

## 2016-08-09 DIAGNOSIS — K219 Gastro-esophageal reflux disease without esophagitis: Secondary | ICD-10-CM

## 2016-08-09 DIAGNOSIS — K449 Diaphragmatic hernia without obstruction or gangrene: Secondary | ICD-10-CM

## 2016-08-09 LAB — I-STAT TROPONIN, ED: Troponin i, poc: 0 ng/mL (ref 0.00–0.08)

## 2016-08-09 MED ORDER — ALUM & MAG HYDROXIDE-SIMETH 200-200-20 MG/5ML PO SUSP
30.0000 mL | Freq: Once | ORAL | Status: AC
Start: 2016-08-09 — End: 2016-08-09
  Administered 2016-08-09: 30 mL via ORAL
  Filled 2016-08-09: qty 30

## 2016-08-09 MED ORDER — SUCRALFATE 1 GM/10ML PO SUSP: 1.0000 g | Freq: Three times a day (TID) | ORAL | 0 refills | Status: DC

## 2016-08-09 MED ORDER — ONDANSETRON 4 MG PO TBDP: 4.0000 mg | ORAL_TABLET | Freq: Three times a day (TID) | ORAL | 0 refills | Status: DC | PRN

## 2016-08-09 MED ORDER — PANTOPRAZOLE SODIUM 40 MG PO TBEC
40.0000 mg | DELAYED_RELEASE_TABLET | Freq: Once | ORAL | Status: AC
  Administered 2016-08-09: 40 mg via ORAL
  Filled 2016-08-09: qty 1

## 2016-08-09 MED ORDER — LIDOCAINE VISCOUS 2 % MT SOLN
15.0000 mL | Freq: Once | OROMUCOSAL | Status: AC
  Administered 2016-08-09: 15 mL via OROMUCOSAL
  Filled 2016-08-09: qty 15

## 2016-08-09 MED ORDER — ONDANSETRON 4 MG PO TBDP
4.0000 mg | ORAL_TABLET | Freq: Once | ORAL | Status: AC
  Administered 2016-08-09: 4 mg via ORAL
  Filled 2016-08-09: qty 1

## 2016-08-09 NOTE — Discharge Instructions (Signed)
Please eat small meals. Please continue your Pepcid and Protonix. Please follow-up with your GI specialist as scheduled.  Please avoid NSAIDs such as aspirin (Goody powders), ibuprofen (Motrin, Advil), naproxen (Aleve) as these may worsen your symptoms.  Tylenol 1000 mg every 6 hours is safe to take as long as you have no history of liver problems (heavy alcohol use, cirrhosis, hepatitis).  Please avoid spicy, acidic (citrus fruits, tomato based sauces, salsa), greasy, fatty foods.  Please avoid caffeine and alcohol.

## 2016-08-09 NOTE — ED Provider Notes (Signed)
By signing my name below, I, Marcello Moores, attest that this documentation has been prepared under the direction and in the presence of Abigayle Wilinski, Delice Bison, DO. Electronically Signed: Marcello Moores, ED Scribe. 08/09/16. 2:29 AM.  TIME SEEN: 2:35 AM  CHIEF COMPLAINT: Abdominal Pain/ Nausea  HPI:  [name] [name] is a 76 y.o. male with history of CAD status post CABG and stents, hypertension, hyperlipidemia, paroxysmal atrial fibrillation, hiatal hernia who presents to the Emergency Department complaining of gradually worsening "tightening" abdominal pain with associated nausea, SOB, lightheadedness, diarrhea that worsened yesterday morning, but began a year ago. His symptoms only occur when he eats or drinks. Pain is not exertional or pleuritic. His current symptoms feel dissimilar to his previous cardiac symptoms. He states that he was seen by GI specialist a year ago, and has an appointment scheduled for the end of July 2018 with Dr. Henrene Pastor. At that time he was diagnosed with GERD. He is on Protonix. He has a h/x of cholecystectomy and hiatal hernia. He states he has these symptoms every day. He states the reason he came to the emergency department was he felt more nauseated today. He describes the discomfort as being in his upper abdomen and feeling like "something is blowing up inside me" whenever he eats or drinks. States it makes him feel like he cannot get a good deep breath.  ROS: See HPI Constitutional: no fever Eyes: no drainage  ENT: no runny nose   Cardiovascular:  no chest pain  Resp: positive SOB  GI: no vomiting, positive for nausea, positive abdominal pain, positive for diarrhea GU: no dysuria Integumentary: no rash  Allergy: no hives  Musculoskeletal: no leg swelling  Neurological: no slurred speech, positive for lightheadedness. ROS otherwise negative  PAST MEDICAL HISTORY/PAST SURGICAL HISTORY:  Past Medical History:  Diagnosis Date  . Anemia 10/06/2011  . Anginal pain  ([place])   . Arthritis    "joints hurt; shoulders, arms, back" (08/13/2013)  . Atrial fibrillation ([place])   . Carotid stenosis 04/10/11   a. s/p left carotid endarterectomy 04/14/2011.;  b.  Carotid US (11/13):  L CEA ok; RICA 1-39%  . Chronic bronchitis ([place])    "get it q yr"  . Chronic chest pain   . Chronic lower back pain   . Colon polyp    adenomatous  . Coronary artery disease    a. S/p CABG in 2001. b. S/p DES to protected LM and BMS to RCA 2006. c. 12/2009: s/p DES to Cx;  d. 09/2011 Cath: patent stents, patent grafts -->Med Rx.;  e. CP with abnl Nuc => LHC 10/04/12: oLM 30%, dLM stent into the CFX patent, LAD occluded, LIMA-LAD patent, RI 30%, mid AVCFX 30%, oOM1 50%, oRCA stent patent, mRCA 50-60%, Radial graft-Dx patent; EF 55%.=> Med Rx  . Daily headache    "for the last couple weeks" (08/13/2013)  . Diverticulosis   . Esophageal stricture   . GERD (gastroesophageal reflux disease)   . Hemorrhoids   . Hiatal hernia   . Hyperlipidemia   . Hypertension   . Left bundle branch block   . Pancreatitis Dec 2011   ERCP ok.  . Prostate cancer [facility])    s/p cryoablation  . Renal cyst    Seen on CT 08/2011 also with circumferential bladder wall thickening  . Skin cancer    "cut it off my right ear"  . Statin intolerance     MEDICATIONS:  Prior to Admission medications   Medication Sig  Start Date End Date Taking? Authorizing Provider  amLODipine (NORVASC) 10 MG tablet Take 10 mg by mouth daily.    [provider]  aspirin EC 81 MG tablet Take 81 mg by mouth at bedtime.     [provider]  famotidine (PEPCID) 20 MG tablet Take 1 tablet (20 mg total) by mouth at bedtime. 10/29/15   Debbe Odea, MD  isosorbide dinitrate (ISORDIL) 30 MG tablet Take 1 tablet (30 mg total) by mouth 2 (two) times daily. 10/29/15   Debbe Odea, MD  LORazepam (ATIVAN) 0.5 MG tablet Take 0.5 mg by mouth daily as needed for anxiety. 07/17/16   [provider]  meclizine (ANTIVERT)  12.5 MG tablet Take 1 tablet (12.5 mg total) by mouth 3 (three) times daily as needed for dizziness. 09/05/15   Lacretia Leigh, MD  metoprolol tartrate (LOPRESSOR) 25 MG tablet Take 1 tablet (25 mg total) by mouth 2 (two) times daily. Patient taking differently: Take 50 mg by mouth 2 (two) times daily.  10/29/15   Debbe Odea, MD  nitroGLYCERIN (NITROSTAT) 0.4 MG SL tablet Place 1 tablet (0.4 mg total) under the tongue every 5 (five) minutes as needed. For chest pain 07/21/13   Richardson Dopp T, PA-C  pantoprazole (PROTONIX) 40 MG tablet Take 40 mg by mouth 2 (two) times daily.     [provider]  PARoxetine (PAXIL) 10 MG tablet Take 10 mg by mouth daily.    [provider]    ALLERGIES:  Allergies  Allergen Reactions  . Shellfish Allergy Anaphylaxis  . Zolpidem Tartrate Other (See Comments)    hallucinations  . Amoxicillin-Pot Clavulanate Other (See Comments)    REACTION: 'Burns' Stomach  . Codeine Nausea And Vomiting  . Erythromycin Diarrhea, Nausea And Vomiting and Other (See Comments)    All mycins cause upset stomach  . Hydrocodone Nausea And Vomiting  . Morphine Nausea And Vomiting  . Nitrofuran Derivatives Other (See Comments)    unknown  . Oxycodone Hcl Nausea And Vomiting  . Tramadol Nausea And Vomiting    SOCIAL HISTORY:  Social History  Substance Use Topics  . Smoking status: Former Smoker    Years: 0.10    Types: Cigarettes  . Smokeless tobacco: Current User    Types: Chew     Comment: "smoked a few cigarettes; no more than 1 month"  . Alcohol use No     Comment: "no alcohol since I was a teenager"    FAMILY HISTORY: Family History  Problem Relation Age of Onset  . Heart disease Mother        Heart Disease before age 16  . Hypertension Mother   . Heart attack Mother   . Lung cancer Father   . Hypertension Sister   . Heart disease Sister        Heart Disease before age 64  . Cancer Sister   . Heart attack Sister   . Hypertension Brother    . Heart disease Brother        Heart Disease before age 57  . Heart attack Brother   . Colon cancer Neg Hx     EXAM: BP 127/68 (BP Location: Right Arm)   Pulse (!) 57   Temp 97.9 F (36.6 C) (Oral)   Resp 18   Ht 5\' 4"  (1.626 m)   Wt 140 lb (63.5 kg)   SpO2 97%   BMI 24.03 kg/m  CONSTITUTIONAL: Alert and oriented and responds appropriately to questions. Elderly, chronically  ill-appearing, pleasant, in no distress HEAD: Normocephalic EYES: Conjunctivae clear, pupils appear equal, EOMI ENT: normal nose; moist mucous membranes NECK: Supple, no meningismus, no nuchal rigidity, no LAD  CARD: RRR; S1 and S2 appreciated; no murmurs, no clicks, no rubs, no gallops RESP: Normal chest excursion without splinting or tachypnea; breath sounds clear and equal bilaterally; no wheezes, no rhonchi, no rales, no hypoxia or respiratory distress, speaking full sentences ABD/GI: Normal bowel sounds; non-distended; soft, tender throughout the epigastric region r, no rebound, no guarding, no peritoneal signs, no hepatosplenomegaly BACK:  The back appears normal and is non-tender to palpation, there is no CVA tenderness EXT: Normal ROM in all joints; non-tender to palpation; no edema; normal capillary refill; no cyanosis, no calf tenderness or swelling    SKIN: Normal color for age and race; warm; no rash NEURO: Moves all extremities equally PSYCH: The patient's mood and manner are appropriate. Grooming and personal hygiene are appropriate.  MEDICAL DECISION MAKING: Patient here with upper abdominal pain. Suspect that this is gastritis, GERD, related to his hiatal hernia. I do not think this is cardiac in nature. First troponin is negative. Will obtain a second troponin and repeat EKG. We'll treat symptomatically with Maalox, viscous lidocaine, Zofran and Protonix. He does not describe this is similar to his previous anginal equivalent. It is not exertional or pleuritic. Have her committed to close  follow-up with his gastroenterologist and he has an appointment scheduled the end of July.  ED PROGRESS: Patient's second troponin negative. EKG shows left bundle branch block which is old for him.  4:45 AM  Pt reports symptoms have improved with Protonix, Zofran, Maalox and lidocaine. We'll discharge with prescriptions for Zofran and Carafate. He is already on Pepcid and Protonix at home which have advised him to continue. He has follow-up with Dr. Henrene Pastor scheduled. The patient is safe for discharge. Again I do not think this is cardiac in nature. I do not think this is a PE or dissection. Patient and family are comfortable with this plan. I feel this is likely gastritis, GERD or related to his hiatal hernia. I do not feel he needs further emergent workup at this time.  At this time, I do not feel there is any life-threatening condition present. I have reviewed and discussed all results (EKG, imaging, lab, urine as appropriate) and exam findings with patient/family. I have reviewed nursing notes and appropriate previous records.  I feel the patient is safe to be discharged home without further emergent workup and can continue workup as an outpatient as needed. Discussed usual and customary return precautions. Patient/family verbalize understanding and are comfortable with this plan.  Outpatient follow-up has been provided if needed. All questions have been answered.    EKG Interpretation  Date/Time:  Wednesday August 09 2016 03:54:10 EDT Ventricular Rate:  54 PR Interval:    QRS Duration: 150 QT Interval:  490 QTC Calculation: 465 R Axis:   -5 Text Interpretation:  Sinus rhythm Left bundle branch block No significant change since last tracing Confirmed by Pryor Curia [phone]) on 08/09/2016 3:56:27 AM        I personally performed the services described in this documentation, which was scribed in my presence. The recorded information has been reviewed and is accurate.     Makynzi Eastland, Delice Bison,  DO 08/09/16 [phone]

## 2016-08-10 DIAGNOSIS — G43B1 Ophthalmoplegic migraine, intractable: Secondary | ICD-10-CM | POA: Diagnosis not present

## 2016-08-15 DIAGNOSIS — Z6824 Body mass index (BMI) 24.0-24.9, adult: Secondary | ICD-10-CM | POA: Diagnosis not present

## 2016-08-15 DIAGNOSIS — R0982 Postnasal drip: Secondary | ICD-10-CM | POA: Diagnosis not present

## 2016-08-15 DIAGNOSIS — M542 Cervicalgia: Secondary | ICD-10-CM | POA: Diagnosis not present

## 2016-08-22 ENCOUNTER — Encounter: Payer: Self-pay | Admitting: Internal Medicine

## 2016-08-22 ENCOUNTER — Ambulatory Visit (INDEPENDENT_AMBULATORY_CARE_PROVIDER_SITE_OTHER): Payer: Medicare Other | Admitting: Internal Medicine

## 2016-08-22 VITALS — BP 160/76 | HR 80 | Ht 63.0 in | Wt 143.0 lb

## 2016-08-22 DIAGNOSIS — R140 Abdominal distension (gaseous): Secondary | ICD-10-CM

## 2016-08-22 DIAGNOSIS — I6523 Occlusion and stenosis of bilateral carotid arteries: Secondary | ICD-10-CM | POA: Diagnosis not present

## 2016-08-22 DIAGNOSIS — R194 Change in bowel habit: Secondary | ICD-10-CM

## 2016-08-22 DIAGNOSIS — K219 Gastro-esophageal reflux disease without esophagitis: Secondary | ICD-10-CM | POA: Diagnosis not present

## 2016-08-22 DIAGNOSIS — K222 Esophageal obstruction: Secondary | ICD-10-CM

## 2016-08-22 NOTE — Progress Notes (Signed)
HISTORY OF PRESENT ILLNESS:  [name] [name] is a 76 y.o. male with multiple significant medical problems who has been seen previously in this office for GERD complicated by peptic stricture requiring esophageal dilation, adenomatous colon polyps, and chronic abdominal complaints. He is status post cholecystectomy. He has a history of idiopathic pancreatitis on 1 occasion. 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 He presents today regarding a myriad of issues. First, he states that he has been having problems with cough. One of his physicians suggested it was his acid reflux. The patient takes pantoprazole 40 mg daily for GERD. On medication no heartburn or indigestion. No recurrent dysphagia. No nocturnal regurgitation or choking spells. Next, he has had chronic abdominal bloating exacerbated by meals. Some abdominal discomfort which she would not describe gastric pain. His bowel habits alternate between constipation and diarrhea. For constipation he takes "powder" (possibly MiraLAX). Nothing for diarrhea. His last upper endoscopy with esophageal dilation was February 2016. His last colonoscopy was performed at that time as well. He does have moderate diverticulosis in the sigmoid colon. Diminutive polyps removed. Consideration of surveillance colonoscopy in 5 years from that date recommended. Recently seen in the emergency room July 17 for complaints of abdominal bloating. Negative cardiac workup. Laboratories have been reviewed and were unremarkable. He was treated with sucralfate and told keep this appointment. Patient states that his abdomen has been feeling better on sucralfate. No abdominal imaging performed. Started here that his wife passed about 9 months ago.  REVIEW OF SYSTEMS:  All non-GI ROS negative except for sinus trouble, anxiety, visual change, confusion, cough, fatigue, muscle cramps, hearing problems, headaches, shortness of breath, voice change, urinary  leakage, urinary frequency, excessive urination, ankle swelling, sore throat  Past Medical History:  Diagnosis Date  . Anemia 10/06/2011  . Anginal pain ([place])   . Arthritis    "joints hurt; shoulders, arms, back" (08/13/2013)  . Atrial fibrillation ([place])   . Carotid stenosis 04/10/11   a. s/p left carotid endarterectomy 04/14/2011.;  b.  Carotid US (11/13):  L CEA ok; RICA 1-39%  . Chronic bronchitis ([place])    "get it q yr"  . Chronic chest pain   . Chronic lower back pain   . Colon polyp    adenomatous  . Coronary artery disease    a. S/p CABG in 2001. b. S/p DES to protected LM and BMS to RCA 2006. c. 12/2009: s/p DES to Cx;  d. 09/2011 Cath: patent stents, patent grafts -->Med Rx.;  e. CP with abnl Nuc => LHC 10/04/12: oLM 30%, dLM stent into the CFX patent, LAD occluded, LIMA-LAD patent, RI 30%, mid AVCFX 30%, oOM1 50%, oRCA stent patent, mRCA 50-60%, Radial graft-Dx patent; EF 55%.=> Med Rx  . Daily headache    "for the last couple weeks" (08/13/2013)  . Diverticulosis   . Esophageal stricture   . GERD (gastroesophageal reflux disease)   . Hemorrhoids   . Hiatal hernia   . Hyperlipidemia   . Hypertension   . Left bundle branch block   . Pancreatitis Dec 2011   ERCP ok.  . Prostate cancer [facility])    s/p cryoablation  . Renal cyst    Seen on CT 08/2011 also with circumferential bladder wall thickening  . Skin cancer    "cut it off my right ear"  . Statin intolerance     Past Surgical History:  Procedure Laterality Date  . CARDIAC CATHETERIZATION    . CHOLECYSTECTOMY    .  COLONOSCOPY    . CORONARY ANGIOPLASTY WITH STENT PLACEMENT     "1 + 1"  . CORONARY ARTERY BYPASS GRAFT  2001  . ENDARTERECTOMY  04/14/2011   Procedure: ENDARTERECTOMY CAROTID;  Surgeon: Mal Misty, MD;  Location: [facility];  Service: Vascular;  Laterality: Left;  Would like to perform procedure first, at 0730  . EYE SURGERY     cataract  . INGUINAL HERNIA REPAIR Bilateral   . LEFT HEART CATHETERIZATION  WITH CORONARY/GRAFT ANGIOGRAM N/A 10/05/2011   Procedure: LEFT HEART CATHETERIZATION WITH Beatrix Fetters;  Surgeon: Hillary Bow, MD;  Location: [facility] CATH LAB;  Service: Cardiovascular;  Laterality: N/A;  . LEFT HEART CATHETERIZATION WITH CORONARY/GRAFT ANGIOGRAM N/A 10/04/2012   Procedure: LEFT HEART CATHETERIZATION WITH Beatrix Fetters;  Surgeon: Jolaine Artist, MD;  Location: [facility] CATH LAB;  Service: Cardiovascular;  Laterality: N/A;  . POLYPECTOMY    . PR VEIN BYPASS GRAFT,AORTO-FEM-POP    . PROSTATE CRYOABLATION      Social History JONTE SHILLER  reports that he has quit smoking. His smoking use included Cigarettes. He quit after 0.10 years of use. His smokeless tobacco use includes Chew. He reports that he does not drink alcohol or use drugs.  family history includes Cancer in his sister; Heart attack in his brother, mother, and sister; Heart disease in his brother, mother, and sister; Hypertension in his brother, mother, and sister; Lung cancer in his father.  Allergies  Allergen Reactions  . Shellfish Allergy Anaphylaxis  . Zolpidem Tartrate Other (See Comments)    hallucinations  . Amoxicillin-Pot Clavulanate Other (See Comments)    REACTION: 'Burns' Stomach  . Codeine Nausea And Vomiting  . Erythromycin Diarrhea, Nausea And Vomiting and Other (See Comments)    All mycins cause upset stomach  . Hydrocodone Nausea And Vomiting  . Morphine Nausea And Vomiting  . Nitrofuran Derivatives Other (See Comments)    unknown  . Oxycodone Hcl Nausea And Vomiting  . Tramadol Nausea And Vomiting       PHYSICAL EXAMINATION: Vital signs: BP (!) 160/76 (BP Location: Left Arm, Patient Position: Sitting, Cuff Size: Normal)   Pulse 80   Ht '5\' 3"'  (1.6 m) Comment: height measured without shoes  Wt 143 lb (64.9 kg)   BMI 25.33 kg/m   Constitutional: Pleasant elderly male, generally well-appearing, no acute distress Psychiatric: alert and oriented x3,  cooperative Eyes: extraocular movements intact, anicteric, conjunctiva pink Mouth: Edentulous. oral pharynx moist, no lesions Neck: supple no lymphadenopathy Cardiovascular: heart regular rate and rhythm, no murmur Lungs: clear to auscultation bilaterally Abdomen: soft, nontender, nondistended, no obvious ascites, no peritoneal signs, normal bowel sounds, no organomegaly Rectal: Omitted Extremities: no clubbing cyanosis or lower extremity edema bilaterally Skin: no lesions on visible extremities Neuro: No focal deficits. Cranial nerves intact  ASSESSMENT:  #1. Abdominal bloating discomfort. No alarm features.  #2. Alternating bowel habits #3. GERD, complicated by peptic stricture. Asymptomatic post dilation on PPI. I do not think GERD is responsible for his intermittent cough #4. History of adenomatous colon polyps or surveillance colonoscopy up-to-date #5. Status post cholecystectomy #6. History of pancreatitis on one occasion #7. Multiple medical problems   PLAN:  #1. Reflux precautions #2. Continue pantoprazole #3. Repeat esophageal dilation should he develop recurrent dysphagia. He is aware #4. Consider colonoscopy around 2021 for surveillance #5. Recommend adding daily fiber supplementation for alternating bowel habits #6. Schedule abdominal ultrasound to evaluate abdominal bloating discomfort #7. Return PCP regarding myriad non-GI complaints #8. Assuming  ultrasound unremarkable, GI follow-up as needed 25 minutes spent face-to-face with the patient. Greater than 50% a time use for counseling regarding his myriad of abdominal complaints and the plan as outlined

## 2016-08-22 NOTE — Patient Instructions (Signed)
You have been scheduled for an abdominal ultrasound at [facility] (1st floor of hospital) on 08/30/2016 at 9:30am. Please arrive 15 minutes prior to your appointment for registration. Make certain not to have anything to eat or drink 6 hours prior to your appointment. Should you need to reschedule your appointment, please contact radiology at [phone]. This test typically takes about 30 minutes to perform.

## 2016-08-27 DIAGNOSIS — L259 Unspecified contact dermatitis, unspecified cause: Secondary | ICD-10-CM | POA: Diagnosis not present

## 2016-08-30 ENCOUNTER — Ambulatory Visit (HOSPITAL_COMMUNITY)
Admission: RE | Admit: 2016-08-30 | Discharge: 2016-08-30 | Disposition: A | Discharge status: Home / Self Care | Payer: Medicare Other | Source: Ambulatory Visit | Attending: Internal Medicine | Admitting: Internal Medicine

## 2016-08-30 DIAGNOSIS — N281 Cyst of kidney, acquired: Secondary | ICD-10-CM | POA: Diagnosis not present

## 2016-08-30 DIAGNOSIS — R194 Change in bowel habit: Secondary | ICD-10-CM

## 2016-08-30 DIAGNOSIS — Z9049 Acquired absence of other specified parts of digestive tract: Secondary | ICD-10-CM | POA: Insufficient documentation

## 2016-08-30 DIAGNOSIS — R1084 Generalized abdominal pain: Secondary | ICD-10-CM | POA: Diagnosis not present

## 2016-08-30 DIAGNOSIS — R140 Abdominal distension (gaseous): Secondary | ICD-10-CM | POA: Diagnosis present

## 2016-09-05 ENCOUNTER — Telehealth: Payer: Self-pay | Admitting: Internal Medicine

## 2016-09-06 NOTE — Telephone Encounter (Signed)
Sure

## 2016-09-07 MED ORDER — SUCRALFATE 1 GM/10ML PO SUSP: 1.0000 g | Freq: Three times a day (TID) | ORAL | 1 refills | Status: DC

## 2016-09-07 NOTE — Telephone Encounter (Signed)
Refilled Carafate per Dr. Henrene Pastor

## 2016-09-08 DIAGNOSIS — R1013 Epigastric pain: Secondary | ICD-10-CM | POA: Diagnosis not present

## 2016-09-08 DIAGNOSIS — Z6823 Body mass index (BMI) 23.0-23.9, adult: Secondary | ICD-10-CM | POA: Diagnosis not present

## 2016-09-08 DIAGNOSIS — G4762 Sleep related leg cramps: Secondary | ICD-10-CM | POA: Diagnosis not present

## 2016-09-08 DIAGNOSIS — I10 Essential (primary) hypertension: Secondary | ICD-10-CM | POA: Diagnosis not present

## 2016-09-08 DIAGNOSIS — Z79899 Other long term (current) drug therapy: Secondary | ICD-10-CM | POA: Diagnosis not present

## 2016-09-08 DIAGNOSIS — F419 Anxiety disorder, unspecified: Secondary | ICD-10-CM | POA: Diagnosis not present

## 2016-09-08 DIAGNOSIS — E7800 Pure hypercholesterolemia, unspecified: Secondary | ICD-10-CM | POA: Diagnosis not present

## 2016-09-08 DIAGNOSIS — I2510 Atherosclerotic heart disease of native coronary artery without angina pectoris: Secondary | ICD-10-CM | POA: Diagnosis not present

## 2016-09-08 DIAGNOSIS — Z125 Encounter for screening for malignant neoplasm of prostate: Secondary | ICD-10-CM | POA: Diagnosis not present

## 2016-11-13 DIAGNOSIS — H40023 Open angle with borderline findings, high risk, bilateral: Secondary | ICD-10-CM | POA: Diagnosis not present

## 2016-11-13 DIAGNOSIS — Z23 Encounter for immunization: Secondary | ICD-10-CM | POA: Diagnosis not present

## 2017-02-19 DIAGNOSIS — E441 Mild protein-calorie malnutrition: Secondary | ICD-10-CM | POA: Diagnosis not present

## 2017-02-19 DIAGNOSIS — J019 Acute sinusitis, unspecified: Secondary | ICD-10-CM | POA: Diagnosis not present

## 2017-02-26 ENCOUNTER — Other Ambulatory Visit: Payer: Self-pay

## 2017-02-26 ENCOUNTER — Encounter (HOSPITAL_COMMUNITY): Payer: Self-pay | Admitting: Emergency Medicine

## 2017-02-26 ENCOUNTER — Emergency Department (HOSPITAL_COMMUNITY): Payer: Medicare Other

## 2017-02-26 ENCOUNTER — Emergency Department (HOSPITAL_COMMUNITY)
Admission: EM | Admit: 2017-02-26 | Discharge: 2017-02-26 | Disposition: A | Discharge status: Home / Self Care | Payer: Medicare Other | Attending: Emergency Medicine | Admitting: Emergency Medicine

## 2017-02-26 DIAGNOSIS — Z79899 Other long term (current) drug therapy: Secondary | ICD-10-CM | POA: Diagnosis not present

## 2017-02-26 DIAGNOSIS — E785 Hyperlipidemia, unspecified: Secondary | ICD-10-CM | POA: Insufficient documentation

## 2017-02-26 DIAGNOSIS — Z951 Presence of aortocoronary bypass graft: Secondary | ICD-10-CM | POA: Insufficient documentation

## 2017-02-26 DIAGNOSIS — I10 Essential (primary) hypertension: Secondary | ICD-10-CM | POA: Insufficient documentation

## 2017-02-26 DIAGNOSIS — R1013 Epigastric pain: Secondary | ICD-10-CM | POA: Diagnosis not present

## 2017-02-26 DIAGNOSIS — R1033 Periumbilical pain: Secondary | ICD-10-CM | POA: Diagnosis not present

## 2017-02-26 DIAGNOSIS — I447 Left bundle-branch block, unspecified: Secondary | ICD-10-CM | POA: Diagnosis not present

## 2017-02-26 DIAGNOSIS — Z7982 Long term (current) use of aspirin: Secondary | ICD-10-CM | POA: Insufficient documentation

## 2017-02-26 DIAGNOSIS — E7800 Pure hypercholesterolemia, unspecified: Secondary | ICD-10-CM | POA: Diagnosis not present

## 2017-02-26 DIAGNOSIS — I4891 Unspecified atrial fibrillation: Secondary | ICD-10-CM | POA: Diagnosis not present

## 2017-02-26 DIAGNOSIS — Z87891 Personal history of nicotine dependence: Secondary | ICD-10-CM | POA: Diagnosis not present

## 2017-02-26 DIAGNOSIS — R0602 Shortness of breath: Secondary | ICD-10-CM | POA: Diagnosis not present

## 2017-02-26 DIAGNOSIS — R0789 Other chest pain: Secondary | ICD-10-CM | POA: Diagnosis not present

## 2017-02-26 DIAGNOSIS — Z8546 Personal history of malignant neoplasm of prostate: Secondary | ICD-10-CM | POA: Insufficient documentation

## 2017-02-26 DIAGNOSIS — R079 Chest pain, unspecified: Secondary | ICD-10-CM | POA: Diagnosis not present

## 2017-02-26 DIAGNOSIS — I2510 Atherosclerotic heart disease of native coronary artery without angina pectoris: Secondary | ICD-10-CM | POA: Insufficient documentation

## 2017-02-26 DIAGNOSIS — R109 Unspecified abdominal pain: Secondary | ICD-10-CM

## 2017-02-26 LAB — CBC
HCT: 41.5 % (ref 39.0–52.0)
Hemoglobin: 14.2 g/dL (ref 13.0–17.0)
MCH: 30.5 pg (ref 26.0–34.0)
MCHC: 34.2 g/dL (ref 30.0–36.0)
MCV: 89.2 fL (ref 78.0–100.0)
Platelets: 189 10*3/uL (ref 150–400)
RBC: 4.65 MIL/uL (ref 4.22–5.81)
RDW: 13.4 % (ref 11.5–15.5)
WBC: 6.1 10*3/uL (ref 4.0–10.5)

## 2017-02-26 LAB — BASIC METABOLIC PANEL
Anion gap: 12 (ref 5–15)
BUN: 17 mg/dL (ref 6–20)
CO2: 22 mmol/L (ref 22–32)
Calcium: 8.7 mg/dL — ABNORMAL LOW (ref 8.9–10.3)
Chloride: 106 mmol/L (ref 101–111)
Creatinine, Ser: 1.17 mg/dL (ref 0.61–1.24)
GFR calc Af Amer: >60 mL/min (ref >60)
GFR calc non Af Amer: 59 mL/min — ABNORMAL LOW (ref >60)
Glucose, Bld: 109 mg/dL — ABNORMAL HIGH (ref 65–99)
Potassium: 3.4 mmol/L — ABNORMAL LOW (ref 3.5–5.1)
Sodium: 140 mmol/L (ref 135–145)

## 2017-02-26 LAB — I-STAT TROPONIN, ED: Troponin i, poc: 0 ng/mL (ref 0.00–0.08)

## 2017-02-26 LAB — TROPONIN I: Troponin I: <0.03 ng/mL (ref <0.03)

## 2017-02-26 MED ORDER — ONDANSETRON 4 MG PO TBDP
4.0000 mg | ORAL_TABLET | Freq: Once | ORAL | Status: AC
  Administered 2017-02-26: 4 mg via ORAL
  Filled 2017-02-26: qty 1

## 2017-02-26 MED ORDER — GI COCKTAIL ~~LOC~~
30.0000 mL | Freq: Once | ORAL | Status: AC
  Administered 2017-02-26: 30 mL via ORAL
  Filled 2017-02-26: qty 30

## 2017-02-26 NOTE — ED Notes (Signed)
ED Provider at bedside. 

## 2017-02-26 NOTE — ED Provider Notes (Signed)
[facility] EMERGENCY DEPARTMENT Provider Note   CSN: [id number] Arrival date & time: 02/26/17  0209     History   Chief Complaint Chief Complaint  Patient presents with  . Chest Pain  . Abdominal Pain    HPI [name] [name] is a 77 y.o. male.  HPI   76yM with chest and abdominal pain.  Recurrent issue.  It was worsening yesterday.  Patient reports pain from his umbilicus up into his neck.  Michela Pitcher it is worse after eating.  It begins "as soon as I swallow."  He does have a past history of reflux.  He is on Carafate, Pepcid and protonix.  He reports compliance with this medication.  He does have a history of known CAD status post previous intervention.  States that current symptoms feels different than symptoms he was having prior to his bypass.  Past Medical History:  Diagnosis Date  . Anemia 10/06/2011  . Anginal pain ([place])   . Arthritis    "joints hurt; shoulders, arms, back" (08/13/2013)  . Atrial fibrillation ([place])   . Carotid stenosis 04/10/11   a. s/p left carotid endarterectomy 04/14/2011.;  b.  Carotid US (11/13):  L CEA ok; RICA 1-39%  . Chronic bronchitis ([place])    "get it q yr"  . Chronic chest pain   . Chronic lower back pain   . Colon polyp    adenomatous  . Coronary artery disease    a. S/p CABG in 2001. b. S/p DES to protected LM and BMS to RCA 2006. c. 12/2009: s/p DES to Cx;  d. 09/2011 Cath: patent stents, patent grafts -->Med Rx.;  e. CP with abnl Nuc => LHC 10/04/12: oLM 30%, dLM stent into the CFX patent, LAD occluded, LIMA-LAD patent, RI 30%, mid AVCFX 30%, oOM1 50%, oRCA stent patent, mRCA 50-60%, Radial graft-Dx patent; EF 55%.=> Med Rx  . Daily headache    "for the last couple weeks" (08/13/2013)  . Diverticulosis   . Esophageal stricture   . GERD (gastroesophageal reflux disease)   . Hemorrhoids   . Hiatal hernia   . Hyperlipidemia   . Hypertension   . Left bundle branch block   . Pancreatitis Dec 2011   ERCP ok.  . Prostate cancer  [facility])    s/p cryoablation  . Renal cyst    Seen on CT 08/2011 also with circumferential bladder wall thickening  . Skin cancer    "cut it off my right ear"  . Statin intolerance     Patient Active Problem List   Diagnosis Date Noted  . AKI (acute kidney injury) ([place]) 10/29/2015  . Dehydration 10/28/2015  . Chest pain 10/27/2015  . Atypical chest pain 08/03/2014  . Abdominal pain, chronic, epigastric 08/26/2013  . Abdominal pain, lower 08/15/2013  . CAD (coronary artery disease) 10/05/2012  . Chest pain syndrome 10/06/2011  . Anemia 10/06/2011  . Memory loss 04/07/2011  . ANXIETY 02/01/2010  . ABDOMINAL PAIN-EPIGASTRIC 02/01/2010  . ADENOCARCINOMA, PROSTATE 12/22/2008  . DYSPHAGIA UNSPECIFIED 11/18/2008  . COLONIC POLYPS, HX OF 11/18/2008  . HYPERCHOLESTEROLEMIA, PURE 01/20/2008  . HYPERTENSION, BENIGN 01/20/2008  . CAD, ARTERY BYPASS GRAFT 01/20/2008  . LBBB 01/20/2008  . Carotid stenosis 01/20/2008  . GERD 01/20/2008  . CHEST PAIN, NON-CARDIAC 01/20/2008    Past Surgical History:  Procedure Laterality Date  . CARDIAC CATHETERIZATION    . CHOLECYSTECTOMY    . COLONOSCOPY    . CORONARY ANGIOPLASTY WITH STENT PLACEMENT     "  1 + 1"  . CORONARY ARTERY BYPASS GRAFT  2001  . ENDARTERECTOMY  04/14/2011   Procedure: ENDARTERECTOMY CAROTID;  Surgeon: Mal Misty, MD;  Location: [facility];  Service: Vascular;  Laterality: Left;  Would like to perform procedure first, at 0730  . EYE SURGERY     cataract  . INGUINAL HERNIA REPAIR Bilateral   . LEFT HEART CATHETERIZATION WITH CORONARY/GRAFT ANGIOGRAM N/A 10/05/2011   Procedure: LEFT HEART CATHETERIZATION WITH Beatrix Fetters;  Surgeon: Hillary Bow, MD;  Location: [facility] CATH LAB;  Service: Cardiovascular;  Laterality: N/A;  . LEFT HEART CATHETERIZATION WITH CORONARY/GRAFT ANGIOGRAM N/A 10/04/2012   Procedure: LEFT HEART CATHETERIZATION WITH Beatrix Fetters;  Surgeon: Jolaine Artist, MD;  Location: [facility] CATH LAB;   Service: Cardiovascular;  Laterality: N/A;  . POLYPECTOMY    . PR VEIN BYPASS GRAFT,AORTO-FEM-POP    . PROSTATE CRYOABLATION         Home Medications    Prior to Admission medications   Medication Sig Start Date End Date Taking? Authorizing Provider  amLODipine (NORVASC) 10 MG tablet Take 10 mg by mouth daily.    [provider]  aspirin EC 81 MG tablet Take 81 mg by mouth at bedtime.     [provider]  famotidine (PEPCID) 20 MG tablet Take 1 tablet (20 mg total) by mouth at bedtime. 10/29/15   Debbe Odea, MD  isosorbide dinitrate (ISORDIL) 30 MG tablet Take 1 tablet (30 mg total) by mouth 2 (two) times daily. 10/29/15   Debbe Odea, MD  LORazepam (ATIVAN) 0.5 MG tablet Take 0.5 mg by mouth daily as needed for anxiety. 07/17/16   [provider]  meclizine (ANTIVERT) 12.5 MG tablet Take 1 tablet (12.5 mg total) by mouth 3 (three) times daily as needed for dizziness. 09/05/15   Lacretia Leigh, MD  metoprolol tartrate (LOPRESSOR) 25 MG tablet Take 1 tablet (25 mg total) by mouth 2 (two) times daily. Patient taking differently: Take 50 mg by mouth 2 (two) times daily.  10/29/15   Debbe Odea, MD  nitroGLYCERIN (NITROSTAT) 0.4 MG SL tablet Place 1 tablet (0.4 mg total) under the tongue every 5 (five) minutes as needed. For chest pain 07/21/13   Richardson Dopp T, PA-C  ondansetron (ZOFRAN ODT) 4 MG disintegrating tablet Take 1 tablet (4 mg total) by mouth every 8 (eight) hours as needed for nausea or vomiting. 08/09/16   Ward, Delice Bison, DO  pantoprazole (PROTONIX) 40 MG tablet Take 40 mg by mouth 2 (two) times daily.     [provider]  PARoxetine (PAXIL) 10 MG tablet Take 10 mg by mouth daily.    [provider]  sucralfate (CARAFATE) 1 GM/10ML suspension Take 10 mLs (1 g total) by mouth 4 (four) times daily -  with meals and at bedtime. 09/07/16   Irene Shipper, MD    Family History Family History  Problem Relation Age of Onset  . Heart  disease Mother        Heart Disease before age 62  . Hypertension Mother   . Heart attack Mother   . Lung cancer Father   . Hypertension Sister   . Heart disease Sister        Heart Disease before age 52  . Cancer Sister   . Heart attack Sister   . Hypertension Brother   . Heart disease Brother        Heart Disease before age 46  . Heart attack Brother   .  Colon cancer Neg Hx     Social History Social History   Tobacco Use  . Smoking status: Former Smoker    Years: 0.10    Types: Cigarettes  . Smokeless tobacco: Current User    Types: Chew  . Tobacco comment: "smoked a few cigarettes; no more than 1 month"  Substance Use Topics  . Alcohol use: No    Alcohol/week: 0.0 oz    Comment: "no alcohol since I was a teenager"  . Drug use: No     Allergies   Shellfish allergy; Zolpidem tartrate; Amoxicillin-pot clavulanate; Codeine; Erythromycin; Hydrocodone; Morphine; Nitrofuran derivatives; Oxycodone hcl; and Tramadol   Review of Systems Review of Systems  All systems reviewed and negative, other than as noted in HPI.  Physical Exam Updated Vital Signs BP (!) 142/71   Pulse 62   Temp (!) 97.5 F (36.4 C) (Oral)   Resp 17   Ht 5\' 4"  (1.626 m)   Wt 63.5 kg (140 lb)   SpO2 98%   BMI 24.03 kg/m   Physical Exam  Constitutional: He appears well-developed and well-nourished. No distress.  HENT:  Head: Normocephalic and atraumatic.  Eyes: Conjunctivae are normal. Right eye exhibits no discharge. Left eye exhibits no discharge.  Neck: Neck supple.  Cardiovascular: Normal rate, regular rhythm and normal heart sounds. Exam reveals no gallop and no friction rub.  No murmur heard. Pulmonary/Chest: Effort normal and breath sounds normal. No respiratory distress.  Abdominal: Soft. He exhibits no distension.  Mild epigastric tenderness without rebound or guarding.  Musculoskeletal: He exhibits no edema or tenderness.  Neurological: He is alert.  Skin: Skin is warm and  dry.  Psychiatric: He has a normal mood and affect. His behavior is normal. Thought content normal.  Nursing note and vitals reviewed.    ED Treatments / Results  Labs (all labs ordered are listed, but only abnormal results are displayed) Labs Reviewed  BASIC METABOLIC PANEL - Abnormal; Notable for the following components:      Result Value   Potassium 3.4 (*)    Glucose, Bld 109 (*)    Calcium 8.7 (*)    GFR calc non Af Amer 59 (*)    All other components within normal limits  CBC  TROPONIN I  I-STAT TROPONIN, ED    EKG  EKG Interpretation  Date/Time:  Monday February 26 2017 02:20:09 EST Ventricular Rate:  67 PR Interval:  140 QRS Duration: 142 QT Interval:  460 QTC Calculation: 486 R Axis:   -12 Text Interpretation:  Normal sinus rhythm Left bundle branch block Abnormal ECG When compared with ECG of 08/09/2016, No significant change was found Confirmed by Delora Fuel ([id number]) on 02/26/2017 4:13:07 AM Also confirmed by Delora Fuel ([id number]), editor Hattie Perch [phone])  on 02/26/2017 7:06:50 AM       Radiology Dg Chest 2 View  Result Date: 02/26/2017 CLINICAL DATA:  Abdominal and central chest pain for 2 days. Occasional shortness of breath. History of chronic bronchitis. EXAM: CHEST  2 VIEW COMPARISON:  Chest radiograph August 08, 2016 FINDINGS: Cardiac silhouette is mildly enlarged, status post CABG. Mildly calcified aortic knob. No pleural effusion or focal consolidation. No pneumothorax. Soft tissue planes and included osseous structures are non suspicious. Surgical clips in the included right abdomen compatible with cholecystectomy. Mild degenerative change of the thoracic spine. IMPRESSION: No acute cardiopulmonary process. Electronically Signed   By: Elon Alas M.D.   On: 02/26/2017 03:03    Procedures Procedures (including critical  care time)  Medications Ordered in ED Medications  gi cocktail (Maalox,Lidocaine,Donnatal) (not administered)  ondansetron  (ZOFRAN-ODT) disintegrating tablet 4 mg (4 mg Oral Given 02/26/17 0246)     Initial Impression / Assessment and Plan / ED Course  I have reviewed the triage vital signs and the nursing notes.  Pertinent labs & imaging results that were available during my care of the patient were reviewed by me and considered in my medical decision making (see chart for details).     77 year old male with chest and abdominal pain. Probably GI etiology, sounds like reflux esophagitis.  History of the same.  Worse after eating.  Consider ACS, but doubt.  EKG is not acutely changed.  Will repeat troponin.  Trop remains neg. Continue GI meds. Outpt FU.   Final Clinical Impressions(s) / ED Diagnoses   Final diagnoses:  Abdominal pain, unspecified abdominal location  Atypical chest pain    ED Discharge Orders    None       Virgel Manifold, MD 02/26/17 1243

## 2017-02-26 NOTE — ED Triage Notes (Signed)
Patient reports abdominal pain and chest pain for two days.  Describes both as pressure type pain.  Also c/o sharp pain that run in head off and on.

## 2017-02-28 DIAGNOSIS — H66009 Acute suppurative otitis media without spontaneous rupture of ear drum, unspecified ear: Secondary | ICD-10-CM | POA: Diagnosis not present

## 2017-02-28 DIAGNOSIS — J0100 Acute maxillary sinusitis, unspecified: Secondary | ICD-10-CM | POA: Diagnosis not present

## 2017-03-12 DIAGNOSIS — I2510 Atherosclerotic heart disease of native coronary artery without angina pectoris: Secondary | ICD-10-CM | POA: Diagnosis not present

## 2017-03-12 DIAGNOSIS — E7800 Pure hypercholesterolemia, unspecified: Secondary | ICD-10-CM | POA: Diagnosis not present

## 2017-03-12 DIAGNOSIS — Z79899 Other long term (current) drug therapy: Secondary | ICD-10-CM | POA: Diagnosis not present

## 2017-03-12 DIAGNOSIS — Z6824 Body mass index (BMI) 24.0-24.9, adult: Secondary | ICD-10-CM | POA: Diagnosis not present

## 2017-03-12 DIAGNOSIS — R109 Unspecified abdominal pain: Secondary | ICD-10-CM | POA: Diagnosis not present

## 2017-03-12 DIAGNOSIS — K219 Gastro-esophageal reflux disease without esophagitis: Secondary | ICD-10-CM | POA: Diagnosis not present

## 2017-03-12 DIAGNOSIS — I679 Cerebrovascular disease, unspecified: Secondary | ICD-10-CM | POA: Diagnosis not present

## 2017-03-12 DIAGNOSIS — F419 Anxiety disorder, unspecified: Secondary | ICD-10-CM | POA: Diagnosis not present

## 2017-03-12 DIAGNOSIS — I10 Essential (primary) hypertension: Secondary | ICD-10-CM | POA: Diagnosis not present

## 2017-03-13 DIAGNOSIS — R1084 Generalized abdominal pain: Secondary | ICD-10-CM | POA: Diagnosis not present

## 2017-03-15 DIAGNOSIS — N281 Cyst of kidney, acquired: Secondary | ICD-10-CM | POA: Diagnosis not present

## 2017-03-15 DIAGNOSIS — R1084 Generalized abdominal pain: Secondary | ICD-10-CM | POA: Diagnosis not present

## 2017-03-23 DIAGNOSIS — R1013 Epigastric pain: Secondary | ICD-10-CM | POA: Diagnosis not present

## 2017-03-23 DIAGNOSIS — R072 Precordial pain: Secondary | ICD-10-CM | POA: Diagnosis not present

## 2017-03-23 DIAGNOSIS — G8929 Other chronic pain: Secondary | ICD-10-CM | POA: Diagnosis not present

## 2017-03-23 DIAGNOSIS — R110 Nausea: Secondary | ICD-10-CM | POA: Diagnosis not present

## 2017-03-23 DIAGNOSIS — R1084 Generalized abdominal pain: Secondary | ICD-10-CM | POA: Diagnosis not present

## 2017-03-23 DIAGNOSIS — R0602 Shortness of breath: Secondary | ICD-10-CM | POA: Diagnosis not present

## 2017-03-23 DIAGNOSIS — Z951 Presence of aortocoronary bypass graft: Secondary | ICD-10-CM | POA: Diagnosis not present

## 2017-03-23 DIAGNOSIS — I10 Essential (primary) hypertension: Secondary | ICD-10-CM | POA: Diagnosis not present

## 2017-04-04 DIAGNOSIS — R1084 Generalized abdominal pain: Secondary | ICD-10-CM | POA: Diagnosis not present

## 2017-04-04 DIAGNOSIS — R1310 Dysphagia, unspecified: Secondary | ICD-10-CM | POA: Diagnosis not present

## 2017-04-04 DIAGNOSIS — K219 Gastro-esophageal reflux disease without esophagitis: Secondary | ICD-10-CM | POA: Diagnosis not present

## 2017-04-16 DIAGNOSIS — R079 Chest pain, unspecified: Secondary | ICD-10-CM | POA: Diagnosis not present

## 2017-04-16 DIAGNOSIS — Z79899 Other long term (current) drug therapy: Secondary | ICD-10-CM | POA: Diagnosis not present

## 2017-04-16 DIAGNOSIS — Z7982 Long term (current) use of aspirin: Secondary | ICD-10-CM | POA: Diagnosis not present

## 2017-04-16 DIAGNOSIS — I10 Essential (primary) hypertension: Secondary | ICD-10-CM | POA: Diagnosis not present

## 2017-04-16 DIAGNOSIS — R072 Precordial pain: Secondary | ICD-10-CM | POA: Diagnosis not present

## 2017-04-16 DIAGNOSIS — R251 Tremor, unspecified: Secondary | ICD-10-CM | POA: Diagnosis not present

## 2017-04-16 DIAGNOSIS — Z951 Presence of aortocoronary bypass graft: Secondary | ICD-10-CM | POA: Diagnosis not present

## 2017-04-16 DIAGNOSIS — F419 Anxiety disorder, unspecified: Secondary | ICD-10-CM | POA: Diagnosis not present

## 2017-04-16 DIAGNOSIS — G252 Other specified forms of tremor: Secondary | ICD-10-CM | POA: Diagnosis not present

## 2017-04-16 DIAGNOSIS — I252 Old myocardial infarction: Secondary | ICD-10-CM | POA: Diagnosis not present

## 2017-04-16 DIAGNOSIS — I2510 Atherosclerotic heart disease of native coronary artery without angina pectoris: Secondary | ICD-10-CM | POA: Diagnosis not present

## 2017-04-16 DIAGNOSIS — R200 Anesthesia of skin: Secondary | ICD-10-CM | POA: Diagnosis not present

## 2017-04-17 DIAGNOSIS — R251 Tremor, unspecified: Secondary | ICD-10-CM | POA: Diagnosis not present

## 2017-04-17 DIAGNOSIS — R200 Anesthesia of skin: Secondary | ICD-10-CM | POA: Diagnosis not present

## 2017-04-18 DIAGNOSIS — F419 Anxiety disorder, unspecified: Secondary | ICD-10-CM | POA: Diagnosis not present

## 2017-04-18 DIAGNOSIS — R251 Tremor, unspecified: Secondary | ICD-10-CM | POA: Diagnosis not present

## 2017-04-18 DIAGNOSIS — Z9181 History of falling: Secondary | ICD-10-CM | POA: Diagnosis not present

## 2017-04-18 DIAGNOSIS — Z6823 Body mass index (BMI) 23.0-23.9, adult: Secondary | ICD-10-CM | POA: Diagnosis not present

## 2017-04-18 DIAGNOSIS — Z1331 Encounter for screening for depression: Secondary | ICD-10-CM | POA: Diagnosis not present

## 2017-04-18 DIAGNOSIS — R079 Chest pain, unspecified: Secondary | ICD-10-CM | POA: Diagnosis not present

## 2017-04-20 ENCOUNTER — Encounter (HOSPITAL_COMMUNITY): Payer: Self-pay

## 2017-04-20 ENCOUNTER — Emergency Department (HOSPITAL_COMMUNITY)
Admission: EM | Admit: 2017-04-20 | Discharge: 2017-04-21 | Disposition: A | Discharge status: Home / Self Care | Payer: Medicare Other | Attending: Emergency Medicine | Admitting: Emergency Medicine

## 2017-04-20 ENCOUNTER — Other Ambulatory Visit: Payer: Self-pay

## 2017-04-20 ENCOUNTER — Emergency Department (HOSPITAL_COMMUNITY): Payer: Medicare Other

## 2017-04-20 DIAGNOSIS — R079 Chest pain, unspecified: Secondary | ICD-10-CM | POA: Insufficient documentation

## 2017-04-20 DIAGNOSIS — R0602 Shortness of breath: Secondary | ICD-10-CM | POA: Diagnosis not present

## 2017-04-20 DIAGNOSIS — Z79899 Other long term (current) drug therapy: Secondary | ICD-10-CM | POA: Diagnosis not present

## 2017-04-20 DIAGNOSIS — Z8546 Personal history of malignant neoplasm of prostate: Secondary | ICD-10-CM | POA: Diagnosis not present

## 2017-04-20 DIAGNOSIS — I2510 Atherosclerotic heart disease of native coronary artery without angina pectoris: Secondary | ICD-10-CM | POA: Diagnosis not present

## 2017-04-20 DIAGNOSIS — Z7982 Long term (current) use of aspirin: Secondary | ICD-10-CM | POA: Insufficient documentation

## 2017-04-20 DIAGNOSIS — R072 Precordial pain: Secondary | ICD-10-CM | POA: Diagnosis not present

## 2017-04-20 DIAGNOSIS — R251 Tremor, unspecified: Secondary | ICD-10-CM | POA: Diagnosis not present

## 2017-04-20 DIAGNOSIS — Z951 Presence of aortocoronary bypass graft: Secondary | ICD-10-CM | POA: Diagnosis not present

## 2017-04-20 DIAGNOSIS — Z85828 Personal history of other malignant neoplasm of skin: Secondary | ICD-10-CM | POA: Diagnosis not present

## 2017-04-20 DIAGNOSIS — G20 Parkinson's disease: Secondary | ICD-10-CM | POA: Diagnosis not present

## 2017-04-20 DIAGNOSIS — I10 Essential (primary) hypertension: Secondary | ICD-10-CM | POA: Insufficient documentation

## 2017-04-20 DIAGNOSIS — F17220 Nicotine dependence, chewing tobacco, uncomplicated: Secondary | ICD-10-CM | POA: Diagnosis not present

## 2017-04-20 LAB — BASIC METABOLIC PANEL
ANION GAP: 9 (ref 5–15)
BUN: 15 mg/dL (ref 6–20)
CALCIUM: 8.9 mg/dL (ref 8.9–10.3)
CO2: 26 mmol/L (ref 22–32)
Chloride: 106 mmol/L (ref 101–111)
Creatinine, Ser: 1.28 mg/dL — ABNORMAL HIGH (ref 0.61–1.24)
GFR, EST NON AFRICAN AMERICAN: 53 mL/min — AB (ref >60)
Glucose, Bld: 118 mg/dL — ABNORMAL HIGH (ref 65–99)
Potassium: 3.9 mmol/L (ref 3.5–5.1)
Sodium: 141 mmol/L (ref 135–145)

## 2017-04-20 LAB — CBC
HCT: 41.7 % (ref 39.0–52.0)
HEMOGLOBIN: 14.3 g/dL (ref 13.0–17.0)
MCH: 30.8 pg (ref 26.0–34.0)
MCHC: 34.3 g/dL (ref 30.0–36.0)
MCV: 89.9 fL (ref 78.0–100.0)
Platelets: 213 10*3/uL (ref 150–400)
RBC: 4.64 MIL/uL (ref 4.22–5.81)
RDW: 13.9 % (ref 11.5–15.5)
WBC: 8.9 10*3/uL (ref 4.0–10.5)

## 2017-04-20 LAB — I-STAT TROPONIN, ED: TROPONIN I, POC: 0 ng/mL (ref 0.00–0.08)

## 2017-04-20 LAB — I-STAT CG4 LACTIC ACID, ED: LACTIC ACID, VENOUS: 1.65 mmol/L (ref 0.5–1.9)

## 2017-04-20 NOTE — ED Triage Notes (Signed)
Pt states all week he has been having tremors, sharp pains in his head and some pain in his chest. Some tremor noted. Pt also reports some shortness of breath. Skin warm and dry no distress noted at this time.

## 2017-04-20 NOTE — ED Provider Notes (Signed)
[facility] EMERGENCY DEPARTMENT Provider Note   CSN: [id number] Arrival date & time: 04/20/17  1304     History   Chief Complaint Chief Complaint  Patient presents with  . Chest Pain    HPI [name] [name] is a 77 y.o. male.  HPI 77 year old male comes in with chief complaint of chest pain and tremors. Patient has history of atrial fibrillation, carotid stenosis, CAD and headaches.  Patient states that he has had worsening shortness of breath with exertion over the past several days.  Patient is also now having tremors over her upper extremities over the past 2 weeks.  Patient states that because of his tremors simple tasks like shaving have become difficult.  Patient also thinks that he has tremors present at rest.  Patient was started on Carafate recently otherwise no new medications with the shortness of breath patient has had chest pains, but they are not new.  Patient's chest pain is described as midsternal discomfort, with no specific evoking factor, aggravating or relieving factors.  Chest pain is different than his previous ACS pain.  Past Medical History:  Diagnosis Date  . Anemia 10/06/2011  . Anginal pain ([place])   . Arthritis    "joints hurt; shoulders, arms, back" (08/13/2013)  . Atrial fibrillation ([place])   . Carotid stenosis 04/10/11   a. s/p left carotid endarterectomy 04/14/2011.;  b.  Carotid US (11/13):  L CEA ok; RICA 1-39%  . Chronic bronchitis ([place])    "get it q yr"  . Chronic chest pain   . Chronic lower back pain   . Colon polyp    adenomatous  . Coronary artery disease    a. S/p CABG in 2001. b. S/p DES to protected LM and BMS to RCA 2006. c. 12/2009: s/p DES to Cx;  d. 09/2011 Cath: patent stents, patent grafts -->Med Rx.;  e. CP with abnl Nuc => LHC 10/04/12: oLM 30%, dLM stent into the CFX patent, LAD occluded, LIMA-LAD patent, RI 30%, mid AVCFX 30%, oOM1 50%, oRCA stent patent, mRCA 50-60%, Radial graft-Dx patent; EF 55%.=> Med Rx  . Daily  headache    "for the last couple weeks" (08/13/2013)  . Diverticulosis   . Esophageal stricture   . GERD (gastroesophageal reflux disease)   . Hemorrhoids   . Hiatal hernia   . Hyperlipidemia   . Hypertension   . Left bundle branch block   . Pancreatitis Dec 2011   ERCP ok.  . Prostate cancer [facility])    s/p cryoablation  . Renal cyst    Seen on CT 08/2011 also with circumferential bladder wall thickening  . Skin cancer    "cut it off my right ear"  . Statin intolerance     Patient Active Problem List   Diagnosis Date Noted  . AKI (acute kidney injury) ([place]) 10/29/2015  . Dehydration 10/28/2015  . Chest pain 10/27/2015  . Atypical chest pain 08/03/2014  . Abdominal pain, chronic, epigastric 08/26/2013  . Abdominal pain, lower 08/15/2013  . CAD (coronary artery disease) 10/05/2012  . Chest pain syndrome 10/06/2011  . Anemia 10/06/2011  . Memory loss 04/07/2011  . ANXIETY 02/01/2010  . ABDOMINAL PAIN-EPIGASTRIC 02/01/2010  . ADENOCARCINOMA, PROSTATE 12/22/2008  . DYSPHAGIA UNSPECIFIED 11/18/2008  . COLONIC POLYPS, HX OF 11/18/2008  . HYPERCHOLESTEROLEMIA, PURE 01/20/2008  . HYPERTENSION, BENIGN 01/20/2008  . CAD, ARTERY BYPASS GRAFT 01/20/2008  . LBBB 01/20/2008  . Carotid stenosis 01/20/2008  . GERD 01/20/2008  . CHEST PAIN,  NON-CARDIAC 01/20/2008    Past Surgical History:  Procedure Laterality Date  . CARDIAC CATHETERIZATION    . CHOLECYSTECTOMY    . COLONOSCOPY    . CORONARY ANGIOPLASTY WITH STENT PLACEMENT     "1 + 1"  . CORONARY ARTERY BYPASS GRAFT  2001  . ENDARTERECTOMY  04/14/2011   Procedure: ENDARTERECTOMY CAROTID;  Surgeon: Mal Misty, MD;  Location: [facility];  Service: Vascular;  Laterality: Left;  Would like to perform procedure first, at 0730  . EYE SURGERY     cataract  . INGUINAL HERNIA REPAIR Bilateral   . LEFT HEART CATHETERIZATION WITH CORONARY/GRAFT ANGIOGRAM N/A 10/05/2011   Procedure: LEFT HEART CATHETERIZATION WITH Beatrix Fetters;   Surgeon: Hillary Bow, MD;  Location: [facility] CATH LAB;  Service: Cardiovascular;  Laterality: N/A;  . LEFT HEART CATHETERIZATION WITH CORONARY/GRAFT ANGIOGRAM N/A 10/04/2012   Procedure: LEFT HEART CATHETERIZATION WITH Beatrix Fetters;  Surgeon: Jolaine Artist, MD;  Location: [facility] CATH LAB;  Service: Cardiovascular;  Laterality: N/A;  . POLYPECTOMY    . PR VEIN BYPASS GRAFT,AORTO-FEM-POP    . PROSTATE CRYOABLATION          Home Medications    Prior to Admission medications   Medication Sig Start Date End Date Taking? Authorizing Provider  amLODipine (NORVASC) 10 MG tablet Take 10 mg by mouth daily.    [provider]  aspirin EC 81 MG tablet Take 81 mg by mouth at bedtime.     [provider]  famotidine (PEPCID) 20 MG tablet Take 1 tablet (20 mg total) by mouth at bedtime. 10/29/15   Debbe Odea, MD  isosorbide dinitrate (ISORDIL) 30 MG tablet Take 1 tablet (30 mg total) by mouth 2 (two) times daily. 10/29/15   Debbe Odea, MD  LORazepam (ATIVAN) 0.5 MG tablet Take 0.5 mg by mouth daily as needed for anxiety. 07/17/16   [provider]  meclizine (ANTIVERT) 12.5 MG tablet Take 1 tablet (12.5 mg total) by mouth 3 (three) times daily as needed for dizziness. 09/05/15   Lacretia Leigh, MD  metoprolol tartrate (LOPRESSOR) 25 MG tablet Take 1 tablet (25 mg total) by mouth 2 (two) times daily. Patient taking differently: Take 50 mg by mouth 2 (two) times daily.  10/29/15   Debbe Odea, MD  nitroGLYCERIN (NITROSTAT) 0.4 MG SL tablet Place 1 tablet (0.4 mg total) under the tongue every 5 (five) minutes as needed. For chest pain 07/21/13   Richardson Dopp T, PA-C  ondansetron (ZOFRAN ODT) 4 MG disintegrating tablet Take 1 tablet (4 mg total) by mouth every 8 (eight) hours as needed for nausea or vomiting. 08/09/16   Ward, Delice Bison, DO  pantoprazole (PROTONIX) 40 MG tablet Take 40 mg by mouth 2 (two) times daily.     [provider]  PARoxetine (PAXIL) 10  MG tablet Take 10 mg by mouth daily.    [provider]  sucralfate (CARAFATE) 1 GM/10ML suspension Take 10 mLs (1 g total) by mouth 4 (four) times daily -  with meals and at bedtime. 09/07/16   Irene Shipper, MD    Family History Family History  Problem Relation Age of Onset  . Heart disease Mother        Heart Disease before age 30  . Hypertension Mother   . Heart attack Mother   . Lung cancer Father   . Hypertension Sister   . Heart disease Sister        Heart Disease before age 71  .  Cancer Sister   . Heart attack Sister   . Hypertension Brother   . Heart disease Brother        Heart Disease before age 50  . Heart attack Brother   . Colon cancer Neg Hx     Social History Social History   Tobacco Use  . Smoking status: Former Smoker    Years: 0.10    Types: Cigarettes  . Smokeless tobacco: Current User    Types: Chew  . Tobacco comment: "smoked a few cigarettes; no more than 1 month"  Substance Use Topics  . Alcohol use: No    Alcohol/week: 0.0 oz    Comment: "no alcohol since I was a teenager"  . Drug use: No     Allergies   Shellfish allergy; Zolpidem tartrate; Amoxicillin-pot clavulanate; Codeine; Erythromycin; Hydrocodone; Morphine; Nitrofuran derivatives; Oxycodone hcl; and Tramadol   Review of Systems Review of Systems  Constitutional: Positive for activity change.  Respiratory: Positive for shortness of breath.   Cardiovascular: Positive for chest pain.  Neurological: Positive for tremors.  Hematological: Does not bruise/bleed easily.  All other systems reviewed and are negative.    Physical Exam Updated Vital Signs BP (!) 160/65   Pulse 64   Temp 97.7 F (36.5 C) (Oral)   Resp 12   SpO2 100%   Physical Exam  Constitutional: He is oriented to person, place, and time. He appears well-developed.  HENT:  Head: Atraumatic.  Neck: Neck supple.  Cardiovascular: Normal rate.  Pulmonary/Chest: Effort normal.  Neurological: He is alert  and oriented to person, place, and time.  Cerebellar exam is normal (finger to nose) Sensory exam normal for bilateral upper and lower extremities - and patient is able to discriminate between sharp and dull.   Skin: Skin is warm.  Nursing note and vitals reviewed.    ED Treatments / Results  Labs (all labs ordered are listed, but only abnormal results are displayed) Labs Reviewed  BASIC METABOLIC PANEL - Abnormal; Notable for the following components:      Result Value   Glucose, Bld 118 (*)    Creatinine, Ser 1.28 (*)    GFR calc non Af Amer 53 (*)    All other components within normal limits  BRAIN NATRIURETIC PEPTIDE - Abnormal; Notable for the following components:   B Natriuretic Peptide 116.2 (*)    All other components within normal limits  CBC  I-STAT TROPONIN, ED  I-STAT CG4 LACTIC ACID, ED  I-STAT TROPONIN, ED    EKG EKG Interpretation  Date/Time:  Friday April 20 2017 13:30:06 EDT Ventricular Rate:  63 PR Interval:  130 QRS Duration: 136 QT Interval:  462 QTC Calculation: 472 R Axis:   115 Text Interpretation:  Sinus rhythm with Premature supraventricular complexes Right axis deviation Non-specific intra-ventricular conduction block T wave abnormality, consider inferior ischemia T wave abnormality, consider anterolateral ischemia Abnormal ECG No significant change since last tracing Confirmed by Varney Biles [id number]) on 04/20/2017 11:13:17 PM   Radiology No results found.  Procedures Procedures (including critical care time)  Medications Ordered in ED Medications - No data to display   Initial Impression / Assessment and Plan / ED Course  I have reviewed the triage vital signs and the nursing notes.  Pertinent labs & imaging results that were available during my care of the patient were reviewed by me and considered in my medical decision making (see chart for details).     77 year old male with history of CAD, carotid  artery stenosis comes in  with chief complaint of tremors.  Patient also states that he has had shortness of breath with exertion and chest discomfort that has been going on for a long period of time.  Patient's EKG is normal.  Patient is not having any acute chest pain.  Patient's exertional dyspnea has been progressively getting worse, but there has been no new changes over the recent few days, therefore I think he can follow-up with cardiology for outpatient workup.  Differential diagnosis would include worsening CHF or ACS.  Chest x-ray looks fine.  Additionally, patient also has tremors in bilateral upper extremities.  Interestingly he has tremors at rest, which does get worse when I am doing the finger-to-nose exam.  Patient is also complaining of tremors with activities.  Differential diagnosis would include intentional tremor versus essential tremor.  I do not think the symptoms are Parkinson's type, patient is waited for more than 10 hours to be seen we will consult neurology for the tremors.  He needs outpatient workup.  Late entry: Neurology clears the patient.  We will discharge with recommendation to f/u with Cardiology.   Final Clinical Impressions(s) / ED Diagnoses   Final diagnoses:  Tremor  Chest pain, unspecified type    ED Discharge Orders    None       Varney Biles, MD 04/24/17 2131

## 2017-04-21 DIAGNOSIS — R251 Tremor, unspecified: Secondary | ICD-10-CM | POA: Diagnosis not present

## 2017-04-21 DIAGNOSIS — G20 Parkinson's disease: Secondary | ICD-10-CM | POA: Diagnosis not present

## 2017-04-21 LAB — I-STAT TROPONIN, ED: Troponin i, poc: 0 ng/mL (ref 0.00–0.08)

## 2017-04-21 LAB — BRAIN NATRIURETIC PEPTIDE: B Natriuretic Peptide: 116.2 pg/mL — ABNORMAL HIGH (ref 0.0–100.0)

## 2017-04-21 NOTE — Discharge Instructions (Addendum)
Please see the Cardiologist and Neurologist as requested.  Please return to the ER if you have worsening chest pain, shortness of breath, pain radiating to your jaw, shoulder, or back, sweats or fainting.   Please be careful with walking to prevent any falls.

## 2017-04-21 NOTE — ED Notes (Signed)
Neurologist at bedside. 

## 2017-04-21 NOTE — Consult Note (Addendum)
NEURO HOSPITALIST CONSULT NOTE   Requesting physician: Dr. Kathrynn Cade  Reason for Consult: Severe tremor  History obtained from:  Patient and Family  HPI:                                                                                                                                          [name] [name] is an 77 y.o. male presenting to the ED with a one week history of worsening appendicular tremors in conjunction with an unpleasant new sensation of intermittent "full body" tremor when at rest. Also was complaining of SOB, some chest pain and sharp paikns in his head. He has been to the ED more than once in the past 2 weeks for the tremors, without a diagnosis.   On further interview, the patient endorses a history of occasional formed visual hallucinations for about the past 2 years, such as seeing flowers on the wall and seeing his dead wife. He also has a history of thrashing in his sleep per family. He does not have a shuffling gait but does have occasional resting tremor involving the hand. He has had gradually worsening memory and occasional confusion with definite tendency to have "good days and bad days" per family, but no florid waxing and waning of cognition. His gait is at times unsteady. He at times feels dizzy when standing, but unable to describe this as vertigo or a presyncopal sensation. Has had digestive issues worsening over time as well as frequent urination. Does not endorse spells of florid autonomic dysfunction such as very dry skin, temperature dysregulation or spells of uncontrollable sweating.   Lab review: WBC normal, BUN 15, Cr elevated at 1.28 with eGFR of 53. Na, K anc Ca normal. Troponin and lactate normal. Glucose 118. EKG NSR.   His PMHx includes atrial fibrillation, carotid stenosis s/p left CEA, chronic chest pain, chronic LBP, CAD, prior episode of daily headache in 2015, HLD, and HTN.   Past Medical History:  Diagnosis Date  . Anemia  10/06/2011  . Anginal pain ([place])   . Arthritis    "joints hurt; shoulders, arms, back" (08/13/2013)  . Atrial fibrillation ([place])   . Carotid stenosis 04/10/11   a. s/p left carotid endarterectomy 04/14/2011.;  b.  Carotid US (11/13):  L CEA ok; RICA 1-39%  . Chronic bronchitis ([place])    "get it q yr"  . Chronic chest pain   . Chronic lower back pain   . Colon polyp    adenomatous  . Coronary artery disease    a. S/p CABG in 2001. b. S/p DES to protected LM and BMS to RCA 2006. c. 12/2009: s/p DES to Cx;  d. 09/2011 Cath: patent stents, patent grafts -->Med Rx.;  e. CP with abnl Nuc => LHC 10/04/12:  oLM 30%, dLM stent into the CFX patent, LAD occluded, LIMA-LAD patent, RI 30%, mid AVCFX 30%, oOM1 50%, oRCA stent patent, mRCA 50-60%, Radial graft-Dx patent; EF 55%.=> Med Rx  . Daily headache    "for the last couple weeks" (08/13/2013)  . Diverticulosis   . Esophageal stricture   . GERD (gastroesophageal reflux disease)   . Hemorrhoids   . Hiatal hernia   . Hyperlipidemia   . Hypertension   . Left bundle branch block   . Pancreatitis Dec 2011   ERCP ok.  . Prostate cancer [facility])    s/p cryoablation  . Renal cyst    Seen on CT 08/2011 also with circumferential bladder wall thickening  . Skin cancer    "cut it off my right ear"  . Statin intolerance     Past Surgical History:  Procedure Laterality Date  . CARDIAC CATHETERIZATION    . CHOLECYSTECTOMY    . COLONOSCOPY    . CORONARY ANGIOPLASTY WITH STENT PLACEMENT     "1 + 1"  . CORONARY ARTERY BYPASS GRAFT  2001  . ENDARTERECTOMY  04/14/2011   Procedure: ENDARTERECTOMY CAROTID;  Surgeon: Mal Misty, MD;  Location: [facility];  Service: Vascular;  Laterality: Left;  Would like to perform procedure first, at 0730  . EYE SURGERY     cataract  . INGUINAL HERNIA REPAIR Bilateral   . LEFT HEART CATHETERIZATION WITH CORONARY/GRAFT ANGIOGRAM N/A 10/05/2011   Procedure: LEFT HEART CATHETERIZATION WITH Beatrix Fetters;  Surgeon: Hillary Bow, MD;  Location: [facility] CATH LAB;  Service: Cardiovascular;  Laterality: N/A;  . LEFT HEART CATHETERIZATION WITH CORONARY/GRAFT ANGIOGRAM N/A 10/04/2012   Procedure: LEFT HEART CATHETERIZATION WITH Beatrix Fetters;  Surgeon: Jolaine Artist, MD;  Location: [facility] CATH LAB;  Service: Cardiovascular;  Laterality: N/A;  . POLYPECTOMY    . PR VEIN BYPASS GRAFT,AORTO-FEM-POP    . PROSTATE CRYOABLATION      Family History  Problem Relation Age of Onset  . Heart disease Mother        Heart Disease before age 60  . Hypertension Mother   . Heart attack Mother   . Lung cancer Father   . Hypertension Sister   . Heart disease Sister        Heart Disease before age 67  . Cancer Sister   . Heart attack Sister   . Hypertension Brother   . Heart disease Brother        Heart Disease before age 9  . Heart attack Brother   . Colon cancer Neg Hx               Social History:  reports that he has quit smoking. His smoking use included cigarettes. He quit after 0.10 years of use. His smokeless tobacco use includes chew. He reports that he does not drink alcohol or use drugs.  Allergies  Allergen Reactions  . Shellfish Allergy Anaphylaxis  . Zolpidem Tartrate Other (See Comments)    hallucinations  . Amoxicillin-Pot Clavulanate Other (See Comments)    REACTION: 'Burns' Stomach  . Codeine Nausea And Vomiting  . Erythromycin Diarrhea, Nausea And Vomiting and Other (See Comments)    All mycins cause upset stomach  . Hydrocodone Nausea And Vomiting  . Morphine Nausea And Vomiting  . Nitrofuran Derivatives Other (See Comments)    unknown  . Oxycodone Hcl Nausea And Vomiting  . Tramadol Nausea And Vomiting    HOME MEDICATIONS:  ROS:                                                                                                                                         As per HPI.    Blood pressure (!) 146/71, pulse 60, temperature 97.7 F (36.5 C), temperature source Oral, resp. rate 12, SpO2 100 %.   General Examination:                                                                                                      HEENT-  [place]/AT  Lungs- Respirations unlabored Extremities- Warm and well perfused  Neurological Examination Mental Status: Alert, oriented to self, location, city, state, month but not year. Thought content appropriate. Speech fluent with intact comprehension, but with some difficulty naming less common objects. Repetition mildly impaired.  Cranial Nerves: II: Visual fields intact bilaterally. No extinction to DSS. PERRL.   III,IV, VI: Ptosis not present. No nystagmus. Horizontal EOM, upgaze and downgaze intact.  V,VII: Smile symmetric, facial temp sensation decreased bilaterally VIII: hearing intact to voice IX,X: Palate rises symmetrically XI: bilateral shoulder shrug is symmetric XII: midline tongue extension Motor: Right : Upper extremity   5/5    Left:     Upper extremity   5/5  Lower extremity   5/5     Lower extremity   5/5 Cogwheeling present bilateral upper and lower extremities.  Subtle pill rolling tremor to left hand noted once during interview.  Sensory: Temp and light touch intact x 4 without extinction Deep Tendon Reflexes: 3+ patellae bilaterally. Otherwise, 2+  symmetric bilateral upper and lower extremities, including normoactive achilles reflexes.  Plantars: Right: downgoing  Left: downgoing Cerebellar: No ataxia with FNF or H-S bilaterally.  Action and intention tremor with FNF is noted bilaterally.  Head tremor is triggered by standing.  When holding patients shoulders, subtle palpable tremor involving the trunk is present when standing.  Gait: Able to stand with own power. Mild wavering with Romberg. Able to march in place normally. Narrow based.    Lab Results: Basic Metabolic Panel: Recent Labs  Lab  04/20/17 1317  NA 141  K 3.9  CL 106  CO2 26  GLUCOSE 118*  BUN 15  CREATININE 1.28*  CALCIUM 8.9    CBC: Recent Labs  Lab 04/20/17 1317  WBC 8.9  HGB 14.3  HCT 41.7  MCV 89.9  PLT 213    Cardiac Enzymes: No results for input(s): CKTOTAL, CKMB, CKMBINDEX, TROPONINI  in the last 168 hours.  Lipid Panel: No results for input(s): CHOL, TRIG, HDL, CHOLHDL, VLDL, LDLCALC in the last 168 hours.  Imaging: Dg Chest 2 View  Result Date: 04/20/2017 CLINICAL DATA:  Shortness of breath EXAM: CHEST - 2 VIEW COMPARISON:  Chest x-rays dated 04/16/2017 and 08/08/2016. FINDINGS: Mild cardiomegaly is stable. Overall cardiomediastinal silhouette is stable. Median sternotomy wires appear intact and stable in alignment with associated surgical changes of CABG. Lungs are clear. No pleural effusion or pneumothorax seen. No acute or suspicious osseous finding. IMPRESSION: No active cardiopulmonary disease. No evidence of pneumonia or pulmonary edema. Electronically Signed   By: Franki Cabot M.D.   On: 04/20/2017 14:03   Assessment: 77 year old male with 2 week history of "whole body tremor" and worsened intention tremor. 1. Has a history of intermittent intention tremor made worse with coffee and better with Ativan. The past 2 weeks is the longest it has lasted and the most intense it has ever been, per patient. The whole body tremor sensation is new. 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 His dizzy spells on standing may represent early symptoms of autonomic instability. His cognitive dysfunction may be secondary to a synucleinopathy mor likely than a tauopathy given waxing-waning nature of symptoms per family. Unclear whether this would be classifiable as an MSA subtype, early Lewy body disease or early Parkinson's disease.  2. History of atrial fibrillation and vascular disease. No  evidence for stroke on exam. Not on an anticoagulant. Will need to have this addressed as an outpatient, but may be a falls risk.   Recommendations: 1. Discontinue Ativan as patients taking benzodiazepines over extended periods of time are at increased risk for dementia, per the literature. Benefits of Ativan for amelioration of tremor most likely outweighed by risk of worsened cognitive function.  2. Avoid caffeine. Tremors will likely improve.  3. Will need to see a movement disorders specialist at [facility] or [facility].  4. Patient and family education provided.   Electronically signed: Dr. Kerney Elbe 04/21/2017, 12:48 AM

## 2017-04-24 ENCOUNTER — Encounter: Payer: Self-pay | Admitting: Neurology

## 2017-04-25 ENCOUNTER — Telehealth: Payer: Self-pay | Admitting: Cardiology

## 2017-04-25 NOTE — Telephone Encounter (Signed)
Closed Encounter  °

## 2017-05-09 NOTE — Progress Notes (Signed)
[name] [name] was seen today in the movement disorders clinic for neurologic consultation at the request of Varney Biles, MD.  The consultation is for the evaluation of tremor.  Records are reviewed.  Patient was seen in the emergency room and neurology was consulted on April 21, 2017.  The emergency room physician was concerned for Parkinson's disease.  The neurologist felt that this could potentially be an atypical parkinsonian state.  It was recommended that he discontinue his Ativan and follow-up here.   Specific Symptoms:  Tremor: Yes.  , had tremor for a "while" but it was one time every 4-5 months but now it seems like it is daily.  Feels like shaking all over, and feels tremor on the inside.  Nothing increases the tremor.  Has trouble with shaving when shakes.  Drinks 3-4 cups coffee/day but doesn't think that it affects tremor.  Doesn't drink EtOH Family hx of similar:  No. Voice: ? More hoarse than in the past Sleep: may or may not sleep well  Vivid Dreams:  No., wife died a year ago and has seen her in the dreams  Acting out dreams:  No. Wet Pillows: No. Postural symptoms:  Yes.    Falls?  No. Bradykinesia symptoms: slow movements and difficulty getting out of a chair Loss of smell:  No. Loss of taste:  Yes.   - "food doesn't taste like it used to." Urinary Incontinence:  No., does have urinary urgency ever since tx for prostate CA Difficulty Swallowing:  sometimes Handwriting, micrographia: cannot read or write so unknown Trouble with ADL's:  No.  Trouble buttoning clothing: No. Depression:  Yes.   - some since death of wife a year ago Memory changes:  Yes.   - able to remember own medications; only drives a little around home but not around [place] N/V:  Yes.  , for last 2 days had some nausea. Lightheaded:  Yes.   - "I'm a little dizzy all the time"  Syncope: No. Diplopia:  "I think so"  Neuroimaging of the brain has previously been performed.  He believes it was done  at [place] years ago for possible mini stroke.   It is not available for my review today.  PREVIOUS MEDICATIONS: none to date  ALLERGIES:   Allergies  Allergen Reactions  . Shellfish Allergy Anaphylaxis  . Zolpidem Tartrate Other (See Comments)    hallucinations  . Amoxicillin-Pot Clavulanate Other (See Comments)    REACTION: 'Burns' Stomach  . Codeine Nausea And Vomiting  . Erythromycin Diarrhea, Nausea And Vomiting and Other (See Comments)    All mycins cause upset stomach  . Hydrocodone Nausea And Vomiting  . Morphine Nausea And Vomiting  . Nitrofuran Derivatives Other (See Comments)    unknown  . Oxycodone Hcl Nausea And Vomiting  . Tramadol Nausea And Vomiting    CURRENT MEDICATIONS:  Outpatient Encounter Medications as of 05/15/2017  Medication Sig  . amLODipine (NORVASC) 10 MG tablet Take 10 mg by mouth daily.  Marland Kitchen aspirin EC 81 MG tablet Take 81 mg by mouth at bedtime.   . famotidine (PEPCID) 20 MG tablet Take 1 tablet (20 mg total) by mouth at bedtime.  . gabapentin (NEURONTIN) 300 MG capsule Take 300 mg by mouth daily.  . isosorbide dinitrate (ISORDIL) 30 MG tablet Take 1 tablet (30 mg total) by mouth 2 (two) times daily.  Marland Kitchen LORazepam (ATIVAN) 0.5 MG tablet Take 0.5 mg by mouth daily as needed for anxiety.  Marland Kitchen  meclizine (ANTIVERT) 12.5 MG tablet Take 1 tablet (12.5 mg total) by mouth 3 (three) times daily as needed for dizziness.  . metoprolol tartrate (LOPRESSOR) 50 MG tablet Take 50 mg by mouth 2 (two) times daily.  . ondansetron (ZOFRAN ODT) 4 MG disintegrating tablet Take 1 tablet (4 mg total) by mouth every 8 (eight) hours as needed for nausea or vomiting.  . pantoprazole (PROTONIX) 40 MG tablet Take 40 mg by mouth 2 (two) times daily.   Marland Kitchen PARoxetine (PAXIL) 20 MG tablet Take 20 mg by mouth daily.  . nitroGLYCERIN (NITROSTAT) 0.4 MG SL tablet Place 1 tablet (0.4 mg total) under the tongue every 5 (five) minutes as needed. For chest pain (Patient not taking: Reported  on 05/15/2017)  . [DISCONTINUED] metoprolol tartrate (LOPRESSOR) 25 MG tablet Take 1 tablet (25 mg total) by mouth 2 (two) times daily. (Patient taking differently: Take 50 mg by mouth 2 (two) times daily. )  . [DISCONTINUED] PARoxetine (PAXIL) 10 MG tablet Take 10 mg by mouth daily.  . [DISCONTINUED] sucralfate (CARAFATE) 1 GM/10ML suspension Take 10 mLs (1 g total) by mouth 4 (four) times daily -  with meals and at bedtime.   No facility-administered encounter medications on file as of 05/15/2017.     PAST MEDICAL HISTORY:   Past Medical History:  Diagnosis Date  . Anemia 10/06/2011  . Anginal pain ([place])   . Arthritis    "joints hurt; shoulders, arms, back" (08/13/2013)  . Atrial fibrillation ([place])   . Carotid stenosis 04/10/11   a. s/p left carotid endarterectomy 04/14/2011.;  b.  Carotid US (11/13):  L CEA ok; RICA 1-39%  . Chronic bronchitis ([place])    "get it q yr"  . Chronic chest pain   . Chronic lower back pain   . Colon polyp    adenomatous  . Coronary artery disease    a. S/p CABG in 2001. b. S/p DES to protected LM and BMS to RCA 2006. c. 12/2009: s/p DES to Cx;  d. 09/2011 Cath: patent stents, patent grafts -->Med Rx.;  e. CP with abnl Nuc => LHC 10/04/12: oLM 30%, dLM stent into the CFX patent, LAD occluded, LIMA-LAD patent, RI 30%, mid AVCFX 30%, oOM1 50%, oRCA stent patent, mRCA 50-60%, Radial graft-Dx patent; EF 55%.=> Med Rx  . Daily headache    "for the last couple weeks" (08/13/2013)  . Diverticulosis   . Esophageal stricture   . GERD (gastroesophageal reflux disease)   . Hemorrhoids   . Hiatal hernia   . Hyperlipidemia   . Hypertension   . Left bundle branch block   . Pancreatitis Dec 2011   ERCP ok.  . Prostate cancer [facility])    s/p cryoablation  . Renal cyst    Seen on CT 08/2011 also with circumferential bladder wall thickening  . Skin cancer    "cut it off my right ear"  . Statin intolerance     PAST SURGICAL HISTORY:   Past Surgical History:  Procedure  Laterality Date  . CARDIAC CATHETERIZATION    . CHOLECYSTECTOMY    . COLONOSCOPY    . CORONARY ANGIOPLASTY WITH STENT PLACEMENT     "1 + 1"  . CORONARY ARTERY BYPASS GRAFT  2001  . ENDARTERECTOMY  04/14/2011   Procedure: ENDARTERECTOMY CAROTID;  Surgeon: Mal Misty, MD;  Location: [place];  Service: Vascular;  Laterality: Left;  Would like to perform procedure first, at 0730  . EYE SURGERY     cataract  .  INGUINAL HERNIA REPAIR Bilateral   . LEFT HEART CATHETERIZATION WITH CORONARY/GRAFT ANGIOGRAM N/A 10/05/2011   Procedure: LEFT HEART CATHETERIZATION WITH Beatrix Fetters;  Surgeon: Hillary Bow, MD;  Location: [facility] C[facility] LAB;  Service: Cardiovascular;  Laterality: N/A;  . LEFT HEART CATHETERIZATION WITH CORONARY/GRAFT ANGIOGRAM N/A 10/04/2012   Procedure: LEFT HEART CATHETERIZATION WITH Beatrix Fetters;  Surgeon: Jolaine Artist, MD;  Location[facility] C[facility] LAB;  Service: Cardiovascular;  Laterality: N/A;  . POLYPECTOMY    . PR VEIN BYPASS GRAFT,AORTO-FEM-POP    . PROSTATE CRYOABLATION      SOCIAL HISTORY:   Social History   Socioeconomic History  . Marital status: Widowed    Spouse name: Not on file  . Number of children: 2  . Years of education: Not on file  . Highest education level: Not on file  Occupational History  . Occupation: Retired  Scientific laboratory technician  . Financial resource strain: Not on file  . Food insecurity:    Worry: Not on file    Inability: Not on file  . Transportation needs:    Medical: Not on file    Non-medical: Not on file  Tobacco Use  . Smoking status: Former Smoker    Years: 0.10    Types: Cigarettes  . Smokeless tobacco: Current User    Types: Chew  . Tobacco comment: "smoked a few cigarettes; no more than 1 month"  Substance and Sexual Activity  . Alcohol use: No    Alcohol/week: 0.0 oz    Comment: "no alcohol since I was a teenager"  . Drug use: No  . Sexual activity: Never  Lifestyle  . Physical activity:    Days per  week: Not on file    Minutes per session: Not on file  . Stress: Not on file  Relationships  . Social connections:    Talks on phone: Not on file    Gets together: Not on file    Attends religious service: Not on file    Active member of club or organization: Not on file    Attends meetings of clubs or organizations: Not on file    Relationship status: Not on file  . Intimate partner violence:    Fear of current or ex partner: Not on file    Emotionally abused: Not on file    Physically abused: Not on file    Forced sexual activity: Not on file  Other Topics Concern  . Not on file  Social History Narrative   Patient is illiterate. He cannot read or write. He left school at about the seventh grade.   As of 10/2015 he reports that his wife is chronically ill at home with heart disease and COPD and is under hospice care. The bulk of the care is given by the patient and daughter.    FAMILY HISTORY:   Family Status  Relation Name Status  . Mother  Deceased  . Father  Deceased  . MGM  Deceased  . MGF  Deceased  . PGM  Deceased  . PGF  Deceased  . Sister 2 Deceased  . Brother 2 Deceased  . Daughter 2 Alive  . Neg Hx  (Not Specified)    ROS:  Chronic CP/SOB.  Has appt with cardiology tomorrow (hasn't been there in years).  A complete 10 system review of systems was obtained and was unremarkable apart from what is mentioned above.  PHYSICAL EXAMINATION:    VITALS:   Vitals:   05/15/17 1326  BP: 120/70  Pulse: 60  SpO2: 97%  Weight: 138 lb (62.6 kg)  Height: 5\' 4"  (1.626 m)    GEN:  The patient appears stated age and is in NAD. HEENT:  Normocephalic, atraumatic.  The mucous membranes are moist. The superficial temporal arteries are without ropiness or tenderness. CV:  RRR Lungs:  CTAB Neck/HEME:  There are no carotid bruits bilaterally.  Neurological examination:  Orientation: The patient is alert and oriented x3. Fund of knowledge is appropriate.  Recent and remote  memory are intact.  Attention and concentration are normal.    Able to name objects and repeat phrases. Cranial nerves: There is good facial symmetry. The left pupil is 16mm and R is 85mm and both are nonreactive.  Fundoscopic exam is attempted but the disc margins are not well visualized bilaterally.Extraocular muscles are intact. The visual fields are full to confrontational testing. The speech is fluent and clear. Soft palate rises symmetrically and there is no tongue deviation. Hearing is intact to conversational tone. Sensation: Sensation is intact to light and pinprick throughout (facial, trunk, extremities). Vibration is intact at the bilateral big toe but decreased. There is no extinction with double simultaneous stimulation. There is no sensory dermatomal level identified. Motor: Strength is 5/5 in the bilateral upper and lower extremities.   Shoulder shrug is equal and symmetric.  There is no pronator drift. Deep tendon reflexes: Deep tendon reflexes are 2/4 at the bilateral biceps, triceps, brachioradialis, patella and achilles. Plantar responses are downgoing bilaterally.  Movement examination: Tone: There is normal tone in the bilateral upper extremities.  The tone in the lower extremities is normal.  Abnormal movements: There is no rest tremor.  There is minimal intention tremor, L more than R.  No significant trouble with archimedes spirals.   Coordination:  There is no decremation with RAM's, with any form of RAMS, including alternating supination and pronation of the forearm, hand opening and closing, finger taps, heel taps and toe taps. Gait and Station: The patient has no difficulty arising out of a deep-seated chair without the use of the hands. The patient's gait is short stepped, but wide based and mildly unsteady.     Lab Results  Component Value Date   TSH 3.462 10/03/2012     Chemistry      Component Value Date/Time   NA 141 04/20/2017 1317   K 3.9 04/20/2017 1317   CL 106  04/20/2017 1317   CO2 26 04/20/2017 1317   BUN 15 04/20/2017 1317   CREATININE 1.28 (H) 04/20/2017 1317   CREATININE 1.26 04/07/2011 1549      Component Value Date/Time   CALCIUM 8.9 04/20/2017 1317   ALKPHOS 59 08/08/2016 2242   AST 18 08/08/2016 2242   ALT 14 (L) 08/08/2016 2242   BILITOT 1.5 (H) 08/08/2016 2242     Lab Results  Component Value Date   HGBA1C  01/07/2010    5.4 (NOTE)                                                                       According to the ADA Clinical Practice Recommendations for 2011, when HbA1c is used as a screening test:   >=6.5%   Diagnostic of Diabetes Mellitus           (  if abnormal result  is confirmed)  5.7-6.4%   Increased risk of developing Diabetes Mellitus  References:Diagnosis and Classification of Diabetes Mellitus,Diabetes MOQH,4765,46(TKPTW 1):S62-S69 and Standards of Medical Care in         Diabetes - 2011,Diabetes SFKC,1275,17  (Suppl 1):S11-S61.   Lab Results  Component Value Date   VITAMINB12 193 (L) 10/04/2011     ASSESSMENT/PLAN:  1.  Tremor.  -This may be very mild ET but I saw no evidence of a neurodegenerative process causing this, which was reason for referral.  Reassurance was provided to the patient.  - We discussed nature and pathophysiology.  We discussed that this can continue to gradually get worse with time.  We discussed that some medications can worsen this, as can caffeine use.  We discussed medication therapy as well as surgical therapy.  The patient is interested in taking med but we decided to hold until we get results of blood work.  If we do decide on med, it will be primidone  -will check HgA1C (last blood glu was 112), TSH   2.  b12 deficiency  -was deficient in B12 some years ago.  Isn't taking a supplement.  Will order  3.  Chew tobacco  -reports that he has been chewing since age of 77 y/o.  Talked about importance of d/c tobacco  4.  Lightheadedness  -wonder if due to metoprolol.  Reports that  dizziness and tremor start after takes meds in the AM.  He is seeing Dr. [name] for f/u tomorrow and reports it has been a long time since he has seen him.  5.  F/u will depend on the above.  Safety discussed.  Much greater than 50% of this visit was spent in counseling and coordinating care.  Total face to face time:  45 min  Cc:  Cyndi Bender, PA-C

## 2017-05-11 ENCOUNTER — Ambulatory Visit: Payer: Medicare Other | Admitting: Vascular Surgery

## 2017-05-11 ENCOUNTER — Encounter (HOSPITAL_COMMUNITY): Payer: Medicare Other

## 2017-05-15 ENCOUNTER — Encounter: Payer: Self-pay | Admitting: Neurology

## 2017-05-15 ENCOUNTER — Other Ambulatory Visit: Payer: Medicare Other

## 2017-05-15 ENCOUNTER — Ambulatory Visit (INDEPENDENT_AMBULATORY_CARE_PROVIDER_SITE_OTHER): Payer: Medicare Other | Admitting: Neurology

## 2017-05-15 VITALS — BP 120/70 | HR 60 | Ht 64.0 in | Wt 138.0 lb

## 2017-05-15 DIAGNOSIS — R739 Hyperglycemia, unspecified: Secondary | ICD-10-CM | POA: Diagnosis not present

## 2017-05-15 DIAGNOSIS — E538 Deficiency of other specified B group vitamins: Secondary | ICD-10-CM | POA: Diagnosis not present

## 2017-05-15 DIAGNOSIS — R251 Tremor, unspecified: Secondary | ICD-10-CM

## 2017-05-15 DIAGNOSIS — R42 Dizziness and giddiness: Secondary | ICD-10-CM

## 2017-05-15 LAB — TSH: TSH: 1.68 mIU/L (ref 0.40–4.50)

## 2017-05-15 LAB — VITAMIN B12: VITAMIN B 12: 261 pg/mL (ref 200–1100)

## 2017-05-15 NOTE — Progress Notes (Signed)
Cardiology Office Note   Date:  05/16/2017   ID:  [name] [name], DOB 05/17/1940, MRN [id number]  PCP:  Cyndi Bender, PA-C  Cardiologist:  [facility]   Chief Complaint  Patient presents with  . Follow-up  . Dizziness     History of Present Illness: [name] [name] is a 77 y.o. male who presents for ongoing assessment and management of chronic chest pain, with hx of CABG, hypertension, atrial fibrillation, carotid artery stenosis, chronic headaches and hypercholesterolemia.  He was recently seen in the emergency room on 04/20/2017 with complaints of chest pain and tremors.  He stated that his chest pain was different from prior cardiac pain.  EKG was not significantly changed from prior EKG. there was no evidence of CHF. He ws referred to neurology for outpatient work up.   NM  Study 08/14/2013,  1. Similar appearance to prior with septal hypokinesis and suspected scar along the cardiac apex anteroseptal wall. No inducible ischemia. Ejection fraction stable at 60%.  On office visit today he continues to have complaints of chest pain which she states is constant feels like pressure coming up from his stomach.  Constant burping.  The patient is on a PPI and drinks a lot of coffee.  He also complains of chronic dizziness especially in the morning after getting out of bed or going from a seated position to a standing position.  He is on meclizine which she takes but states that it seems to make him feel worse.  Past Medical History:  Diagnosis Date  . Anemia 10/06/2011  . Anginal pain ([place])   . Arthritis    "joints hurt; shoulders, arms, back" (08/13/2013)  . Atrial fibrillation ([place])   . Carotid stenosis 04/10/11   a. s/p left carotid endarterectomy 04/14/2011.;  b.  Carotid US (11/13):  L CEA ok; RICA 1-39%  . Chronic bronchitis ([place])    "get it q yr"  . Chronic chest pain   . Chronic lower back pain   . Colon polyp    adenomatous  . Coronary artery disease    a. S/p CABG in 2001. b.  S/p DES to protected LM and BMS to RCA 2006. c. 12/2009: s/p DES to Cx;  d. 09/2011 Cath: patent stents, patent grafts -->Med Rx.;  e. CP with abnl Nuc => LHC 10/04/12: oLM 30%, dLM stent into the CFX patent, LAD occluded, LIMA-LAD patent, RI 30%, mid AVCFX 30%, oOM1 50%, oRCA stent patent, mRCA 50-60%, Radial graft-Dx patent; EF 55%.=> Med Rx  . Daily headache    "for the last couple weeks" (08/13/2013)  . Diverticulosis   . Esophageal stricture   . GERD (gastroesophageal reflux disease)   . Hemorrhoids   . Hiatal hernia   . Hyperlipidemia   . Hypertension   . Left bundle branch block   . Pancreatitis Dec 2011   ERCP ok.  . Prostate cancer [facility])    s/p cryoablation  . Renal cyst    Seen on CT 08/2011 also with circumferential bladder wall thickening  . Skin cancer    "cut it off my right ear"  . Statin intolerance     Past Surgical History:  Procedure Laterality Date  . CARDIAC CATHETERIZATION    . CHOLECYSTECTOMY    . COLONOSCOPY    . CORONARY ANGIOPLASTY WITH STENT PLACEMENT     "1 + 1"  . CORONARY ARTERY BYPASS GRAFT  2001  . ENDARTERECTOMY  04/14/2011   Procedure: ENDARTERECTOMY CAROTID;  Surgeon: Nelda Severe  Kellie Simmering, MD;  Location: [place];  Service: Vascular;  Laterality: Left;  Would like to perform procedure first, at 0730  . EYE SURGERY     cataract  . INGUINAL HERNIA REPAIR Bilateral   . LEFT HEART CATHETERIZATION WITH CORONARY/GRAFT ANGIOGRAM N/A 10/05/2011   Procedure: LEFT HEART CATHETERIZATION WITH Beatrix Fetters;  Surgeon: Hillary Bow, MD;  Location: [facility] CATH LAB;  Service: Cardiovascular;  Laterality: N/A;  . LEFT HEART CATHETERIZATION WITH CORONARY/GRAFT ANGIOGRAM N/A 10/04/2012   Procedure: LEFT HEART CATHETERIZATION WITH Beatrix Fetters;  Surgeon: Jolaine Artist, MD;  Location: [facility] C[facility] LAB;  Service: Cardiovascular;  Laterality: N/A;  . POLYPECTOMY    . PR VEIN BYPASS GRAFT,AORTO-FEM-POP    . PROSTATE CRYOABLATION       Current Outpatient  Medications  Medication Sig Dispense Refill  . amLODipine (NORVASC) 10 MG tablet Take 10 mg by mouth daily.    Marland Kitchen aspirin EC 81 MG tablet Take 81 mg by mouth at bedtime.     . famotidine (PEPCID) 20 MG tablet Take 1 tablet (20 mg total) by mouth at bedtime. 30 tablet 0  . gabapentin (NEURONTIN) 300 MG capsule Take 300 mg by mouth daily.  1  . isosorbide dinitrate (ISORDIL) 30 MG tablet Take 1 tablet (30 mg total) by mouth 2 (two) times daily. 60 tablet 0  . LORazepam (ATIVAN) 0.5 MG tablet Take 0.5 mg by mouth daily as needed for anxiety.  2  . meclizine (ANTIVERT) 12.5 MG tablet Take 1 tablet (12.5 mg total) by mouth 3 (three) times daily as needed for dizziness. 30 tablet 0  . metoprolol tartrate (LOPRESSOR) 50 MG tablet Take 50 mg by mouth 2 (two) times daily.  0  . nitroGLYCERIN (NITROSTAT) 0.4 MG SL tablet Place 1 tablet (0.4 mg total) under the tongue every 5 (five) minutes as needed. For chest pain 25 tablet 3  . ondansetron (ZOFRAN ODT) 4 MG disintegrating tablet Take 1 tablet (4 mg total) by mouth every 8 (eight) hours as needed for nausea or vomiting. 20 tablet 0  . pantoprazole (PROTONIX) 40 MG tablet Take 40 mg by mouth 2 (two) times daily.     Marland Kitchen PARoxetine (PAXIL) 20 MG tablet Take 20 mg by mouth daily.  5  . Simethicone 125 MG CAPS Take 1 capsule (125 mg total) by mouth daily as needed. 30 each 11   No current facility-administered medications for this visit.     Allergies:   Shellfish allergy; Zolpidem tartrate; Amoxicillin-pot clavulanate; Codeine; Erythromycin; Hydrocodone; Morphine; Nitrofuran derivatives; Oxycodone hcl; and Tramadol    Social History:  The patient  reports that he has quit smoking. His smoking use included cigarettes. He quit after 0.10 years of use. His smokeless tobacco use includes chew. He reports that he does not drink alcohol or use drugs.   Family History:  The patient's family history includes Cancer in his sister; Heart attack in his brother,  mother, and sister; Heart disease in his brother, mother, and sister; Hypertension in his brother, mother, and sister; Lung cancer in his father.    ROS: All other systems are reviewed and negative. Unless otherwise mentioned in H&P    PHYSICAL EXAM: VS:  BP (!) 142/76   Pulse 60   Ht 5\' 4"  (1.626 m)   Wt 139 lb 3.2 oz (63.1 kg)   BMI 23.89 kg/m  , BMI Body mass index is 23.89 kg/m. GEN: Well nourished, well developed, in no acute distress  HEENT: normal  Neck: no JVD, carotid bruits, or masses Cardiac: RRR; no murmurs, rubs, or gallops,no edema  Respiratory:  clear to auscultation bilaterally, normal work of breathing GI: soft, nontender, nondistended, + BS MS: no deformity or atrophy  Skin: warm and dry, no rash Neuro:  Strength and sensation are intact Psych: euthymic mood, full affect   EKG: Not completed during this office visit.  Recent Labs: 08/08/2016: ALT 14 04/20/2017: BUN 15; Creatinine, Ser 1.28; Hemoglobin 14.3; Platelets 213; Potassium 3.9; Sodium 141 04/21/2017: B Natriuretic Peptide 116.2 05/15/2017: TSH 1.68    Lipid Panel    Component Value Date/Time   CHOL 223 (H) 10/03/2012 0543   TRIG 217 (H) 10/03/2012 0543   HDL 30 (L) 10/03/2012 0543   CHOLHDL 7.4 10/03/2012 0543   VLDL 43 (H) 10/03/2012 0543   LDLCALC 150 (H) 10/03/2012 0543   LDLDIRECT 178.8 04/02/2007 0906      Wt Readings from Last 3 Encounters:  05/16/17 139 lb 3.2 oz (63.1 kg)  05/15/17 138 lb (62.6 kg)  02/26/17 140 lb (63.5 kg)      Other studies Reviewed: NUC stress 2016:  Nuclear stress EF: 50%.  The left ventricular ejection fraction is mildly decreased (45-54%).  This is a low risk study.  Low risk stress nuclear study with a medium size, moderate intensity, fixed septal defect suggestive of prior infarct vs LBBB artifact; no ischemia; EF 50 with mild global hypokinesis.   ASSESSMENT AND PLAN:  1.  Chest pain: Atypical for cardiac etiology as it is constant and is  radiating up from his abdomen with constant burping.  I am going to start him on simethicone when she is to take up to 3 times daily to help with symptoms.  He has been seen recently by GI specialist with EGD and colonoscopy in February 2019.  He will continue to be followed by them.  2.  Chronic dizziness: The patient states he notices it most when he is standing up or sitting but lying down he feels much better.  He states he constantly feels his ears popping. His  blood pressure is well controlled and no evidence of orthostasis is found.  He continues on meclizine.  I am going to refer him to ENT for further evaluation of possible causes for his chronic dizziness.   3.  Carotid artery disease: History of carotid endarterectomy, this was completed on 04/14/2011.  He is followed by vein and vascular, and is due for follow-up carotid artery Doppler ultrasound in 1 month.  4.  Hyperlipidemia: Not currently on statin therapy.  He will need fasting lipids and LFTs on follow-up if not completed by primary care.  Most recent labs available are from 10/03/2012.  Current medicines are reviewed at length with the patient today.    Labs/ tests ordered today include: none   Phill Myron. West Pugh, ANP, AACC   05/16/2017 10:57 AM    [facility] [address] [place], [place] [address] Phone: [phone]; Fax: [phone]

## 2017-05-15 NOTE — Patient Instructions (Signed)
1. Your provider has requested that you have labwork completed today. Please go to [facility] ([id number]) on the second floor of this building before leaving the office today. You do not need to check in. If you are not called within 15 minutes please check with the front desk.   

## 2017-05-16 ENCOUNTER — Encounter: Payer: Self-pay | Admitting: Adult Health

## 2017-05-16 ENCOUNTER — Ambulatory Visit (INDEPENDENT_AMBULATORY_CARE_PROVIDER_SITE_OTHER): Payer: Medicare Other | Admitting: Adult Health

## 2017-05-16 ENCOUNTER — Telehealth: Payer: Self-pay | Admitting: Neurology

## 2017-05-16 VITALS — BP 142/76 | HR 60 | Ht 64.0 in | Wt 139.2 lb

## 2017-05-16 DIAGNOSIS — R0789 Other chest pain: Secondary | ICD-10-CM

## 2017-05-16 DIAGNOSIS — I10 Essential (primary) hypertension: Secondary | ICD-10-CM | POA: Diagnosis not present

## 2017-05-16 DIAGNOSIS — I6523 Occlusion and stenosis of bilateral carotid arteries: Secondary | ICD-10-CM | POA: Diagnosis not present

## 2017-05-16 DIAGNOSIS — R42 Dizziness and giddiness: Secondary | ICD-10-CM

## 2017-05-16 LAB — TIQ- MISLABELED

## 2017-05-16 LAB — HEMOGLOBIN A1C
HEMOGLOBIN A1C: 5.4 %{Hb} (ref <5.7)
Mean Plasma Glucose: 108 (calc)
eAG (mmol/L): 6 (calc)

## 2017-05-16 MED ORDER — SIMETHICONE 125 MG PO CAPS: 1.0000 | ORAL_CAPSULE | Freq: Every day | ORAL | 11 refills | Status: DC | PRN

## 2017-05-16 NOTE — Patient Instructions (Signed)
Medication Instructions:  Ok to use Simethicone-GAS-EX AS NEEDED  If you need a refill on your cardiac medications before your next appointment, please call your pharmacy.  Special Instructions: REFER TO DR BATES-[facility]   INCREASE FLUID INTAKE-WATER TRY TO AVOID COFFEE   KEEP SCHEDULED APPTS  Follow-Up: Your physician wants you to follow-up in: [place] should receive a reminder letter in the mail two months in advance. If you do not receive a letter, please call our office 08-2017 to schedule the 10-2017 follow-up appointment.   Thank you for choosing [facility] at [facility]!!

## 2017-05-16 NOTE — Telephone Encounter (Signed)
-----   Message from [place], DO sent at 05/16/2017  7:11 AM EDT ----- Let pt know that B12 is a bit low.  Lets start him on b12 supplements and call him in 2 months to see if he feels any different in terms of tremulousness or if still wants to try med

## 2017-05-16 NOTE — Telephone Encounter (Signed)
Patient's daughter [name] [name]) [phone] lmom returning your call. Thanks

## 2017-05-16 NOTE — Telephone Encounter (Signed)
Left message on machine for patient to call back.

## 2017-05-17 NOTE — Telephone Encounter (Signed)
Left message on machine for patient to call back.

## 2017-05-17 NOTE — Telephone Encounter (Signed)
Patient's daughter made aware and is agreeable to this plan.

## 2017-05-22 DIAGNOSIS — R42 Dizziness and giddiness: Secondary | ICD-10-CM | POA: Diagnosis not present

## 2017-05-22 DIAGNOSIS — R2689 Other abnormalities of gait and mobility: Secondary | ICD-10-CM | POA: Diagnosis not present

## 2017-05-25 ENCOUNTER — Ambulatory Visit (HOSPITAL_COMMUNITY)
Admission: RE | Admit: 2017-05-25 | Discharge: 2017-05-25 | Disposition: A | Discharge status: Home / Self Care | Payer: Medicare Other | Source: Ambulatory Visit | Attending: Vascular Surgery | Admitting: Vascular Surgery

## 2017-05-25 ENCOUNTER — Ambulatory Visit (INDEPENDENT_AMBULATORY_CARE_PROVIDER_SITE_OTHER): Payer: Medicare Other | Admitting: Vascular Surgery

## 2017-05-25 ENCOUNTER — Other Ambulatory Visit: Payer: Self-pay

## 2017-05-25 ENCOUNTER — Encounter: Payer: Self-pay | Admitting: Vascular Surgery

## 2017-05-25 VITALS — BP 149/74 | HR 61 | Resp 20 | Ht 64.0 in | Wt 139.0 lb

## 2017-05-25 DIAGNOSIS — I6523 Occlusion and stenosis of bilateral carotid arteries: Secondary | ICD-10-CM | POA: Insufficient documentation

## 2017-05-25 NOTE — Progress Notes (Signed)
HISTORY AND PHYSICAL     CC:  Yearly exam Requesting Provider:  Leonides Sake, MD  HPI: This is a 77 y.o. male with a hx of left carotid endarterectomy by Dr. Kellie Simmering in 2013 for what was considered symptomatic.  He denies any amaurosis fugax, clumsiness or loss of use of an arm or leg.  He denies any trouble with speech.  He states he has been a little dizzy and went to his PCP and told he was dehydrated and has a vitamin B12 deficiency.  He has been taking a B12 supplement for 3 days but can't tell a difference yet.    He states that he gets cramping in his legs when he walks but denies any wounds on his feet.  He is a non smoker but uses smokeless tobacco.    He takes an aspirin daily.  He is on a CCB and beta blocker for blood pressure control.  He has a hx of CABG in 2001.    He is going to the beach with his daughter this weekend to fish.   Past Medical History:  Diagnosis Date  . Anemia 10/06/2011  . Anginal pain ([place])   . Arthritis    "joints hurt; shoulders, arms, back" (08/13/2013)  . Atrial fibrillation ([place])   . B12 deficiency   . Carotid stenosis 04/10/11   a. s/p left carotid endarterectomy 04/14/2011.;  b.  Carotid US (11/13):  L CEA ok; RICA 1-39%  . Chronic bronchitis ([place])    "get it q yr"  . Chronic chest pain   . Chronic lower back pain   . Colon polyp    adenomatous  . Coronary artery disease    a. S/p CABG in 2001. b. S/p DES to protected LM and BMS to RCA 2006. c. 12/2009: s/p DES to Cx;  d. 09/2011 Cath: patent stents, patent grafts -->Med Rx.;  e. CP with abnl Nuc => LHC 10/04/12: oLM 30%, dLM stent into the CFX patent, LAD occluded, LIMA-LAD patent, RI 30%, mid AVCFX 30%, oOM1 50%, oRCA stent patent, mRCA 50-60%, Radial graft-Dx patent; EF 55%.=> Med Rx  . Daily headache    "for the last couple weeks" (08/13/2013)  . Diverticulosis   . Esophageal stricture   . GERD (gastroesophageal reflux disease)   . Hemorrhoids   . Hiatal hernia   . Hyperlipidemia     . Hypertension   . Left bundle branch block   . Pancreatitis Dec 2011   ERCP ok.  . Prostate cancer [facility])    s/p cryoablation  . Renal cyst    Seen on CT 08/2011 also with circumferential bladder wall thickening  . Skin cancer    "cut it off my right ear"  . Statin intolerance     Past Surgical History:  Procedure Laterality Date  . CARDIAC CATHETERIZATION    . CHOLECYSTECTOMY    . COLONOSCOPY    . CORONARY ANGIOPLASTY WITH STENT PLACEMENT     "1 + 1"  . CORONARY ARTERY BYPASS GRAFT  2001  . ENDARTERECTOMY  04/14/2011   Procedure: ENDARTERECTOMY CAROTID;  Surgeon: Mal Misty, MD;  Location: [place];  Service: Vascular;  Laterality: Left;  Would like to perform procedure first, at 0730  . EYE SURGERY     cataract  . INGUINAL HERNIA REPAIR Bilateral   . LEFT HEART CATHETERIZATION WITH CORONARY/GRAFT ANGIOGRAM N/A 10/05/2011   Procedure: LEFT HEART CATHETERIZATION WITH Beatrix Fetters;  Surgeon: Hillary Bow, MD;  Location:  [place] CATH LAB;  Service: Cardiovascular;  Laterality: N/A;  . LEFT HEART CATHETERIZATION WITH CORONARY/GRAFT ANGIOGRAM N/A 10/04/2012   Procedure: LEFT HEART CATHETERIZATION WITH Beatrix Fetters;  Surgeon: Jolaine Artist, MD;  Location: [facility] CATH LAB;  Service: Cardiovascular;  Laterality: N/A;  . POLYPECTOMY    . PR VEIN BYPASS GRAFT,AORTO-FEM-POP    . PROSTATE CRYOABLATION      Allergies  Allergen Reactions  . Shellfish Allergy Anaphylaxis  . Zolpidem Tartrate Other (See Comments)    hallucinations  . Amoxicillin-Pot Clavulanate Other (See Comments)    REACTION: 'Burns' Stomach  . Codeine Nausea And Vomiting  . Erythromycin Diarrhea, Nausea And Vomiting and Other (See Comments)    All mycins cause upset stomach  . Hydrocodone Nausea And Vomiting  . Morphine Nausea And Vomiting  . Nitrofuran Derivatives Other (See Comments)    unknown  . Oxycodone Hcl Nausea And Vomiting  . Tramadol Nausea And Vomiting    Current Outpatient  Medications  Medication Sig Dispense Refill  . amLODipine (NORVASC) 10 MG tablet Take 10 mg by mouth daily.    Marland Kitchen aspirin EC 81 MG tablet Take 81 mg by mouth at bedtime.     . famotidine (PEPCID) 20 MG tablet Take 1 tablet (20 mg total) by mouth at bedtime. 30 tablet 0  . gabapentin (NEURONTIN) 300 MG capsule Take 300 mg by mouth daily.  1  . isosorbide dinitrate (ISORDIL) 30 MG tablet Take 1 tablet (30 mg total) by mouth 2 (two) times daily. 60 tablet 0  . LORazepam (ATIVAN) 0.5 MG tablet Take 0.5 mg by mouth daily as needed for anxiety.  2  . meclizine (ANTIVERT) 12.5 MG tablet Take 1 tablet (12.5 mg total) by mouth 3 (three) times daily as needed for dizziness. 30 tablet 0  . metoprolol tartrate (LOPRESSOR) 50 MG tablet Take 50 mg by mouth 2 (two) times daily.  0  . nitroGLYCERIN (NITROSTAT) 0.4 MG SL tablet Place 1 tablet (0.4 mg total) under the tongue every 5 (five) minutes as needed. For chest pain 25 tablet 3  . ondansetron (ZOFRAN ODT) 4 MG disintegrating tablet Take 1 tablet (4 mg total) by mouth every 8 (eight) hours as needed for nausea or vomiting. 20 tablet 0  . pantoprazole (PROTONIX) 40 MG tablet Take 40 mg by mouth 2 (two) times daily.     Marland Kitchen PARoxetine (PAXIL) 20 MG tablet Take 20 mg by mouth daily.  5  . Simethicone 125 MG CAPS Take 1 capsule (125 mg total) by mouth daily as needed. 30 each 11   No current facility-administered medications for this visit.     Family History  Problem Relation Age of Onset  . Heart disease Mother        Heart Disease before age 28  . Hypertension Mother   . Heart attack Mother   . Lung cancer Father   . Hypertension Sister   . Heart disease Sister        Heart Disease before age 19  . Cancer Sister   . Heart attack Sister   . Hypertension Brother   . Heart disease Brother        Heart Disease before age 12  . Heart attack Brother   . Colon cancer Neg Hx     Social History   Socioeconomic History  . Marital status: Widowed     Spouse name: Not on file  . Number of children: 2  . Years of education: Not on file  .  Highest education level: Not on file  Occupational History  . Occupation: Retired    Comment: raised Public librarian  . Financial resource strain: Not on file  . Food insecurity:    Worry: Not on file    Inability: Not on file  . Transportation needs:    Medical: Not on file    Non-medical: Not on file  Tobacco Use  . Smoking status: Former Smoker    Years: 0.10    Types: Cigarettes  . Smokeless tobacco: Current User    Types: Chew  . Tobacco comment: "smoked a few cigarettes; no more than 1 month"  Substance and Sexual Activity  . Alcohol use: No    Alcohol/week: 0.0 oz    Comment: "no alcohol since I was a teenager"  . Drug use: No  . Sexual activity: Never  Lifestyle  . Physical activity:    Days per week: Not on file    Minutes per session: Not on file  . Stress: Not on file  Relationships  . Social connections:    Talks on phone: Not on file    Gets together: Not on file    Attends religious service: Not on file    Active member of club or organization: Not on file    Attends meetings of clubs or organizations: Not on file    Relationship status: Not on file  . Intimate partner violence:    Fear of current or ex partner: Not on file    Emotionally abused: Not on file    Physically abused: Not on file    Forced sexual activity: Not on file  Other Topics Concern  . Not on file  Social History Narrative   Patient is illiterate. He cannot read or write. He left school at about the seventh grade.   As of 10/2015 he reports that his wife is chronically ill at home with heart disease and COPD and is under hospice care. The bulk of the care is given by the patient and daughter.     REVIEW OF SYSTEMS:   [X]  denotes positive finding, [ ]  denotes negative finding Cardiac  Comments:  Chest pain or chest pressure: x   Shortness of breath upon exertion: x   Short of breath  when lying flat: x   Irregular heart rhythm:        Vascular    Pain in calf, thigh, or hip brought on by ambulation: x   Pain in feet at night that wakes you up from your sleep:     Blood clot in your veins:    Leg swelling:         Pulmonary    Oxygen at home:    Productive cough:     Wheezing:         Neurologic    Sudden weakness in arms or legs:   Denied when asked  Sudden numbness in arms or legs:   Denied when asked  Sudden onset of difficulty speaking or slurred speech:    Temporary loss of vision in one eye:     Problems with dizziness:  x       Gastrointestinal    Blood in stool:     Vomited blood:         Genitourinary    Burning when urinating:     Blood in urine:        Psychiatric    Major depression:  Hematologic    Bleeding problems:    Problems with blood clotting too easily:        Skin    Rashes or ulcers:        Constitutional    Fever or chills:      PHYSICAL EXAMINATION:  Vitals:   05/25/17 1441 05/25/17 1443  BP: 137/76 (!) 149/74  Pulse: 61   Resp: 20   SpO2: 96%    Vitals:   05/25/17 1441  Weight: 139 lb (63 kg)  Height: 5\' 4"  (1.626 m)   Body mass index is 23.86 kg/m.  General:  WDWN in NAD; vital signs documented above Gait: Not observed HENT: WNL, normocephalic Pulmonary: normal non-labored breathing , without Rales, rhonchi,  wheezing Cardiac: regular HR, without  Murmurs without carotid bruits Skin: without rashes Vascular Exam/Pulses:  Right Left  Radial 2+ (normal) 2+ (normal)  Ulnar Unable to palpate  Unable to palpate   Popliteal Unable to palpate  2+ (normal)  DP Unable to palpate  Unable to palpate   PT 2+ (normal) Unable to palpate    Extremities: without ischemic changes, without Gangrene , without cellulitis; without open wounds;  Musculoskeletal: no muscle wasting or atrophy  Neurologic: A&O X 3;  No focal weakness or paresthesias are detected Psychiatric:  The pt has Normal  affect.   Non-Invasive Vascular Imaging:   Carotid duplex 05/25/17: Right:  40-59% stenosis Left:  Patent CEA site with no evidence of restenosis Vertebral arteries are antegrade  Pt meds includes: Statin:  No. Beta Blocker:  Yes.   Aspirin:  Yes.   ACEI:  No. ARB:  No. CCB use:  Yes Other Antiplatelet/Anticoagulant:  No   ASSESSMENT/PLAN:: 77 y.o. male with hx of left carotid endarterectomy here for his yearly follow up   -the pt remains asymptomatic.  He has had increase in velocities on the right.  We will have him return in 6 months instead of a year for his duplex. -he does have some dizziness-his vertebral arteries are patent with antegrade flow. -he does complain of some pain in his legs with walking-he does have a palpable left popliteal pulse and palpable right PT pulse-when he returns in 6 months, we will obtain ABI's as well.  -discussed with him the sx of stroke and he knows to get to the ER if he develops any of these symptoms.    Leontine Locket, PA-C [facility] [phone]  Clinic MD:  Pt seen and examined with Dr. Donzetta Matters   I have interviewed and examined patient with PA and agree with assessment and plan above.  Velocities in the right have slightly increased but he does remain asymptomatic and is still in the 40s and 59% window.  He does have bilateral lower extremity pain but palpable pulses do not support a vascular nature for this but we will get ABIs at next visit and have him follow-up in 6 months with repeat carotid duplex as well.  Zilphia Kozinski C. Donzetta Matters, MD [facility] [place] Office: [phone] Pager: [phone]

## 2017-06-25 NOTE — Progress Notes (Signed)
[name] [name] was seen today in the movement disorders clinic for neurologic consultation at the request of Cyndi Bender, Hershal Coria.  The consultation is for the evaluation of tremor.  Records are reviewed.  Patient was seen in the emergency room and neurology was consulted on April 21, 2017.  The emergency room physician was concerned for Parkinson's disease.  The neurologist felt that this could potentially be an atypical parkinsonian state.  It was recommended that he discontinue his Ativan and follow-up here.   Specific Symptoms:  Tremor: Yes.  , had tremor for a "while" but it was one time every 4-5 months but now it seems like it is daily.  Feels like shaking all over, and feels tremor on the inside.  Nothing increases the tremor.  Has trouble with shaving when shakes.  Drinks 3-4 cups coffee/day but doesn't think that it affects tremor.  Doesn't drink EtOH Family hx of similar:  No. Voice: ? More hoarse than in the past Sleep: may or may not sleep well  Vivid Dreams:  No., wife died a year ago and has seen her in the dreams  Acting out dreams:  No. Wet Pillows: No. Postural symptoms:  Yes.    Falls?  No. Bradykinesia symptoms: slow movements and difficulty getting out of a chair Loss of smell:  No. Loss of taste:  Yes.   - "food doesn't taste like it used to." Urinary Incontinence:  No., does have urinary urgency ever since tx for prostate CA Difficulty Swallowing:  sometimes Handwriting, micrographia: cannot read or write so unknown Trouble with ADL's:  No.  Trouble buttoning clothing: No. Depression:  Yes.   - some since death of wife a year ago Memory changes:  Yes.   - able to remember own medications; only drives a little around home but not around [place] N/V:  Yes.  , for last 2 days had some nausea. Lightheaded:  Yes.   - "I'm a little dizzy all the time"  Syncope: No. Diplopia:  "I think so"  Neuroimaging of the brain has previously been performed.  He believes it was done  at [facility] years ago for possible mini stroke.   It is not available for my review today.  06/25/17 update:  Pt presents today to f/u with B12.   Found to be B12 deficient and started on B12 supplements.  Wanted to follow-up to find out if adequately replaced.  The records that were made available to me were reviewed.  Saw cardiology NP after our last visit and discussed dizziness.  I had previously thought that metoprolol contributing.  He was referred to ENT.  ENT did not think vertigo and recommended "reevaluation by his neurologist."  He is still having some dizziness but "I can walk better and it is better than it was."  States that he feels markedly better than when here last time.  He is still chewing tobacco  - "I wish I could quit."  PREVIOUS MEDICATIONS: none to date  ALLERGIES:   Allergies  Allergen Reactions  . Shellfish Allergy Anaphylaxis  . Zolpidem Tartrate Other (See Comments)    hallucinations  . Amoxicillin-Pot Clavulanate Other (See Comments)    REACTION: 'Burns' Stomach  . Codeine Nausea And Vomiting  . Erythromycin Diarrhea, Nausea And Vomiting and Other (See Comments)    All mycins cause upset stomach  . Hydrocodone Nausea And Vomiting  . Morphine Nausea And Vomiting  . Nitrofuran Derivatives Other (See Comments)    unknown  .  Oxycodone Hcl Nausea And Vomiting  . Tramadol Nausea And Vomiting    CURRENT MEDICATIONS:  Outpatient Encounter Medications as of 06/26/2017  Medication Sig  . amLODipine (NORVASC) 10 MG tablet Take 10 mg by mouth daily.  Marland Kitchen aspirin EC 81 MG tablet Take 81 mg by mouth at bedtime.   . diphenhydrAMINE (BENADRYL) 25 MG tablet Take 25 mg by mouth daily as needed.  . isosorbide dinitrate (ISORDIL) 20 MG tablet Take 20 mg by mouth 2 (two) times daily.  Marland Kitchen LORazepam (ATIVAN) 0.5 MG tablet Take 0.5 mg by mouth daily as needed for anxiety.  . metoCLOPramide (REGLAN) 10 MG tablet Take 10 mg by mouth daily as needed for nausea.  . metoprolol tartrate  (LOPRESSOR) 25 MG tablet Take 25 mg by mouth 2 (two) times daily.  . pantoprazole (PROTONIX) 40 MG tablet Take 40 mg by mouth 2 (two) times daily.   Marland Kitchen PARoxetine (PAXIL) 20 MG tablet Take 20 mg by mouth daily.  . Simethicone 125 MG CAPS Take 1 capsule (125 mg total) by mouth daily as needed.  . nitroGLYCERIN (NITROSTAT) 0.4 MG SL tablet Place 1 tablet (0.4 mg total) under the tongue every 5 (five) minutes as needed. For chest pain (Patient not taking: Reported on 06/26/2017)  . [DISCONTINUED] famotidine (PEPCID) 20 MG tablet Take 1 tablet (20 mg total) by mouth at bedtime.  . [DISCONTINUED] gabapentin (NEURONTIN) 300 MG capsule Take 300 mg by mouth daily.  . [DISCONTINUED] isosorbide dinitrate (ISORDIL) 30 MG tablet Take 1 tablet (30 mg total) by mouth 2 (two) times daily.  . [DISCONTINUED] meclizine (ANTIVERT) 12.5 MG tablet Take 1 tablet (12.5 mg total) by mouth 3 (three) times daily as needed for dizziness.  . [DISCONTINUED] metoprolol tartrate (LOPRESSOR) 50 MG tablet Take 50 mg by mouth 2 (two) times daily.  . [DISCONTINUED] ondansetron (ZOFRAN ODT) 4 MG disintegrating tablet Take 1 tablet (4 mg total) by mouth every 8 (eight) hours as needed for nausea or vomiting.   No facility-administered encounter medications on file as of 06/26/2017.     PAST MEDICAL HISTORY:   Past Medical History:  Diagnosis Date  . Anemia 10/06/2011  . Anginal pain ([place])   . Arthritis    "joints hurt; shoulders, arms, back" (08/13/2013)  . Atrial fibrillation ([place])   . B12 deficiency   . Carotid stenosis 04/10/11   a. s/p left carotid endarterectomy 04/14/2011.;  b.  Carotid US (11/13):  L CEA ok; RICA 1-39%  . Chronic bronchitis ([place])    "get it q yr"  . Chronic chest pain   . Chronic lower back pain   . Colon polyp    adenomatous  . Coronary artery disease    a. S/p CABG in 2001. b. S/p DES to protected LM and BMS to RCA 2006. c. 12/2009: s/p DES to Cx;  d. 09/2011 Cath: patent stents, patent grafts -->Med  Rx.;  e. CP with abnl Nuc => LHC 10/04/12: oLM 30%, dLM stent into the CFX patent, LAD occluded, LIMA-LAD patent, RI 30%, mid AVCFX 30%, oOM1 50%, oRCA stent patent, mRCA 50-60%, Radial graft-Dx patent; EF 55%.=> Med Rx  . Daily headache    "for the last couple weeks" (08/13/2013)  . Diverticulosis   . Esophageal stricture   . GERD (gastroesophageal reflux disease)   . Hemorrhoids   . Hiatal hernia   . Hyperlipidemia   . Hypertension   . Left bundle branch block   . Pancreatitis Dec 2011   ERCP ok.  Marland Kitchen  Prostate cancer [facility])    s/p cryoablation  . Renal cyst    Seen on CT 08/2011 also with circumferential bladder wall thickening  . Skin cancer    "cut it off my right ear"  . Statin intolerance     PAST SURGICAL HISTORY:   Past Surgical History:  Procedure Laterality Date  . CARDIAC CATHETERIZATION    . CHOLECYSTECTOMY    . COLONOSCOPY    . CORONARY ANGIOPLASTY WITH STENT PLACEMENT     "1 + 1"  . CORONARY ARTERY BYPASS GRAFT  2001  . ENDARTERECTOMY  04/14/2011   Procedure: ENDARTERECTOMY CAROTID;  Surgeon: Mal Misty, MD;  Location: [facility];  Service: Vascular;  Laterality: Left;  Would like to perform procedure first, at 0730  . EYE SURGERY     cataract  . INGUINAL HERNIA REPAIR Bilateral   . LEFT HEART CATHETERIZATION WITH CORONARY/GRAFT ANGIOGRAM N/A 10/05/2011   Procedure: LEFT HEART CATHETERIZATION WITH Beatrix Fetters;  Surgeon: Hillary Bow, MD;  Location: [facility] CATH LAB;  Service: Cardiovascular;  Laterality: N/A;  . LEFT HEART CATHETERIZATION WITH CORONARY/GRAFT ANGIOGRAM N/A 10/04/2012   Procedure: LEFT HEART CATHETERIZATION WITH Beatrix Fetters;  Surgeon: Jolaine Artist, MD;  Location: [facility] CATH LAB;  Service: Cardiovascular;  Laterality: N/A;  . POLYPECTOMY    . PR VEIN BYPASS GRAFT,AORTO-FEM-POP    . PROSTATE CRYOABLATION      SOCIAL HISTORY:   Social History   Socioeconomic History  . Marital status: Widowed    Spouse name: Not on file  .  Number of children: 2  . Years of education: Not on file  . Highest education level: Not on file  Occupational History  . Occupation: Retired    Comment: raised Public librarian  . Financial resource strain: Not on file  . Food insecurity:    Worry: Not on file    Inability: Not on file  . Transportation needs:    Medical: Not on file    Non-medical: Not on file  Tobacco Use  . Smoking status: Former Smoker    Years: 0.10    Types: Cigarettes  . Smokeless tobacco: Current User    Types: Chew  . Tobacco comment: "smoked a few cigarettes; no more than 1 month"  Substance and Sexual Activity  . Alcohol use: No    Alcohol/week: 0.0 oz    Comment: "no alcohol since I was a teenager"  . Drug use: No  . Sexual activity: Never  Lifestyle  . Physical activity:    Days per week: Not on file    Minutes per session: Not on file  . Stress: Not on file  Relationships  . Social connections:    Talks on phone: Not on file    Gets together: Not on file    Attends religious service: Not on file    Active member of club or organization: Not on file    Attends meetings of clubs or organizations: Not on file    Relationship status: Not on file  . Intimate partner violence:    Fear of current or ex partner: Not on file    Emotionally abused: Not on file    Physically abused: Not on file    Forced sexual activity: Not on file  Other Topics Concern  . Not on file  Social History Narrative   Patient is illiterate. He cannot read or write. He left school at about the seventh grade.   As of 10/2015 he  reports that his wife is chronically ill at home with heart disease and COPD and is under hospice care. The bulk of the care is given by the patient and daughter.    FAMILY HISTORY:   Family Status  Relation Name Status  . Mother  Deceased  . Father  Deceased  . MGM  Deceased  . MGF  Deceased  . PGM  Deceased  . PGF  Deceased  . Sister 2 Deceased  . Brother 2 Deceased  .  Daughter 2 Alive  . Neg Hx  (Not Specified)    ROS:  Review of Systems  Constitutional: Negative.   HENT: Negative.   Eyes: Negative.   Respiratory: Negative.   Cardiovascular: Negative.   Gastrointestinal: Negative.   Genitourinary: Negative.   Skin: Negative.     PHYSICAL EXAMINATION:    VITALS:   Vitals:   06/26/17 1426  SpO2: 96%  Weight: 139 lb (63 kg)  Height: 5\' 4"  (1.626 m)   Orthostatic VS for the past 24 hrs (Last 3 readings):  BP- Lying Pulse- Lying BP- Sitting Pulse- Sitting BP- Standing at 0 minutes Pulse- Standing at 0 minutes  06/26/17 1431 134/76 62 140/82 62 118/64 64    GEN:  The patient appears stated age and is in NAD. HEENT:  Normocephalic, atraumatic.  The mucous membranes are moist. The superficial temporal arteries are without ropiness or tenderness. CV:  RRR Lungs:  CTAB Neck/HEME:  There are no carotid bruits bilaterally.  Neurological examination:  Orientation: The patient is alert and oriented x3. Cranial nerves: There is good facial symmetry. The speech is fluent and clear. Soft palate rises symmetrically and there is no tongue deviation. Hearing is intact to conversational tone. Sensation: Sensation is intact to light touch throughout Motor: Strength is 5/5 in the bilateral upper and lower extremities.   Shoulder shrug is equal and symmetric.  There is no pronator drift.  Movement examination: Tone: There is normal tone in the upper and lower extremities. Abnormal movements: No rest tremor.  No postural tremor.  No intention tremor. Coordination:  There is no decremation with RAM's, with any form of RAMS, including alternating supination and pronation of the forearm, hand opening and closing, finger taps, heel taps and toe taps. Gait and Station: The patient has no difficulty arising out of a deep-seated chair without the use of the hands. The patient's stride length is normal with good arm swing.  He is steady.   Lab Results  Component  Value Date   TSH 1.68 05/15/2017     Chemistry      Component Value Date/Time   NA 141 04/20/2017 1317   K 3.9 04/20/2017 1317   CL 106 04/20/2017 1317   CO2 26 04/20/2017 1317   BUN 15 04/20/2017 1317   CREATININE 1.28 (H) 04/20/2017 1317   CREATININE 1.26 04/07/2011 1549      Component Value Date/Time   CALCIUM 8.9 04/20/2017 1317   ALKPHOS 59 08/08/2016 2242   AST 18 08/08/2016 2242   ALT 14 (L) 08/08/2016 2242   BILITOT 1.5 (H) 08/08/2016 2242     Lab Results  Component Value Date   HGBA1C 5.4 05/15/2017   Lab Results  Component Value Date   VITAMINB12 261 05/15/2017     ASSESSMENT/PLAN:  1.  Tremor.  -No tremor noted today and the patient does not complain about this.   2.  b12 deficiency  -Patient is on supplements.  Will recheck to make sure her  B12 level is increasing appropriately.  3.  Chew tobacco  -Talked again about importance of discontinuing tobacco for her overall health and wellness.  4.  Lightheadedness  -still think that metoprolol playing a role.  Neuro exam non focal and nonlateralizing.  Saw cardiology NP but no med changes and referred to ENT, who felt pt should return here.  Pts BP dropped from 140 to 118 from sitting to standing in the office.  Clinically, pt less dizzy than previous.  See no neuro etiology for this  5.  F/u prn  Cc:  Cyndi Bender, PA-C

## 2017-06-26 ENCOUNTER — Encounter: Payer: Self-pay | Admitting: Neurology

## 2017-06-26 ENCOUNTER — Ambulatory Visit (INDEPENDENT_AMBULATORY_CARE_PROVIDER_SITE_OTHER): Payer: Medicare Other | Admitting: Neurology

## 2017-06-26 VITALS — Ht 64.0 in | Wt 139.0 lb

## 2017-06-26 DIAGNOSIS — I6523 Occlusion and stenosis of bilateral carotid arteries: Secondary | ICD-10-CM | POA: Diagnosis not present

## 2017-06-26 DIAGNOSIS — R42 Dizziness and giddiness: Secondary | ICD-10-CM | POA: Diagnosis not present

## 2017-06-26 DIAGNOSIS — E538 Deficiency of other specified B group vitamins: Secondary | ICD-10-CM

## 2017-06-27 ENCOUNTER — Telehealth: Payer: Self-pay | Admitting: Neurology

## 2017-06-27 LAB — VITAMIN B12: Vitamin B-12: 714 pg/mL (ref 200–1100)

## 2017-06-27 NOTE — Telephone Encounter (Signed)
-----   Message from [place], DO sent at 06/27/2017  8:00 AM EDT ----- Let pt know that b12 looks great.

## 2017-06-27 NOTE — Telephone Encounter (Signed)
LMOM making patient aware of B12 results (per DPR okay to leave detailed message).

## 2017-07-04 ENCOUNTER — Telehealth: Payer: Self-pay | Admitting: Neurology

## 2017-07-04 NOTE — Telephone Encounter (Signed)
I copied cardiology NP on last note about it but didn't get response.  Have pt call cardiology for direction.

## 2017-07-04 NOTE — Telephone Encounter (Signed)
Patient's daughter called [name]) Regarding a referral or not having heard from anyone about Jaylee's Blood Pressure. He was told at his last visit he would be hearing from someone? Please Call. Thanks

## 2017-07-06 ENCOUNTER — Other Ambulatory Visit: Payer: Self-pay

## 2017-07-06 DIAGNOSIS — I6523 Occlusion and stenosis of bilateral carotid arteries: Secondary | ICD-10-CM

## 2017-07-10 NOTE — Telephone Encounter (Signed)
Called and spoke with daughter and let her know to contact Cardiology about blood pressures. She expressed understanding and will reach out to their office.

## 2017-07-10 NOTE — Telephone Encounter (Signed)
Patient daughter [name] called and still has not heard anything from anuone about patient blood pressure please call she would like to know the status of this [phone]

## 2017-08-04 NOTE — Progress Notes (Signed)
Cardiology Office Note   Date:  08/06/2017   ID:  [name] [name], DOB 09-16-1940, MRN [id number]  PCP:  Cyndi Bender, PA-C  Cardiologist:  [facility] Chief Complaint  Patient presents with  . office visit    seen Neurologist x1 month ago for lightheadness, slight swelling noted, slight SOB noted     History of Present Illness: [name] [name] is a 77 y.o. male who presents for ongoing assessment and management of chronic chest pain, patient has a history of CABG in 2001, hypertension, atrial fibrillation, carotid artery stenosis, chronic headaches and hypercholesterolemia.  On last office visit on 05/16/2017 the patient continued to have recurrent chest pain but describes it differently from his prior cardiac pain.  It appears that the patient was having GI symptoms with a lot of burping and gas.  He was started on some simethicone which she was to take up to 3 times a day and he was to follow-up with primary care, and GI.  He has chronic dizziness and I referred him to ENT for further evaluation for possible causes of his chronic dizziness.  He was to follow-up with [facility] as well for ongoing carotid artery stenosis and evaluation.  He was rescheduled for carotid artery Doppler studies.  He was found to have increased velocities on the right, and was to return to follow-up with Dr. Donzetta Matters in 6 months for repeat Doppler studies.  ABIs will be obtained at that time as well.  He states he still has dizziness with position change. The isosorbide was not helpful for chest pain despite higher doses.   Past Medical History:  Diagnosis Date  . Anemia 10/06/2011  . Anginal pain ([place])   . Arthritis    "joints hurt; shoulders, arms, back" (08/13/2013)  . Atrial fibrillation ([place])   . B12 deficiency   . Carotid stenosis 04/10/11   a. s/p left carotid endarterectomy 04/14/2011.;  b.  Carotid US (11/13):  L CEA ok; RICA 1-39%  . Chronic bronchitis ([place])    "get it q yr"  . Chronic chest pain   . Chronic  lower back pain   . Colon polyp    adenomatous  . Coronary artery disease    a. S/p CABG in 2001. b. S/p DES to protected LM and BMS to RCA 2006. c. 12/2009: s/p DES to Cx;  d. 09/2011 Cath: patent stents, patent grafts -->Med Rx.;  e. CP with abnl Nuc => LHC 10/04/12: oLM 30%, dLM stent into the CFX patent, LAD occluded, LIMA-LAD patent, RI 30%, mid AVCFX 30%, oOM1 50%, oRCA stent patent, mRCA 50-60%, Radial graft-Dx patent; EF 55%.=> Med Rx  . Daily headache    "for the last couple weeks" (08/13/2013)  . Diverticulosis   . Esophageal stricture   . GERD (gastroesophageal reflux disease)   . Hemorrhoids   . Hiatal hernia   . Hyperlipidemia   . Hypertension   . Left bundle branch block   . Pancreatitis Dec 2011   ERCP ok.  . Prostate cancer [facility])    s/p cryoablation  . Renal cyst    Seen on CT 08/2011 also with circumferential bladder wall thickening  . Skin cancer    "cut it off my right ear"  . Statin intolerance     Past Surgical History:  Procedure Laterality Date  . CARDIAC CATHETERIZATION    . CHOLECYSTECTOMY    . COLONOSCOPY    . CORONARY ANGIOPLASTY WITH STENT PLACEMENT     "1 + 1"  .  CORONARY ARTERY BYPASS GRAFT  2001  . ENDARTERECTOMY  04/14/2011   Procedure: ENDARTERECTOMY CAROTID;  Surgeon: Mal Misty, MD;  Location: [place];  Service: Vascular;  Laterality: Left;  Would like to perform procedure first, at 0730  . EYE SURGERY     cataract  . INGUINAL HERNIA REPAIR Bilateral   . LEFT HEART CATHETERIZATION WITH CORONARY/GRAFT ANGIOGRAM N/A 10/05/2011   Procedure: LEFT HEART CATHETERIZATION WITH Beatrix Fetters;  Surgeon: Hillary Bow, MD;  Location: [facility] CATH LAB;  Service: Cardiovascular;  Laterality: N/A;  . LEFT HEART CATHETERIZATION WITH CORONARY/GRAFT ANGIOGRAM N/A 10/04/2012   Procedure: LEFT HEART CATHETERIZATION WITH Beatrix Fetters;  Surgeon: Jolaine Artist, MD;  Location: [facility] CATH LAB;  Service: Cardiovascular;  Laterality: N/A;  .  POLYPECTOMY    . PR VEIN BYPASS GRAFT,AORTO-FEM-POP    . PROSTATE CRYOABLATION       Current Outpatient Medications  Medication Sig Dispense Refill  . amLODipine (NORVASC) 10 MG tablet Take 10 mg by mouth daily.    Marland Kitchen aspirin EC 81 MG tablet Take 81 mg by mouth at bedtime.     . cyanocobalamin 1000 MCG tablet Take 1,000 mcg by mouth daily.    . diphenhydrAMINE (BENADRYL) 25 MG tablet Take 25 mg by mouth daily as needed.    Marland Kitchen LORazepam (ATIVAN) 0.5 MG tablet Take 0.5 mg by mouth daily as needed for anxiety.  2  . metoCLOPramide (REGLAN) 10 MG tablet Take 10 mg by mouth daily as needed for nausea.    . metoprolol tartrate (LOPRESSOR) 25 MG tablet Take 25 mg by mouth 2 (two) times daily.    . nitroGLYCERIN (NITROSTAT) 0.4 MG SL tablet Place 1 tablet (0.4 mg total) under the tongue every 5 (five) minutes as needed. For chest pain 25 tablet 3  . pantoprazole (PROTONIX) 40 MG tablet Take 40 mg by mouth 2 (two) times daily.     Marland Kitchen PARoxetine (PAXIL) 20 MG tablet Take 20 mg by mouth daily.  5  . Simethicone 125 MG CAPS Take 1 capsule (125 mg total) by mouth daily as needed. 30 each 11  . isosorbide mononitrate (IMDUR) 30 MG 24 hr tablet Take 0.5 tablets (15 mg total) by mouth daily. 15 tablet 3   No current facility-administered medications for this visit.     Allergies:   Shellfish allergy; Zolpidem tartrate; Amoxicillin-pot clavulanate; Codeine; Erythromycin; Hydrocodone; Morphine; Nitrofuran derivatives; Oxycodone hcl; and Tramadol    Social History:  The patient  reports that he has quit smoking. His smoking use included cigarettes. He quit after 0.10 years of use. His smokeless tobacco use includes chew. He reports that he does not drink alcohol or use drugs.   Family History:  The patient's family history includes Cancer in his sister; Heart attack in his brother, mother, and sister; Heart disease in his brother, mother, and sister; Hypertension in his brother, mother, and sister; Lung  cancer in his father.    ROS: All other systems are reviewed and negative. Unless otherwise mentioned in H&P    PHYSICAL EXAM: VS:  BP 132/70 (BP Location: Left Arm, Patient Position: Sitting)   Pulse 61   Ht 5\' 4"  (1.626 m)   Wt 143 lb (64.9 kg)   SpO2 98%   BMI 24.55 kg/m  , BMI Body mass index is 24.55 kg/m. GEN: Well nourished, well developed, in no acute distress  HEENT: normal  Neck: no JVD, carotid bruits, or masses Cardiac: RRR; no murmurs, rubs, or gallops,no  edema  Respiratory:  Clear to auscultation bilaterally, normal work of breathing GI: soft, nontender, nondistended, + BS MS: no deformity or atrophy  Skin: warm and dry, no rash Neuro:  Strength and sensation are intact Psych: euthymic mood, full affect   EKG:  Not competed this office visit.   Recent Labs: 08/08/2016: ALT 14 04/20/2017: BUN 15; Creatinine, Ser 1.28; Hemoglobin 14.3; Platelets 213; Potassium 3.9; Sodium 141 04/21/2017: B Natriuretic Peptide 116.2 05/15/2017: TSH 1.68    Lipid Panel    Component Value Date/Time   CHOL 223 (H) 10/03/2012 0543   TRIG 217 (H) 10/03/2012 0543   HDL 30 (L) 10/03/2012 0543   CHOLHDL 7.4 10/03/2012 0543   VLDL 43 (H) 10/03/2012 0543   LDLCALC 150 (H) 10/03/2012 0543   LDLDIRECT 178.8 04/02/2007 0906      Wt Readings from Last 3 Encounters:  08/06/17 143 lb (64.9 kg)  06/26/17 139 lb (63 kg)  05/25/17 139 lb (63 kg)      Other studies Reviewed: Study Highlights 08/18/2014   Nuclear stress EF: 50%.  The left ventricular ejection fraction is mildly decreased (45-54%).  This is a low risk study.   Low risk stress nuclear study with a medium size, moderate intensity, fixed septal defect suggestive of prior infarct vs LBBB artifact; no ischemia; EF 50 with mild global hypokinesis.     ASSESSMENT AND PLAN:  1.  Dizziness: I will reduce the isosorbide dinitrate to isosorbide mononitrate 15 mg daily to avoid positional dizziness as the nitrates are not  helpful to chest discomfort. He will keep track of his symptoms   2. CAD: Hx of  CABG in 2001 with repeat stress test in 2016 as low risk.   3. Hypertension: BP is well controlled currently. He is to keep track of BP at home and record. He will come back in 2 weeks for BP check.   4. Carotid Artery disease: He is followed by Dr. Bridgett Larsson and is due to be re-studied in November along with ABI's    Current medicines are reviewed at length with the patient today.    Labs/ tests ordered today include: None   Phill Myron. West Pugh, ANP, AACC   08/06/2017 4:42 PM    [facility] [address] [place], [place] [address] Phone: [phone]; Fax: [phone]

## 2017-08-06 ENCOUNTER — Telehealth: Payer: Self-pay | Admitting: Cardiology

## 2017-08-06 ENCOUNTER — Ambulatory Visit (INDEPENDENT_AMBULATORY_CARE_PROVIDER_SITE_OTHER): Payer: Medicare Other | Admitting: Adult Health

## 2017-08-06 ENCOUNTER — Encounter: Payer: Self-pay | Admitting: Adult Health

## 2017-08-06 VITALS — BP 132/70 | HR 61 | Ht 64.0 in | Wt 143.0 lb

## 2017-08-06 DIAGNOSIS — R0789 Other chest pain: Secondary | ICD-10-CM | POA: Diagnosis not present

## 2017-08-06 DIAGNOSIS — I2510 Atherosclerotic heart disease of native coronary artery without angina pectoris: Secondary | ICD-10-CM | POA: Diagnosis not present

## 2017-08-06 DIAGNOSIS — I6523 Occlusion and stenosis of bilateral carotid arteries: Secondary | ICD-10-CM | POA: Diagnosis not present

## 2017-08-06 DIAGNOSIS — I10 Essential (primary) hypertension: Secondary | ICD-10-CM | POA: Diagnosis not present

## 2017-08-06 MED ORDER — ISOSORBIDE MONONITRATE ER 30 MG PO TB24: 15.0000 mg | ORAL_TABLET | Freq: Every day | ORAL | 3 refills | Status: DC

## 2017-08-06 MED ORDER — ISOSORBIDE DINITRATE 30 MG PO TABS: 30.0000 mg | ORAL_TABLET | Freq: Four times a day (QID) | ORAL | 3 refills | Status: DC

## 2017-08-06 NOTE — Telephone Encounter (Signed)
Received incoming call regarding patient's new decreased dose of isosorbide mononitrate.  Pharmacy had questions regarding the dosage and frequency of the medication.  Patient was seen in the office today.  I have clarified the new frequency and dose. He should take isosorbide mononitrate 15 mg PO daily per chart review.  Reviewed with pharmacist.   Kathyrn Drown NP-C [place] Pager: [phone]

## 2017-08-06 NOTE — Patient Instructions (Signed)
Medication Instructions:  STOP ISOSORBIDE DINITRATE-HOLD TODAY'S EVENING DOSE  START ISOSORBIDE MONONITRATE 15MG  DAILY (1/2 TABLET)-START TOMORROW  If you need a refill on your cardiac medications before your next appointment, please call your pharmacy.  Special Instructions: TAKE AND LOG BP & HR DAILY  BRING BP MONITOR WITH YOU TO YOUR FOLLOW UP APPTOINTMENT  Follow-Up: Your physician wants you to follow-up in: 2 [place] ([place]), DNP,AACC IF PRIMARY CARDIOLOGIST IS UNAVAILABLE.    Thank you for choosing [facility] at [facility]!!

## 2017-08-13 ENCOUNTER — Telehealth: Payer: Self-pay | Admitting: Cardiology

## 2017-08-13 MED ORDER — ISOSORBIDE MONONITRATE ER 30 MG PO TB24: 30.0000 mg | ORAL_TABLET | Freq: Every day | ORAL | 2 refills | Status: DC

## 2017-08-13 NOTE — Telephone Encounter (Signed)
Pt daughter [name] [name], called in as pt having issues with CP at night after taking the Imdur 15 mg (half tablet). Okay to talk to her per [facility]. Pt medication was adjusted  from Isordil 30mg  tablet BID to Imdur 15mg  daily at last OV on 7/15. Pt has been taking the 15mg  of Imdur every since 7/16 but his CP has increase each night. Reviewed with Arnold Long, orders given for pt to increase IMDUR to 30mg  (whole tablet) daily. Pt to be aware this may effect his dizziness again and to be very carefull when changing position but should help with the chest pain. Order updated to pt pharmacy, pt instructions given to daughter as I was unable to reach the pt, line was busy. No additional questions at this time.

## 2017-08-13 NOTE — Telephone Encounter (Signed)
New Message:        Pt c/o medication issue:  1. Name of Medication: isosorbide mononitrate (IMDUR) 30 MG 24 hr tablet  2. How are you currently taking this medication (dosage and times per day)? Take 0.5 tablets (15 mg total) by mouth daily.  3. Are you having a reaction (difficulty breathing--STAT)? No  4. What is your medication issue? Pt's daughter is calling and states the pt has started to have CP/SOB since he has started this medication. She states is started last week but it is coming and going.

## 2017-08-14 NOTE — Telephone Encounter (Signed)
Thank you for doing this!  Curt Bears

## 2017-08-16 NOTE — Progress Notes (Signed)
Cardiology Office Note   Date:  08/16/2017   ID:  [name] [name], DOB 09/26/1940, MRN [id number]  PCP:  Cyndi Bender, PA-C  Cardiologist:  Lubertha South  No chief complaint on file.    History of Present Illness: [name] [name] is a 77 y.o. male who presents for ongoing assessment and management of chronic chest pain, known hx of  CAD, CABG in 2001, HTN, atrial fib, and carotid artery disease, hypercholesterolemia, and chronic headaches. He is followed by [facility] for carotid disease. He has chronic dizziness.   On last visit on 08/06/2017 I reduced his isosorbide due to the dizziness. He did not tolerate this well as is chest pain returned and dizziness did not improve.    Past Medical History:  Diagnosis Date  . Anemia 10/06/2011  . Anginal pain ([place])   . Arthritis    "joints hurt; shoulders, arms, back" (08/13/2013)  . Atrial fibrillation ([place])   . B12 deficiency   . Carotid stenosis 04/10/11   a. s/p left carotid endarterectomy 04/14/2011.;  b.  Carotid US (11/13):  L CEA ok; RICA 1-39%  . Chronic bronchitis ([place])    "get it q yr"  . Chronic chest pain   . Chronic lower back pain   . Colon polyp    adenomatous  . Coronary artery disease    a. S/p CABG in 2001. b. S/p DES to protected LM and BMS to RCA 2006. c. 12/2009: s/p DES to Cx;  d. 09/2011 Cath: patent stents, patent grafts -->Med Rx.;  e. CP with abnl Nuc => LHC 10/04/12: oLM 30%, dLM stent into the CFX patent, LAD occluded, LIMA-LAD patent, RI 30%, mid AVCFX 30%, oOM1 50%, oRCA stent patent, mRCA 50-60%, Radial graft-Dx patent; EF 55%.=> Med Rx  . Daily headache    "for the last couple weeks" (08/13/2013)  . Diverticulosis   . Esophageal stricture   . GERD (gastroesophageal reflux disease)   . Hemorrhoids   . Hiatal hernia   . Hyperlipidemia   . Hypertension   . Left bundle branch block   . Pancreatitis Dec 2011   ERCP ok.  . Prostate cancer [facility])    s/p cryoablation  . Renal cyst    Seen on CT 08/2011 also with  circumferential bladder wall thickening  . Skin cancer    "cut it off my right ear"  . Statin intolerance     Past Surgical History:  Procedure Laterality Date  . CARDIAC CATHETERIZATION    . CHOLECYSTECTOMY    . COLONOSCOPY    . CORONARY ANGIOPLASTY WITH STENT PLACEMENT     "1 + 1"  . CORONARY ARTERY BYPASS GRAFT  2001  . ENDARTERECTOMY  04/14/2011   Procedure: ENDARTERECTOMY CAROTID;  Surgeon: Mal Misty, MD;  Location: [facility];  Service: Vascular;  Laterality: Left;  Would like to perform procedure first, at 0730  . EYE SURGERY     cataract  . INGUINAL HERNIA REPAIR Bilateral   . LEFT HEART CATHETERIZATION WITH CORONARY/GRAFT ANGIOGRAM N/A 10/05/2011   Procedure: LEFT HEART CATHETERIZATION WITH Beatrix Fetters;  Surgeon: Hillary Bow, MD;  Location: [facility] CATH LAB;  Service: Cardiovascular;  Laterality: N/A;  . LEFT HEART CATHETERIZATION WITH CORONARY/GRAFT ANGIOGRAM N/A 10/04/2012   Procedure: LEFT HEART CATHETERIZATION WITH Beatrix Fetters;  Surgeon: Jolaine Artist, MD;  Location: [facility] CATH LAB;  Service: Cardiovascular;  Laterality: N/A;  . POLYPECTOMY    . PR VEIN BYPASS GRAFT,AORTO-FEM-POP    . PROSTATE CRYOABLATION  Current Outpatient Medications  Medication Sig Dispense Refill  . amLODipine (NORVASC) 10 MG tablet Take 10 mg by mouth daily.    Marland Kitchen aspirin EC 81 MG tablet Take 81 mg by mouth at bedtime.     . cyanocobalamin 1000 MCG tablet Take 1,000 mcg by mouth daily.    . diphenhydrAMINE (BENADRYL) 25 MG tablet Take 25 mg by mouth daily as needed.    . isosorbide mononitrate (IMDUR) 30 MG 24 hr tablet Take 1 tablet (30 mg total) by mouth daily. 30 tablet 2  . LORazepam (ATIVAN) 0.5 MG tablet Take 0.5 mg by mouth daily as needed for anxiety.  2  . metoCLOPramide (REGLAN) 10 MG tablet Take 10 mg by mouth daily as needed for nausea.    . metoprolol tartrate (LOPRESSOR) 25 MG tablet Take 25 mg by mouth 2 (two) times daily.    . nitroGLYCERIN  (NITROSTAT) 0.4 MG SL tablet Place 1 tablet (0.4 mg total) under the tongue every 5 (five) minutes as needed. For chest pain 25 tablet 3  . pantoprazole (PROTONIX) 40 MG tablet Take 40 mg by mouth 2 (two) times daily.     Marland Kitchen PARoxetine (PAXIL) 20 MG tablet Take 20 mg by mouth daily.  5  . Simethicone 125 MG CAPS Take 1 capsule (125 mg total) by mouth daily as needed. 30 each 11   No current facility-administered medications for this visit.     Allergies:   Shellfish allergy; Zolpidem tartrate; Amoxicillin-pot clavulanate; Codeine; Erythromycin; Hydrocodone; Morphine; Nitrofuran derivatives; Oxycodone hcl; and Tramadol    Social History:  The patient  reports that he has quit smoking. His smoking use included cigarettes. He quit after 0.10 years of use. His smokeless tobacco use includes chew. He reports that he does not drink alcohol or use drugs.   Family History:  The patient's family history includes Cancer in his sister; Heart attack in his brother, mother, and sister; Heart disease in his brother, mother, and sister; Hypertension in his brother, mother, and sister; Lung cancer in his father.    ROS: All other systems are reviewed and negative. Unless otherwise mentioned in H&P    PHYSICAL EXAM: VS:  There were no vitals taken for this visit. , BMI There is no height or weight on file to calculate BMI. GEN: Well nourished, well developed, in no acute distress  HEENT: normal  Neck: no JVD, carotid bruits, or masses Cardiac: RRR; no murmurs, rubs, or gallops,no edema  Respiratory:  clear to auscultation bilaterally, normal work of breathing Neuro:  Strength and sensation are intact Psych: euthymic mood, full affect   EKG:  Sinus bradycardia rate of 57 bpm. LBBB.   Recent Labs: 04/20/2017: BUN 15; Creatinine, Ser 1.28; Hemoglobin 14.3; Platelets 213; Potassium 3.9; Sodium 141 04/21/2017: B Natriuretic Peptide 116.2 05/15/2017: TSH 1.68    Lipid Panel    Component Value Date/Time     CHOL 223 (H) 10/03/2012 0543   TRIG 217 (H) 10/03/2012 0543   HDL 30 (L) 10/03/2012 0543   CHOLHDL 7.4 10/03/2012 0543   VLDL 43 (H) 10/03/2012 0543   LDLCALC 150 (H) 10/03/2012 0543   LDLDIRECT 178.8 04/02/2007 0906      Wt Readings from Last 3 Encounters:  08/06/17 143 lb (64.9 kg)  06/26/17 139 lb (63 kg)  05/25/17 139 lb (63 kg)      Other studies Reviewed: Study Highlights 08/18/2014   Nuclear stress EF: 50%.  The left ventricular ejection fraction is mildly decreased (45-54%).  This is a low risk study.   Low risk stress nuclear study with a medium size, moderate intensity, fixed septal defect suggestive of prior infarct vs LBBB artifact; no ischemia; EF 50 with mild global hypokinesis.     ASSESSMENT AND PLAN:  1.  Chronic Angina: He did not tolerate the reduction in the dose of nitrates, I will go back up on the isosorbide to 30 mg daily. We discussed this and he states he would rather be a little dizzy than have chest discomfort. He will be careful when standing from lying or sitting position.   2. Hypertension: BP slightly elevated in the afternoon and low in the am after taking his medications. We discussed staggering his medication times but he states that he would rather just take it all in the am and pm without adding a noontime time to his routine. I told him that some of his dizziness may be from taking all of his medications at the same time. He states that he would forget to take in at noon if we changed the times.  Will leave him at this routine for now.   Current medicines are reviewed at length with the patient today.    Labs/ tests ordered today include: None  Phill Myron. West Pugh, ANP, AACC   08/16/2017 10:29 AM    [facility] [address] [place], [place] [address] Phone: [phone]; Fax: [phone]

## 2017-08-20 ENCOUNTER — Ambulatory Visit (INDEPENDENT_AMBULATORY_CARE_PROVIDER_SITE_OTHER): Payer: Medicare Other | Admitting: Adult Health

## 2017-08-20 ENCOUNTER — Encounter: Payer: Self-pay | Admitting: Adult Health

## 2017-08-20 VITALS — BP 146/72 | HR 57 | Ht 64.0 in | Wt 140.0 lb

## 2017-08-20 DIAGNOSIS — G8929 Other chronic pain: Secondary | ICD-10-CM

## 2017-08-20 DIAGNOSIS — R079 Chest pain, unspecified: Secondary | ICD-10-CM | POA: Diagnosis not present

## 2017-08-20 DIAGNOSIS — I6523 Occlusion and stenosis of bilateral carotid arteries: Secondary | ICD-10-CM | POA: Diagnosis not present

## 2017-08-20 DIAGNOSIS — I10 Essential (primary) hypertension: Secondary | ICD-10-CM | POA: Diagnosis not present

## 2017-08-20 MED ORDER — ISOSORBIDE MONONITRATE ER 30 MG PO TB24: 30.0000 mg | ORAL_TABLET | Freq: Every day | ORAL | 2 refills | Status: DC

## 2017-08-20 NOTE — Patient Instructions (Signed)
Medication Instructions:  INCREASE BACK TO ISOSORBIDE 30MG  DAILY  If you need a refill on your cardiac medications before your next appointment, please call your pharmacy.  Follow-Up: Your physician wants you to follow-up in: [place] should receive a reminder letter in the mail two months in advance. If you do not receive a letter, please call our office NOV 2019 to schedule the JAN 2020 follow-up appointment.   Thank you for choosing [facility] at [facility]!!

## 2017-09-04 ENCOUNTER — Other Ambulatory Visit: Payer: Self-pay

## 2017-09-04 ENCOUNTER — Emergency Department (HOSPITAL_COMMUNITY): Payer: Medicare Other

## 2017-09-04 ENCOUNTER — Emergency Department (HOSPITAL_COMMUNITY)
Admission: EM | Admit: 2017-09-04 | Discharge: 2017-09-04 | Disposition: A | Discharge status: Home / Self Care | Payer: Medicare Other | Attending: Emergency Medicine | Admitting: Emergency Medicine

## 2017-09-04 ENCOUNTER — Encounter (HOSPITAL_COMMUNITY): Payer: Self-pay | Admitting: *Deleted

## 2017-09-04 DIAGNOSIS — Z951 Presence of aortocoronary bypass graft: Secondary | ICD-10-CM | POA: Diagnosis not present

## 2017-09-04 DIAGNOSIS — F17220 Nicotine dependence, chewing tobacco, uncomplicated: Secondary | ICD-10-CM | POA: Diagnosis not present

## 2017-09-04 DIAGNOSIS — R079 Chest pain, unspecified: Secondary | ICD-10-CM | POA: Diagnosis not present

## 2017-09-04 DIAGNOSIS — R0602 Shortness of breath: Secondary | ICD-10-CM | POA: Diagnosis not present

## 2017-09-04 DIAGNOSIS — I10 Essential (primary) hypertension: Secondary | ICD-10-CM | POA: Diagnosis not present

## 2017-09-04 DIAGNOSIS — Z7982 Long term (current) use of aspirin: Secondary | ICD-10-CM | POA: Diagnosis not present

## 2017-09-04 DIAGNOSIS — I2510 Atherosclerotic heart disease of native coronary artery without angina pectoris: Secondary | ICD-10-CM | POA: Diagnosis not present

## 2017-09-04 DIAGNOSIS — Z79899 Other long term (current) drug therapy: Secondary | ICD-10-CM | POA: Insufficient documentation

## 2017-09-04 LAB — BASIC METABOLIC PANEL
ANION GAP: 9 (ref 5–15)
BUN: 16 mg/dL (ref 8–23)
CO2: 25 mmol/L (ref 22–32)
Calcium: 8.7 mg/dL — ABNORMAL LOW (ref 8.9–10.3)
Chloride: 107 mmol/L (ref 98–111)
Creatinine, Ser: 1.32 mg/dL — ABNORMAL HIGH (ref 0.61–1.24)
GFR, EST AFRICAN AMERICAN: 58 mL/min — AB (ref >60)
GFR, EST NON AFRICAN AMERICAN: 50 mL/min — AB (ref >60)
Glucose, Bld: 113 mg/dL — ABNORMAL HIGH (ref 70–99)
POTASSIUM: 3.6 mmol/L (ref 3.5–5.1)
SODIUM: 141 mmol/L (ref 135–145)

## 2017-09-04 LAB — CBC
HCT: 42.4 % (ref 39.0–52.0)
Hemoglobin: 14.3 g/dL (ref 13.0–17.0)
MCH: 30.6 pg (ref 26.0–34.0)
MCHC: 33.7 g/dL (ref 30.0–36.0)
MCV: 90.6 fL (ref 78.0–100.0)
Platelets: 206 10*3/uL (ref 150–400)
RBC: 4.68 MIL/uL (ref 4.22–5.81)
RDW: 12.8 % (ref 11.5–15.5)
WBC: 6.4 10*3/uL (ref 4.0–10.5)

## 2017-09-04 LAB — I-STAT TROPONIN, ED
TROPONIN I, POC: 0.02 ng/mL (ref 0.00–0.08)
Troponin i, poc: 0.01 ng/mL (ref 0.00–0.08)

## 2017-09-04 LAB — BRAIN NATRIURETIC PEPTIDE: B NATRIURETIC PEPTIDE 5: 153.8 pg/mL — AB (ref 0.0–100.0)

## 2017-09-04 MED ORDER — ASPIRIN 81 MG PO CHEW
324.0000 mg | CHEWABLE_TABLET | Freq: Once | ORAL | Status: AC
  Administered 2017-09-04: 324 mg via ORAL
  Filled 2017-09-04: qty 4

## 2017-09-04 NOTE — ED Provider Notes (Signed)
Medical screening examination/treatment/procedure(s) were conducted as a shared visit with non-physician practitioner(s) and myself.  I personally evaluated the patient during the encounter.  Clinical Impression:   Final diagnoses:  Chest pain, unspecified type    The patient is an elderly 77 year old male, he denies any prior history of cardiac disease (but his medical record shows that he has had a bypass graft in 2001 as well as multiple stents in the past) most recent heart catheterization in 2013 showed patent stents and patent grafts.  And then in 2014 had a left heart cath showing left anterior descending occluded., he has been seen in the cardiology offices in the past secondary to blood pressure, atrial fibrillation for which she states he takes a baby aspirin, he was seen in the office on August 20, 2017 at which time it was noted that he was not tolerating a decreased dose of nitrates and thus his Imdur was left at 30 mg a day.  He states that over the last week he has had intermittent chest pain which is a burning sensation in the middle of his chest which sometimes involves his throat and sometimes his left shoulder, it will come on at rest, it lasts for minutes, sometimes 10 minutes, sometimes longer, sometimes shorter, nothing specifically makes it better, he states that when he eats or drinks it definitely makes it worse.  On exam he has clear heart and lung sounds, there is no murmurs, he is slightly bradycardic, normal pulses at the radial arteries, no edema, no JVD.  EKG shows sinus bradycardia with a bundle branch block, this is consistent with prior EKGs, troponin was negative, second troponin pending.  If second troponin is negative I anticipate that the patient will be able to be discharged home.  This is certainly not typical anginal pains and with 2- EKGs and troponins he should be able to go home and follow-up in the outpatient setting.  Recommend cardiology follow-up within the  week.  Patient agreeable  I discussed the care with Dr. Sallyanne Kuster of the cardiology service who agrees with this recommendation.   EKG Interpretation  Date/Time:  Tuesday September 04 2017 09:04:20 EDT Ventricular Rate:  56 PR Interval:  146 QRS Duration: 144 QT Interval:  476 QTC Calculation: 459 R Axis:   -52 Text Interpretation:  Sinus bradycardia Left axis deviation Left bundle branch block Abnormal ECG since 3/19, rate slower, no other changes Confirmed by Noemi Chapel [phone]) on 09/04/2017 11:50:16 AM       EKG Interpretation  Date/Time:  Tuesday September 04 2017 12:57:37 EDT Ventricular Rate:  49 PR Interval:  146 QRS Duration: 154 QT Interval:  466 QTC Calculation: 421 R Axis:   -49 Text Interpretation:  Sinus bradycardia Left bundle branch block Since last tracing rate slower Confirmed by Noemi Chapel [phone]) on 09/04/2017 1:48:12 PM          Noemi Chapel, MD 09/06/17 1015

## 2017-09-04 NOTE — Discharge Instructions (Signed)
Your work-up today was negative and your cardiac enzymes were normal.  Your EKG did not show any changes to suggest a heart attack.  Because of your continued chest pain you will need to make an appointment with your cardiologist next week for reevaluation of your symptoms.  If you have any new or worsening symptoms in the meantime then you will need to return to the emergency department for reevaluation immediately.

## 2017-09-04 NOTE — ED Triage Notes (Signed)
Pt reports onset of mid radiating CP to neck onset x 2 wks, denies n/v/d, pt hx of CABG, pt A&O x4

## 2017-09-04 NOTE — ED Provider Notes (Addendum)
[facility] EMERGENCY DEPARTMENT Provider Note   CSN: [id number] Arrival date & time: 09/04/17  0901     History   Chief Complaint Chief Complaint  Patient presents with  . Chest Pain    HPI [name] [name] is a 77 y.o. male.  HPI   Patient is a 77 year old male with history of anemia, atrial fibrillation, carotid stenosis s/p endarterectomy, CAD s/p CABG, HLD, HTN who presents the emergency department today for evaluation of chest pain.  Patient states that he has had chest pain for "a long time ".  States that since he recently increased his isosorbide 2 weeks ago he has had worsening of his intermittent chest pain and episodes have been becoming more frequent.  Today he states that after waking up from sleep and walking around his house he began to have 10/10 chest pain on the left side.  Pain radiated to the left arm and intermittently radiated to the jaw.  It felt like a burning sensation.  He took a nitroglycerin and his pain has not improved to a 2/10.  Pain was associated with shortness of breath.  Denies any nausea or vomiting.  Has had diaphoresis with his chest pain for the last 2 weeks but none significantly today.  He denies any cough, URI symptoms, fevers, leg swelling.  No abdominal pain or urinary symptoms. States he has been compliant with his GERD medications.  Last LEXIsacn on 08/08/2014 showed an EF of 50% with mildly decreased left ventricular ejection fraction at 45 to 54%.  Felt to be low risk study with medium size, moderate intensity, fixed septal defect suggestive of prior infarct versus LBBB artifact, no ischemia, EF 50 with mild global hypokinesis.  Reviewed records. Patient has a history of chronic angina.  During his last visit with his cardiology NP on 08/20/2017 it was documented that his isosorbide was decreased because he was feeling dizzy.  After decreasing this medication he continued to feel dizzy and had chest pain as well.  The isosorbide  was again increased to 30 mg daily.  Past Medical History:  Diagnosis Date  . Anemia 10/06/2011  . Anginal pain ([place])   . Arthritis    "joints hurt; shoulders, arms, back" (08/13/2013)  . Atrial fibrillation ([place])   . B12 deficiency   . Carotid stenosis 04/10/11   a. s/p left carotid endarterectomy 04/14/2011.;  b.  Carotid US (11/13):  L CEA ok; RICA 1-39%  . Chronic bronchitis ([place])    "get it q yr"  . Chronic chest pain   . Chronic lower back pain   . Colon polyp    adenomatous  . Coronary artery disease    a. S/p CABG in 2001. b. S/p DES to protected LM and BMS to RCA 2006. c. 12/2009: s/p DES to Cx;  d. 09/2011 Cath: patent stents, patent grafts -->Med Rx.;  e. CP with abnl Nuc => LHC 10/04/12: oLM 30%, dLM stent into the CFX patent, LAD occluded, LIMA-LAD patent, RI 30%, mid AVCFX 30%, oOM1 50%, oRCA stent patent, mRCA 50-60%, Radial graft-Dx patent; EF 55%.=> Med Rx  . Daily headache    "for the last couple weeks" (08/13/2013)  . Diverticulosis   . Esophageal stricture   . GERD (gastroesophageal reflux disease)   . Hemorrhoids   . Hiatal hernia   . Hyperlipidemia   . Hypertension   . Left bundle branch block   . Pancreatitis Dec 2011   ERCP ok.  . Prostate cancer (  [place])    s/p cryoablation  . Renal cyst    Seen on CT 08/2011 also with circumferential bladder wall thickening  . Skin cancer    "cut it off my right ear"  . Statin intolerance     Patient Active Problem List   Diagnosis Date Noted  . AKI (acute kidney injury) ([place]) 10/29/2015  . Dehydration 10/28/2015  . Chest pain 10/27/2015  . Atypical chest pain 08/03/2014  . Abdominal pain, chronic, epigastric 08/26/2013  . Abdominal pain, lower 08/15/2013  . CAD (coronary artery disease) 10/05/2012  . Chest pain syndrome 10/06/2011  . Anemia 10/06/2011  . Memory loss 04/07/2011  . ANXIETY 02/01/2010  . ABDOMINAL PAIN-EPIGASTRIC 02/01/2010  . ADENOCARCINOMA, PROSTATE 12/22/2008  . DYSPHAGIA UNSPECIFIED 11/18/2008    . COLONIC POLYPS, HX OF 11/18/2008  . HYPERCHOLESTEROLEMIA, PURE 01/20/2008  . HYPERTENSION, BENIGN 01/20/2008  . CAD, ARTERY BYPASS GRAFT 01/20/2008  . LBBB 01/20/2008  . Carotid stenosis 01/20/2008  . GERD 01/20/2008  . CHEST PAIN, NON-CARDIAC 01/20/2008    Past Surgical History:  Procedure Laterality Date  . CARDIAC CATHETERIZATION    . CHOLECYSTECTOMY    . COLONOSCOPY    . CORONARY ANGIOPLASTY WITH STENT PLACEMENT     "1 + 1"  . CORONARY ARTERY BYPASS GRAFT  2001  . ENDARTERECTOMY  04/14/2011   Procedure: ENDARTERECTOMY CAROTID;  Surgeon: Mal Misty, MD;  Location: [facility];  Service: Vascular;  Laterality: Left;  Would like to perform procedure first, at 0730  . EYE SURGERY     cataract  . INGUINAL HERNIA REPAIR Bilateral   . LEFT HEART CATHETERIZATION WITH CORONARY/GRAFT ANGIOGRAM N/A 10/05/2011   Procedure: LEFT HEART CATHETERIZATION WITH Beatrix Fetters;  Surgeon: Hillary Bow, MD;  Location[facility] C[facility] LAB;  Service: Cardiovascular;  Laterality: N/A;  . LEFT HEART CATHETERIZATION WITH CORONARY/GRAFT ANGIOGRAM N/A 10/04/2012   Procedure: LEFT HEART CATHETERIZATION WITH Beatrix Fetters;  Surgeon: Jolaine Artist, MD;  Location: [facility] CATH LAB;  Service: Cardiovascular;  Laterality: N/A;  . POLYPECTOMY    . PR VEIN BYPASS GRAFT,AORTO-FEM-POP    . PROSTATE CRYOABLATION          Home Medications    Prior to Admission medications   Medication Sig Start Date End Date Taking? Authorizing Provider  amLODipine (NORVASC) 10 MG tablet Take 10 mg by mouth daily.   Yes [provider]  aspirin EC 81 MG tablet Take 81 mg by mouth at bedtime.    Yes [provider]  cyanocobalamin 1000 MCG tablet Take 1,000 mcg by mouth daily.   Yes [provider]  isosorbide mononitrate (IMDUR) 30 MG 24 hr tablet Take 1 tablet (30 mg total) by mouth daily. 08/20/17 11/18/17 Yes Lendon Colonel, NP  LORazepam (ATIVAN) 0.5 MG tablet Take 0.5 mg by  mouth daily as needed for anxiety. 07/17/16  Yes [provider]  metoCLOPramide (REGLAN) 10 MG tablet Take 10 mg by mouth daily as needed for nausea.   Yes [provider]  metoprolol tartrate (LOPRESSOR) 25 MG tablet Take 25 mg by mouth 2 (two) times daily.   Yes [provider]  nitroGLYCERIN (NITROSTAT) 0.4 MG SL tablet Place 1 tablet (0.4 mg total) under the tongue every 5 (five) minutes as needed. For chest pain 07/21/13  Yes Richardson Dopp T, PA-C  pantoprazole (PROTONIX) 40 MG tablet Take 40 mg by mouth 2 (two) times daily.    Yes [provider]  PARoxetine (PAXIL) 20 MG tablet Take  20 mg by mouth daily. 04/18/17  Yes [provider]  Simethicone 125 MG CAPS Take 1 capsule (125 mg total) by mouth daily as needed. Patient taking differently: Take 1 capsule by mouth daily as needed (gas).  05/16/17  Yes Lendon Colonel, NP    Family History Family History  Problem Relation Age of Onset  . Heart disease Mother        Heart Disease before age 47  . Hypertension Mother   . Heart attack Mother   . Lung cancer Father   . Hypertension Sister   . Heart disease Sister        Heart Disease before age 21  . Cancer Sister   . Heart attack Sister   . Hypertension Brother   . Heart disease Brother        Heart Disease before age 36  . Heart attack Brother   . Colon cancer Neg Hx     Social History Social History   Tobacco Use  . Smoking status: Former Smoker    Years: 0.10    Types: Cigarettes  . Smokeless tobacco: Current User    Types: Chew  . Tobacco comment: "smoked a few cigarettes; no more than 1 month"  Substance Use Topics  . Alcohol use: No    Alcohol/week: 0.0 standard drinks    Comment: "no alcohol since I was a teenager"  . Drug use: No     Allergies   Shellfish allergy; Zolpidem tartrate; Amoxicillin-pot clavulanate; Codeine; Erythromycin; Hydrocodone; Morphine; Nitrofuran derivatives; Oxycodone hcl; and  Tramadol   Review of Systems Review of Systems  Constitutional: Negative for chills and fever.  HENT: Negative for congestion, rhinorrhea and sore throat.   Eyes: Negative for visual disturbance.  Respiratory: Positive for shortness of breath. Negative for cough.   Cardiovascular: Positive for chest pain. Negative for palpitations and leg swelling.  Gastrointestinal: Negative for abdominal pain, constipation, diarrhea and nausea.  Genitourinary: Negative for flank pain.  Neurological: Negative for dizziness, light-headedness and headaches.   Physical Exam Updated Vital Signs BP (!) 150/61   Pulse (!) 46   Temp 97.7 F (36.5 C) (Oral)   Resp 14   Ht 5\' 4"  (1.626 m)   Wt 63.5 kg   SpO2 99%   BMI 24.03 kg/m   Physical Exam  Constitutional: He appears well-developed and well-nourished.  No acute distress.  HENT:  Head: Normocephalic and atraumatic.  Eyes: Conjunctivae are normal.  Neck: Neck supple.  Cardiovascular: Normal rate and regular rhythm.  No murmur heard. Pulses:      Dorsalis pedis pulses are 2+ on the right side, and 2+ on the left side.  Pulmonary/Chest: Effort normal and breath sounds normal. No respiratory distress. He has no decreased breath sounds. He has no wheezes. He has no rhonchi. He has no rales.  Abdominal: Soft. Bowel sounds are normal. He exhibits no distension. There is no tenderness.  Musculoskeletal: He exhibits no edema.       Right lower leg: Normal. He exhibits no tenderness and no edema.       Left lower leg: Normal. He exhibits no tenderness and no edema.  Neurological: He is alert.  Skin: Skin is warm and dry.  Psychiatric: He has a normal mood and affect.  Nursing note and vitals reviewed.  ED Treatments / Results  Labs (all labs ordered are listed, but only abnormal results are displayed) Labs Reviewed  BASIC METABOLIC PANEL - Abnormal; Notable for the following components:  Result Value   Glucose, Bld 113 (*)    Creatinine,  Ser 1.32 (*)    Calcium 8.7 (*)    GFR calc non Af Amer 50 (*)    GFR calc Af Amer 58 (*)    All other components within normal limits  BRAIN NATRIURETIC PEPTIDE - Abnormal; Notable for the following components:   B Natriuretic Peptide 153.8 (*)    All other components within normal limits  CBC  I-STAT TROPONIN, ED  I-STAT TROPONIN, ED    EKG EKG Interpretation  Date/Time:  Tuesday September 04 2017 09:04:20 EDT Ventricular Rate:  56 PR Interval:  146 QRS Duration: 144 QT Interval:  476 QTC Calculation: 459 R Axis:   -52 Text Interpretation:  Sinus bradycardia Left axis deviation Left bundle branch block Abnormal ECG since 3/19, rate slower, no other changes Confirmed by Noemi Chapel [phone]) on 09/04/2017 11:50:16 AM    EKG Interpretation  Date/Time:  Tuesday September 04 2017 12:57:37 EDT Ventricular Rate:  49 PR Interval:  146 QRS Duration: 154 QT Interval:  466 QTC Calculation: 421 R Axis:   -49 Text Interpretation:  Sinus bradycardia Left bundle branch block Since last tracing rate slower Confirmed by Noemi Chapel [id number]) on 09/04/2017 1:48:12 PM        Radiology Dg Chest 2 View  Result Date: 09/04/2017 CLINICAL DATA:  Chest pain. EXAM: CHEST - 2 VIEW COMPARISON:  Radiographs of April 20, 2017. FINDINGS: Stable cardiomediastinal silhouette. Status post coronary artery bypass graft. Stable left hilar surgical clips. No pneumothorax or pleural effusion is noted. Both lungs are clear. The visualized skeletal structures are unremarkable. IMPRESSION: No active cardiopulmonary disease. Electronically Signed   By: Marijo Conception, M.D.   On: 09/04/2017 09:34    Procedures Procedures (including critical care time)  Medications Ordered in ED Medications  aspirin chewable tablet 324 mg (324 mg Oral Given 09/04/17 1300)     Initial Impression / Assessment and Plan / ED Course  I have reviewed the triage vital signs and the nursing notes.  Pertinent labs & imaging results  that were available during my care of the patient were reviewed by me and considered in my medical decision making (see chart for details).  Clinical Course as of Sep 05 1502  Tue Sep 04, 2017  1438 The patient's EKG show persistent ST elevation in lead aVR, the rest of the EKG is unchanged, these findings are chronic over time.   [BM]    Clinical Course User Index [BM] Noemi Chapel, MD  1:56 PM Dr. Sabra Heck consulted with cardiology who recommended outpatient follow-up in the office next week.  2:54 PM rechecked pt. He states his cp has improved and he has had no recurrence of sxs.  He denies any shortness of breath.  Discussed findings and plan for follow-up with cardiology next week.  Him and his family at bedside understand the plan and reasons to return immediately to the ED.  All questions answered.  Final Clinical Impressions(s) / ED Diagnoses   Final diagnoses:  Chest pain, unspecified type   Patient presenting with intermittent chest pain for the last 2 weeks after changing his isosorbide dosing.  Has been intermittently mildly hypertensive in the ED but otherwise vitals have been stable with mild bradycardia.  Chest pain has been improved since he took nitro at home.  He has a diagnosis of chronic chest pain and was recently seen by cardiology 2 weeks ago.  Today his work-up is fairly benign.  He has had a negative delta troponin.  His ECGs both show sinus brady, with left bundle branch block, unchanged from prior studies. CXR negative for infiltrate, pneumothorax or widened mediastinum.  Creatinine is mildly elevated today to 1.32, increased from 4 months ago 1.28.  CBC without leukocytosis or anemia. BNP mildly elevated at 153. Case was staffed with cardiology who is comfortable with the plan for outpatient follow-up next week.  Findings and plan discussed with patient and family members at bedside who understand plan reasons to return immediately to the ED.  All questions  answered.  ED Discharge Orders    None       Rodney Booze, PA-C 09/04/17 [address] [place], PA-C 09/04/17 1504    Noemi Chapel, MD 09/06/17 1016

## 2017-09-04 NOTE — ED Notes (Signed)
ED Provider at bedside. 

## 2017-09-08 DIAGNOSIS — R079 Chest pain, unspecified: Secondary | ICD-10-CM | POA: Diagnosis not present

## 2017-09-08 DIAGNOSIS — I252 Old myocardial infarction: Secondary | ICD-10-CM | POA: Diagnosis not present

## 2017-09-08 DIAGNOSIS — I10 Essential (primary) hypertension: Secondary | ICD-10-CM | POA: Diagnosis not present

## 2017-09-08 DIAGNOSIS — Z951 Presence of aortocoronary bypass graft: Secondary | ICD-10-CM | POA: Diagnosis not present

## 2017-09-08 DIAGNOSIS — I209 Angina pectoris, unspecified: Secondary | ICD-10-CM | POA: Diagnosis not present

## 2017-09-08 DIAGNOSIS — F419 Anxiety disorder, unspecified: Secondary | ICD-10-CM | POA: Diagnosis not present

## 2017-09-08 DIAGNOSIS — Z8546 Personal history of malignant neoplasm of prostate: Secondary | ICD-10-CM | POA: Diagnosis not present

## 2017-09-08 DIAGNOSIS — I208 Other forms of angina pectoris: Secondary | ICD-10-CM | POA: Diagnosis not present

## 2017-09-09 ENCOUNTER — Inpatient Hospital Stay (HOSPITAL_COMMUNITY)
Admission: EM | Admit: 2017-09-09 | Discharge: 2017-09-12 | DRG: 247 | Disposition: A | Discharge status: Home / Self Care | Payer: Medicare Other | Attending: Cardiovascular Disease | Admitting: Cardiovascular Disease

## 2017-09-09 ENCOUNTER — Other Ambulatory Visit: Payer: Self-pay

## 2017-09-09 ENCOUNTER — Encounter (HOSPITAL_COMMUNITY): Payer: Self-pay | Admitting: Emergency Medicine

## 2017-09-09 ENCOUNTER — Emergency Department (HOSPITAL_COMMUNITY): Payer: Medicare Other

## 2017-09-09 DIAGNOSIS — R072 Precordial pain: Secondary | ICD-10-CM

## 2017-09-09 DIAGNOSIS — Z888 Allergy status to other drugs, medicaments and biological substances status: Secondary | ICD-10-CM

## 2017-09-09 DIAGNOSIS — I25110 Atherosclerotic heart disease of native coronary artery with unstable angina pectoris: Secondary | ICD-10-CM | POA: Diagnosis present

## 2017-09-09 DIAGNOSIS — I447 Left bundle-branch block, unspecified: Secondary | ICD-10-CM | POA: Diagnosis not present

## 2017-09-09 DIAGNOSIS — R112 Nausea with vomiting, unspecified: Secondary | ICD-10-CM | POA: Diagnosis not present

## 2017-09-09 DIAGNOSIS — Y838 Other surgical procedures as the cause of abnormal reaction of the patient, or of later complication, without mention of misadventure at the time of the procedure: Secondary | ICD-10-CM | POA: Diagnosis present

## 2017-09-09 DIAGNOSIS — T82855A Stenosis of coronary artery stent, initial encounter: Principal | ICD-10-CM | POA: Diagnosis present

## 2017-09-09 DIAGNOSIS — R7989 Other specified abnormal findings of blood chemistry: Secondary | ICD-10-CM | POA: Diagnosis present

## 2017-09-09 DIAGNOSIS — K219 Gastro-esophageal reflux disease without esophagitis: Secondary | ICD-10-CM | POA: Diagnosis present

## 2017-09-09 DIAGNOSIS — G8929 Other chronic pain: Secondary | ICD-10-CM | POA: Diagnosis present

## 2017-09-09 DIAGNOSIS — Z95820 Peripheral vascular angioplasty status with implants and grafts: Secondary | ICD-10-CM

## 2017-09-09 DIAGNOSIS — R0789 Other chest pain: Secondary | ICD-10-CM | POA: Diagnosis not present

## 2017-09-09 DIAGNOSIS — R0602 Shortness of breath: Secondary | ICD-10-CM | POA: Diagnosis not present

## 2017-09-09 DIAGNOSIS — Z8249 Family history of ischemic heart disease and other diseases of the circulatory system: Secondary | ICD-10-CM

## 2017-09-09 DIAGNOSIS — Z801 Family history of malignant neoplasm of trachea, bronchus and lung: Secondary | ICD-10-CM

## 2017-09-09 DIAGNOSIS — Z87891 Personal history of nicotine dependence: Secondary | ICD-10-CM

## 2017-09-09 DIAGNOSIS — Z7982 Long term (current) use of aspirin: Secondary | ICD-10-CM

## 2017-09-09 DIAGNOSIS — Z881 Allergy status to other antibiotic agents status: Secondary | ICD-10-CM

## 2017-09-09 DIAGNOSIS — Z951 Presence of aortocoronary bypass graft: Secondary | ICD-10-CM

## 2017-09-09 DIAGNOSIS — Z955 Presence of coronary angioplasty implant and graft: Secondary | ICD-10-CM

## 2017-09-09 DIAGNOSIS — I200 Unstable angina: Secondary | ICD-10-CM | POA: Diagnosis present

## 2017-09-09 DIAGNOSIS — Z91013 Allergy to seafood: Secondary | ICD-10-CM

## 2017-09-09 DIAGNOSIS — I252 Old myocardial infarction: Secondary | ICD-10-CM | POA: Diagnosis not present

## 2017-09-09 DIAGNOSIS — N179 Acute kidney failure, unspecified: Secondary | ICD-10-CM | POA: Diagnosis not present

## 2017-09-09 DIAGNOSIS — Z8546 Personal history of malignant neoplasm of prostate: Secondary | ICD-10-CM

## 2017-09-09 DIAGNOSIS — E876 Hypokalemia: Secondary | ICD-10-CM | POA: Diagnosis present

## 2017-09-09 DIAGNOSIS — Z9049 Acquired absence of other specified parts of digestive tract: Secondary | ICD-10-CM

## 2017-09-09 DIAGNOSIS — I10 Essential (primary) hypertension: Secondary | ICD-10-CM | POA: Diagnosis present

## 2017-09-09 DIAGNOSIS — Z79899 Other long term (current) drug therapy: Secondary | ICD-10-CM

## 2017-09-09 DIAGNOSIS — Z86010 Personal history of colonic polyps: Secondary | ICD-10-CM

## 2017-09-09 DIAGNOSIS — Z885 Allergy status to narcotic agent status: Secondary | ICD-10-CM

## 2017-09-09 DIAGNOSIS — E785 Hyperlipidemia, unspecified: Secondary | ICD-10-CM | POA: Diagnosis present

## 2017-09-09 DIAGNOSIS — F419 Anxiety disorder, unspecified: Secondary | ICD-10-CM | POA: Diagnosis present

## 2017-09-09 DIAGNOSIS — Z85828 Personal history of other malignant neoplasm of skin: Secondary | ICD-10-CM

## 2017-09-09 DIAGNOSIS — R079 Chest pain, unspecified: Secondary | ICD-10-CM | POA: Diagnosis not present

## 2017-09-09 DIAGNOSIS — K449 Diaphragmatic hernia without obstruction or gangrene: Secondary | ICD-10-CM | POA: Diagnosis present

## 2017-09-09 LAB — CBC
HCT: 39.9 % (ref 39.0–52.0)
Hemoglobin: 13.3 g/dL (ref 13.0–17.0)
MCH: 30.1 pg (ref 26.0–34.0)
MCHC: 33.3 g/dL (ref 30.0–36.0)
MCV: 90.3 fL (ref 78.0–100.0)
PLATELETS: 190 10*3/uL (ref 150–400)
RBC: 4.42 MIL/uL (ref 4.22–5.81)
RDW: 12.8 % (ref 11.5–15.5)
WBC: 5.9 10*3/uL (ref 4.0–10.5)

## 2017-09-09 LAB — I-STAT TROPONIN, ED: TROPONIN I, POC: 0.03 ng/mL (ref 0.00–0.08)

## 2017-09-09 LAB — BASIC METABOLIC PANEL
Anion gap: 6 (ref 5–15)
BUN: 12 mg/dL (ref 8–23)
CALCIUM: 8.1 mg/dL — AB (ref 8.9–10.3)
CO2: 25 mmol/L (ref 22–32)
CREATININE: 1.36 mg/dL — AB (ref 0.61–1.24)
Chloride: 104 mmol/L (ref 98–111)
GFR calc non Af Amer: 49 mL/min — ABNORMAL LOW (ref >60)
GFR, EST AFRICAN AMERICAN: 56 mL/min — AB (ref >60)
Glucose, Bld: 144 mg/dL — ABNORMAL HIGH (ref 70–99)
Potassium: 3.4 mmol/L — ABNORMAL LOW (ref 3.5–5.1)
SODIUM: 135 mmol/L (ref 135–145)

## 2017-09-09 MED ORDER — ASPIRIN 300 MG RE SUPP: 300.0000 mg | RECTAL | Status: AC

## 2017-09-09 MED ORDER — LORAZEPAM 0.5 MG PO TABS
0.5000 mg | ORAL_TABLET | Freq: Every day | ORAL | Status: DC | PRN
  Administered 2017-09-10: 0.5 mg via ORAL
  Filled 2017-09-09: qty 1

## 2017-09-09 MED ORDER — PAROXETINE HCL 20 MG PO TABS
20.0000 mg | ORAL_TABLET | Freq: Every day | ORAL | Status: DC
  Administered 2017-09-10 – 2017-09-11 (×2): 20 mg via ORAL
  Filled 2017-09-09 (×3): qty 1

## 2017-09-09 MED ORDER — ACETAMINOPHEN 325 MG PO TABS
650.0000 mg | ORAL_TABLET | ORAL | Status: DC | PRN
  Administered 2017-09-09 – 2017-09-11 (×3): 650 mg via ORAL
  Filled 2017-09-09 (×4): qty 2

## 2017-09-09 MED ORDER — VITAMIN B-12 1000 MCG PO TABS
1000.0000 ug | ORAL_TABLET | Freq: Every day | ORAL | Status: DC
  Administered 2017-09-10 – 2017-09-11 (×2): 1000 ug via ORAL
  Filled 2017-09-09 (×3): qty 1

## 2017-09-09 MED ORDER — NITROGLYCERIN 0.4 MG SL SUBL
0.4000 mg | SUBLINGUAL_TABLET | SUBLINGUAL | Status: DC | PRN
  Administered 2017-09-09 – 2017-09-11 (×9): 0.4 mg via SUBLINGUAL
  Filled 2017-09-09 (×3): qty 1

## 2017-09-09 MED ORDER — ASPIRIN 81 MG PO CHEW
324.0000 mg | CHEWABLE_TABLET | ORAL | Status: AC
  Administered 2017-09-10: 324 mg via ORAL
  Filled 2017-09-09: qty 4

## 2017-09-09 MED ORDER — METOPROLOL TARTRATE 25 MG PO TABS
25.0000 mg | ORAL_TABLET | Freq: Two times a day (BID) | ORAL | Status: DC
  Administered 2017-09-10 – 2017-09-12 (×4): 25 mg via ORAL
  Filled 2017-09-09 (×4): qty 1

## 2017-09-09 MED ORDER — PANTOPRAZOLE SODIUM 40 MG PO TBEC
40.0000 mg | DELAYED_RELEASE_TABLET | Freq: Two times a day (BID) | ORAL | Status: DC
  Administered 2017-09-10 – 2017-09-12 (×4): 40 mg via ORAL
  Filled 2017-09-09 (×4): qty 1

## 2017-09-09 MED ORDER — FAMOTIDINE 20 MG PO TABS
20.0000 mg | ORAL_TABLET | Freq: Two times a day (BID) | ORAL | Status: DC
  Administered 2017-09-10 (×2): 20 mg via ORAL
  Filled 2017-09-09 (×2): qty 1

## 2017-09-09 MED ORDER — NITROGLYCERIN 0.4 MG SL SUBL
SUBLINGUAL_TABLET | SUBLINGUAL | Status: AC
  Filled 2017-09-09: qty 1

## 2017-09-09 MED ORDER — ATORVASTATIN CALCIUM 40 MG PO TABS
40.0000 mg | ORAL_TABLET | Freq: Every day | ORAL | Status: DC
  Administered 2017-09-10 – 2017-09-11 (×2): 40 mg via ORAL
  Filled 2017-09-09 (×2): qty 1

## 2017-09-09 MED ORDER — ONDANSETRON HCL 4 MG/2ML IJ SOLN: 4.0000 mg | Freq: Four times a day (QID) | INTRAMUSCULAR | Status: DC | PRN

## 2017-09-09 MED ORDER — ASPIRIN EC 81 MG PO TBEC: 81.0000 mg | DELAYED_RELEASE_TABLET | Freq: Every day | ORAL | Status: DC

## 2017-09-09 MED ORDER — HEPARIN SODIUM (PORCINE) 5000 UNIT/ML IJ SOLN
5000.0000 [IU] | Freq: Three times a day (TID) | INTRAMUSCULAR | Status: DC
  Administered 2017-09-10 – 2017-09-12 (×4): 5000 [IU] via SUBCUTANEOUS
  Filled 2017-09-09 (×5): qty 1

## 2017-09-09 MED ORDER — SIMETHICONE 80 MG PO CHEW
80.0000 mg | CHEWABLE_TABLET | Freq: Every day | ORAL | Status: DC | PRN
  Filled 2017-09-09: qty 1

## 2017-09-09 MED ORDER — ASPIRIN EC 81 MG PO TBEC
81.0000 mg | DELAYED_RELEASE_TABLET | Freq: Every day | ORAL | Status: DC
  Administered 2017-09-10: 81 mg via ORAL
  Filled 2017-09-09: qty 1

## 2017-09-09 MED ORDER — ISOSORBIDE MONONITRATE ER 30 MG PO TB24
30.0000 mg | ORAL_TABLET | Freq: Every day | ORAL | Status: DC
  Administered 2017-09-10: 30 mg via ORAL
  Filled 2017-09-09: qty 1

## 2017-09-09 MED ORDER — AMLODIPINE BESYLATE 10 MG PO TABS
10.0000 mg | ORAL_TABLET | Freq: Every day | ORAL | Status: DC
  Administered 2017-09-10 – 2017-09-12 (×2): 10 mg via ORAL
  Filled 2017-09-09: qty 1
  Filled 2017-09-09: qty 2

## 2017-09-09 MED ORDER — METOCLOPRAMIDE HCL 10 MG PO TABS
10.0000 mg | ORAL_TABLET | Freq: Every day | ORAL | Status: DC | PRN
  Filled 2017-09-09: qty 1

## 2017-09-09 NOTE — ED Triage Notes (Signed)
Per [facility], Pt from home. Pt arrives with complaints of CP x 2 weeks. Pt seen at [facility] today and told to follow up with cardiologist tomorrow morning for possible moving stent. Pt reports 7/10 CP is on L side radiates to L arm and R chest. Pt reports some nausea, SHOB, and dizziness. Pt took 3 nitro at home and received 2 nitro en route via EMS for a total of 5 nitro. Pt also received 324 ASA and 4 mg of Zofran en route via EMS. Pt reports some relief of CP with nitro. Pt A&O x4, in no acute distress, VSS

## 2017-09-09 NOTE — ED Provider Notes (Signed)
[facility] EMERGENCY DEPARTMENT Provider Note   CSN: [id number] Arrival date & time: 09/09/17  2109     History   Chief Complaint Chief Complaint  Patient presents with  . Chest Pain    HPI [name] [name] is a 77 y.o. male.  The history is provided by the patient and medical records. No language interpreter was used.  Chest Pain   This is a recurrent problem. The current episode started more than 2 days ago. The problem occurs daily. The problem has been gradually worsening. The pain is associated with exertion. The pain is present in the substernal region. The pain is at a severity of 10/10. The pain is severe. The quality of the pain is described as heavy, pressure-like, exertional and dull. The pain radiates to the right arm, right neck, left neck and left arm. The symptoms are aggravated by exertion. Associated symptoms include diaphoresis, exertional chest pressure, malaise/fatigue, nausea, shortness of breath and vomiting. Pertinent negatives include no abdominal pain, no back pain, no cough, no fever, no headaches, no irregular heartbeat, no lower extremity edema, no numbness and no palpitations. He has tried nitroglycerin for the symptoms. The treatment provided moderate relief. Risk factors include male gender.  His past medical history is significant for CAD and MI.  Pertinent negatives for past medical history include no seizures.    Past Medical History:  Diagnosis Date  . Anemia 10/06/2011  . Anginal pain ([place])   . Arthritis    "joints hurt; shoulders, arms, back" (08/13/2013)  . Atrial fibrillation ([place])   . B12 deficiency   . Carotid stenosis 04/10/11   a. s/p left carotid endarterectomy 04/14/2011.;  b.  Carotid US (11/13):  L CEA ok; RICA 1-39%  . Chronic bronchitis ([place])    "get it q yr"  . Chronic chest pain   . Chronic lower back pain   . Colon polyp    adenomatous  . Coronary artery disease    a. S/p CABG in 2001. b. S/p DES to protected  LM and BMS to RCA 2006. c. 12/2009: s/p DES to Cx;  d. 09/2011 Cath: patent stents, patent grafts -->Med Rx.;  e. CP with abnl Nuc => LHC 10/04/12: oLM 30%, dLM stent into the CFX patent, LAD occluded, LIMA-LAD patent, RI 30%, mid AVCFX 30%, oOM1 50%, oRCA stent patent, mRCA 50-60%, Radial graft-Dx patent; EF 55%.=> Med Rx  . Daily headache    "for the last couple weeks" (08/13/2013)  . Diverticulosis   . Esophageal stricture   . GERD (gastroesophageal reflux disease)   . Hemorrhoids   . Hiatal hernia   . Hyperlipidemia   . Hypertension   . Left bundle branch block   . Pancreatitis Dec 2011   ERCP ok.  . Prostate cancer [facility])    s/p cryoablation  . Renal cyst    Seen on CT 08/2011 also with circumferential bladder wall thickening  . Skin cancer    "cut it off my right ear"  . Statin intolerance     Patient Active Problem List   Diagnosis Date Noted  . AKI (acute kidney injury) ([place]) 10/29/2015  . Dehydration 10/28/2015  . Chest pain 10/27/2015  . Atypical chest pain 08/03/2014  . Abdominal pain, chronic, epigastric 08/26/2013  . Abdominal pain, lower 08/15/2013  . CAD (coronary artery disease) 10/05/2012  . Chest pain syndrome 10/06/2011  . Anemia 10/06/2011  . Memory loss 04/07/2011  . ANXIETY 02/01/2010  . ABDOMINAL PAIN-EPIGASTRIC 02/01/2010  .  ADENOCARCINOMA, PROSTATE 12/22/2008  . DYSPHAGIA UNSPECIFIED 11/18/2008  . COLONIC POLYPS, HX OF 11/18/2008  . HYPERCHOLESTEROLEMIA, PURE 01/20/2008  . HYPERTENSION, BENIGN 01/20/2008  . CAD, ARTERY BYPASS GRAFT 01/20/2008  . LBBB 01/20/2008  . Carotid stenosis 01/20/2008  . GERD 01/20/2008  . CHEST PAIN, NON-CARDIAC 01/20/2008    Past Surgical History:  Procedure Laterality Date  . CARDIAC CATHETERIZATION    . CHOLECYSTECTOMY    . COLONOSCOPY    . CORONARY ANGIOPLASTY WITH STENT PLACEMENT     "1 + 1"  . CORONARY ARTERY BYPASS GRAFT  2001  . ENDARTERECTOMY  04/14/2011   Procedure: ENDARTERECTOMY CAROTID;  Surgeon: Mal Misty, MD;  Location: [place];  Service: Vascular;  Laterality: Left;  Would like to perform procedure first, at 0730  . EYE SURGERY     cataract  . INGUINAL HERNIA REPAIR Bilateral   . LEFT HEART CATHETERIZATION WITH CORONARY/GRAFT ANGIOGRAM N/A 10/05/2011   Procedure: LEFT HEART CATHETERIZATION WITH Beatrix Fetters;  Surgeon: Hillary Bow, MD;  Location: [facility] CATH LAB;  Service: Cardiovascular;  Laterality: N/A;  . LEFT HEART CATHETERIZATION WITH CORONARY/GRAFT ANGIOGRAM N/A 10/04/2012   Procedure: LEFT HEART CATHETERIZATION WITH Beatrix Fetters;  Surgeon: Jolaine Artist, MD;  Location: [facility] CATH LAB;  Service: Cardiovascular;  Laterality: N/A;  . POLYPECTOMY    . PR VEIN BYPASS GRAFT,AORTO-FEM-POP    . PROSTATE CRYOABLATION          Home Medications    Prior to Admission medications   Medication Sig Start Date End Date Taking? Authorizing Provider  amLODipine (NORVASC) 10 MG tablet Take 10 mg by mouth daily.    [provider]  aspirin EC 81 MG tablet Take 81 mg by mouth at bedtime.     [provider]  cyanocobalamin 1000 MCG tablet Take 1,000 mcg by mouth daily.    [provider]  isosorbide mononitrate (IMDUR) 30 MG 24 hr tablet Take 1 tablet (30 mg total) by mouth daily. 08/20/17 11/18/17  Lendon Colonel, NP  LORazepam (ATIVAN) 0.5 MG tablet Take 0.5 mg by mouth daily as needed for anxiety. 07/17/16   [provider]  metoCLOPramide (REGLAN) 10 MG tablet Take 10 mg by mouth daily as needed for nausea.    [provider]  metoprolol tartrate (LOPRESSOR) 25 MG tablet Take 25 mg by mouth 2 (two) times daily.    [provider]  nitroGLYCERIN (NITROSTAT) 0.4 MG SL tablet Place 1 tablet (0.4 mg total) under the tongue every 5 (five) minutes as needed. For chest pain 07/21/13   Richardson Dopp T, PA-C  pantoprazole (PROTONIX) 40 MG tablet Take 40 mg by mouth 2 (two) times daily.     [provider]    PARoxetine (PAXIL) 20 MG tablet Take 20 mg by mouth daily. 04/18/17   [provider]  Simethicone 125 MG CAPS Take 1 capsule (125 mg total) by mouth daily as needed. Patient taking differently: Take 1 capsule by mouth daily as needed (gas).  05/16/17   Lendon Colonel, NP    Family History Family History  Problem Relation Age of Onset  . Heart disease Mother        Heart Disease before age 63  . Hypertension Mother   . Heart attack Mother   . Lung cancer Father   . Hypertension Sister   . Heart disease Sister        Heart Disease before age 22  . Cancer Sister   .  Heart attack Sister   . Hypertension Brother   . Heart disease Brother        Heart Disease before age 72  . Heart attack Brother   . Colon cancer Neg Hx     Social History Social History   Tobacco Use  . Smoking status: Former Smoker    Years: 0.10    Types: Cigarettes  . Smokeless tobacco: Current User    Types: Chew  . Tobacco comment: "smoked a few cigarettes; no more than 1 month"  Substance Use Topics  . Alcohol use: No    Alcohol/week: 0.0 standard drinks    Comment: "no alcohol since I was a teenager"  . Drug use: No     Allergies   Shellfish allergy; Zolpidem tartrate; Amoxicillin-pot clavulanate; Codeine; Erythromycin; Hydrocodone; Morphine; Nitrofuran derivatives; Oxycodone hcl; and Tramadol   Review of Systems Review of Systems  Constitutional: Positive for diaphoresis, fatigue and malaise/fatigue. Negative for appetite change, chills and fever.  HENT: Negative for congestion and rhinorrhea.   Eyes: Negative for visual disturbance.  Respiratory: Positive for shortness of breath. Negative for cough, chest tightness and wheezing.   Cardiovascular: Positive for chest pain. Negative for palpitations.  Gastrointestinal: Positive for nausea and vomiting. Negative for abdominal pain, constipation and diarrhea.  Genitourinary: Negative for decreased urine volume, dysuria, flank pain  and frequency.  Musculoskeletal: Negative for back pain, neck pain and neck stiffness.  Skin: Negative for rash and wound.  Neurological: Negative for seizures, light-headedness, numbness and headaches.  Psychiatric/Behavioral: Negative for agitation and confusion.  All other systems reviewed and are negative.    Physical Exam Updated Vital Signs BP (!) 147/61   Pulse (!) 58   Temp 97.7 F (36.5 C) (Oral)   Resp 20   Ht 5\' 4"  (1.626 m)   Wt 62.6 kg   SpO2 100%   BMI 23.69 kg/m   Physical Exam  Constitutional: He appears well-developed and well-nourished. No distress.  HENT:  Head: Normocephalic and atraumatic.  Nose: Nose normal.  Mouth/Throat: Oropharynx is clear and moist. No oropharyngeal exudate.  Eyes: Pupils are equal, round, and reactive to light. Conjunctivae and EOM are normal.  Neck: Normal range of motion. Neck supple.  Cardiovascular: Normal rate, regular rhythm and intact distal pulses.  No murmur heard. Pulmonary/Chest: Effort normal and breath sounds normal. No stridor. No respiratory distress. He has no wheezes. He has no rales. He exhibits no tenderness.  Abdominal: Soft. He exhibits no distension. There is no tenderness.  Musculoskeletal: He exhibits no edema or tenderness.  Neurological: He is alert. No sensory deficit. He exhibits normal muscle tone.  Skin: Skin is warm and dry. Capillary refill takes less than 2 seconds. He is not diaphoretic. No erythema. No pallor.  Psychiatric: He has a normal mood and affect.  Nursing note and vitals reviewed.    ED Treatments / Results  Labs (all labs ordered are listed, but only abnormal results are displayed) Labs Reviewed  BASIC METABOLIC PANEL - Abnormal; Notable for the following components:      Result Value   Potassium 3.4 (*)    Glucose, Bld 144 (*)    Creatinine, Ser 1.36 (*)    Calcium 8.1 (*)    GFR calc non Af Amer 49 (*)    GFR calc Af Amer 56 (*)    All other components within normal limits   CBC  BASIC METABOLIC PANEL  TROPONIN I  I-STAT TROPONIN, ED    EKG EKG  Interpretation  Date/Time:  Sunday September 09 2017 21:12:16 EDT Ventricular Rate:  59 PR Interval:    QRS Duration: 158 QT Interval:  459 QTC Calculation: 455 R Axis:   -36 Text Interpretation:  Sinus rhythm Left bundle branch block when comapred to prior, no signifiacnt changes.  No STEMI Confirmed by Antony Blackbird [phone]) on 09/09/2017 9:16:34 PM   Radiology Dg Chest 2 View  Result Date: 09/09/2017 CLINICAL DATA:  Chest pain for 2 weeks EXAM: CHEST - 2 VIEW COMPARISON:  09/08/2017, 09/04/2017, 04/20/2017 FINDINGS: Post sternotomy changes. No acute airspace disease or pleural effusion. Stable cardiomediastinal silhouette with aortic atherosclerosis. No pneumothorax. IMPRESSION: No active cardiopulmonary disease. Electronically Signed   By: Donavan Foil M.D.   On: 09/09/2017 21:57    Procedures Procedures (including critical care time)  Medications Ordered in ED Medications  aspirin EC tablet 81 mg (has no administration in time range)  amLODipine (NORVASC) tablet 10 mg (has no administration in time range)  isosorbide mononitrate (IMDUR) 24 hr tablet 30 mg (has no administration in time range)  metoprolol tartrate (LOPRESSOR) tablet 25 mg (has no administration in time range)  LORazepam (ATIVAN) tablet 0.5 mg (has no administration in time range)  PARoxetine (PAXIL) tablet 20 mg (has no administration in time range)  metoCLOPramide (REGLAN) tablet 10 mg (has no administration in time range)  pantoprazole (PROTONIX) EC tablet 40 mg (has no administration in time range)  famotidine (PEPCID) tablet 20 mg (has no administration in time range)  simethicone (MYLICON) chewable tablet 80 mg (has no administration in time range)  vitamin B-12 (CYANOCOBALAMIN) tablet 1,000 mcg (has no administration in time range)  aspirin chewable tablet 324 mg (has no administration in time range)    Or  aspirin suppository  300 mg (has no administration in time range)  nitroGLYCERIN (NITROSTAT) SL tablet 0.4 mg (0.4 mg Sublingual Given 09/09/17 2314)  acetaminophen (TYLENOL) tablet 650 mg (650 mg Oral Given 09/09/17 2313)  ondansetron (ZOFRAN) injection 4 mg (has no administration in time range)  heparin injection 5,000 Units (has no administration in time range)  atorvastatin (LIPITOR) tablet 40 mg (has no administration in time range)     Initial Impression / Assessment and Plan / ED Course  I have reviewed the triage vital signs and the nursing notes.  Pertinent labs & imaging results that were available during my care of the patient were reviewed by me and considered in my medical decision making (see chart for details).     [name] [name] is a 77 y.o. male with a past medical history significant for CAD status post multiple PCI and CABG, hypertension, hypercholesterolemia, GERD, carotid stenosis, and A. fib who presents with chest pain.  He reports that her last 2 weeks he has had intermittent episodes of chest pain prompting him to seek evaluation.  He says that he had a work-up yesterday that showed negative troponins and he was instructed to see his cardiologist.  He says that today the pain worsened and was a pressure in his central chest.  He reports it radiates down both arms.  He has associated diaphoresis, nausea, and shortness of breath.  It is exertional.  He says this feels like his last heart attack.  He reports his pain was greatly improved after nitroglycerin with EMS.  On exam, lungs are clear and chest is nontender.  Abdomen is nontender.  Patient has symmetric pulses in upper and lower extreme ease.  Minimal lower extremity edema.  Patient appears  well otherwise.  EKG showed left bundle branch similar to prior.  No STEMI was seen.  Patient was intimately bradycardic into the 40s and 50s.  Initial troponin was negative.  CBC and BMP showed slight elevation in creatinine.  Due to his report  that his pain is the same as his prior MI and his history, cardiology was called.  Cardiology came to the patient and will admit him for further management.  Patient was given aspirin and started on heparin.   Final Clinical Impressions(s) / ED Diagnoses   Final diagnoses:  Precordial pain    ED Discharge Orders    None     Clinical Impression: 1. Precordial pain     Disposition: Admit  This note was prepared with assistance of Dragon voice recognition software. Occasional wrong-word or sound-a-like substitutions may have occurred due to the inherent limitations of voice recognition software.     Tegeler, Gwenyth Allegra, MD 09/10/17 [phone]

## 2017-09-10 ENCOUNTER — Encounter (HOSPITAL_COMMUNITY): Payer: Self-pay | Admitting: General Practice

## 2017-09-10 ENCOUNTER — Observation Stay (HOSPITAL_BASED_OUTPATIENT_CLINIC_OR_DEPARTMENT_OTHER): Payer: Medicare Other

## 2017-09-10 DIAGNOSIS — R079 Chest pain, unspecified: Secondary | ICD-10-CM | POA: Diagnosis not present

## 2017-09-10 DIAGNOSIS — E785 Hyperlipidemia, unspecified: Secondary | ICD-10-CM | POA: Diagnosis not present

## 2017-09-10 DIAGNOSIS — E876 Hypokalemia: Secondary | ICD-10-CM | POA: Diagnosis not present

## 2017-09-10 DIAGNOSIS — F419 Anxiety disorder, unspecified: Secondary | ICD-10-CM | POA: Diagnosis not present

## 2017-09-10 DIAGNOSIS — G8929 Other chronic pain: Secondary | ICD-10-CM | POA: Diagnosis not present

## 2017-09-10 DIAGNOSIS — I25110 Atherosclerotic heart disease of native coronary artery with unstable angina pectoris: Secondary | ICD-10-CM | POA: Diagnosis not present

## 2017-09-10 DIAGNOSIS — I252 Old myocardial infarction: Secondary | ICD-10-CM | POA: Diagnosis not present

## 2017-09-10 DIAGNOSIS — K449 Diaphragmatic hernia without obstruction or gangrene: Secondary | ICD-10-CM | POA: Diagnosis not present

## 2017-09-10 DIAGNOSIS — K219 Gastro-esophageal reflux disease without esophagitis: Secondary | ICD-10-CM | POA: Diagnosis not present

## 2017-09-10 DIAGNOSIS — N179 Acute kidney failure, unspecified: Secondary | ICD-10-CM | POA: Diagnosis not present

## 2017-09-10 DIAGNOSIS — I447 Left bundle-branch block, unspecified: Secondary | ICD-10-CM | POA: Diagnosis not present

## 2017-09-10 DIAGNOSIS — T82855A Stenosis of coronary artery stent, initial encounter: Secondary | ICD-10-CM | POA: Diagnosis not present

## 2017-09-10 DIAGNOSIS — I10 Essential (primary) hypertension: Secondary | ICD-10-CM | POA: Diagnosis not present

## 2017-09-10 LAB — ECHOCARDIOGRAM COMPLETE
Height: 64 in
Weight: 2208 oz

## 2017-09-10 LAB — BASIC METABOLIC PANEL
Anion gap: 4 — ABNORMAL LOW (ref 5–15)
BUN: 12 mg/dL (ref 8–23)
CALCIUM: 8.6 mg/dL — AB (ref 8.9–10.3)
CHLORIDE: 109 mmol/L (ref 98–111)
CO2: 32 mmol/L (ref 22–32)
CREATININE: 1.22 mg/dL (ref 0.61–1.24)
GFR calc Af Amer: >60 mL/min (ref >60)
GFR calc non Af Amer: 55 mL/min — ABNORMAL LOW (ref >60)
Glucose, Bld: 90 mg/dL (ref 70–99)
Potassium: 3.9 mmol/L (ref 3.5–5.1)
Sodium: 145 mmol/L (ref 135–145)

## 2017-09-10 LAB — TROPONIN I: Troponin I: 0.05 ng/mL (ref <0.03)

## 2017-09-10 LAB — NM MYOCAR MULTI W/SPECT W/WALL MOTION / EF
CSEPED: 4 min
CSEPEDS: 50 s
CSEPEW: 1 METS
CSEPHR: 52 %
MPHR: 143 {beats}/min
Peak HR: 75 {beats}/min
Rest HR: 55 {beats}/min

## 2017-09-10 MED ORDER — PREDNISONE 20 MG PO TABS
50.0000 mg | ORAL_TABLET | Freq: Four times a day (QID) | ORAL | Status: AC
  Administered 2017-09-10 – 2017-09-11 (×3): 50 mg via ORAL
  Filled 2017-09-10 (×3): qty 2

## 2017-09-10 MED ORDER — SODIUM CHLORIDE 0.9 % IV SOLN: 250.0000 mL | INTRAVENOUS | Status: DC | PRN

## 2017-09-10 MED ORDER — TECHNETIUM TC 99M TETROFOSMIN IV KIT
31.9000 | PACK | Freq: Once | INTRAVENOUS | Status: AC | PRN
  Administered 2017-09-10: 31.9 via INTRAVENOUS

## 2017-09-10 MED ORDER — DIPHENHYDRAMINE HCL 25 MG PO CAPS: 50.0000 mg | ORAL_CAPSULE | Freq: Once | ORAL | Status: DC

## 2017-09-10 MED ORDER — ASPIRIN 81 MG PO CHEW
81.0000 mg | CHEWABLE_TABLET | ORAL | Status: AC
  Administered 2017-09-11: 81 mg via ORAL
  Filled 2017-09-10: qty 1

## 2017-09-10 MED ORDER — DIPHENHYDRAMINE HCL 50 MG/ML IJ SOLN: 50.0000 mg | Freq: Once | INTRAMUSCULAR | Status: DC

## 2017-09-10 MED ORDER — NITROGLYCERIN 0.4 MG SL SUBL
SUBLINGUAL_TABLET | SUBLINGUAL | Status: AC
  Filled 2017-09-10: qty 1

## 2017-09-10 MED ORDER — REGADENOSON 0.4 MG/5ML IV SOLN
0.4000 mg | Freq: Once | INTRAVENOUS | Status: AC
  Administered 2017-09-10: 0.4 mg via INTRAVENOUS
  Filled 2017-09-10: qty 5

## 2017-09-10 MED ORDER — SODIUM CHLORIDE 0.9 % WEIGHT BASED INFUSION
1.0000 mL/kg/h | INTRAVENOUS | Status: DC
  Administered 2017-09-10: 1 mL/kg/h via INTRAVENOUS

## 2017-09-10 MED ORDER — SODIUM CHLORIDE 0.9% FLUSH: 3.0000 mL | INTRAVENOUS | Status: DC | PRN

## 2017-09-10 MED ORDER — SODIUM CHLORIDE 0.9% FLUSH
3.0000 mL | Freq: Two times a day (BID) | INTRAVENOUS | Status: DC
  Administered 2017-09-10: 3 mL via INTRAVENOUS

## 2017-09-10 MED ORDER — REGADENOSON 0.4 MG/5ML IV SOLN
INTRAVENOUS | Status: AC
  Filled 2017-09-10: qty 5

## 2017-09-10 MED ORDER — TECHNETIUM TC 99M TETROFOSMIN IV KIT
10.0000 | PACK | Freq: Once | INTRAVENOUS | Status: AC | PRN
  Administered 2017-09-10: 10 via INTRAVENOUS

## 2017-09-10 NOTE — ED Notes (Signed)
To nuclear medicine-- report also called to [address]. Will go to room after test.

## 2017-09-10 NOTE — H&P (Signed)
CARDIOLOGY H&P  HPI: [name] [name] is a 77 y.o. male w/ history of CAD (s/p CABG 2001 w/ multiple subsequent PCI), carotid disease, chronic chest pain, GERD, and HTN who presents w/ chest pain.   In brief, the patient has a long history of chest pain and coronary artery disease however he does not believe he has ever actually had an MI.  He reports escalating symptoms of chest pain over the past 2 weeks.  Prior to this he occasionally would get chest pain however he would take nitroglycerin and this would resolve.  Lately however his symptoms have been worsening, several times getting to the point that he was "doubled over" in pain.  He cannot identify any aggravating or alleviating factors for his pain.  He does however states that this feels similar to his prior pain that is led to him needing coronary intervention.  He thus came to the ED for further work-up.  In the ED, the patient was found to be mildly hypertensive with systolic blood pressures in the 160s.  Otherwise his vital signs were within normal limits.  His labs were all baseline and his initial troponin was negative at 0.03.  An ECG revealed his baseline left bundle branch block with repolarization abnormality.  He was admitted for further monitoring and treatment.  Review of Systems:     Cardiac Review of Systems: {Y] = yes [ ]  = no  Chest Pain [Y]  Resting SOB [   ] Exertional SOB  [  ]  Orthopnea [  ]   Pedal Edema [   ]    Palpitations [  ] Syncope  [  ]   Presyncope [   ]  General Review of Systems: [Y] = yes [  ]=no Constitional: recent weight change [  ]; anorexia [  ]; fatigue [  ]; nausea [  ]; night sweats [  ]; fever [  ]; or chills [  ];                                                                     Dental: poor dentition[  ];   Eye : blurred vision [  ]; diplopia [   ]; vision changes [  ];  Amaurosis fugax[  ]; Resp: cough [  ];  wheezing[  ];  hemoptysis[  ]; shortness of breath[  ]; paroxysmal nocturnal  dyspnea[  ]; dyspnea on exertion[  ]; or orthopnea[  ];  GI:  gallstones[  ], vomiting[  ];  dysphagia[  ]; melena[  ];  hematochezia [  ]; heartburn[  ];   GU: kidney stones [  ]; hematuria[  ];   dysuria [  ];  nocturia[  ];               Skin: rash [  ], swelling[  ];, hair loss[  ];  peripheral edema[  ];  or itching[  ]; Musculosketetal: myalgias[  ];  joint swelling[  ];  joint erythema[  ];  joint pain[  ];  back pain[  ];  Heme/Lymph: bruising[  ];  bleeding[  ];  anemia[  ];  Neuro: TIA[  ];  headaches[  ];  stroke[  ];  vertigo[  ];  seizures[  ];   paresthesias[  ];  difficulty walking[  ];  Psych:depression[  ]; anxiety[  ];  Endocrine: diabetes[  ];  thyroid dysfunction[  ];  Other:  Past Medical History:  Diagnosis Date  . Anemia 10/06/2011  . Anginal pain ([place])   . Arthritis    "joints hurt; shoulders, arms, back" (08/13/2013)  . Atrial fibrillation ([place])   . B12 deficiency   . Carotid stenosis 04/10/11   a. s/p left carotid endarterectomy 04/14/2011.;  b.  Carotid US (11/13):  L CEA ok; RICA 1-39%  . Chronic bronchitis ([place])    "get it q yr"  . Chronic chest pain   . Chronic lower back pain   . Colon polyp    adenomatous  . Coronary artery disease    a. S/p CABG in 2001. b. S/p DES to protected LM and BMS to RCA 2006. c. 12/2009: s/p DES to Cx;  d. 09/2011 Cath: patent stents, patent grafts -->Med Rx.;  e. CP with abnl Nuc => LHC 10/04/12: oLM 30%, dLM stent into the CFX patent, LAD occluded, LIMA-LAD patent, RI 30%, mid AVCFX 30%, oOM1 50%, oRCA stent patent, mRCA 50-60%, Radial graft-Dx patent; EF 55%.=> Med Rx  . Daily headache    "for the last couple weeks" (08/13/2013)  . Diverticulosis   . Esophageal stricture   . GERD (gastroesophageal reflux disease)   . Hemorrhoids   . Hiatal hernia   . Hyperlipidemia   . Hypertension   . Left bundle branch block   . Pancreatitis Dec 2011   ERCP ok.  . Prostate cancer [facility])    s/p cryoablation  . Renal cyst    Seen on CT  08/2011 also with circumferential bladder wall thickening  . Skin cancer    "cut it off my right ear"  . Statin intolerance     Prior to Admission medications   Medication Sig Start Date End Date Taking? Authorizing Provider  amLODipine (NORVASC) 10 MG tablet Take 10 mg by mouth daily.   Yes [provider]  aspirin EC 81 MG tablet Take 81 mg by mouth at bedtime.    Yes [provider]  cyanocobalamin 1000 MCG tablet Take 1,000 mcg by mouth at bedtime.    Yes [provider]  LORazepam (ATIVAN) 0.5 MG tablet Take 0.5 mg by mouth daily as needed for anxiety. 07/17/16  Yes [provider]  metoCLOPramide (REGLAN) 10 MG tablet Take 10 mg by mouth daily as needed for nausea.   Yes [provider]  metoprolol tartrate (LOPRESSOR) 25 MG tablet Take 25 mg by mouth 2 (two) times daily.   Yes [provider]  nitroGLYCERIN (NITROSTAT) 0.4 MG SL tablet Place 1 tablet (0.4 mg total) under the tongue every 5 (five) minutes as needed. For chest pain Patient taking differently: Place 0.4 mg under the tongue every 5 (five) minutes as needed for chest pain.  07/21/13  Yes Weaver, Scott T, PA-C  pantoprazole (PROTONIX) 40 MG tablet Take 40 mg by mouth 2 (two) times daily.    Yes [provider]  PARoxetine (PAXIL) 20 MG tablet Take 20 mg by mouth at bedtime.  04/18/17  Yes [provider]  ranitidine (ZANTAC) 150 MG tablet Take 150 mg by mouth 2 (two) times daily. 09/09/17  Yes [provider]  Simethicone 125 MG CAPS Take 1 capsule (125 mg total) by mouth daily as needed. Patient taking differently: Take 125 mg by  mouth daily as needed (gas).  05/16/17  Yes Lendon Colonel, NP  sucralfate (CARAFATE) 1 g tablet Take 1 g by mouth 2 (two) times daily as needed (stomach pain/ indigestion).  09/07/17  Yes [provider]  isosorbide mononitrate (IMDUR) 30 MG 24 hr tablet Take 1 tablet (30 mg total) by mouth daily. 08/20/17  11/18/17  Lendon Colonel, NP      Allergies  Allergen Reactions  . Shellfish Allergy Anaphylaxis  . Zolpidem Tartrate Other (See Comments)    hallucinations  . Amoxicillin-Pot Clavulanate Other (See Comments)    REACTION: 'Burns' Stomach  . Codeine Nausea And Vomiting  . Erythromycin Diarrhea, Nausea And Vomiting and Other (See Comments)    All mycins cause upset stomach  . Hydrocodone Nausea And Vomiting  . Morphine Nausea And Vomiting  . Nitrofuran Derivatives Other (See Comments)    Unknown reaction  . Oxycodone Hcl Nausea And Vomiting  . Tramadol Nausea And Vomiting    Social History   Socioeconomic History  . Marital status: Widowed    Spouse name: Not on file  . Number of children: 2  . Years of education: Not on file  . Highest education level: Not on file  Occupational History  . Occupation: Retired    Comment: raised Public librarian  . Financial resource strain: Not on file  . Food insecurity:    Worry: Not on file    Inability: Not on file  . Transportation needs:    Medical: Not on file    Non-medical: Not on file  Tobacco Use  . Smoking status: Former Smoker    Years: 0.10    Types: Cigarettes  . Smokeless tobacco: Current User    Types: Chew  . Tobacco comment: "smoked a few cigarettes; no more than 1 month"  Substance and Sexual Activity  . Alcohol use: No    Alcohol/week: 0.0 standard drinks    Comment: "no alcohol since I was a teenager"  . Drug use: No  . Sexual activity: Never  Lifestyle  . Physical activity:    Days per week: Not on file    Minutes per session: Not on file  . Stress: Not on file  Relationships  . Social connections:    Talks on phone: Not on file    Gets together: Not on file    Attends religious service: Not on file    Active member of club or organization: Not on file    Attends meetings of clubs or organizations: Not on file    Relationship status: Not on file  . Intimate partner violence:    Fear  of current or ex partner: Not on file    Emotionally abused: Not on file    Physically abused: Not on file    Forced sexual activity: Not on file  Other Topics Concern  . Not on file  Social History Narrative   Patient is illiterate. He cannot read or write. He left school at about the seventh grade.   As of 10/2015 he reports that his wife is chronically ill at home with heart disease and COPD and is under hospice care. The bulk of the care is given by the patient and daughter.    Family History  Problem Relation Age of Onset  . Heart disease Mother        Heart Disease before age 33  . Hypertension Mother   . Heart attack Mother   . Lung cancer Father   .  Hypertension Sister   . Heart disease Sister        Heart Disease before age 55  . Cancer Sister   . Heart attack Sister   . Hypertension Brother   . Heart disease Brother        Heart Disease before age 33  . Heart attack Brother   . Colon cancer Neg Hx     PHYSICAL EXAM: Vitals:   09/09/17 2245 09/09/17 2300  BP: 137/74 (!) 144/69  Pulse: (!) 52 (!) 52  Resp: 16 16  Temp:    SpO2: 99% 98%   General:  Well appearing. No respiratory difficulty HEENT: normal Neck: supple. no JVD. Carotids 2+ bilat; no bruits. No lymphadenopathy or thryomegaly appreciated. Cor: PMI nondisplaced. Regular rate & rhythm. No rubs, gallops or murmurs. Lungs: clear Abdomen: soft, nontender, nondistended. No hepatosplenomegaly. No bruits or masses. Good bowel sounds. Extremities: no cyanosis, clubbing, rash, edema Neuro: alert & oriented x 3, cranial nerves grossly intact. moves all 4 extremities w/o difficulty. Affect pleasant.  ECG: Wide left bundle branch block with repolarization abnormality.  Otherwise normal sinus rhythm.  Results for orders placed or performed during the hospital encounter of 09/09/17 (from the past 24 hour(s))  Basic metabolic panel     Status: Abnormal   Collection Time: 09/09/17  9:20 PM  Result Value Ref  Range   Sodium 135 135 - 145 mmol/L   Potassium 3.4 (L) 3.5 - 5.1 mmol/L   Chloride 104 98 - 111 mmol/L   CO2 25 22 - 32 mmol/L   Glucose, Bld 144 (H) 70 - 99 mg/dL   BUN 12 8 - 23 mg/dL   Creatinine, Ser 1.36 (H) 0.61 - 1.24 mg/dL   Calcium 8.1 (L) 8.9 - 10.3 mg/dL   GFR calc non Af Amer 49 (L) >60 mL/min   GFR calc Af Amer 56 (L) >60 mL/min   Anion gap 6 5 - 15  CBC     Status: None   Collection Time: 09/09/17  9:20 PM  Result Value Ref Range   WBC 5.9 4.0 - 10.5 K/uL   RBC 4.42 4.22 - 5.81 MIL/uL   Hemoglobin 13.3 13.0 - 17.0 g/dL   HCT 39.9 39.0 - 52.0 %   MCV 90.3 78.0 - 100.0 fL   MCH 30.1 26.0 - 34.0 pg   MCHC 33.3 30.0 - 36.0 g/dL   RDW 12.8 11.5 - 15.5 %   Platelets 190 150 - 400 K/uL  I-stat troponin, ED     Status: None   Collection Time: 09/09/17  9:33 PM  Result Value Ref Range   Troponin i, poc 0.03 0.00 - 0.08 ng/mL   Comment 3           Dg Chest 2 View  Result Date: 09/09/2017 CLINICAL DATA:  Chest pain for 2 weeks EXAM: CHEST - 2 VIEW COMPARISON:  09/08/2017, 09/04/2017, 04/20/2017 FINDINGS: Post sternotomy changes. No acute airspace disease or pleural effusion. Stable cardiomediastinal silhouette with aortic atherosclerosis. No pneumothorax. IMPRESSION: No active cardiopulmonary disease. Electronically Signed   By: Donavan Foil M.D.   On: 09/09/2017 21:57   ASSESSMENT: [name] [name] is a 77 y.o. male w/ history of CAD (s/p CABG 2001 w/ multiple subsequent PCI), carotid disease, chronic chest pain, GERD, and HTN who presents w/ chest pain.  Reasonable to assume this is ischemic heart pain given the patient's significant prior history of coronary disease.  Although it may be reasonable for him to undergo  angiography without further work-up, it seems as though stress testing to help quantify the extent and location of ischemia would be helpful in this patient with complex symptomatology and complex coronary and bypass graft anatomy.  PLAN/DISCUSSION: - repeat  troponin q6h x 2 - NPO for stress test in AM - ASA 324mg  then 81mg  daily - hold on heparin for now - continue home cardiac medications - SLN PRN - restart imdur 30mg  daily - may consider starting ranolazine as inpatient  Marcie Mowers, MD Cardiology Fellow, PGY-6

## 2017-09-10 NOTE — Progress Notes (Signed)
Pt states he feels better. PA at bedside. OK to continue with last set of  pictures

## 2017-09-10 NOTE — Progress Notes (Addendum)
Progress Note  Patient Name: [name] [name] Date of Encounter: 09/10/2017  Primary Cardiologist: [name]  Subjective   Seen in nuclear medicine for Lexiscan stress test. Pt had episode of 10/10 chest pain occurring after procedure. He was given 2 SL NTG with moderate resolution. No EKG changes. MD to evaluate for further ischemic workup   Inpatient Medications    Scheduled Meds: . amLODipine  10 mg Oral Daily  . aspirin EC  81 mg Oral QHS  . atorvastatin  40 mg Oral q1800  . famotidine  20 mg Oral BID  . heparin  5,000 Units Subcutaneous Q8H  . isosorbide mononitrate  30 mg Oral Daily  . metoprolol tartrate  25 mg Oral BID  . pantoprazole  40 mg Oral BID  . PARoxetine  20 mg Oral QHS  . regadenoson      . cyanocobalamin  1,000 mcg Oral QHS   Continuous Infusions:  PRN Meds: acetaminophen, LORazepam, metoCLOPramide, nitroGLYCERIN, ondansetron (ZOFRAN) IV, simethicone   Vital Signs    Vitals:   09/10/17 1100 09/10/17 1210 09/10/17 1211 09/10/17 1213  BP: (!) 163/77 132/69 122/64 122/64  Pulse:      Resp:      Temp:      TempSrc:      SpO2:      Weight:      Height:        Intake/Output Summary (Last 24 hours) at 09/10/2017 1216 Last data filed at 09/10/2017 0747 Gross per 24 hour  Intake -  Output 250 ml  Net -250 ml   Filed Weights   09/09/17 2114  Weight: 62.6 kg    Physical Exam   General: Elderly, NAD Skin: Warm, dry, intact  Head: Normocephalic, atraumatic, clear, moist mucus membranes. Neck: Negative for carotid bruits. No JVD Lungs:Clear to ausculation bilaterally. No wheezes, rales, or rhonchi. Breathing is unlabored. Cardiovascular: RRR with S1 S2. No murmurs, rubs, gallops, or LV heave appreciated. Abdomen: Soft, non-tender, non-distended with normoactive bowel sounds. No obvious abdominal masses. MSK: Strength and tone appear normal for age. 5/5 in all extremities Extremities: No edema. No clubbing or cyanosis. DP/PT pulses 2+  bilaterally Neuro: Alert and oriented. No focal deficits. No facial asymmetry. MAE spontaneously. Psych: Responds to questions appropriately with normal affect.    Labs    Chemistry Recent Labs  Lab 09/04/17 0913 09/09/17 2120 09/10/17 0549  NA 141 135 145  K 3.6 3.4* 3.9  CL 107 104 109  CO2 25 25 32  GLUCOSE 113* 144* 90  BUN 16 12 12   CREATININE 1.32* 1.36* 1.22  CALCIUM 8.7* 8.1* 8.6*  GFRNONAA 50* 49* 55*  GFRAA 58* 56* >60  ANIONGAP 9 6 4*     Hematology Recent Labs  Lab 09/04/17 0913 09/09/17 2120  WBC 6.4 5.9  RBC 4.68 4.42  HGB 14.3 13.3  HCT 42.4 39.9  MCV 90.6 90.3  MCH 30.6 30.1  MCHC 33.7 33.3  RDW 12.8 12.8  PLT 206 190    Cardiac Enzymes Recent Labs  Lab 09/10/17 0059  TROPONINI 0.05*    Recent Labs  Lab 09/04/17 0918 09/04/17 1317 09/09/17 2133  TROPIPOC 0.01 0.02 0.03     BNP Recent Labs  Lab 09/04/17 1309  BNP 153.8*    DDimer No results for input(s): DDIMER in the last 168 hours.   Radiology    Dg Chest 2 View  Result Date: 09/09/2017 CLINICAL DATA:  Chest pain for 2 weeks EXAM: CHEST -  2 VIEW COMPARISON:  09/08/2017, 09/04/2017, 04/20/2017 FINDINGS: Post sternotomy changes. No acute airspace disease or pleural effusion. Stable cardiomediastinal silhouette with aortic atherosclerosis. No pneumothorax. IMPRESSION: No active cardiopulmonary disease. Electronically Signed   By: Donavan Foil M.D.   On: 09/09/2017 21:57   Telemetry    NSR with RBBB - Personally Reviewed  ECG    09/10/17 NSR with diffuse T wave inversion and no acute ischemic changes, unchanged from prior tracings- Personally Reviewed  Cardiac Studies   Echocardiogram with pending results, 09/10/17  Patient Profile     77 y.o. male male w/ history of CAD (s/p CABG 2001 w/ multiple subsequent PCI), carotid disease, chronic chest pain, GERD, and HTN who presents w/ chest pain.  Assessment & Plan    1. Chest pain with elevated troponin and significant  hx of CAD: -EKG with diffuse T wave inversions, unchanged from prior tracings -Trop, 0.05>>continue to trend and monitor  -Seen in nuclear medicine>>with 10/10 chest pain after procedure. Pt given 2X SL NTG with moderate resolution>>no changes on EKG. -Given prior CAD hx and current symptoms, will likely need cardiac cath for definitive evaluation>>>remains NPO for now until MD evaluation  -Contine to trend trop levels -ASA, Imdur restarted    2. HTN: -Stable, 131/75>137/74>122/64 -Continue amlodipine, Imdur, metoprolol tartrate    Signed, Kathyrn Drown NP-C [facility] Pager: [phone] 09/10/2017, 12:16 PM     For questions or updates, please contact   Please consult [email or web address] for contact info under Cardiology/STEMI.

## 2017-09-10 NOTE — Progress Notes (Signed)
Very difficult situation.  Patient has had chronic chest pain intermittently for years.  He also has known disease.  He presented with prolonged chest pain and he has ruled out.  Nuclear study shows possible lateral ischemia.  We will plan to proceed with cardiac catheterization for definitive evaluation.  The risks and benefits including myocardial infarction, CVA and death discussed and he agrees to proceed.  We will premedicate for question dye allergy.  Kirk Ruths, MD

## 2017-09-10 NOTE — Progress Notes (Signed)
C/O neck pain and left arm pain. Nitroglycerin 0.4mg sl given

## 2017-09-10 NOTE — Progress Notes (Signed)
   [name] [name] presented for a nuclear stress test today.  No immediate complications.  Stress imaging is pending at this time.  Preliminary EKG findings may be listed in the chart, but the stress test result will not be finalized until perfusion imaging is complete.    Kathyrn Drown, NP-C 09/10/2017, 12:47 PM

## 2017-09-10 NOTE — ED Notes (Signed)
MD paged concerning pt's troponin 0.05, waiting for response

## 2017-09-10 NOTE — Progress Notes (Signed)
  Echocardiogram 2D Echocardiogram has been performed.  [name] [name] 09/10/2017, 2:41 PM

## 2017-09-10 NOTE — Progress Notes (Signed)
Chest pain 7/10 after nitroglycerin

## 2017-09-10 NOTE — Progress Notes (Signed)
Pt complaining of chest pain 10/10. Nitroglycerin sl given

## 2017-09-11 ENCOUNTER — Encounter (HOSPITAL_COMMUNITY): Payer: Self-pay | Admitting: General Practice

## 2017-09-11 ENCOUNTER — Encounter (HOSPITAL_COMMUNITY)
Admission: EM | Disposition: A | Discharge status: Home / Self Care | Payer: Self-pay | Source: Home / Self Care | Attending: Internal Medicine

## 2017-09-11 DIAGNOSIS — Z955 Presence of coronary angioplasty implant and graft: Secondary | ICD-10-CM | POA: Diagnosis not present

## 2017-09-11 DIAGNOSIS — I252 Old myocardial infarction: Secondary | ICD-10-CM | POA: Diagnosis not present

## 2017-09-11 DIAGNOSIS — G8929 Other chronic pain: Secondary | ICD-10-CM | POA: Diagnosis present

## 2017-09-11 DIAGNOSIS — E876 Hypokalemia: Secondary | ICD-10-CM | POA: Diagnosis present

## 2017-09-11 DIAGNOSIS — N179 Acute kidney failure, unspecified: Secondary | ICD-10-CM | POA: Diagnosis present

## 2017-09-11 DIAGNOSIS — I447 Left bundle-branch block, unspecified: Secondary | ICD-10-CM | POA: Diagnosis present

## 2017-09-11 DIAGNOSIS — Z87891 Personal history of nicotine dependence: Secondary | ICD-10-CM | POA: Diagnosis not present

## 2017-09-11 DIAGNOSIS — Z9049 Acquired absence of other specified parts of digestive tract: Secondary | ICD-10-CM | POA: Diagnosis not present

## 2017-09-11 DIAGNOSIS — I25110 Atherosclerotic heart disease of native coronary artery with unstable angina pectoris: Secondary | ICD-10-CM

## 2017-09-11 DIAGNOSIS — Z951 Presence of aortocoronary bypass graft: Secondary | ICD-10-CM | POA: Diagnosis not present

## 2017-09-11 DIAGNOSIS — T82855A Stenosis of coronary artery stent, initial encounter: Secondary | ICD-10-CM | POA: Diagnosis present

## 2017-09-11 DIAGNOSIS — F419 Anxiety disorder, unspecified: Secondary | ICD-10-CM | POA: Diagnosis present

## 2017-09-11 DIAGNOSIS — Z8546 Personal history of malignant neoplasm of prostate: Secondary | ICD-10-CM | POA: Diagnosis not present

## 2017-09-11 DIAGNOSIS — Z7982 Long term (current) use of aspirin: Secondary | ICD-10-CM | POA: Diagnosis not present

## 2017-09-11 DIAGNOSIS — Z801 Family history of malignant neoplasm of trachea, bronchus and lung: Secondary | ICD-10-CM | POA: Diagnosis not present

## 2017-09-11 DIAGNOSIS — E785 Hyperlipidemia, unspecified: Secondary | ICD-10-CM | POA: Diagnosis present

## 2017-09-11 DIAGNOSIS — K219 Gastro-esophageal reflux disease without esophagitis: Secondary | ICD-10-CM | POA: Diagnosis present

## 2017-09-11 DIAGNOSIS — Z86010 Personal history of colonic polyps: Secondary | ICD-10-CM | POA: Diagnosis not present

## 2017-09-11 DIAGNOSIS — Z95820 Peripheral vascular angioplasty status with implants and grafts: Secondary | ICD-10-CM | POA: Diagnosis not present

## 2017-09-11 DIAGNOSIS — Z8249 Family history of ischemic heart disease and other diseases of the circulatory system: Secondary | ICD-10-CM | POA: Diagnosis not present

## 2017-09-11 DIAGNOSIS — R072 Precordial pain: Secondary | ICD-10-CM | POA: Diagnosis present

## 2017-09-11 DIAGNOSIS — R7989 Other specified abnormal findings of blood chemistry: Secondary | ICD-10-CM | POA: Diagnosis present

## 2017-09-11 DIAGNOSIS — Z85828 Personal history of other malignant neoplasm of skin: Secondary | ICD-10-CM | POA: Diagnosis not present

## 2017-09-11 DIAGNOSIS — K449 Diaphragmatic hernia without obstruction or gangrene: Secondary | ICD-10-CM | POA: Diagnosis present

## 2017-09-11 DIAGNOSIS — Y838 Other surgical procedures as the cause of abnormal reaction of the patient, or of later complication, without mention of misadventure at the time of the procedure: Secondary | ICD-10-CM | POA: Diagnosis present

## 2017-09-11 DIAGNOSIS — I10 Essential (primary) hypertension: Secondary | ICD-10-CM | POA: Diagnosis present

## 2017-09-11 DIAGNOSIS — Z79899 Other long term (current) drug therapy: Secondary | ICD-10-CM | POA: Diagnosis not present

## 2017-09-11 HISTORY — PX: CORONARY STENT PLACEMENT: SHX1402

## 2017-09-11 HISTORY — PX: LEFT HEART CATH AND CORS/GRAFTS ANGIOGRAPHY: CATH118250

## 2017-09-11 HISTORY — PX: CORONARY STENT INTERVENTION: CATH118234

## 2017-09-11 LAB — BASIC METABOLIC PANEL
ANION GAP: 10 (ref 5–15)
BUN: 17 mg/dL (ref 8–23)
CO2: 24 mmol/L (ref 22–32)
Calcium: 9.4 mg/dL (ref 8.9–10.3)
Chloride: 106 mmol/L (ref 98–111)
Creatinine, Ser: 1.54 mg/dL — ABNORMAL HIGH (ref 0.61–1.24)
GFR calc non Af Amer: 42 mL/min — ABNORMAL LOW (ref >60)
GFR, EST AFRICAN AMERICAN: 48 mL/min — AB (ref >60)
GLUCOSE: 187 mg/dL — AB (ref 70–99)
POTASSIUM: 4.3 mmol/L (ref 3.5–5.1)
Sodium: 140 mmol/L (ref 135–145)

## 2017-09-11 LAB — CBC
HCT: 44.5 % (ref 39.0–52.0)
HEMOGLOBIN: 15.1 g/dL (ref 13.0–17.0)
MCH: 30.3 pg (ref 26.0–34.0)
MCHC: 33.9 g/dL (ref 30.0–36.0)
MCV: 89.4 fL (ref 78.0–100.0)
Platelets: 248 10*3/uL (ref 150–400)
RBC: 4.98 MIL/uL (ref 4.22–5.81)
RDW: 12.8 % (ref 11.5–15.5)
WBC: 6.6 10*3/uL (ref 4.0–10.5)

## 2017-09-11 LAB — POCT ACTIVATED CLOTTING TIME
Activated Clotting Time: 334 seconds
Activated Clotting Time: 180 seconds

## 2017-09-11 SURGERY — LEFT HEART CATH AND CORS/GRAFTS ANGIOGRAPHY
Anesthesia: LOCAL

## 2017-09-11 MED ORDER — SODIUM CHLORIDE 0.9 % IV SOLN
INTRAVENOUS | Status: AC
  Administered 2017-09-11: 14:00:00 via INTRAVENOUS

## 2017-09-11 MED ORDER — SODIUM CHLORIDE 0.9 % IV SOLN
1.7500 mg/kg/h | INTRAVENOUS | Status: AC
  Administered 2017-09-11: 1.75 mg/kg/h via INTRAVENOUS

## 2017-09-11 MED ORDER — IOPAMIDOL (ISOVUE-370) INJECTION 76%
INTRAVENOUS | Status: AC
  Filled 2017-09-11: qty 125

## 2017-09-11 MED ORDER — FENTANYL CITRATE (PF) 100 MCG/2ML IJ SOLN
INTRAMUSCULAR | Status: DC | PRN
  Administered 2017-09-11: 25 ug via INTRAVENOUS

## 2017-09-11 MED ORDER — TICAGRELOR 90 MG PO TABS
90.0000 mg | ORAL_TABLET | Freq: Two times a day (BID) | ORAL | Status: DC
  Administered 2017-09-11 – 2017-09-12 (×2): 90 mg via ORAL
  Filled 2017-09-11 (×2): qty 1

## 2017-09-11 MED ORDER — ANGIOPLASTY BOOK
Freq: Once | Status: AC
  Administered 2017-09-11: 22:00:00
  Filled 2017-09-11: qty 1

## 2017-09-11 MED ORDER — FENTANYL CITRATE (PF) 100 MCG/2ML IJ SOLN
INTRAMUSCULAR | Status: AC
  Filled 2017-09-11: qty 2

## 2017-09-11 MED ORDER — NITROGLYCERIN IN D5W 200-5 MCG/ML-% IV SOLN
2.0000 ug/min | INTRAVENOUS | Status: DC
  Administered 2017-09-11: 10 ug/min via INTRAVENOUS
  Filled 2017-09-11: qty 250

## 2017-09-11 MED ORDER — ONDANSETRON HCL 4 MG/2ML IJ SOLN: 4.0000 mg | Freq: Four times a day (QID) | INTRAMUSCULAR | Status: DC | PRN

## 2017-09-11 MED ORDER — HEPARIN (PORCINE) IN NACL 1000-0.9 UT/500ML-% IV SOLN
INTRAVENOUS | Status: DC | PRN
  Administered 2017-09-11 (×2): 500 mL

## 2017-09-11 MED ORDER — HYDRALAZINE HCL 20 MG/ML IJ SOLN: 5.0000 mg | INTRAMUSCULAR | Status: AC | PRN

## 2017-09-11 MED ORDER — BIVALIRUDIN TRIFLUOROACETATE 250 MG IV SOLR
INTRAVENOUS | Status: AC
  Filled 2017-09-11: qty 250

## 2017-09-11 MED ORDER — TICAGRELOR 90 MG PO TABS
ORAL_TABLET | ORAL | Status: DC | PRN
  Administered 2017-09-11: 180 mg via ORAL

## 2017-09-11 MED ORDER — LIDOCAINE HCL (PF) 1 % IJ SOLN
INTRAMUSCULAR | Status: AC
  Filled 2017-09-11: qty 30

## 2017-09-11 MED ORDER — IOPAMIDOL (ISOVUE-370) INJECTION 76%
INTRAVENOUS | Status: DC | PRN
  Administered 2017-09-11: 140 mL via INTRA_ARTERIAL

## 2017-09-11 MED ORDER — SODIUM CHLORIDE 0.9 % IV SOLN
INTRAVENOUS | Status: AC | PRN
  Administered 2017-09-11: 1.75 mg/kg/h via INTRAVENOUS

## 2017-09-11 MED ORDER — LIDOCAINE HCL (PF) 1 % IJ SOLN
INTRAMUSCULAR | Status: DC | PRN
  Administered 2017-09-11: 15 mL

## 2017-09-11 MED ORDER — ACETAMINOPHEN 325 MG PO TABS: 650.0000 mg | ORAL_TABLET | ORAL | Status: DC | PRN

## 2017-09-11 MED ORDER — LABETALOL HCL 5 MG/ML IV SOLN
10.0000 mg | INTRAVENOUS | Status: AC | PRN
  Administered 2017-09-11: 15:00:00 10 mg via INTRAVENOUS
  Filled 2017-09-11: qty 4

## 2017-09-11 MED ORDER — SODIUM CHLORIDE 0.9% FLUSH: 3.0000 mL | INTRAVENOUS | Status: DC | PRN

## 2017-09-11 MED ORDER — SODIUM CHLORIDE 0.9% FLUSH
3.0000 mL | Freq: Two times a day (BID) | INTRAVENOUS | Status: DC
  Administered 2017-09-11: 3 mL via INTRAVENOUS

## 2017-09-11 MED ORDER — ASPIRIN 81 MG PO CHEW: 81.0000 mg | CHEWABLE_TABLET | Freq: Every day | ORAL | Status: DC

## 2017-09-11 MED ORDER — HEPARIN (PORCINE) IN NACL 1000-0.9 UT/500ML-% IV SOLN
INTRAVENOUS | Status: AC
  Filled 2017-09-11: qty 1000

## 2017-09-11 MED ORDER — MIDAZOLAM HCL 2 MG/2ML IJ SOLN
INTRAMUSCULAR | Status: AC
  Filled 2017-09-11: qty 2

## 2017-09-11 MED ORDER — TICAGRELOR 90 MG PO TABS
ORAL_TABLET | ORAL | Status: AC
  Filled 2017-09-11: qty 2

## 2017-09-11 MED ORDER — SODIUM CHLORIDE 0.9 % IV SOLN: 250.0000 mL | INTRAVENOUS | Status: DC | PRN

## 2017-09-11 MED ORDER — BIVALIRUDIN BOLUS VIA INFUSION - CUPID
INTRAVENOUS | Status: DC | PRN
  Administered 2017-09-11: 46.425 mg via INTRAVENOUS

## 2017-09-11 MED ORDER — MIDAZOLAM HCL 2 MG/2ML IJ SOLN
INTRAMUSCULAR | Status: DC | PRN
  Administered 2017-09-11: 1 mg via INTRAVENOUS

## 2017-09-11 SURGICAL SUPPLY — 15 items
BALLN SAPPHIRE 2.0X12 (BALLOONS) ×2
BALLOON SAPPHIRE 2.0X12 (BALLOONS) IMPLANT
CATH INFINITI 5FR MULTPACK ANG (CATHETERS) ×1 IMPLANT
CATH VISTA GUIDE 6FR XBLAD3.5 (CATHETERS) ×2 IMPLANT
KIT ENCORE 26 ADVANTAGE (KITS) ×1 IMPLANT
KIT HEART LEFT (KITS) ×2 IMPLANT
PACK CARDIAC CATHETERIZATION (CUSTOM PROCEDURE TRAY) ×2 IMPLANT
SHEATH PINNACLE 5F 10CM (SHEATH) ×1 IMPLANT
SHEATH PINNACLE 6F 10CM (SHEATH) ×1 IMPLANT
STENT SYNERGY DES 2.5X12 (Permanent Stent) ×1 IMPLANT
TRANSDUCER W/STOPCOCK (MISCELLANEOUS) ×2 IMPLANT
TUBING CIL FLEX 10 FLL-RA (TUBING) ×2 IMPLANT
WIRE ASAHI PROWATER 180CM (WIRE) ×1 IMPLANT
WIRE EMERALD 3MM-J .035X150CM (WIRE) ×2 IMPLANT
WIRE HI TORQ VERSACORE-J 145CM (WIRE) ×1 IMPLANT

## 2017-09-11 NOTE — Progress Notes (Signed)
Patient c/o of CP stated that it was a 3-4/10.  Gave patient 3 doses of Nitroglycerine but pain did not go away.   EKG was done and showed no changes. Text MD regarding giving patient a GI cocktail but MD recommended that it could be muscular to give Tylenol for the pain.  I will keep monitoring patient.

## 2017-09-11 NOTE — Progress Notes (Signed)
30fr Sheath removed intact with no bleeding or hematoma noted. Manual pressure heldx 37min per Amy Terrell, RCIS. 4x4 gauze dressing applied to site. Post activity and precautions explained; verbalized understanding. Call bell w/in reach. RN informed. Will continue to monitor.Cindee Salt

## 2017-09-11 NOTE — Care Management (Signed)
Spoke to pt and dtr, Roma Schanz at bedside. Provided with Brilinta 30 day free trial card. Explained NCM will send for benefits check and follow up with retail pharmacy on 8/21.Jonnie Finner RN CCM Case Mgmt phone [phone]

## 2017-09-11 NOTE — Progress Notes (Signed)
Patient started on Nitro gtt due to continue CP at a 8/10.  Text MD on call and provider ordered gtt to be started.  At this time the patient's pain level has dramatically decrease and less than a 3.  Patient looks more comfortable and is not holding his chest any longer.  I will keep monitoring the patient.

## 2017-09-11 NOTE — Progress Notes (Signed)
Patient c/o of CP this morning 10/10.  Gave nitro x3 and pain came down to a 8/10.  Tylenol was given and he was placed on O2-4L.  Patient states pain still not relieved and has gone back up to 10/10.  Text MD, pending orders... I will keep monitoring patient.

## 2017-09-12 ENCOUNTER — Encounter (HOSPITAL_COMMUNITY): Payer: Self-pay | Admitting: Cardiology

## 2017-09-12 DIAGNOSIS — R072 Precordial pain: Secondary | ICD-10-CM

## 2017-09-12 DIAGNOSIS — E785 Hyperlipidemia, unspecified: Secondary | ICD-10-CM

## 2017-09-12 DIAGNOSIS — Z95820 Peripheral vascular angioplasty status with implants and grafts: Secondary | ICD-10-CM

## 2017-09-12 LAB — CBC
HEMATOCRIT: 36.7 % — AB (ref 39.0–52.0)
Hemoglobin: 12.5 g/dL — ABNORMAL LOW (ref 13.0–17.0)
MCH: 30.6 pg (ref 26.0–34.0)
MCHC: 34.1 g/dL (ref 30.0–36.0)
MCV: 89.7 fL (ref 78.0–100.0)
Platelets: 187 10*3/uL (ref 150–400)
RBC: 4.09 MIL/uL — ABNORMAL LOW (ref 4.22–5.81)
RDW: 13.2 % (ref 11.5–15.5)
WBC: 15.4 10*3/uL — ABNORMAL HIGH (ref 4.0–10.5)

## 2017-09-12 LAB — BASIC METABOLIC PANEL
Anion gap: 7 (ref 5–15)
BUN: 15 mg/dL (ref 8–23)
CHLORIDE: 109 mmol/L (ref 98–111)
CO2: 24 mmol/L (ref 22–32)
Calcium: 8.4 mg/dL — ABNORMAL LOW (ref 8.9–10.3)
Creatinine, Ser: 1.22 mg/dL (ref 0.61–1.24)
GFR calc Af Amer: >60 mL/min (ref >60)
GFR calc non Af Amer: 55 mL/min — ABNORMAL LOW (ref >60)
GLUCOSE: 139 mg/dL — AB (ref 70–99)
POTASSIUM: 3.4 mmol/L — AB (ref 3.5–5.1)
Sodium: 140 mmol/L (ref 135–145)

## 2017-09-12 MED ORDER — TICAGRELOR 90 MG PO TABS: 90.0000 mg | ORAL_TABLET | Freq: Two times a day (BID) | ORAL | 11 refills | Status: DC

## 2017-09-12 MED ORDER — PANTOPRAZOLE SODIUM 40 MG PO TBEC: 40.0000 mg | DELAYED_RELEASE_TABLET | Freq: Every day | ORAL | 1 refills | Status: DC

## 2017-09-12 MED ORDER — ATORVASTATIN CALCIUM 40 MG PO TABS: 40.0000 mg | ORAL_TABLET | Freq: Every day | ORAL | 3 refills | Status: DC

## 2017-09-12 MED ORDER — TICAGRELOR 90 MG PO TABS: 90.0000 mg | ORAL_TABLET | Freq: Two times a day (BID) | ORAL | 0 refills | Status: DC

## 2017-09-12 MED ORDER — POTASSIUM CHLORIDE CRYS ER 20 MEQ PO TBCR
40.0000 meq | EXTENDED_RELEASE_TABLET | Freq: Once | ORAL | Status: AC
  Administered 2017-09-12: 10:00:00 40 meq via ORAL
  Filled 2017-09-12: qty 2

## 2017-09-12 NOTE — Consult Note (Signed)
            [facility] CM Primary Care Navigator  09/12/2017  RENSO SWETT 1940-03-12 [id number]   Went to seepatient at the bedside to identify possible discharge needs buthe wasalready dischargedhomeper staff. (referral to [place] Phase 2 cardiac rehab)  Per MD note,patient wasadmitted with chest pain (coronary artery disease status post angioplasty with stent).  Patient has discharge instruction to follow-up withcardiology on 10/01/17  Primary care provider's office is listed as providing transition of care (TOC) follow-up.   For additional questions please contact:  Edwena Felty A. Latunya Kissick, BSN, RN-BC [facility] PRIMARY CARE Navigator Cell: [phone]

## 2017-09-12 NOTE — Care Management (Signed)
09-12-17  BENEFITS CHECK :  # 3.  S/W JAZMANIE  @   [employer] # [phone]  BRILINTA  90 MG BID COVER- YES CO-PAY- $ 47.00   Q/L TWO PILL PER DAY TIER- 3 DRUG PRIOR APPROVAL- NO  PREFERRED PHARMACY :  YES     [facility]

## 2017-09-12 NOTE — Discharge Summary (Addendum)
Discharge Summary    Patient ID: [name] [name],  MRN: [id number], DOB/AGE: 1940-06-29 77 y.o.  Admit date: 09/09/2017 Discharge date: 09/12/2017  Primary Care Provider: Cyndi Bender Primary Cardiologist: Kirk Ruths, MD  Discharge Diagnoses    Principal Problem:   Chest pain Active Problems:   Essential (primary) hypertension   AKI (acute kidney injury) ([place])   Unstable angina ([place])   S/P angioplasty with stent   Hyperlipidemia LDL goal <70   Allergies Allergies  Allergen Reactions  . Shellfish Allergy Anaphylaxis  . Zolpidem Tartrate Other (See Comments)    hallucinations  . Amoxicillin-Pot Clavulanate Other (See Comments)    REACTION: 'Burns' Stomach  . Codeine Nausea And Vomiting  . Erythromycin Diarrhea, Nausea And Vomiting and Other (See Comments)    All mycins cause upset stomach  . Hydrocodone Nausea And Vomiting  . Morphine Nausea And Vomiting  . Nitrofuran Derivatives Other (See Comments)    Unknown reaction  . Oxycodone Hcl Nausea And Vomiting  . Tramadol Nausea And Vomiting    Diagnostic Studies/Procedures    Left Heart catheterization 09/11/2017 Diagnostic  Dominance: Right  Left Main  Ost LM to Dist LM lesion 95% stenosed  Ost LM to Dist LM lesion is 95% stenosed. The lesion was previously treated.  Left Anterior Descending  Ost LAD to Prox LAD lesion 100% stenosed  Ost LAD to Prox LAD lesion is 100% stenosed.  Left Circumflex  Ost Cx to Prox Cx lesion 95% stenosed  Ost Cx to Prox Cx lesion is 95% stenosed. The lesion was previously treated.  Right Coronary Artery  Ost RCA to Prox RCA lesion 0% stenosed  Previously placed Ost RCA to Prox RCA stent (unknown type) is widely patent.  Mid RCA lesion 60% stenosed  Mid RCA lesion is 60% stenosed.  LIMA Graft to Mid LAD  Graft to Ramus  Intervention   Ost LM to Dist LM lesion  Stent  A stent was successfully placed.  Post-Intervention Lesion Assessment  There is a 0% residual stenosis  post intervention.  Ost Cx to Prox Cx lesion  Stent  A stent was successfully placed.  Post-Intervention Lesion Assessment  There is a 0% residual stenosis post intervention.  Coronary Diagrams   Diagnostic Diagram       Post-Intervention Diagram       _____________  Echocardiogram 09/10/17 Study Conclusions  - Left ventricle: The cavity size was normal. There was moderate   concentric hypertrophy. Systolic function was normal. The   estimated ejection fraction was in the range of 60% to 65%. Wall   motion was normal; there were no regional wall motion   abnormalities. Features are consistent with a pseudonormal left   ventricular filling pattern, with concomitant abnormal relaxation   and increased filling pressure (grade 2 diastolic dysfunction). - Aortic valve: Trileaflet; mildly thickened, mildly calcified   leaflets. There was trivial regurgitation. - Mitral valve: There was trivial regurgitation. - Left atrium: The atrium was mildly dilated. - Tricuspid valve: There was trivial regurgitation. - Pulmonary arteries: PA peak pressure: 32 mm Hg (S). _______________________________  Nuclear stress test 09/10/17  IMPRESSION: 1. Possible new small area of inducible ischemia in the mid lateral wall. Otherwise stable perfusion study. 2. Progressive global hypokinesis with asymmetric involvement of the septum and inferior wall. 3. Left ventricular ejection fraction 43% 4. Non invasive risk stratification*: Moderate   History of Present Illness     [name] [name] is a 77 y.o. male w/  history of CAD (s/p CABG 2001 w/ multiple subsequent PCI), carotid disease, chronic chest pain, GERD, and HTN who presented w/ chest pain.   In brief, the patient has a long history of chest pain and coronary artery disease, however, he does not believe he has ever actually had an MI.  He reported escalating symptoms of chest pain over the past 2 weeks.  Prior to this he occasionally would  get chest pain however he would take nitroglycerin and this would resolve. More recently his symptoms have been worsening, several times getting to the point that he was "doubled over" in pain.  He could not identify any aggravating or alleviating factors for his pain.  He did state that this felt similar to his prior pain that is led to him needing coronary intervention.  He thus came to the ED for further work-up.  Hospital Course     Consultants: none  Troponins were initially negative and then 0.05. EKG was without acute changes. An echo on 09/10/17 showed moderate LVH, ef 60-65%, no regional wall motion abnormalities and grade 2 diastolic dysfunction. A nuclear stress test on 09/10/17 showed possible small area of inducible ischemia in the mid lateral wall. Otherwise stable perfusion study with EF 43% and was considered moderate risk.   He underwent cardiac cath on 09/11/17 that revealed patent LIMA to LAD and SVG to Ramus. There was severe stenosis of the left main (previously treated with bypass)  and proximal circ. 2 drug eluting stents were placed to obtain flow to the circumflex artery. The patient was observed overnight and has done well without complications. He says that his chest pain "is 100% better". Right groin cath site is stable.   The patient has been started on Ticagrelor 90 mg bid and aspirin 81 mg daily. High intensity statin has been initiated with atorvastatin 40 mg daily.   He has been seen by cardiac rehab this morning and walked in the hall without any chest pain.   His blood pressure was elevated in the morning. Continue home meds and follow up in the office.   Renal function was mildly impaired with SCr 1.54 but improved to 1.22 today. Hgb was 12.5 on morning of discharge, decreased from 15.1, but was 13.3 at admission.   _____________  Discharge Vitals Blood pressure (!) 159/70, pulse 63, temperature (!) 97.5 F (36.4 C), temperature source Oral, resp. rate 15,  height 5\' 4"  (1.626 m), weight 62.4 kg, SpO2 100 %.  Filed Weights   09/09/17 2114 09/11/17 0500 09/12/17 0502  Weight: 62.6 kg 61.9 kg 62.4 kg   Physical Exam  Constitutional: He is oriented to person, place, and time. He appears well-developed and well-nourished.  HENT:  Head: Normocephalic and atraumatic.  Neck: Normal range of motion. Neck supple. No JVD present.  Cardiovascular: Normal rate, regular rhythm, normal heart sounds and intact distal pulses. Exam reveals no gallop and no friction rub.  No murmur heard. Pulmonary/Chest: Effort normal and breath sounds normal. No respiratory distress. He has no wheezes. He has no rales.  Abdominal: Soft. Bowel sounds are normal.  Musculoskeletal: Normal range of motion. He exhibits no edema or deformity.  Neurological: He is alert and oriented to person, place, and time.  Skin: Skin is warm and dry.  Psychiatric: He has a normal mood and affect. His behavior is normal. Thought content normal.  Vitals reviewed.  Right groin cath site is stable without hematoma, tenderness or bruit.   Labs & Radiologic Studies  CBC Recent Labs    09/11/17 0450 09/12/17 0223  WBC 6.6 15.4*  HGB 15.1 12.5*  HCT 44.5 36.7*  MCV 89.4 89.7  PLT 248 696   Basic Metabolic Panel Recent Labs    09/11/17 0450 09/12/17 0223  NA 140 140  K 4.3 3.4*  CL 106 109  CO2 24 24  GLUCOSE 187* 139*  BUN 17 15  CREATININE 1.54* 1.22  CALCIUM 9.4 8.4*   Liver Function Tests No results for input(s): AST, ALT, ALKPHOS, BILITOT, PROT, ALBUMIN in the last 72 hours. No results for input(s): LIPASE, AMYLASE in the last 72 hours. Cardiac Enzymes Recent Labs    09/10/17 0059  TROPONINI 0.05*   BNP Invalid input(s): POCBNP D-Dimer No results for input(s): DDIMER in the last 72 hours. Hemoglobin A1C No results for input(s): HGBA1C in the last 72 hours. Fasting Lipid Panel No results for input(s): CHOL, HDL, LDLCALC, TRIG, CHOLHDL, LDLDIRECT in the last  72 hours. Thyroid Function Tests No results for input(s): TSH, T4TOTAL, T3FREE, THYROIDAB in the last 72 hours.  Invalid input(s): FREET3 _____________  Dg Chest 2 View  Result Date: 09/09/2017 CLINICAL DATA:  Chest pain for 2 weeks EXAM: CHEST - 2 VIEW COMPARISON:  09/08/2017, 09/04/2017, 04/20/2017 FINDINGS: Post sternotomy changes. No acute airspace disease or pleural effusion. Stable cardiomediastinal silhouette with aortic atherosclerosis. No pneumothorax. IMPRESSION: No active cardiopulmonary disease. Electronically Signed   By: Donavan Foil M.D.   On: 09/09/2017 21:57   Dg Chest 2 View  Result Date: 09/04/2017 CLINICAL DATA:  Chest pain. EXAM: CHEST - 2 VIEW COMPARISON:  Radiographs of April 20, 2017. FINDINGS: Stable cardiomediastinal silhouette. Status post coronary artery bypass graft. Stable left hilar surgical clips. No pneumothorax or pleural effusion is noted. Both lungs are clear. The visualized skeletal structures are unremarkable. IMPRESSION: No active cardiopulmonary disease. Electronically Signed   By: Marijo Conception, M.D.   On: 09/04/2017 09:34   Nm Myocar Multi W/spect W/wall Motion / Ef  Result Date: 09/10/2017 CLINICAL DATA:  Chest pain/discomfort. History of atrial fibrillation and CABG. EXAM: MYOCARDIAL IMAGING WITH SPECT (REST AND EXERCISE) GATED LEFT VENTRICULAR WALL MOTION STUDY LEFT VENTRICULAR EJECTION FRACTION TECHNIQUE: Standard myocardial SPECT imaging was performed after resting intravenous injection of 10 mCi Tc-32m tetrofosmin. Subsequently, exercise tolerance test was performed by the patient under the supervision of the Cardiology staff. At peak-stress, 30 mCi Tc-2m tetrofosmin was injected intravenously and standard myocardial SPECT imaging was performed. Quantitative gated imaging was also performed to evaluate left ventricular wall motion, and estimate left ventricular ejection fraction. COMPARISON:  Radiographs 09/09/2017. Nuclear medicine myocardial  perfusion study 08/14/2013. FINDINGS: Perfusion: Stable small fixed perfusion defect at the left ventricular apex. There is a questionable new small reversible perfusion defect in the mid lateral wall (summed difference score 5). Wall Motion: New global hypokinesis with asymmetric involvement of the septum and inferior wall. No significant ventricular dilatation. Left Ventricular Ejection Fraction: 43 %.  Previously 60%. End diastolic volume 88 ml End systolic volume 50 ml IMPRESSION: 1. Possible new small area of inducible ischemia in the mid lateral wall. Otherwise stable perfusion study. 2. Progressive global hypokinesis with asymmetric involvement of the septum and inferior wall. 3. Left ventricular ejection fraction 43% 4. Non invasive risk stratification*: Moderate *2012 Appropriate Use Criteria for Coronary Revascularization Focused Update: J Am Coll Cardiol. 2952;84(1):324-401. http://content.airportbarriers.com.aspx?articleid=1201161 Electronically Signed   By: Richardean Sale M.D.   On: 09/10/2017 15:44   Disposition   Pt is being discharged  home today in good condition.  Follow-up Plans & Appointments    Follow-up Information    Erlene Quan, PA-C Follow up.   Specialties:  Cardiology, Radiology Why:  Cardiology hospital follow up on September 9th at 10:30. Please arrive 15 minutes early for check in.  Contact information: [address] [place] [address] [phone]        Lelon Perla, MD Follow up.   Specialty:  Cardiology Why:  3 month cardiology follow up on November 11th at 3:00. Please arrive 15 minutes early for check in.  Contact information: [address] [place] [address] [phone]          Discharge Instructions    AMB Referral to Cardiac Rehabilitation - Phase II   Complete by:  As directed    Diagnosis:  Coronary Stents   Amb Referral to Cardiac Rehabilitation   Complete by:  As directed    Referring to  [place] Phase 2   Diagnosis:  Coronary Stents   Diet - low sodium heart healthy   Complete by:  As directed    Increase activity slowly   Complete by:  As directed       Discharge Medications   Allergies as of 09/12/2017      Reactions   Shellfish Allergy Anaphylaxis   Zolpidem Tartrate Other (See Comments)   hallucinations   Amoxicillin-pot Clavulanate Other (See Comments)   REACTION: 'Burns' Stomach   Codeine Nausea And Vomiting   Erythromycin Diarrhea, Nausea And Vomiting, Other (See Comments)   All mycins cause upset stomach   Hydrocodone Nausea And Vomiting   Morphine Nausea And Vomiting   Nitrofuran Derivatives Other (See Comments)   Unknown reaction   Oxycodone Hcl Nausea And Vomiting   Tramadol Nausea And Vomiting      Medication List    STOP taking these medications   isosorbide mononitrate 30 MG 24 hr tablet Commonly known as:  IMDUR   sucralfate 1 g tablet Commonly known as:  CARAFATE     TAKE these medications   amLODipine 10 MG tablet Commonly known as:  NORVASC Take 10 mg by mouth daily.   aspirin EC 81 MG tablet Take 81 mg by mouth at bedtime.   atorvastatin 40 MG tablet Commonly known as:  LIPITOR Take 1 tablet (40 mg total) by mouth daily at 6 PM.   cyanocobalamin 1000 MCG tablet Take 1,000 mcg by mouth at bedtime.   LORazepam 0.5 MG tablet Commonly known as:  ATIVAN Take 0.5 mg by mouth daily as needed for anxiety.   metoCLOPramide 10 MG tablet Commonly known as:  REGLAN Take 10 mg by mouth daily as needed for nausea.   metoprolol tartrate 25 MG tablet Commonly known as:  LOPRESSOR Take 25 mg by mouth 2 (two) times daily.   nitroGLYCERIN 0.4 MG SL tablet Commonly known as:  NITROSTAT Place 1 tablet (0.4 mg total) under the tongue every 5 (five) minutes as needed. For chest pain What changed:    reasons to take this  additional instructions   pantoprazole 40 MG tablet Commonly known as:  PROTONIX Take 1 tablet (40 mg total)  by mouth daily. What changed:  when to take this   PARoxetine 20 MG tablet Commonly known as:  PAXIL Take 20 mg by mouth at bedtime.   ranitidine 150 MG tablet Commonly known as:  ZANTAC Take 150 mg by mouth 2 (two) times daily.   Simethicone 125 MG Caps  Take 1 capsule (125 mg total) by mouth daily as needed. What changed:  reasons to take this   ticagrelor 90 MG Tabs tablet Commonly known as:  BRILINTA Take 1 tablet (90 mg total) by mouth 2 (two) times daily.        Acute coronary syndrome (MI, NSTEMI, STEMI, etc) this admission?: Yes.     AHA/ACC Clinical Performance & Quality Measures: 1. Aspirin prescribed? - Yes 2. ADP Receptor Inhibitor (Plavix/Clopidogrel, Brilinta/Ticagrelor or Effient/Prasugrel) prescribed (includes medically managed patients)? - Yes 3. Beta Blocker prescribed? - Yes 4. High Intensity Statin (Lipitor 40-80mg  or Crestor 20-40mg ) prescribed? - Yes 5. EF assessed during THIS hospitalization? - Yes 6. For EF <40%, was ACEI/ARB prescribed? - Not Applicable (EF >/= 53%) 7. For EF <40%, Aldosterone Antagonist (Spironolactone or Eplerenone) prescribed? - Not Applicable (EF >/= 01%) 8. Cardiac Rehab Phase II ordered (Included Medically managed Patients)? - Yes     Outstanding Labs/Studies   BMet at follow up  Duration of Discharge Encounter   Greater than 30 minutes including physician time.  Signed, Daune Perch, NP 09/12/2017, 9:40 AM

## 2017-09-12 NOTE — Progress Notes (Signed)
Progress Note  Patient Name: [name] [name] Date of Encounter: 09/12/2017  Primary Cardiologist: Kirk Ruths, MD   Subjective   No chest pain or dyspnea  Inpatient Medications    Scheduled Meds: . amLODipine  10 mg Oral Daily  . aspirin EC  81 mg Oral QHS  . atorvastatin  40 mg Oral q1800  . heparin  5,000 Units Subcutaneous Q8H  . metoprolol tartrate  25 mg Oral BID  . pantoprazole  40 mg Oral BID  . PARoxetine  20 mg Oral QHS  . sodium chloride flush  3 mL Intravenous Q12H  . ticagrelor  90 mg Oral BID  . cyanocobalamin  1,000 mcg Oral QHS   Continuous Infusions: . sodium chloride    . nitroGLYCERIN Stopped (09/12/17 0230)   PRN Meds: sodium chloride, acetaminophen, LORazepam, metoCLOPramide, nitroGLYCERIN, ondansetron (ZOFRAN) IV, simethicone, sodium chloride flush   Vital Signs    Vitals:   09/12/17 0700 09/12/17 0712 09/12/17 0800 09/12/17 0900  BP:  (!) 159/70    Pulse:  63    Resp: 16 19 16 15   Temp:  (!) 97.5 F (36.4 C)    TempSrc:  Oral    SpO2:  100%    Weight:      Height:        Intake/Output Summary (Last 24 hours) at 09/12/2017 0929 Last data filed at 09/12/2017 0700 Gross per 24 hour  Intake 1677.1 ml  Output 825 ml  Net 852.1 ml   Filed Weights   09/09/17 2114 09/11/17 0500 09/12/17 0502  Weight: 62.6 kg 61.9 kg 62.4 kg    Telemetry    Sinus - Personally Reviewed   Physical Exam   GEN: No acute distress.   Neck: No JVD Cardiac: RRR, no murmurs, rubs, or gallops.  Respiratory: Clear to auscultation bilaterally. GI: Soft, nontender, non-distended  MS: No edema; Radial cath site with no hematoma Neuro:  Nonfocal  Psych: Normal affect   Labs    Chemistry Recent Labs  Lab 09/10/17 0549 09/11/17 0450 09/12/17 0223  NA 145 140 140  K 3.9 4.3 3.4*  CL 109 106 109  CO2 32 24 24  GLUCOSE 90 187* 139*  BUN 12 17 15   CREATININE 1.22 1.54* 1.22  CALCIUM 8.6* 9.4 8.4*  GFRNONAA 55* 42* 55*  GFRAA >60 48* >60  ANIONGAP  4* 10 7     Hematology Recent Labs  Lab 09/09/17 2120 09/11/17 0450 09/12/17 0223  WBC 5.9 6.6 15.4*  RBC 4.42 4.98 4.09*  HGB 13.3 15.1 12.5*  HCT 39.9 44.5 36.7*  MCV 90.3 89.4 89.7  MCH 30.1 30.3 30.6  MCHC 33.3 33.9 34.1  RDW 12.8 12.8 13.2  PLT 190 248 187    Cardiac Enzymes Recent Labs  Lab 09/10/17 0059  TROPONINI 0.05*    Recent Labs  Lab 09/09/17 2133  TROPIPOC 0.03     Radiology    Nm Myocar Multi W/spect W/wall Motion / Ef  Result Date: 09/10/2017 CLINICAL DATA:  Chest pain/discomfort. History of atrial fibrillation and CABG. EXAM: MYOCARDIAL IMAGING WITH SPECT (REST AND EXERCISE) GATED LEFT VENTRICULAR WALL MOTION STUDY LEFT VENTRICULAR EJECTION FRACTION TECHNIQUE: Standard myocardial SPECT imaging was performed after resting intravenous injection of 10 mCi Tc-47m tetrofosmin. Subsequently, exercise tolerance test was performed by the patient under the supervision of the Cardiology staff. At peak-stress, 30 mCi Tc-41m tetrofosmin was injected intravenously and standard myocardial SPECT imaging was performed. Quantitative gated imaging was also performed to evaluate  left ventricular wall motion, and estimate left ventricular ejection fraction. COMPARISON:  Radiographs 09/09/2017. Nuclear medicine myocardial perfusion study 08/14/2013. FINDINGS: Perfusion: Stable small fixed perfusion defect at the left ventricular apex. There is a questionable new small reversible perfusion defect in the mid lateral wall (summed difference score 5). Wall Motion: New global hypokinesis with asymmetric involvement of the septum and inferior wall. No significant ventricular dilatation. Left Ventricular Ejection Fraction: 43 %.  Previously 60%. End diastolic volume 88 ml End systolic volume 50 ml IMPRESSION: 1. Possible new small area of inducible ischemia in the mid lateral wall. Otherwise stable perfusion study. 2. Progressive global hypokinesis with asymmetric involvement of the septum  and inferior wall. 3. Left ventricular ejection fraction 43% 4. Non invasive risk stratification*: Moderate *2012 Appropriate Use Criteria for Coronary Revascularization Focused Update: J Am Coll Cardiol. 7494;49(6):759-163. http://content.airportbarriers.com.aspx?articleid=[email or web address] Electronically Signed   By: Richardean Sale M.D.   On: 09/10/2017 15:44     Patient Profile     77 y.o. male with past medical history of coronary artery disease and chronic chest pain admitted with chest pain.  Patient ruled out.  Assessment & Plan    1 coronary artery disease-patient underwent PCI of left main yesterday.  He denies chest pain this morning.  Plan to continue aspirin, Brilinta and statin.  2 hypertension-blood pressure appears to be controlled this morning.  Continue present medications at discharge and follow.  3 hyperlipidemia-continue statin.  4 hypokalemia-supplement.  Plan discharge today.  Transition of care appointment 1 week.  Follow-up with me in 3 months. Greater than 30 minutes PA and physician time. D2  For questions or updates, please contact [place] Please consult [email or web address] for contact info under Cardiology/STEMI.      Signed, Kirk Ruths, MD  09/12/2017, 9:29 AM

## 2017-09-12 NOTE — Progress Notes (Signed)
CARDIAC REHAB PHASE I   PRE:  Rate/Rhythm: 75 SR  BP:  Supine: 174/57 Sitting:   Standing:    SaO2:   MODE:  Ambulation: 800 ft   POST:  Rate/Rhythm: 89 SR  BP:  Supine: 195/65      After ed 169/59  Sitting:   Standing:    SaO2: 99%RA 0808-0900 Pt walked 800 ft on RA with steady gait and no CP.  Tolerated well. Education completed with pt verbally and left written material for daughter so she can reinforce. Pt does not read. Wrote down info at his request. Stressed importance of brilinta and reviewed several times with pt. Has brilinta card. Reviewed NTG use, ex ed and discussed healthy food choices. Pt lives alone and does his own cooking. Discussed not using salt with elevated BP,. Discussed CRP 2 and referred to [facility] program. Pt does not think he will attend due to not driving much. Notified RN of elevated BP.   Graylon Good, RN BSN  09/12/2017 8:54 AM

## 2017-09-12 NOTE — Progress Notes (Signed)
Dc instructions given to patient and daughter at this time.  Verbalized understanding.  Pt has no s/s of any acute distress noted.  Denies pain.

## 2017-09-12 NOTE — Care Management Note (Signed)
Case Management Note  Patient Details  Name: [name] [name] MRN: [id number] Date of Birth: November 30, 1940  Subjective/Objective:   From home, pta indep, s/p stent intervention, will be on brilinta, he has 30 day coupon ,  His refills will be 47.00 per pharmacist at [facility] on [address], they do have the brilinta in stock.                   Action/Plan: DC home when ready.   Expected Discharge Date:  09/12/17               Expected Discharge Plan:  Home/Self Care  In-House Referral:     Discharge planning Services  CM Consult, Medication Assistance  Post Acute Care Choice:    Choice offered to:     DME Arranged:    DME Agency:     HH Arranged:    HH Agency:     Status of Service:  Completed, signed off  If discussed at [employer] of Stay Meetings, dates discussed:    Additional Comments:  Zenon Mayo, RN 09/12/2017, 9:59 AM

## 2017-09-13 ENCOUNTER — Other Ambulatory Visit: Payer: Self-pay

## 2017-09-13 DIAGNOSIS — I2510 Atherosclerotic heart disease of native coronary artery without angina pectoris: Secondary | ICD-10-CM

## 2017-09-13 MED ORDER — NITROGLYCERIN 0.4 MG SL SUBL: 0.4000 mg | SUBLINGUAL_TABLET | SUBLINGUAL | 11 refills | Status: AC | PRN

## 2017-09-27 ENCOUNTER — Emergency Department (HOSPITAL_COMMUNITY)
Admission: EM | Admit: 2017-09-27 | Discharge: 2017-09-27 | Disposition: A | Discharge status: Home / Self Care | Payer: Medicare Other | Attending: Emergency Medicine | Admitting: Emergency Medicine

## 2017-09-27 ENCOUNTER — Emergency Department (HOSPITAL_COMMUNITY): Payer: Medicare Other

## 2017-09-27 ENCOUNTER — Other Ambulatory Visit: Payer: Self-pay

## 2017-09-27 DIAGNOSIS — Z85828 Personal history of other malignant neoplasm of skin: Secondary | ICD-10-CM | POA: Diagnosis not present

## 2017-09-27 DIAGNOSIS — Z8546 Personal history of malignant neoplasm of prostate: Secondary | ICD-10-CM | POA: Insufficient documentation

## 2017-09-27 DIAGNOSIS — I2510 Atherosclerotic heart disease of native coronary artery without angina pectoris: Secondary | ICD-10-CM | POA: Diagnosis not present

## 2017-09-27 DIAGNOSIS — R51 Headache: Secondary | ICD-10-CM | POA: Diagnosis not present

## 2017-09-27 DIAGNOSIS — I10 Essential (primary) hypertension: Secondary | ICD-10-CM | POA: Insufficient documentation

## 2017-09-27 DIAGNOSIS — Z951 Presence of aortocoronary bypass graft: Secondary | ICD-10-CM | POA: Insufficient documentation

## 2017-09-27 DIAGNOSIS — R0602 Shortness of breath: Secondary | ICD-10-CM | POA: Diagnosis not present

## 2017-09-27 DIAGNOSIS — R072 Precordial pain: Secondary | ICD-10-CM | POA: Insufficient documentation

## 2017-09-27 DIAGNOSIS — R079 Chest pain, unspecified: Secondary | ICD-10-CM | POA: Diagnosis present

## 2017-09-27 DIAGNOSIS — R0789 Other chest pain: Secondary | ICD-10-CM | POA: Diagnosis not present

## 2017-09-27 DIAGNOSIS — R519 Headache, unspecified: Secondary | ICD-10-CM

## 2017-09-27 DIAGNOSIS — R4020 Unspecified coma: Secondary | ICD-10-CM | POA: Diagnosis not present

## 2017-09-27 LAB — URINALYSIS, ROUTINE W REFLEX MICROSCOPIC
Bacteria, UA: NONE SEEN
Bilirubin Urine: NEGATIVE
Glucose, UA: NEGATIVE mg/dL
Ketones, ur: NEGATIVE mg/dL
Leukocytes, UA: NEGATIVE
Nitrite: NEGATIVE
Protein, ur: NEGATIVE mg/dL
SPECIFIC GRAVITY, URINE: 1.021 (ref 1.005–1.030)
pH: 5 (ref 5.0–8.0)

## 2017-09-27 LAB — CBC
HCT: 43.3 % (ref 39.0–52.0)
Hemoglobin: 14.7 g/dL (ref 13.0–17.0)
MCH: 30.1 pg (ref 26.0–34.0)
MCHC: 33.9 g/dL (ref 30.0–36.0)
MCV: 88.5 fL (ref 78.0–100.0)
PLATELETS: 227 10*3/uL (ref 150–400)
RBC: 4.89 MIL/uL (ref 4.22–5.81)
RDW: 12.9 % (ref 11.5–15.5)
WBC: 6.9 10*3/uL (ref 4.0–10.5)

## 2017-09-27 LAB — BASIC METABOLIC PANEL
Anion gap: 12 (ref 5–15)
BUN: 20 mg/dL (ref 8–23)
CALCIUM: 9.2 mg/dL (ref 8.9–10.3)
CO2: 19 mmol/L — ABNORMAL LOW (ref 22–32)
Chloride: 109 mmol/L (ref 98–111)
Creatinine, Ser: 1.44 mg/dL — ABNORMAL HIGH (ref 0.61–1.24)
GFR calc Af Amer: 53 mL/min — ABNORMAL LOW (ref >60)
GFR, EST NON AFRICAN AMERICAN: 45 mL/min — AB (ref >60)
GLUCOSE: 145 mg/dL — AB (ref 70–99)
POTASSIUM: 3.6 mmol/L (ref 3.5–5.1)
SODIUM: 140 mmol/L (ref 135–145)

## 2017-09-27 LAB — I-STAT TROPONIN, ED
TROPONIN I, POC: 0.04 ng/mL (ref 0.00–0.08)
TROPONIN I, POC: 0.02 ng/mL (ref 0.00–0.08)

## 2017-09-27 NOTE — Discharge Instructions (Signed)
It was our pleasure to provide your ER care today - we hope that you feel better.  Rest. Drink adequate fluids.   Follow up with your doctor in the coming week for recheck.  Return to ER if worse, new symptoms, fevers, recurrent or persistent chest pain, trouble breathing, other concern.

## 2017-09-27 NOTE — ED Triage Notes (Signed)
Pt c/o upper chest pressure x's 2-3 days.  St[facility] had cardiac stent placed 3 weeks ago.  Pt also c/o shortness of breathe with the chest pressure,  Nausea at times without vomiting

## 2017-09-27 NOTE — ED Notes (Signed)
Patient drinking water and coffee

## 2017-09-27 NOTE — ED Notes (Signed)
Patient transported to CT 

## 2017-09-27 NOTE — ED Provider Notes (Signed)
[facility] EMERGENCY DEPARTMENT Provider Note   CSN: [id number] Arrival date & time: 09/27/17  1544     History   Chief Complaint Chief Complaint  Patient presents with  . Chest Pain    HPI [name] [name] is a 77 y.o. male.  Patient notes intermittent sensation of feeling weak that travels from head down through chest and entire body in the past week. Episodes last seconds to minutes. Occur at rest. No relation to activity or exertion. +intermittent headaches, dull, diffuse, moderate. Denies change in speech or vision. No numbness or unilateral weakness. No change in balance or functional ability. Denies palpitations. No syncope or fall. Denies exertional cp or discomfort. No increased sob or unusual doe. No fever or chills. No abd pain. Occasional nausea. No vomiting or diarrhea.   The history is provided by the patient.  Chest Pain   Associated symptoms include headaches. Pertinent negatives include no abdominal pain, no back pain, no fever, no numbness, no palpitations and no shortness of breath.    Past Medical History:  Diagnosis Date  . Anemia 10/06/2011  . Anginal pain ([place])   . Arthritis    "joints hurt; shoulders, arms, back" (08/13/2013)  . Atrial fibrillation ([place])   . B12 deficiency   . Carotid stenosis 04/10/11   a. s/p left carotid endarterectomy 04/14/2011.;  b.  Carotid US (11/13):  L CEA ok; RICA 1-39%  . Chronic bronchitis ([place])    "get it q yr"  . Chronic chest pain   . Chronic lower back pain   . Colon polyp    adenomatous  . Coronary artery disease    a. S/p CABG in 2001. b. S/p DES to protected LM and BMS to RCA 2006. c. 12/2009: s/p DES to Cx;  d. 09/2011 Cath: patent stents, patent grafts -->Med Rx.;  e. CP with abnl Nuc => LHC 10/04/12: oLM 30%, dLM stent into the CFX patent, LAD occluded, LIMA-LAD patent, RI 30%, mid AVCFX 30%, oOM1 50%, oRCA stent patent, mRCA 50-60%, Radial graft-Dx patent; EF 55%.=> Med Rx.  f. stent to LM & Circ,    . Daily headache    "for the last couple weeks" (08/13/2013)  . Diverticulosis   . Esophageal stricture   . GERD (gastroesophageal reflux disease)   . Hemorrhoids   . Hiatal hernia   . Hyperlipidemia   . Hypertension   . Left bundle branch block   . Pancreatitis Dec 2011   ERCP ok.  . Prostate cancer [facility])    s/p cryoablation  . Renal cyst    Seen on CT 08/2011 also with circumferential bladder wall thickening  . Skin cancer    "cut it off my right ear"  . Statin intolerance     Patient Active Problem List   Diagnosis Date Noted  . S/P angioplasty with stent 09/12/2017  . Hyperlipidemia LDL goal <70 09/12/2017  . Unstable angina ([place]) 09/09/2017  . AKI (acute kidney injury) ([place]) 10/29/2015  . Dehydration 10/28/2015  . Chest pain 10/27/2015  . Atypical chest pain 08/03/2014  . Abdominal pain, chronic, epigastric 08/26/2013  . Abdominal pain, lower 08/15/2013  . CAD (coronary artery disease) 10/05/2012  . Chest pain syndrome 10/06/2011  . Anemia 10/06/2011  . Memory loss 04/07/2011  . ANXIETY 02/01/2010  . ABDOMINAL PAIN-EPIGASTRIC 02/01/2010  . ADENOCARCINOMA, PROSTATE 12/22/2008  . DYSPHAGIA UNSPECIFIED 11/18/2008  . COLONIC POLYPS, HX OF 11/18/2008  . HYPERCHOLESTEROLEMIA, PURE 01/20/2008  . Essential (primary) hypertension 01/20/2008  .  CAD, ARTERY BYPASS GRAFT 01/20/2008  . LBBB 01/20/2008  . Carotid stenosis 01/20/2008  . GERD 01/20/2008  . CHEST PAIN, NON-CARDIAC 01/20/2008    Past Surgical History:  Procedure Laterality Date  . CARDIAC CATHETERIZATION    . CHOLECYSTECTOMY    . COLONOSCOPY    . CORONARY ANGIOPLASTY WITH STENT PLACEMENT     "1 + 1"  . CORONARY ARTERY BYPASS GRAFT  2001  . CORONARY STENT INTERVENTION N/A 09/11/2017   Procedure: CORONARY STENT INTERVENTION;  Surgeon: Lorretta Harp, MD;  Location: [facility] CV LAB;  Service: Cardiovascular;  Laterality: N/A;  . CORONARY STENT PLACEMENT  09/11/2017   Ost Cx to Prox Cx lesion is 95%  stenosed.  Marland Kitchen ENDARTERECTOMY  04/14/2011   Procedure: ENDARTERECTOMY CAROTID;  Surgeon: Mal Misty, MD;  Location: [facility];  Service: Vascular;  Laterality: Left;  Would like to perform procedure first, at 0730  . EYE SURGERY     cataract  . INGUINAL HERNIA REPAIR Bilateral   . LEFT HEART CATH AND CORS/GRAFTS ANGIOGRAPHY N/A 09/11/2017   Procedure: LEFT HEART CATH AND CORS/GRAFTS ANGIOGRAPHY;  Surgeon: Lorretta Harp, MD;  Location: [facility] CV LAB;  Service: Cardiovascular;  Laterality: N/A;  . LEFT HEART CATHETERIZATION WITH CORONARY/GRAFT ANGIOGRAM N/A 10/05/2011   Procedure: LEFT HEART CATHETERIZATION WITH Beatrix Fetters;  Surgeon: Hillary Bow, MD;  Location: [facility] CATH LAB;  Service: Cardiovascular;  Laterality: N/A;  . LEFT HEART CATHETERIZATION WITH CORONARY/GRAFT ANGIOGRAM N/A 10/04/2012   Procedure: LEFT HEART CATHETERIZATION WITH Beatrix Fetters;  Surgeon: Jolaine Artist, MD;  Location: [facility] CATH LAB;  Service: Cardiovascular;  Laterality: N/A;  . POLYPECTOMY    . PR VEIN BYPASS GRAFT,AORTO-FEM-POP    . PROSTATE CRYOABLATION          Home Medications    Prior to Admission medications   Medication Sig Start Date End Date Taking? Authorizing Provider  amLODipine (NORVASC) 10 MG tablet Take 10 mg by mouth daily.    [provider]  aspirin EC 81 MG tablet Take 81 mg by mouth at bedtime.     [provider]  atorvastatin (LIPITOR) 40 MG tablet Take 1 tablet (40 mg total) by mouth daily at 6 PM. 09/12/17   Daune Perch, NP  cyanocobalamin 1000 MCG tablet Take 1,000 mcg by mouth at bedtime.     [provider]  LORazepam (ATIVAN) 0.5 MG tablet Take 0.5 mg by mouth daily as needed for anxiety. 07/17/16   [provider]  metoCLOPramide (REGLAN) 10 MG tablet Take 10 mg by mouth daily as needed for nausea.    [provider]  metoprolol tartrate (LOPRESSOR) 25 MG tablet Take 25 mg by mouth 2 (two) times daily.     [provider]  nitroGLYCERIN (NITROSTAT) 0.4 MG SL tablet Place 1 tablet (0.4 mg total) under the tongue every 5 (five) minutes as needed. For chest pain 09/13/17   Lelon Perla, MD  pantoprazole (PROTONIX) 40 MG tablet Take 1 tablet (40 mg total) by mouth daily. 09/12/17   Daune Perch, NP  PARoxetine (PAXIL) 20 MG tablet Take 20 mg by mouth at bedtime.  04/18/17   [provider]  ranitidine (ZANTAC) 150 MG tablet Take 150 mg by mouth 2 (two) times daily. 09/09/17   [provider]  Simethicone 125 MG CAPS Take 1 capsule (125 mg total) by mouth daily as needed. Patient taking differently: Take 125 mg by mouth daily as needed (gas).  05/16/17  Lendon Colonel, NP  ticagrelor (BRILINTA) 90 MG TABS tablet Take 1 tablet (90 mg total) by mouth 2 (two) times daily. 09/12/17   Daune Perch, NP    Family History Family History  Problem Relation Age of Onset  . Heart disease Mother        Heart Disease before age 84  . Hypertension Mother   . Heart attack Mother   . Lung cancer Father   . Hypertension Sister   . Heart disease Sister        Heart Disease before age 40  . Cancer Sister   . Heart attack Sister   . Hypertension Brother   . Heart disease Brother        Heart Disease before age 69  . Heart attack Brother   . Colon cancer Neg Hx     Social History Social History   Tobacco Use  . Smoking status: Former Smoker    Years: 0.10    Types: Cigarettes  . Smokeless tobacco: Current User    Types: Chew  . Tobacco comment: "smoked a few cigarettes; no more than 1 month"  Substance Use Topics  . Alcohol use: No    Alcohol/week: 0.0 standard drinks    Comment: "no alcohol since I was a teenager"  . Drug use: No     Allergies   Shellfish allergy; Zolpidem tartrate; Amoxicillin-pot clavulanate; Codeine; Erythromycin; Hydrocodone; Morphine; Nitrofuran derivatives; Oxycodone hcl; and Tramadol   Review of Systems Review of Systems    Constitutional: Negative for fever.  HENT: Negative for sore throat.   Eyes: Negative for visual disturbance.  Respiratory: Negative for shortness of breath.   Cardiovascular: Positive for chest pain. Negative for palpitations and leg swelling.  Gastrointestinal: Negative for abdominal pain.  Genitourinary: Negative for dysuria and flank pain.  Musculoskeletal: Negative for back pain and neck pain.  Skin: Negative for rash.  Neurological: Positive for headaches. Negative for numbness.  Hematological: Does not bruise/bleed easily.  Psychiatric/Behavioral: Negative for confusion.     Physical Exam Updated Vital Signs BP (!) 146/68   Pulse 68   Temp 97.8 F (36.6 C) (Oral)   Resp 17   Ht 1.626 m (5\' 4" )   Wt 62.4 kg   SpO2 100%   BMI 23.61 kg/m   Physical Exam  Constitutional: He is oriented to person, place, and time. He appears well-developed and well-nourished.  HENT:  Head: Atraumatic.  Mouth/Throat: Oropharynx is clear and moist.  Eyes: Conjunctivae are normal.  Neck: Neck supple. No tracheal deviation present.  Cardiovascular: Normal rate, regular rhythm, normal heart sounds and intact distal pulses. Exam reveals no gallop and no friction rub.  No murmur heard. Pulmonary/Chest: Effort normal and breath sounds normal. No accessory muscle usage. No respiratory distress.  Abdominal: Soft. Bowel sounds are normal. He exhibits no distension. There is no tenderness.  Genitourinary:  Genitourinary Comments: No cva tenderness  Musculoskeletal: He exhibits no edema or tenderness.  Neurological: He is alert and oriented to person, place, and time.  Speech clear/fluent. Motor intact bil, stre 5/5. sens grossly intact. No pronator drift. Steady gait.   Skin: Skin is warm and dry. No rash noted.  Psychiatric: He has a normal mood and affect.  Nursing note and vitals reviewed.    ED Treatments / Results  Labs (all labs ordered are listed, but only abnormal results are  displayed) Results for orders placed or performed during the hospital encounter of 32/99/24  Basic metabolic panel  Result Value Ref Range   Sodium 140 135 - 145 mmol/L   Potassium 3.6 3.5 - 5.1 mmol/L   Chloride 109 98 - 111 mmol/L   CO2 19 (L) 22 - 32 mmol/L   Glucose, Bld 145 (H) 70 - 99 mg/dL   BUN 20 8 - 23 mg/dL   Creatinine, Ser 1.44 (H) 0.61 - 1.24 mg/dL   Calcium 9.2 8.9 - 10.3 mg/dL   GFR calc non Af Amer 45 (L) >60 mL/min   GFR calc Af Amer 53 (L) >60 mL/min   Anion gap 12 5 - 15  CBC  Result Value Ref Range   WBC 6.9 4.0 - 10.5 K/uL   RBC 4.89 4.22 - 5.81 MIL/uL   Hemoglobin 14.7 13.0 - 17.0 g/dL   HCT 43.3 39.0 - 52.0 %   MCV 88.5 78.0 - 100.0 fL   MCH 30.1 26.0 - 34.0 pg   MCHC 33.9 30.0 - 36.0 g/dL   RDW 12.9 11.5 - 15.5 %   Platelets 227 150 - 400 K/uL  Urinalysis, Routine w reflex microscopic  Result Value Ref Range   Color, Urine YELLOW YELLOW   APPearance CLEAR CLEAR   Specific Gravity, Urine 1.021 1.005 - 1.030   pH 5.0 5.0 - 8.0   Glucose, UA NEGATIVE NEGATIVE mg/dL   Hgb urine dipstick SMALL (A) NEGATIVE   Bilirubin Urine NEGATIVE NEGATIVE   Ketones, ur NEGATIVE NEGATIVE mg/dL   Protein, ur NEGATIVE NEGATIVE mg/dL   Nitrite NEGATIVE NEGATIVE   Leukocytes, UA NEGATIVE NEGATIVE   RBC / HPF 0-5 0 - 5 RBC/hpf   WBC, UA 0-5 0 - 5 WBC/hpf   Bacteria, UA NONE SEEN NONE SEEN   Mucus PRESENT    Hyaline Casts, UA PRESENT   I-stat troponin, ED  Result Value Ref Range   Troponin i, poc 0.04 0.00 - 0.08 ng/mL   Comment 3          I-stat troponin, ED  Result Value Ref Range   Troponin i, poc 0.02 0.00 - 0.08 ng/mL   Comment 3           Dg Chest 2 View  Result Date: 09/27/2017 CLINICAL DATA:  77 y/o M; bilateral in central chest pain, shortness of breath, and nausea for 1 day. EXAM: CHEST - 2 VIEW COMPARISON:  09/09/2016 chest radiograph. FINDINGS: Stable normal cardiac silhouette given projection and technique. Status post CABG with sternotomy wires in  alignment. Aortic atherosclerosis with calcification. Clear lungs. No pleural effusion or pneumothorax. No acute osseous abnormality is evident. IMPRESSION: No acute pulmonary process identified. Electronically Signed   By: Kristine Garbe M.D.   On: 09/27/2017 17:12   Dg Chest 2 View  Result Date: 09/09/2017 CLINICAL DATA:  Chest pain for 2 weeks EXAM: CHEST - 2 VIEW COMPARISON:  09/08/2017, 09/04/2017, 04/20/2017 FINDINGS: Post sternotomy changes. No acute airspace disease or pleural effusion. Stable cardiomediastinal silhouette with aortic atherosclerosis. No pneumothorax. IMPRESSION: No active cardiopulmonary disease. Electronically Signed   By: Donavan Foil M.D.   On: 09/09/2017 21:57   Dg Chest 2 View  Result Date: 09/04/2017 CLINICAL DATA:  Chest pain. EXAM: CHEST - 2 VIEW COMPARISON:  Radiographs of April 20, 2017. FINDINGS: Stable cardiomediastinal silhouette. Status post coronary artery bypass graft. Stable left hilar surgical clips. No pneumothorax or pleural effusion is noted. Both lungs are clear. The visualized skeletal structures are unremarkable. IMPRESSION: No active cardiopulmonary disease. Electronically Signed   By: Marijo Conception, M.D.  On: 09/04/2017 09:34   Nm Myocar Multi W/spect W/wall Motion / Ef  Result Date: 09/10/2017 CLINICAL DATA:  Chest pain/discomfort. History of atrial fibrillation and CABG. EXAM: MYOCARDIAL IMAGING WITH SPECT (REST AND EXERCISE) GATED LEFT VENTRICULAR WALL MOTION STUDY LEFT VENTRICULAR EJECTION FRACTION TECHNIQUE: Standard myocardial SPECT imaging was performed after resting intravenous injection of 10 mCi Tc-38m tetrofosmin. Subsequently, exercise tolerance test was performed by the patient under the supervision of the Cardiology staff. At peak-stress, 30 mCi Tc-52m tetrofosmin was injected intravenously and standard myocardial SPECT imaging was performed. Quantitative gated imaging was also performed to evaluate left ventricular wall  motion, and estimate left ventricular ejection fraction. COMPARISON:  Radiographs 09/09/2017. Nuclear medicine myocardial perfusion study 08/14/2013. FINDINGS: Perfusion: Stable small fixed perfusion defect at the left ventricular apex. There is a questionable new small reversible perfusion defect in the mid lateral wall (summed difference score 5). Wall Motion: New global hypokinesis with asymmetric involvement of the septum and inferior wall. No significant ventricular dilatation. Left Ventricular Ejection Fraction: 43 %.  Previously 60%. End diastolic volume 88 ml End systolic volume 50 ml IMPRESSION: 1. Possible new small area of inducible ischemia in the mid lateral wall. Otherwise stable perfusion study. 2. Progressive global hypokinesis with asymmetric involvement of the septum and inferior wall. 3. Left ventricular ejection fraction 43% 4. Non invasive risk stratification*: Moderate *2012 Appropriate Use Criteria for Coronary Revascularization Focused Update: J Am Coll Cardiol. 0240;97(3):532-992. http://content.airportbarriers.com.aspx?articleid=[email or web address] Electronically Signed   By: Richardean Sale M.D.   On: 09/10/2017 15:44    EKG EKG Interpretation  Date/Time:  Thursday September 27 2017 15:54:26 EDT Ventricular Rate:  65 PR Interval:  136 QRS Duration: 136 QT Interval:  452 QTC Calculation: 470 R Axis:   115 Text Interpretation:  Normal sinus rhythm Non-specific intra-ventricular conduction block Non-specific ST-t changes No significant change since last tracing Confirmed by Lajean Saver [id number]) on 09/27/2017 6:35:44 PM   Radiology Dg Chest 2 View  Result Date: 09/27/2017 CLINICAL DATA:  77 y/o M; bilateral in central chest pain, shortness of breath, and nausea for 1 day. EXAM: CHEST - 2 VIEW COMPARISON:  09/09/2016 chest radiograph. FINDINGS: Stable normal cardiac silhouette given projection and technique. Status post CABG with sternotomy wires in alignment. Aortic atherosclerosis  with calcification. Clear lungs. No pleural effusion or pneumothorax. No acute osseous abnormality is evident. IMPRESSION: No acute pulmonary process identified. Electronically Signed   By: Kristine Garbe M.D.   On: 09/27/2017 17:12   Ct Head Wo Contrast  Result Date: 09/27/2017 CLINICAL DATA:  77 year old male with altered level of consciousness EXAM: CT HEAD WITHOUT CONTRAST TECHNIQUE: Contiguous axial images were obtained from the base of the skull through the vertex without intravenous contrast. COMPARISON:  Prior head CT 04/17/2017 FINDINGS: Brain: No evidence of acute infarction, hemorrhage, hydrocephalus, extra-axial collection or mass lesion/mass effect. Stable remote lacunar infarcts in the left inferior subinsular cortex and right caudate head. Vascular: No hyperdense vessel or unexpected calcification. Skull: Normal. Negative for fracture or focal lesion. Sinuses/Orbits: No acute finding. Other: None. IMPRESSION: No acute intracranial abnormality. Electronically Signed   By: Jacqulynn Cadet M.D.   On: 09/27/2017 19:36    Procedures Procedures (including critical care time)  Medications Ordered in ED Medications - No data to display   Initial Impression / Assessment and Plan / ED Course  I have reviewed the triage vital signs and the nursing notes.  Pertinent labs & imaging results that were available during my care of the patient were  reviewed by me and considered in my medical decision making (see chart for details).  Iv ns. Continuous pulse ox and monitor. Ecg. Labs.   Reviewed nursing notes and prior charts for additional history.   Labs reviewed  - initial trop normal.   cxr reviewed - no pna.   Ct reviewed - no acute process.  Recheck pt comfortable, no chest pain or sob. Await delta trop.  Delta trop is normal.  Pt is tolerating fluids well. No distress.   Pt currently appears stable for d/c.     Final Clinical Impressions(s) / ED Diagnoses   Final  diagnoses:  None    ED Discharge Orders    None       Lajean Saver, MD 09/27/17 2204

## 2017-09-28 ENCOUNTER — Encounter (HOSPITAL_COMMUNITY): Payer: Self-pay

## 2017-09-28 ENCOUNTER — Telehealth: Payer: Self-pay | Admitting: Cardiology

## 2017-09-28 ENCOUNTER — Emergency Department (HOSPITAL_COMMUNITY)
Admission: EM | Admit: 2017-09-28 | Discharge: 2017-09-28 | Disposition: A | Discharge status: Home / Self Care | Payer: Medicare Other | Attending: Emergency Medicine | Admitting: Emergency Medicine

## 2017-09-28 DIAGNOSIS — Z8546 Personal history of malignant neoplasm of prostate: Secondary | ICD-10-CM | POA: Insufficient documentation

## 2017-09-28 DIAGNOSIS — Z951 Presence of aortocoronary bypass graft: Secondary | ICD-10-CM | POA: Insufficient documentation

## 2017-09-28 DIAGNOSIS — Z79899 Other long term (current) drug therapy: Secondary | ICD-10-CM | POA: Insufficient documentation

## 2017-09-28 DIAGNOSIS — I25119 Atherosclerotic heart disease of native coronary artery with unspecified angina pectoris: Secondary | ICD-10-CM | POA: Diagnosis not present

## 2017-09-28 DIAGNOSIS — Z7982 Long term (current) use of aspirin: Secondary | ICD-10-CM | POA: Diagnosis not present

## 2017-09-28 DIAGNOSIS — R0602 Shortness of breath: Secondary | ICD-10-CM | POA: Diagnosis not present

## 2017-09-28 DIAGNOSIS — I208 Other forms of angina pectoris: Secondary | ICD-10-CM

## 2017-09-28 DIAGNOSIS — Z87891 Personal history of nicotine dependence: Secondary | ICD-10-CM | POA: Diagnosis not present

## 2017-09-28 DIAGNOSIS — I10 Essential (primary) hypertension: Secondary | ICD-10-CM | POA: Diagnosis not present

## 2017-09-28 DIAGNOSIS — R079 Chest pain, unspecified: Secondary | ICD-10-CM | POA: Diagnosis not present

## 2017-09-28 LAB — CBC WITH DIFFERENTIAL/PLATELET
ABS IMMATURE GRANULOCYTES: 0 10*3/uL (ref 0.0–0.1)
Basophils Absolute: 0 10*3/uL (ref 0.0–0.1)
Basophils Relative: 1 %
Eosinophils Absolute: 0.1 10*3/uL (ref 0.0–0.7)
Eosinophils Relative: 1 %
HCT: 44.8 % (ref 39.0–52.0)
Hemoglobin: 15 g/dL (ref 13.0–17.0)
IMMATURE GRANULOCYTES: 0 %
LYMPHS ABS: 1.7 10*3/uL (ref 0.7–4.0)
Lymphocytes Relative: 29 %
MCH: 30.2 pg (ref 26.0–34.0)
MCHC: 33.5 g/dL (ref 30.0–36.0)
MCV: 90.3 fL (ref 78.0–100.0)
MONOS PCT: 8 %
Monocytes Absolute: 0.5 10*3/uL (ref 0.1–1.0)
NEUTROS ABS: 3.5 10*3/uL (ref 1.7–7.7)
NEUTROS PCT: 61 %
PLATELETS: 224 10*3/uL (ref 150–400)
RBC: 4.96 MIL/uL (ref 4.22–5.81)
RDW: 13.1 % (ref 11.5–15.5)
WBC: 5.7 10*3/uL (ref 4.0–10.5)

## 2017-09-28 LAB — COMPREHENSIVE METABOLIC PANEL
ALBUMIN: 3.8 g/dL (ref 3.5–5.0)
ALT: 19 U/L (ref 0–44)
AST: 18 U/L (ref 15–41)
Alkaline Phosphatase: 59 U/L (ref 38–126)
Anion gap: 9 (ref 5–15)
BUN: 18 mg/dL (ref 8–23)
CALCIUM: 9.3 mg/dL (ref 8.9–10.3)
CO2: 26 mmol/L (ref 22–32)
Chloride: 108 mmol/L (ref 98–111)
Creatinine, Ser: 1.36 mg/dL — ABNORMAL HIGH (ref 0.61–1.24)
GFR calc Af Amer: 56 mL/min — ABNORMAL LOW (ref >60)
GFR, EST NON AFRICAN AMERICAN: 49 mL/min — AB (ref >60)
GLUCOSE: 105 mg/dL — AB (ref 70–99)
Potassium: 4.3 mmol/L (ref 3.5–5.1)
Sodium: 143 mmol/L (ref 135–145)
Total Bilirubin: 2.3 mg/dL — ABNORMAL HIGH (ref 0.3–1.2)
Total Protein: 6.7 g/dL (ref 6.5–8.1)

## 2017-09-28 LAB — I-STAT TROPONIN, ED: Troponin i, poc: 0.02 ng/mL (ref 0.00–0.08)

## 2017-09-28 MED ORDER — ISOSORBIDE MONONITRATE ER 30 MG PO TB24: 30.0000 mg | ORAL_TABLET | Freq: Every day | ORAL | 0 refills | Status: DC

## 2017-09-28 MED ORDER — TICAGRELOR 90 MG PO TABS
90.0000 mg | ORAL_TABLET | Freq: Once | ORAL | Status: AC
  Administered 2017-09-28: 90 mg via ORAL
  Filled 2017-09-28: qty 1

## 2017-09-28 NOTE — ED Notes (Addendum)
Pt confirmed he called his family and they are on there way. Signature pad unavailable at time of pt discharge. Pt verbalized understanding of d/c instructions/prescriptions.

## 2017-09-28 NOTE — Discharge Instructions (Addendum)
Start imdur 30 mg daily.   Continue your other meds, especially your brillinta.   Call cardiologist in 2 days for appointment next week   Return to ER if you have worse chest pain, shortness of breath

## 2017-09-28 NOTE — Telephone Encounter (Signed)
Returned call to patient's daughter [name].She stated father has been having chest pain and sob every day for the past 1&1/2 weeks.Rates pain # 3 at present.Stated he had stents 2 weeks ago.Stated he went to ED yesterday and was told could be blood thinners causing chest pain.Spoke to DOD Dr.Croitoru he advised go to ED and make sure cardiology checks patient.Trish called and made aware.

## 2017-09-28 NOTE — ED Provider Notes (Signed)
[facility] DEPARTMENT Provider Note   CSN: [id number] Arrival date & time: 09/28/17  1359     History   Chief Complaint No chief complaint on file.   HPI [name] [name] is a 77 y.o. male hx of afib, CAD s/p stents 2 weeks ago on Brilinta, here presenting with chest pain.  Patient had an episode of left-sided chest pain for the last several days.  Pain is lasting several seconds and not exertional.  Patient was seen in the ED yesterday and had 2 negative troponins and was told to see cardiology today.  He called cardiology and was sent here for urgent evaluation given that he had recent stents.  He states that he was in the hospital yesterday so did not take his Brilinta yesterday or today.  Denies any active chest pain currently.  The history is provided by the patient.    Past Medical History:  Diagnosis Date  . Anemia 10/06/2011  . Anginal pain ([place])   . Arthritis    "joints hurt; shoulders, arms, back" (08/13/2013)  . Atrial fibrillation ([place])   . B12 deficiency   . Carotid stenosis 04/10/11   a. s/p left carotid endarterectomy 04/14/2011.;  b.  Carotid US (11/13):  L CEA ok; RICA 1-39%  . Chronic bronchitis ([place])    "get it q yr"  . Chronic chest pain   . Chronic lower back pain   . Colon polyp    adenomatous  . Coronary artery disease    a. S/p CABG in 2001. b. S/p DES to protected LM and BMS to RCA 2006. c. 12/2009: s/p DES to Cx;  d. 09/2011 Cath: patent stents, patent grafts -->Med Rx.;  e. CP with abnl Nuc => LHC 10/04/12: oLM 30%, dLM stent into the CFX patent, LAD occluded, LIMA-LAD patent, RI 30%, mid AVCFX 30%, oOM1 50%, oRCA stent patent, mRCA 50-60%, Radial graft-Dx patent; EF 55%.=> Med Rx.  f. stent to LM & Circ,  . Daily headache    "for the last couple weeks" (08/13/2013)  . Diverticulosis   . Esophageal stricture   . GERD (gastroesophageal reflux disease)   . Hemorrhoids   . Hiatal hernia   . Hyperlipidemia   . Hypertension   .  Left bundle branch block   . Pancreatitis Dec 2011   ERCP ok.  . Prostate cancer [facility])    s/p cryoablation  . Renal cyst    Seen on CT 08/2011 also with circumferential bladder wall thickening  . Skin cancer    "cut it off my right ear"  . Statin intolerance     Patient Active Problem List   Diagnosis Date Noted  . S/P angioplasty with stent 09/12/2017  . Hyperlipidemia LDL goal <70 09/12/2017  . Unstable angina ([place]) 09/09/2017  . AKI (acute kidney injury) ([place]) 10/29/2015  . Dehydration 10/28/2015  . Chest pain 10/27/2015  . Atypical chest pain 08/03/2014  . Abdominal pain, chronic, epigastric 08/26/2013  . Abdominal pain, lower 08/15/2013  . CAD (coronary artery disease) 10/05/2012  . Chest pain syndrome 10/06/2011  . Anemia 10/06/2011  . Memory loss 04/07/2011  . ANXIETY 02/01/2010  . ABDOMINAL PAIN-EPIGASTRIC 02/01/2010  . ADENOCARCINOMA, PROSTATE 12/22/2008  . DYSPHAGIA UNSPECIFIED 11/18/2008  . COLONIC POLYPS, HX OF 11/18/2008  . HYPERCHOLESTEROLEMIA, PURE 01/20/2008  . Essential (primary) hypertension 01/20/2008  . CAD, ARTERY BYPASS GRAFT 01/20/2008  . LBBB 01/20/2008  . Carotid stenosis 01/20/2008  . GERD 01/20/2008  . CHEST PAIN,  NON-CARDIAC 01/20/2008    Past Surgical History:  Procedure Laterality Date  . CARDIAC CATHETERIZATION    . CHOLECYSTECTOMY    . COLONOSCOPY    . CORONARY ANGIOPLASTY WITH STENT PLACEMENT     "1 + 1"  . CORONARY ARTERY BYPASS GRAFT  2001  . CORONARY STENT INTERVENTION N/A 09/11/2017   Procedure: CORONARY STENT INTERVENTION;  Surgeon: Lorretta Harp, MD;  Location: [facility];  Service: Cardiovascular;  Laterality: N/A;  . CORONARY STENT PLACEMENT  09/11/2017   Ost Cx to Prox Cx lesion is 95% stenosed.  Marland Kitchen ENDARTERECTOMY  04/14/2011   Procedure: ENDARTERECTOMY CAROTID;  Surgeon: Mal Misty, MD;  Location: [facility];  Service: Vascular;  Laterality: Left;  Would like to perform procedure first, at 0730  . EYE SURGERY       cataract  . INGUINAL HERNIA REPAIR Bilateral   . LEFT HEART CATH AND CORS/GRAFTS ANGIOGRAPHY N/A 09/11/2017   Procedure: LEFT HEART CATH AND CORS/GRAFTS ANGIOGRAPHY;  Surgeon: Lorretta Harp, MD;  Location: [facility];  Service: Cardiovascular;  Laterality: N/A;  . LEFT HEART CATHETERIZATION WITH CORONARY/GRAFT ANGIOGRAM N/A 10/05/2011   Procedure: LEFT HEART CATHETERIZATION WITH Beatrix Fetters;  Surgeon: Hillary Bow, MD;  Location: [facility] CATH LAB;  Service: Cardiovascular;  Laterality: N/A;  . LEFT HEART CATHETERIZATION WITH CORONARY/GRAFT ANGIOGRAM N/A 10/04/2012   Procedure: LEFT HEART CATHETERIZATION WITH Beatrix Fetters;  Surgeon: Jolaine Artist, MD;  Location: [facility] CATH LAB;  Service: Cardiovascular;  Laterality: N/A;  . POLYPECTOMY    . PR VEIN BYPASS GRAFT,AORTO-FEM-POP    . PROSTATE CRYOABLATION          Home Medications    Prior to Admission medications   Medication Sig Start Date End Date Taking? Authorizing Provider  amLODipine (NORVASC) 10 MG tablet Take 10 mg by mouth daily.    [provider]  aspirin EC 81 MG tablet Take 81 mg by mouth at bedtime.     [provider]  atorvastatin (LIPITOR) 40 MG tablet Take 1 tablet (40 mg total) by mouth daily at 6 PM. 09/12/17   Daune Perch, NP  cyanocobalamin 1000 MCG tablet Take 1,000 mcg by mouth at bedtime.     [provider]  LORazepam (ATIVAN) 0.5 MG tablet Take 0.5 mg by mouth daily as needed for anxiety. 07/17/16   [provider]  metoCLOPramide (REGLAN) 10 MG tablet Take 10 mg by mouth daily as needed for nausea.    [provider]  metoprolol tartrate (LOPRESSOR) 25 MG tablet Take 25 mg by mouth 2 (two) times daily.    [provider]  nitroGLYCERIN (NITROSTAT) 0.4 MG SL tablet Place 1 tablet (0.4 mg total) under the tongue every 5 (five) minutes as needed. For chest pain 09/13/17   Lelon Perla, MD  pantoprazole (PROTONIX) 40 MG  tablet Take 1 tablet (40 mg total) by mouth daily. 09/12/17   Daune Perch, NP  PARoxetine (PAXIL) 20 MG tablet Take 20 mg by mouth at bedtime.  04/18/17   [provider]  ranitidine (ZANTAC) 150 MG tablet Take 150 mg by mouth 2 (two) times daily. 09/09/17   [provider]  Simethicone 125 MG CAPS Take 1 capsule (125 mg total) by mouth daily as needed. Patient taking differently: Take 125 mg by mouth daily as needed (gas).  05/16/17   Lendon Colonel, NP  ticagrelor (BRILINTA) 90 MG TABS tablet Take 1 tablet (90 mg total) by mouth  2 (two) times daily. 09/12/17   Daune Perch, NP    Family History Family History  Problem Relation Age of Onset  . Heart disease Mother        Heart Disease before age 24  . Hypertension Mother   . Heart attack Mother   . Lung cancer Father   . Hypertension Sister   . Heart disease Sister        Heart Disease before age 71  . Cancer Sister   . Heart attack Sister   . Hypertension Brother   . Heart disease Brother        Heart Disease before age 70  . Heart attack Brother   . Colon cancer Neg Hx     Social History Social History   Tobacco Use  . Smoking status: Former Smoker    Years: 0.10    Types: Cigarettes  . Smokeless tobacco: Current User    Types: Chew  . Tobacco comment: "smoked a few cigarettes; no more than 1 month"  Substance Use Topics  . Alcohol use: No    Alcohol/week: 0.0 standard drinks    Comment: "no alcohol since I was a teenager"  . Drug use: No     Allergies   Shellfish allergy; Zolpidem tartrate; Amoxicillin-pot clavulanate; Codeine; Erythromycin; Hydrocodone; Morphine; Nitrofuran derivatives; Oxycodone hcl; and Tramadol   Review of Systems Review of Systems  Cardiovascular: Positive for chest pain.  All other systems reviewed and are negative.    Physical Exam Updated Vital Signs BP 140/60   Pulse (!) 57   Temp 98 F (36.7 C) (Oral)   Resp 12   SpO2 99%   Physical Exam    Constitutional: He is oriented to person, place, and time. He appears well-developed.  HENT:  Head: Normocephalic.  Mouth/Throat: Oropharynx is clear and moist.  Eyes: Pupils are equal, round, and reactive to light. Conjunctivae and EOM are normal.  Neck: Normal range of motion. Neck supple.  Cardiovascular: Normal rate, regular rhythm and normal heart sounds.  Pulmonary/Chest: Effort normal and breath sounds normal. No stridor. No respiratory distress.  Abdominal: Soft. Bowel sounds are normal. He exhibits no distension. There is no tenderness.  Musculoskeletal: Normal range of motion.  Neurological: He is alert and oriented to person, place, and time. No cranial nerve deficit. Coordination normal.  Skin: Skin is warm. Capillary refill takes less than 2 seconds.  Psychiatric: He has a normal mood and affect.  Nursing note and vitals reviewed.    ED Treatments / Results  Labs (all labs ordered are listed, but only abnormal results are displayed) Labs Reviewed  CBC WITH DIFFERENTIAL/PLATELET  COMPREHENSIVE METABOLIC PANEL  I-STAT TROPONIN, ED    EKG EKG Interpretation  Date/Time:  Friday September 28 2017 19:03:50 EDT Ventricular Rate:  56 PR Interval:    QRS Duration: 150 QT Interval:  491 QTC Calculation: 474 R Axis:   -65 Text Interpretation:  Sinus rhythm Left bundle branch block No significant change since last tracing Confirmed by Wandra Arthurs [phone]) on 09/28/2017 7:06:37 PM   Radiology Dg Chest 2 View  Result Date: 09/27/2017 CLINICAL DATA:  77 y/o M; bilateral in central chest pain, shortness of breath, and nausea for 1 day. EXAM: CHEST - 2 VIEW COMPARISON:  09/09/2016 chest radiograph. FINDINGS: Stable normal cardiac silhouette given projection and technique. Status post CABG with sternotomy wires in alignment. Aortic atherosclerosis with calcification. Clear lungs. No pleural effusion or pneumothorax. No acute osseous abnormality is evident. IMPRESSION:  No acute  pulmonary process identified. Electronically Signed   By: Kristine Garbe M.D.   On: 09/27/2017 17:12   Ct Head Wo Contrast  Result Date: 09/27/2017 CLINICAL DATA:  77 year old male with altered level of consciousness EXAM: CT HEAD WITHOUT CONTRAST TECHNIQUE: Contiguous axial images were obtained from the base of the skull through the vertex without intravenous contrast. COMPARISON:  Prior head CT 04/17/2017 FINDINGS: Brain: No evidence of acute infarction, hemorrhage, hydrocephalus, extra-axial collection or mass lesion/mass effect. Stable remote lacunar infarcts in the left inferior subinsular cortex and right caudate head. Vascular: No hyperdense vessel or unexpected calcification. Skull: Normal. Negative for fracture or focal lesion. Sinuses/Orbits: No acute finding. Other: None. IMPRESSION: No acute intracranial abnormality. Electronically Signed   By: Jacqulynn Cadet M.D.   On: 09/27/2017 19:36    Procedures Procedures (including critical care time)  Medications Ordered in ED Medications - No data to display   Initial Impression / Assessment and Plan / ED Course  I have reviewed the triage vital signs and the nursing notes.  Pertinent labs & imaging results that were available during my care of the patient were reviewed by me and considered in my medical decision making (see chart for details).     [name] [name] is a 77 y.o. male here with chest pain. Had recent stents 2 weeks ago. Seen in the ED yesterday but sent to see cardiology today. No chest pain currently. Will repeat troponin, consult cardiology.   9:20 PM Dr. Raiford Simmonds from cardiology saw patient. Patient had three negative troponins in the last 24 hrs. Recommend imdur 30 mg daily and follow up at the office next week. Stable for discharge.    Final Clinical Impressions(s) / ED Diagnoses   Final diagnoses:  None    ED Discharge Orders    None       Drenda Freeze, MD 09/28/17 2121

## 2017-09-28 NOTE — Consult Note (Signed)
Cardiology Consult    Patient ID: ELIEL DUDDING MRN: [id number], DOB/AGE: January 20, 1941   Admit date: 09/28/2017 Date of Consult: 09/28/2017  Primary Physician: Cyndi Bender, PA-C Primary Cardiologist: Kirk Ruths, MD Requesting Provider: Darl Householder  Patient Profile    [name] [name] is a 77 y.o. male with a history of CAD status post PCI, who is being seen today for the evaluation of chest pain  Past Medical History   Past Medical History:  Diagnosis Date  . Anemia 10/06/2011  . Anginal pain ([place])   . Arthritis    "joints hurt; shoulders, arms, back" (08/13/2013)  . Atrial fibrillation ([place])   . B12 deficiency   . Carotid stenosis 04/10/11   a. s/p left carotid endarterectomy 04/14/2011.;  b.  Carotid US (11/13):  L CEA ok; RICA 1-39%  . Chronic bronchitis ([place])    "get it q yr"  . Chronic chest pain   . Chronic lower back pain   . Colon polyp    adenomatous  . Coronary artery disease    a. S/p CABG in 2001. b. S/p DES to protected LM and BMS to RCA 2006. c. 12/2009: s/p DES to Cx;  d. 09/2011 Cath: patent stents, patent grafts -->Med Rx.;  e. CP with abnl Nuc => LHC 10/04/12: oLM 30%, dLM stent into the CFX patent, LAD occluded, LIMA-LAD patent, RI 30%, mid AVCFX 30%, oOM1 50%, oRCA stent patent, mRCA 50-60%, Radial graft-Dx patent; EF 55%.=> Med Rx.  f. stent to LM & Circ,  . Daily headache    "for the last couple weeks" (08/13/2013)  . Diverticulosis   . Esophageal stricture   . GERD (gastroesophageal reflux disease)   . Hemorrhoids   . Hiatal hernia   . Hyperlipidemia   . Hypertension   . Left bundle branch block   . Pancreatitis Dec 2011   ERCP ok.  . Prostate cancer [facility])    s/p cryoablation  . Renal cyst    Seen on CT 08/2011 also with circumferential bladder wall thickening  . Skin cancer    "cut it off my right ear"  . Statin intolerance     Past Surgical History:  Procedure Laterality Date  . CARDIAC CATHETERIZATION    . CHOLECYSTECTOMY    . COLONOSCOPY    .  CORONARY ANGIOPLASTY WITH STENT PLACEMENT     "1 + 1"  . CORONARY ARTERY BYPASS GRAFT  2001  . CORONARY STENT INTERVENTION N/A 09/11/2017   Procedure: CORONARY STENT INTERVENTION;  Surgeon: Lorretta Harp, MD;  Location: [place] [facility];  Service: Cardiovascular;  Laterality: N/A;  . CORONARY STENT PLACEMENT  09/11/2017   Ost Cx to Prox Cx lesion is 95% stenosed.  Marland Kitchen ENDARTERECTOMY  04/14/2011   Procedure: ENDARTERECTOMY CAROTID;  Surgeon: Mal Misty, MD;  Location: [place];  Service: Vascular;  Laterality: Left;  Would like to perform procedure first, at 0730  . EYE SURGERY     cataract  . INGUINAL HERNIA REPAIR Bilateral   . LEFT HEART CATH AND CORS/GRAFTS ANGIOGRAPHY N/A 09/11/2017   Procedure: LEFT HEART CATH AND CORS/GRAFTS ANGIOGRAPHY;  Surgeon: Lorretta Harp, MD;  Location: [place] [facility];  Service: Cardiovascular;  Laterality: N/A;  . LEFT HEART CATHETERIZATION WITH CORONARY/GRAFT ANGIOGRAM N/A 10/05/2011   Procedure: LEFT HEART CATHETERIZATION WITH Beatrix Fetters;  Surgeon: Hillary Bow, MD;  Location: [facility];  Service: Cardiovascular;  Laterality: N/A;  . LEFT HEART CATHETERIZATION WITH CORONARY/GRAFT ANGIOGRAM N/A 10/04/2012  Procedure: LEFT HEART CATHETERIZATION WITH Beatrix Fetters;  Surgeon: Jolaine Artist, MD;  Location: [facility] CATH LAB;  Service: Cardiovascular;  Laterality: N/A;  . POLYPECTOMY    . PR VEIN BYPASS GRAFT,AORTO-FEM-POP    . PROSTATE CRYOABLATION       Allergies  Allergies  Allergen Reactions  . Shellfish Allergy Anaphylaxis  . Zolpidem Tartrate Other (See Comments)    hallucinations  . Amoxicillin-Pot Clavulanate Other (See Comments)    REACTION: 'Burns' Stomach  . Codeine Nausea And Vomiting  . Erythromycin Diarrhea, Nausea And Vomiting and Other (See Comments)    All mycins cause upset stomach  . Hydrocodone Nausea And Vomiting  . Morphine Nausea And Vomiting  . Nitrofuran Derivatives Other (See Comments)     Unknown reaction  . Oxycodone Hcl Nausea And Vomiting  . Tramadol Nausea And Vomiting    History of Present Illness    Chest pain shortness of breath  Patient has a known history of CAD.  Status post PCI had to stent procedure 2 weeks ago.  Uncomplicated postprocedural course.  Then developed over the course of the last 7 days some chest pain at rest and with mild activity it worsened.  Pain lasts for minutes.  Also noted to have worsening shortness of breath mostly with activity.  He is not felt to need to take any extra nitroglycerin since the pain was not at all close to what he had 2 weeks ago.  In general if he feels that his all over state is much improved compared to the months prior to the stents.  He denies PND, lower extremity edema.  No orthopnea.  No syncope or presyncope.  He has missed 2 doses of his Brilinta.  Presented yesterday to the emergency room for these complaints.  Was discharged home after 2- troponins.  Then was advised to call the cardiology office.  When he called the cardiology office he was advised to go to the emergency room be evaluated by cardiologist ED.  I was consulted to see the patient on his arrival for the second time to the ED.  He missed both Brilinta in the setting of his ED stay.  Blood pressures have been slightly elevated in the 140s 150s.  Inpatient Medications      Family History    Family History  Problem Relation Age of Onset  . Heart disease Mother        Heart Disease before age 26  . Hypertension Mother   . Heart attack Mother   . Lung cancer Father   . Hypertension Sister   . Heart disease Sister        Heart Disease before age 75  . Cancer Sister   . Heart attack Sister   . Hypertension Brother   . Heart disease Brother        Heart Disease before age 44  . Heart attack Brother   . Colon cancer Neg Hx    He indicated that his mother is deceased. He indicated that his father is deceased. He indicated that his sister is  deceased. He indicated that his brother is deceased. He indicated that his maternal grandmother is deceased. He indicated that his maternal grandfather is deceased. He indicated that his paternal grandmother is deceased. He indicated that his paternal grandfather is deceased. He indicated that his daughter is alive. He indicated that the status of his neg hx is unknown.   Social History    Social History   Socioeconomic History  .  Marital status: Widowed    Spouse name: Not on file  . Number of children: 2  . Years of education: Not on file  . Highest education level: Not on file  Occupational History  . Occupation: Retired    Comment: raised Public librarian  . Financial resource strain: Not on file  . Food insecurity:    Worry: Not on file    Inability: Not on file  . Transportation needs:    Medical: Not on file    Non-medical: Not on file  Tobacco Use  . Smoking status: Former Smoker    Years: 0.10    Types: Cigarettes  . Smokeless tobacco: Current User    Types: Chew  . Tobacco comment: "smoked a few cigarettes; no more than 1 month"  Substance and Sexual Activity  . Alcohol use: No    Alcohol/week: 0.0 standard drinks    Comment: "no alcohol since I was a teenager"  . Drug use: No  . Sexual activity: Not Currently  Lifestyle  . Physical activity:    Days per week: Not on file    Minutes per session: Not on file  . Stress: Not on file  Relationships  . Social connections:    Talks on phone: Not on file    Gets together: Not on file    Attends religious service: Not on file    Active member of club or organization: Not on file    Attends meetings of clubs or organizations: Not on file    Relationship status: Not on file  . Intimate partner violence:    Fear of current or ex partner: Not on file    Emotionally abused: Not on file    Physically abused: Not on file    Forced sexual activity: Not on file  Other Topics Concern  . Not on file  Social  History Narrative   Patient is illiterate. He cannot read or write. He left school at about the seventh grade.   As of 10/2015 he reports that his wife is chronically ill at home with heart disease and COPD and is under hospice care. The bulk of the care is given by the patient and daughter.     Review of Systems    General:  No chills, fever, night sweats or weight changes.  Cardiovascular:  No chest pain, dyspnea on exertion, edema, orthopnea, palpitations, paroxysmal nocturnal dyspnea. Dermatological: No rash, lesions/masses Respiratory: No cough, dyspnea Urologic: No hematuria, dysuria Abdominal:   No nausea, vomiting, diarrhea, bright red blood per rectum, melena, or hematemesis Neurologic:  No visual changes, wkns, changes in mental status. All other systems reviewed and are otherwise negative except as noted above.  Physical Exam    Blood pressure 138/80, pulse (!) 56, temperature 98 F (36.7 C), temperature source Oral, resp. rate 18, SpO2 95 %.  General: Pleasant, NAD Psych: Normal affect. Neuro: Alert and oriented X 3. Moves all extremities spontaneously. HEENT: Normal  Neck: Supple without bruits or JVD. Lungs:  Resp regular and unlabored, CTA. Heart: RRR no s3, s4, or murmurs. Abdomen: Soft, non-tender, non-distended, BS + x 4.  Extremities: No clubbing, cyanosis or edema. DP/PT/Radials 2+ and equal bilaterally.  Labs    Troponin [facility] of Care Test) Recent Labs    09/28/17 1916  TROPIPOC 0.02   No results for input(s): CKTOTAL, CKMB, TROPONINI in the last 72 hours. Lab Results  Component Value Date   WBC 5.7 09/28/2017   HGB 15.0 09/28/2017  HCT 44.8 09/28/2017   MCV 90.3 09/28/2017   PLT 224 09/28/2017    Recent Labs  Lab 09/28/17 1912  NA 143  K 4.3  CL 108  CO2 26  BUN 18  CREATININE 1.36*  CALCIUM 9.3  PROT 6.7  BILITOT 2.3*  ALKPHOS 59  ALT 19  AST 18  GLUCOSE 105*   Lab Results  Component Value Date   CHOL 223 (H) 10/03/2012   HDL  30 (L) 10/03/2012   LDLCALC 150 (H) 10/03/2012   TRIG 217 (H) 10/03/2012   Lab Results  Component Value Date   DDIMER 0.50 (H) 10/05/2011     Radiology Studies    Dg Chest 2 View  Result Date: 09/27/2017 CLINICAL DATA:  76 y/o M; bilateral in central chest pain, shortness of breath, and nausea for 1 day. EXAM: CHEST - 2 VIEW COMPARISON:  09/09/2016 chest radiograph. FINDINGS: Stable normal cardiac silhouette given projection and technique. Status post CABG with sternotomy wires in alignment. Aortic atherosclerosis with calcification. Clear lungs. No pleural effusion or pneumothorax. No acute osseous abnormality is evident. IMPRESSION: No acute pulmonary process identified. Electronically Signed   By: Kristine Garbe M.D.   On: 09/27/2017 17:12   Dg Chest 2 View  Result Date: 09/09/2017 CLINICAL DATA:  Chest pain for 2 weeks EXAM: CHEST - 2 VIEW COMPARISON:  09/08/2017, 09/04/2017, 04/20/2017 FINDINGS: Post sternotomy changes. No acute airspace disease or pleural effusion. Stable cardiomediastinal silhouette with aortic atherosclerosis. No pneumothorax. IMPRESSION: No active cardiopulmonary disease. Electronically Signed   By: Donavan Foil M.D.   On: 09/09/2017 21:57   Dg Chest 2 View  Result Date: 09/04/2017 CLINICAL DATA:  Chest pain. EXAM: CHEST - 2 VIEW COMPARISON:  Radiographs of April 20, 2017. FINDINGS: Stable cardiomediastinal silhouette. Status post coronary artery bypass graft. Stable left hilar surgical clips. No pneumothorax or pleural effusion is noted. Both lungs are clear. The visualized skeletal structures are unremarkable. IMPRESSION: No active cardiopulmonary disease. Electronically Signed   By: Marijo Conception, M.D.   On: 09/04/2017 09:34   Ct Head Wo Contrast  Result Date: 09/27/2017 CLINICAL DATA:  77 year old male with altered level of consciousness EXAM: CT HEAD WITHOUT CONTRAST TECHNIQUE: Contiguous axial images were obtained from the base of the skull through  the vertex without intravenous contrast. COMPARISON:  Prior head CT 04/17/2017 FINDINGS: Brain: No evidence of acute infarction, hemorrhage, hydrocephalus, extra-axial collection or mass lesion/mass effect. Stable remote lacunar infarcts in the left inferior subinsular cortex and right caudate head. Vascular: No hyperdense vessel or unexpected calcification. Skull: Normal. Negative for fracture or focal lesion. Sinuses/Orbits: No acute finding. Other: None. IMPRESSION: No acute intracranial abnormality. Electronically Signed   By: Jacqulynn Cadet M.D.   On: 09/27/2017 19:36   Nm Myocar Multi W/spect W/wall Motion / Ef  Result Date: 09/10/2017 CLINICAL DATA:  Chest pain/discomfort. History of atrial fibrillation and CABG. EXAM: MYOCARDIAL IMAGING WITH SPECT (REST AND EXERCISE) GATED LEFT VENTRICULAR WALL MOTION STUDY LEFT VENTRICULAR EJECTION FRACTION TECHNIQUE: Standard myocardial SPECT imaging was performed after resting intravenous injection of 10 mCi Tc-36m tetrofosmin. Subsequently, exercise tolerance test was performed by the patient under the supervision of the Cardiology staff. At peak-stress, 30 mCi Tc-64m tetrofosmin was injected intravenously and standard myocardial SPECT imaging was performed. Quantitative gated imaging was also performed to evaluate left ventricular wall motion, and estimate left ventricular ejection fraction. COMPARISON:  Radiographs 09/09/2017. Nuclear medicine myocardial perfusion study 08/14/2013. FINDINGS: Perfusion: Stable small fixed perfusion defect at the  left ventricular apex. There is a questionable new small reversible perfusion defect in the mid lateral wall (summed difference score 5). Wall Motion: New global hypokinesis with asymmetric involvement of the septum and inferior wall. No significant ventricular dilatation. Left Ventricular Ejection Fraction: 43 %.  Previously 60%. End diastolic volume 88 ml End systolic volume 50 ml IMPRESSION: 1. Possible new small area  of inducible ischemia in the mid lateral wall. Otherwise stable perfusion study. 2. Progressive global hypokinesis with asymmetric involvement of the septum and inferior wall. 3. Left ventricular ejection fraction 43% 4. Non invasive risk stratification*: Moderate *2012 Appropriate Use Criteria for Coronary Revascularization Focused Update: J Am Coll Cardiol. 7741;28(7):867-672. http://content.airportbarriers.com.aspx?articleid=[email or web address] Electronically Signed   By: Richardean Sale M.D.   On: 09/10/2017 15:44    ECG & Cardiac Imaging    Normal sinus rhythm.  ST depressions lateral leads improved from 2 weeks ago.  Assessment & Plan    77 year old man with past medical history of CAD status post PCI.  Resents with chest pain shortness of breath.  Second presentation to the emergency room.  Once negative x3 the last 24 hours.  ECG with improving ST depressions.  Euvolemic on physical exam.  Well-appearing and chest pain-free.  Recommend agents: -Start on isosorbide mononitrate 30 mg. -Follow-up with cardiology as outpatient -Better blood pressure control was advised. -Adherence to Brilinta and aspirin has been strongly recommended.  Signed, Cristina Gong, MD 09/28/2017, 8:37 PM  For questions or updates, please contact   Please consult [email or web address] for contact info under Cardiology/STEMI.

## 2017-09-28 NOTE — ED Triage Notes (Signed)
Pt presents again with chest pain. Pt was discharged from here last night, called cardiologist, and was referred here for cardiology consult.  Pt reports pain is to L chest that is intermittent, reports pain worsens with walking.  +shortness of breath.

## 2017-09-28 NOTE — Telephone Encounter (Signed)
° ° °  Pt c/o of Chest Pain: STAT if CP now or developed within 24 hours  1. Are you having CP right now? YES  2. Are you experiencing any other symptoms (ex. SOB, nausea, vomiting, sweating)? SOB, headache, dizziness when standing   3. How long have you been experiencing CP? 1 WEEK   4. Is your CP continuous or coming and going? COMING AND GOING  5. Have you taken Nitroglycerin? UNSURE PER DAUGHTER ?

## 2017-10-01 ENCOUNTER — Ambulatory Visit (INDEPENDENT_AMBULATORY_CARE_PROVIDER_SITE_OTHER): Payer: Medicare Other | Admitting: Cardiology

## 2017-10-01 ENCOUNTER — Encounter: Payer: Self-pay | Admitting: Cardiology

## 2017-10-01 VITALS — BP 132/74 | HR 62 | Ht 64.0 in | Wt 136.6 lb

## 2017-10-01 DIAGNOSIS — I25810 Atherosclerosis of coronary artery bypass graft(s) without angina pectoris: Secondary | ICD-10-CM | POA: Diagnosis not present

## 2017-10-01 DIAGNOSIS — I10 Essential (primary) hypertension: Secondary | ICD-10-CM | POA: Diagnosis not present

## 2017-10-01 DIAGNOSIS — I2510 Atherosclerotic heart disease of native coronary artery without angina pectoris: Secondary | ICD-10-CM | POA: Diagnosis not present

## 2017-10-01 DIAGNOSIS — I447 Left bundle-branch block, unspecified: Secondary | ICD-10-CM | POA: Diagnosis not present

## 2017-10-01 DIAGNOSIS — I208 Other forms of angina pectoris: Secondary | ICD-10-CM | POA: Diagnosis not present

## 2017-10-01 DIAGNOSIS — Z9861 Coronary angioplasty status: Secondary | ICD-10-CM

## 2017-10-01 DIAGNOSIS — Z951 Presence of aortocoronary bypass graft: Secondary | ICD-10-CM | POA: Diagnosis not present

## 2017-10-01 DIAGNOSIS — N183 Chronic kidney disease, stage 3 unspecified: Secondary | ICD-10-CM | POA: Insufficient documentation

## 2017-10-01 DIAGNOSIS — E785 Hyperlipidemia, unspecified: Secondary | ICD-10-CM

## 2017-10-01 MED ORDER — CLOPIDOGREL BISULFATE 75 MG PO TABS: ORAL_TABLET | ORAL | 0 refills | Status: DC

## 2017-10-01 MED ORDER — CLOPIDOGREL BISULFATE 75 MG PO TABS: 75.0000 mg | ORAL_TABLET | Freq: Every day | ORAL | 1 refills | Status: DC

## 2017-10-01 MED ORDER — CLOPIDOGREL BISULFATE 75 MG PO TABS: 75.0000 mg | ORAL_TABLET | Freq: Every day | ORAL | 2 refills | Status: DC

## 2017-10-01 NOTE — Assessment & Plan Note (Signed)
SCr 1.36 on 09/28/17

## 2017-10-01 NOTE — Assessment & Plan Note (Signed)
H/O CABG 2001- multiple PCIs since, last 09/11/17 to LM and CFX- He had a patent radial to the RI, patent LIMA to the LAD, patent ostial stent to the RCA with moderate mid RCA stenosis and a high-grade distal left main, ostial circumflex in-stent restenosis responsible for his accelerated angina and abnormal Myoview stress test.

## 2017-10-01 NOTE — Patient Instructions (Signed)
Medication Instructions:  Start Plavix - take 4 tablets (300mg ) today 10/01/17, then starting tomorrow 10/02/17 start one tablet daily.  If you need a refill on your cardiac medications before your next appointment, please call your pharmacy.  Labwork: None Ordered.  Testing/Procedures: None Ordered.   Follow-Up: Your physician wants you to follow-up in: 4-6 weeks with Dr.Crenshaw.    Thank you for choosing [facility] at [facility]!!

## 2017-10-01 NOTE — Assessment & Plan Note (Signed)
LM and CFX PCI 09/11/17- pt could not tolerate Brilinta -changed to Plavix

## 2017-10-01 NOTE — Assessment & Plan Note (Signed)
Controlled.  

## 2017-10-01 NOTE — Assessment & Plan Note (Signed)
Chronic. 

## 2017-10-01 NOTE — Assessment & Plan Note (Signed)
Intolerant to higher dose statin

## 2017-10-01 NOTE — Progress Notes (Signed)
10/01/2017 [name] [name]   Oct 10, 1940  [id number]  Primary Physician [name] Bender, PA-C Primary Cardiologist: Dr [name]  HPI:  77 y/o male followed by Dr [name] with a history of CAD (s/p CABG 2001 w/ multiple subsequent PCI), carotid disease, chronic chest pain, GERD, and HTN. He presented 09/10/17 with [place]. He had cath and subsequent LM and CFX PCI on 09/11/17. Echo showed normal LVF. He was discharged on Brilinta and ASA. He presented to the ED 09/27/16 with weakness and again on 09/28/17 with chest pain. His Troponin was negative and he was discharged on Imdur 30 mg. In the meantime the determined his Brilinta was causing his symptoms and he stopped it- 3-4 days ago. Since then he says he has felt "great". No chest pain, weakness, or SOB.    Current Outpatient Medications  Medication Sig Dispense Refill  . amLODipine (NORVASC) 10 MG tablet Take 10 mg by mouth daily.    Marland Kitchen aspirin EC 81 MG tablet Take 81 mg by mouth at bedtime.     Marland Kitchen atorvastatin (LIPITOR) 40 MG tablet Take 1 tablet (40 mg total) by mouth daily at 6 PM. 90 tablet 3  . cyanocobalamin 1000 MCG tablet Take 1,000 mcg by mouth at bedtime.     . isosorbide mononitrate (IMDUR) 30 MG 24 hr tablet Take 1 tablet (30 mg total) by mouth daily. 30 tablet 0  . LORazepam (ATIVAN) 0.5 MG tablet Take 0.5 mg by mouth daily as needed for anxiety.  2  . metoCLOPramide (REGLAN) 10 MG tablet Take 10 mg by mouth daily as needed for nausea.    . metoprolol tartrate (LOPRESSOR) 25 MG tablet Take 25 mg by mouth 2 (two) times daily.    . nitroGLYCERIN (NITROSTAT) 0.4 MG SL tablet Place 1 tablet (0.4 mg total) under the tongue every 5 (five) minutes as needed. For chest pain 25 tablet 11  . pantoprazole (PROTONIX) 40 MG tablet Take 1 tablet (40 mg total) by mouth daily. 30 tablet 1  . PARoxetine (PAXIL) 20 MG tablet Take 20 mg by mouth at bedtime.   5  . ranitidine (ZANTAC) 150 MG tablet Take 150 mg by mouth 2 (two) times daily.    . Simethicone  125 MG CAPS Take 1 capsule (125 mg total) by mouth daily as needed. (Patient taking differently: Take 125 mg by mouth daily as needed (gas). ) 30 each 11  . ticagrelor (BRILINTA) 90 MG TABS tablet Take 1 tablet (90 mg total) by mouth 2 (two) times daily. 60 tablet 11  . clopidogrel (PLAVIX) 75 MG tablet Take 1 tablet (75 mg total) by mouth daily. 90 tablet 2   No current facility-administered medications for this visit.     Allergies  Allergen Reactions  . Shellfish Allergy Anaphylaxis  . Zolpidem Tartrate Other (See Comments)    hallucinations  . Amoxicillin-Pot Clavulanate Other (See Comments)    REACTION: 'Burns' Stomach  . Codeine Nausea And Vomiting  . Erythromycin Diarrhea, Nausea And Vomiting and Other (See Comments)    All mycins cause upset stomach  . Hydrocodone Nausea And Vomiting  . Morphine Nausea And Vomiting  . Nitrofuran Derivatives Other (See Comments)    Unknown reaction  . Oxycodone Hcl Nausea And Vomiting  . Tramadol Nausea And Vomiting    Past Medical History:  Diagnosis Date  . Anemia 10/06/2011  . Anginal pain ([place])   . Arthritis    "joints hurt; shoulders, arms, back" (08/13/2013)  . Atrial fibrillation ([place])   .  B12 deficiency   . Carotid stenosis 04/10/11   a. s/p left carotid endarterectomy 04/14/2011.;  b.  Carotid US (11/13):  L CEA ok; RICA 1-39%  . Chronic bronchitis ([place])    "get it q yr"  . Chronic chest pain   . Chronic lower back pain   . Colon polyp    adenomatous  . Coronary artery disease    a. S/p CABG in 2001. b. S/p DES to protected LM and BMS to RCA 2006. c. 12/2009: s/p DES to Cx;  d. 09/2011 Cath: patent stents, patent grafts -->Med Rx.;  e. CP with abnl Nuc => LHC 10/04/12: oLM 30%, dLM stent into the CFX patent, LAD occluded, LIMA-LAD patent, RI 30%, mid AVCFX 30%, oOM1 50%, oRCA stent patent, mRCA 50-60%, Radial graft-Dx patent; EF 55%.=> Med Rx.  f. stent to LM & Circ,  . Daily headache    "for the last couple weeks" (08/13/2013)    . Diverticulosis   . Esophageal stricture   . GERD (gastroesophageal reflux disease)   . Hemorrhoids   . Hiatal hernia   . Hyperlipidemia   . Hypertension   . Left bundle branch block   . Pancreatitis Dec 2011   ERCP ok.  . Prostate cancer [facility])    s/p cryoablation  . Renal cyst    Seen on CT 08/2011 also with circumferential bladder wall thickening  . Skin cancer    "cut it off my right ear"  . Statin intolerance     Social History   Socioeconomic History  . Marital status: Widowed    Spouse name: Not on file  . Number of children: 2  . Years of education: Not on file  . Highest education level: Not on file  Occupational History  . Occupation: Retired    Comment: raised Public librarian  . Financial resource strain: Not on file  . Food insecurity:    Worry: Not on file    Inability: Not on file  . Transportation needs:    Medical: Not on file    Non-medical: Not on file  Tobacco Use  . Smoking status: Former Smoker    Years: 0.10    Types: Cigarettes  . Smokeless tobacco: Current User    Types: Chew  . Tobacco comment: "smoked a few cigarettes; no more than 1 month"  Substance and Sexual Activity  . Alcohol use: No    Alcohol/week: 0.0 standard drinks    Comment: "no alcohol since I was a teenager"  . Drug use: No  . Sexual activity: Not Currently  Lifestyle  . Physical activity:    Days per week: Not on file    Minutes per session: Not on file  . Stress: Not on file  Relationships  . Social connections:    Talks on phone: Not on file    Gets together: Not on file    Attends religious service: Not on file    Active member of club or organization: Not on file    Attends meetings of clubs or organizations: Not on file    Relationship status: Not on file  . Intimate partner violence:    Fear of current or ex partner: Not on file    Emotionally abused: Not on file    Physically abused: Not on file    Forced sexual activity: Not on file  Other  Topics Concern  . Not on file  Social History Narrative   Patient is illiterate. He cannot  read or write. He left school at about the seventh grade.   As of 10/2015 he reports that his wife is chronically ill at home with heart disease and COPD and is under hospice care. The bulk of the care is given by the patient and daughter.     Family History  Problem Relation Age of Onset  . Heart disease Mother        Heart Disease before age 27  . Hypertension Mother   . Heart attack Mother   . Lung cancer Father   . Hypertension Sister   . Heart disease Sister        Heart Disease before age 72  . Cancer Sister   . Heart attack Sister   . Hypertension Brother   . Heart disease Brother        Heart Disease before age 49  . Heart attack Brother   . Colon cancer Neg Hx      Review of Systems: General: negative for chills, fever, night sweats or weight changes.  Cardiovascular: negative for chest pain, dyspnea on exertion, edema, orthopnea, palpitations, paroxysmal nocturnal dyspnea or shortness of breath Dermatological: negative for rash Respiratory: negative for cough or wheezing Urologic: negative for hematuria Abdominal: negative for nausea, vomiting, diarrhea, bright red blood per rectum, melena, or hematemesis Neurologic: negative for visual changes, syncope, or dizziness All other systems reviewed and are otherwise negative except as noted above.    Blood pressure 132/74, pulse 62, height 5\' 4"  (1.626 m), weight 136 lb 9.6 oz (62 kg).  General appearance: alert, cooperative and no distress Neck: no JVD Lungs: clear to auscultation bilaterally Heart: regular rate and rhythm Extremities: no edema Skin: Skin color, texture, turgor normal. No rashes or lesions Neurologic: Grossly normal  EKG [id number]-62, LBBB  ASSESSMENT AND PLAN:   CAD S/P percutaneous coronary angioplasty LM and CFX PCI 09/11/17- pt could not tolerate Brilinta -changed to Plavix  Hx of CABG H/O CABG 2001-  multiple PCIs since, last 09/11/17 to LM and CFX- He had a patent radial to the RI, patent LIMA to the LAD, patent ostial stent to the RCA with moderate mid RCA stenosis and a high-grade distal left main, ostial circumflex in-stent restenosis responsible for his accelerated angina and abnormal Myoview stress test.   Hyperlipidemia LDL goal <70 Intolerant to higher dose statin  Chronic renal disease, stage III (HCC) SCr 1.36 on 09/28/17  Essential (primary) hypertension Controlled  LBBB (left bundle branch block) Chronic   PLAN  Discussed with MD- load with Plavix-300 mg today then 75 mg daily. F/U 4-6 weeks.   Kerin Ransom PA-C 10/01/2017 10:41 AM

## 2017-11-12 ENCOUNTER — Other Ambulatory Visit: Payer: Self-pay | Admitting: Adult Health

## 2017-11-12 NOTE — Telephone Encounter (Signed)
Rx request sent to pharmacy.  

## 2017-11-14 ENCOUNTER — Encounter: Payer: Self-pay | Admitting: Cardiology

## 2017-11-14 ENCOUNTER — Ambulatory Visit (INDEPENDENT_AMBULATORY_CARE_PROVIDER_SITE_OTHER): Payer: Medicare Other | Admitting: Cardiology

## 2017-11-14 VITALS — BP 138/68 | HR 60 | Ht 64.0 in | Wt 141.2 lb

## 2017-11-14 DIAGNOSIS — I2510 Atherosclerotic heart disease of native coronary artery without angina pectoris: Secondary | ICD-10-CM | POA: Diagnosis not present

## 2017-11-14 DIAGNOSIS — Z951 Presence of aortocoronary bypass graft: Secondary | ICD-10-CM

## 2017-11-14 DIAGNOSIS — N183 Chronic kidney disease, stage 3 unspecified: Secondary | ICD-10-CM

## 2017-11-14 DIAGNOSIS — I208 Other forms of angina pectoris: Secondary | ICD-10-CM | POA: Diagnosis not present

## 2017-11-14 DIAGNOSIS — I10 Essential (primary) hypertension: Secondary | ICD-10-CM | POA: Diagnosis not present

## 2017-11-14 DIAGNOSIS — Z9861 Coronary angioplasty status: Secondary | ICD-10-CM | POA: Diagnosis not present

## 2017-11-14 DIAGNOSIS — E785 Hyperlipidemia, unspecified: Secondary | ICD-10-CM | POA: Diagnosis not present

## 2017-11-14 DIAGNOSIS — I447 Left bundle-branch block, unspecified: Secondary | ICD-10-CM

## 2017-11-14 NOTE — Patient Instructions (Signed)
Medication Instructions:  Your Physician recommend you continue on your current medication as directed.    If you need a refill on your cardiac medications before your next appointment, please call your pharmacy.   Lab work: None   Testing/Procedures: None  Follow-Up: At [facility], you and your health needs are our priority.  As part of our continuing mission to provide you with exceptional heart care, we have created designated Provider Care Teams.  These Care Teams include your primary Cardiologist (physician) and Advanced Practice Providers (APPs -  Physician Assistants and Nurse Practitioners) who all work together to provide you with the care you need, when you need it. You will need a follow up appointment in 3 months.  Please call our office 2 months in advance to schedule this appointment.  You may see Kirk Ruths, MD or one of the following Advanced Practice Providers on your designated Care Team:   Kerin Ransom, PA-C Roby Lofts, [place] . Sande Rives, PA-C  Any Other Special Instructions Will Be Listed Below (If Applicable).

## 2017-11-14 NOTE — Progress Notes (Signed)
11/14/2017 [name] [name]   1940-03-17  [id number]  Primary Physician Cyndi Bender, PA-C Primary Cardiologist: Dr [name]  HPI:  Pleasant 77 y/o male followed by Dr [name] with a history of CAD (s/p CABG 2001 w/ multiple subsequent PCI), carotid disease, chronic chest pain, GERD, and HTN. He presented 09/10/17 with [place]. He had cath and subsequent LM and CFX PCI on 09/11/17. Echo showed normal LVF. He was discharged on Brilinta and ASA. He presented to the ED 09/27/17 with weakness and again on 09/28/17 with chest pain. His Troponin was negative and he was discharged on Imdur 30 mg. In the meantime the determined his Brilinta was causing his symptoms and he stopped it. After stopping the Brilinta he says he has felt "great". No chest pain, weakness, or SOB. I saw him in the office 10/01/17. He was loaded with Plavix and started on 75 mg daily. He returns for follow up. He is tolerating the Plavix well. He denies and chest pain.    Current Outpatient Medications  Medication Sig Dispense Refill  . amLODipine (NORVASC) 10 MG tablet Take 10 mg by mouth daily.    Marland Kitchen aspirin EC 81 MG tablet Take 81 mg by mouth at bedtime.     Marland Kitchen atorvastatin (LIPITOR) 40 MG tablet Take 1 tablet (40 mg total) by mouth daily at 6 PM. 90 tablet 3  . clopidogrel (PLAVIX) 75 MG tablet Take 1 tablet (75 mg total) by mouth daily. 90 tablet 2  . cyanocobalamin 1000 MCG tablet Take 1,000 mcg by mouth at bedtime.     . isosorbide mononitrate (IMDUR) 30 MG 24 hr tablet Take 1 tablet (30 mg total) by mouth daily. 30 tablet 0  . LORazepam (ATIVAN) 0.5 MG tablet Take 0.5 mg by mouth daily as needed for anxiety.  2  . metoCLOPramide (REGLAN) 10 MG tablet Take 10 mg by mouth daily as needed for nausea.    . metoprolol tartrate (LOPRESSOR) 25 MG tablet Take 25 mg by mouth 2 (two) times daily.    . nitroGLYCERIN (NITROSTAT) 0.4 MG SL tablet Place 1 tablet (0.4 mg total) under the tongue every 5 (five) minutes as needed. For chest pain  25 tablet 11  . pantoprazole (PROTONIX) 40 MG tablet Take 1 tablet (40 mg total) by mouth daily. 30 tablet 1  . PARoxetine (PAXIL) 20 MG tablet Take 20 mg by mouth at bedtime.   5  . ranitidine (ZANTAC) 150 MG tablet Take 150 mg by mouth 2 (two) times daily.    . Simethicone 125 MG CAPS Take 1 capsule (125 mg total) by mouth daily as needed. (Patient taking differently: Take 125 mg by mouth daily as needed (gas). ) 30 each 11  . ticagrelor (BRILINTA) 90 MG TABS tablet Take 1 tablet (90 mg total) by mouth 2 (two) times daily. 60 tablet 11   No current facility-administered medications for this visit.     Allergies  Allergen Reactions  . Shellfish Allergy Anaphylaxis    Other reaction(s): Other (See Comments)  . Zolpidem Tartrate Other (See Comments)    hallucinations  . Zolpidem Tartrate     Other reaction(s): Other (See Comments) hallucinations  . Brilinta [Ticagrelor]   . Amoxicillin-Pot Clavulanate Other (See Comments)    REACTION: 'Burns' Stomach  . Amoxicillin-Pot Clavulanate     Other reaction(s): Other (See Comments) REACTION: 'Burns' Stomach  . Codeine Nausea And Vomiting  . Erythromycin Diarrhea, Nausea And Vomiting and Other (See Comments)    All  mycins cause upset stomach Other reaction(s): GI Upset (intolerance), Other (See Comments) All mycins cause upset stomach  . Hydrocodone Nausea And Vomiting  . Morphine Nausea And Vomiting  . Nitrofuran Derivatives Other (See Comments)    Unknown reaction unknown  . Oxycodone Hcl Nausea And Vomiting  . Tramadol Nausea And Vomiting    Past Medical History:  Diagnosis Date  . Anemia 10/06/2011  . Anginal pain ([place])   . Arthritis    "joints hurt; shoulders, arms, back" (08/13/2013)  . Atrial fibrillation ([place])   . B12 deficiency   . Carotid stenosis 04/10/11   a. s/p left carotid endarterectomy 04/14/2011.;  b.  Carotid US (11/13):  L CEA ok; RICA 1-39%  . Chronic bronchitis ([place])    "get it q yr"  . Chronic chest pain     . Chronic lower back pain   . Colon polyp    adenomatous  . Coronary artery disease    a. S/p CABG in 2001. b. S/p DES to protected LM and BMS to RCA 2006. c. 12/2009: s/p DES to Cx;  d. 09/2011 Cath: patent stents, patent grafts -->Med Rx.;  e. CP with abnl Nuc => LHC 10/04/12: oLM 30%, dLM stent into the CFX patent, LAD occluded, LIMA-LAD patent, RI 30%, mid AVCFX 30%, oOM1 50%, oRCA stent patent, mRCA 50-60%, Radial graft-Dx patent; EF 55%.=> Med Rx.  f. stent to LM & Circ,  . Daily headache    "for the last couple weeks" (08/13/2013)  . Diverticulosis   . Esophageal stricture   . GERD (gastroesophageal reflux disease)   . Hemorrhoids   . Hiatal hernia   . Hyperlipidemia   . Hypertension   . Left bundle branch block   . Pancreatitis Dec 2011   ERCP ok.  . Prostate cancer [facility])    s/p cryoablation  . Renal cyst    Seen on CT 08/2011 also with circumferential bladder wall thickening  . Skin cancer    "cut it off my right ear"  . Statin intolerance     Social History   Socioeconomic History  . Marital status: Widowed    Spouse name: Not on file  . Number of children: 2  . Years of education: Not on file  . Highest education level: Not on file  Occupational History  . Occupation: Retired    Comment: raised Public librarian  . Financial resource strain: Not on file  . Food insecurity:    Worry: Not on file    Inability: Not on file  . Transportation needs:    Medical: Not on file    Non-medical: Not on file  Tobacco Use  . Smoking status: Former Smoker    Years: 0.10    Types: Cigarettes  . Smokeless tobacco: Current User    Types: Chew  . Tobacco comment: "smoked a few cigarettes; no more than 1 month"  Substance and Sexual Activity  . Alcohol use: No    Alcohol/week: 0.0 standard drinks    Comment: "no alcohol since I was a teenager"  . Drug use: No  . Sexual activity: Not Currently  Lifestyle  . Physical activity:    Days per week: Not on file     Minutes per session: Not on file  . Stress: Not on file  Relationships  . Social connections:    Talks on phone: Not on file    Gets together: Not on file    Attends religious service: Not on file  Active member of club or organization: Not on file    Attends meetings of clubs or organizations: Not on file    Relationship status: Not on file  . Intimate partner violence:    Fear of current or ex partner: Not on file    Emotionally abused: Not on file    Physically abused: Not on file    Forced sexual activity: Not on file  Other Topics Concern  . Not on file  Social History Narrative   Patient is illiterate. He cannot read or write. He left school at about the seventh grade.   As of 10/2015 he reports that his wife is chronically ill at home with heart disease and COPD and is under hospice care. The bulk of the care is given by the patient and daughter.     Family History  Problem Relation Age of Onset  . Heart disease Mother        Heart Disease before age 92  . Hypertension Mother   . Heart attack Mother   . Lung cancer Father   . Hypertension Sister   . Heart disease Sister        Heart Disease before age 63  . Cancer Sister   . Heart attack Sister   . Hypertension Brother   . Heart disease Brother        Heart Disease before age 66  . Heart attack Brother   . Colon cancer Neg Hx      Review of Systems: General: negative for chills, fever, night sweats or weight changes.  Cardiovascular: negative for chest pain, dyspnea on exertion, edema, orthopnea, palpitations, paroxysmal nocturnal dyspnea or shortness of breath Dermatological: negative for rash Respiratory: negative for cough or wheezing Urologic: negative for hematuria Abdominal: negative for nausea, vomiting, diarrhea, bright red blood per rectum, melena, or hematemesis Neurologic: negative for visual changes, syncope, or dizziness All other systems reviewed and are otherwise negative except as noted  above.    Blood pressure 138/68, pulse 60, height 5\' 4"  (1.626 m), weight 141 lb 3.2 oz (64 kg).  General appearance: alert, cooperative and no distress Lungs: clear to auscultation bilaterally Heart: regular rate and rhythm Neurologic: Grossly normal   ASSESSMENT AND PLAN:   CAD S/P percutaneous coronary angioplasty LM and CFX PCI 09/11/17- pt could not tolerate Brilinta -changed to Plavix  Hx of CABG H/O CABG 2001- multiple PCIs since, last 09/11/17 to LM and CFX- He had a patent radial to the RI, patent LIMA to the LAD, patent ostial stent to the RCA with moderate mid RCA stenosis and a high-grade distal left main, ostial circumflex in-stent restenosis responsible for his accelerated angina and abnormal Myoview stress test.   Hyperlipidemia LDL goal <70 Intolerant to higher dose statin  Chronic renal disease, stage III (HCC) SCr 1.36 on 09/28/17  Essential (primary) hypertension Controlled  LBBB (left bundle branch block) Chronic   PLAN  Same Rx- f/u with Dr [name] in 3 months.   Kerin Ransom PA-C 11/14/2017 11:09 AM

## 2017-11-23 ENCOUNTER — Ambulatory Visit (HOSPITAL_COMMUNITY)
Admission: RE | Admit: 2017-11-23 | Discharge: 2017-11-23 | Disposition: A | Discharge status: Home / Self Care | Payer: Medicare Other | Source: Ambulatory Visit | Attending: Family | Admitting: Family

## 2017-11-23 ENCOUNTER — Encounter: Payer: Self-pay | Admitting: Family

## 2017-11-23 ENCOUNTER — Ambulatory Visit (INDEPENDENT_AMBULATORY_CARE_PROVIDER_SITE_OTHER)
Admission: RE | Admit: 2017-11-23 | Discharge: 2017-11-23 | Disposition: A | Discharge status: Home / Self Care | Payer: Medicare Other | Source: Ambulatory Visit | Attending: Family | Admitting: Family

## 2017-11-23 ENCOUNTER — Other Ambulatory Visit: Payer: Self-pay

## 2017-11-23 ENCOUNTER — Ambulatory Visit (INDEPENDENT_AMBULATORY_CARE_PROVIDER_SITE_OTHER): Payer: Medicare Other | Admitting: Family

## 2017-11-23 VITALS — BP 151/77 | HR 56 | Temp 97.4°F | Resp 18 | Ht 64.0 in | Wt 141.0 lb

## 2017-11-23 DIAGNOSIS — I208 Other forms of angina pectoris: Secondary | ICD-10-CM | POA: Diagnosis not present

## 2017-11-23 DIAGNOSIS — I6523 Occlusion and stenosis of bilateral carotid arteries: Secondary | ICD-10-CM | POA: Diagnosis not present

## 2017-11-23 NOTE — Patient Instructions (Signed)

## 2017-11-23 NOTE — Progress Notes (Signed)
Chief Complaint: Follow up Extracranial Carotid Artery Stenosis   History of Present Illness  [name] [name] is a 77 y.o. male  with a hx of left carotid endarterectomy by Dr. Kellie Simmering in 2013 for what was considered symptomatic.    He denies any amaurosis fugax, clumsiness or loss of use of an arm or leg.  He denies any trouble with speech.  He states he has been a little dizzy and went to his PCP and told he was dehydrated and has a vitamin B12 deficiency.  He has been taking a B12 supplement. He states he probably does not drink enough water.   Pt was last evaluated on 05-25-17 by Dr. Donzetta Matters and S. Rhyne PA-C. At tat time velocities in the right ICA increased slightly but he remains asymptomatic and was still in the 40s and 59% window.  He had bilateral lower extremity pain but palpable pulses do not support a vascular nature for this but we will get ABIs at next visit and have him follow-up in 6 months with repeat carotid duplex as well.  He had cardiac stents placed in August 2019; since then he no longer has chest pain, and has more energy.  He is on a CCB and beta blocker for blood pressure control.  He has a hx of CABG in 2001.    Diabetic: no Tobacco use: uses smokeless tobacco since age 40  Pt meds include: Statin : yes ASA: yes Other anticoagulants/antiplatelets: Plavix    Past Medical History:  Diagnosis Date  . Anemia 10/06/2011  . Anginal pain ([place])   . Arthritis    "joints hurt; shoulders, arms, back" (08/13/2013)  . Atrial fibrillation ([place])   . B12 deficiency   . Carotid stenosis 04/10/11   a. s/p left carotid endarterectomy 04/14/2011.;  b.  Carotid US (11/13):  L CEA ok; RICA 1-39%  . Chronic bronchitis ([place])    "get it q yr"  . Chronic chest pain   . Chronic lower back pain   . Colon polyp    adenomatous  . Coronary artery disease    a. S/p CABG in 2001. b. S/p DES to protected LM and BMS to RCA 2006. c. 12/2009: s/p DES to Cx;  d. 09/2011 Cath: patent stents,  patent grafts -->Med Rx.;  e. CP with abnl Nuc => LHC 10/04/12: oLM 30%, dLM stent into the CFX patent, LAD occluded, LIMA-LAD patent, RI 30%, mid AVCFX 30%, oOM1 50%, oRCA stent patent, mRCA 50-60%, Radial graft-Dx patent; EF 55%.=> Med Rx.  f. stent to LM & Circ,  . Daily headache    "for the last couple weeks" (08/13/2013)  . Diverticulosis   . Esophageal stricture   . GERD (gastroesophageal reflux disease)   . Hemorrhoids   . Hiatal hernia   . Hyperlipidemia   . Hypertension   . Left bundle branch block   . Pancreatitis Dec 2011   ERCP ok.  . Prostate cancer [facility])    s/p cryoablation  . Renal cyst    Seen on CT 08/2011 also with circumferential bladder wall thickening  . Skin cancer    "cut it off my right ear"  . Statin intolerance     Social History Social History   Tobacco Use  . Smoking status: Former Smoker    Years: 0.10    Types: Cigarettes  . Smokeless tobacco: Current User    Types: Chew  . Tobacco comment: "smoked a few cigarettes; no more than 1 month"  Substance Use Topics  . Alcohol use: No    Alcohol/week: 0.0 standard drinks    Comment: "no alcohol since I was a teenager"  . Drug use: No    Family History Family History  Problem Relation Age of Onset  . Heart disease Mother        Heart Disease before age 10  . Hypertension Mother   . Heart attack Mother   . Lung cancer Father   . Hypertension Sister   . Heart disease Sister        Heart Disease before age 44  . Cancer Sister   . Heart attack Sister   . Hypertension Brother   . Heart disease Brother        Heart Disease before age 73  . Heart attack Brother   . Colon cancer Neg Hx     Surgical History Past Surgical History:  Procedure Laterality Date  . CARDIAC CATHETERIZATION    . CHOLECYSTECTOMY    . COLONOSCOPY    . CORONARY ANGIOPLASTY WITH STENT PLACEMENT     "1 + 1"  . CORONARY ARTERY BYPASS GRAFT  2001  . CORONARY STENT INTERVENTION N/A 09/11/2017   Procedure: CORONARY STENT  INTERVENTION;  Surgeon: Lorretta Harp, MD;  Location: [place] [facility];  Service: Cardiovascular;  Laterality: N/A;  . CORONARY STENT PLACEMENT  09/11/2017   Ost Cx to Prox Cx lesion is 95% stenosed.  Marland Kitchen ENDARTERECTOMY  04/14/2011   Procedure: ENDARTERECTOMY CAROTID;  Surgeon: Mal Misty, MD;  Location: [place];  Service: Vascular;  Laterality: Left;  Would like to perform procedure first, at 0730  . EYE SURGERY     cataract  . INGUINAL HERNIA REPAIR Bilateral   . LEFT HEART CATH AND CORS/GRAFTS ANGIOGRAPHY N/A 09/11/2017   Procedure: LEFT HEART CATH AND CORS/GRAFTS ANGIOGRAPHY;  Surgeon: Lorretta Harp, MD;  Location: [place] [facility];  Service: Cardiovascular;  Laterality: N/A;  . LEFT HEART CATHETERIZATION WITH CORONARY/GRAFT ANGIOGRAM N/A 10/05/2011   Procedure: LEFT HEART CATHETERIZATION WITH Beatrix Fetters;  Surgeon: Hillary Bow, MD;  Location: [facility] CATH LAB;  Service: Cardiovascular;  Laterality: N/A;  . LEFT HEART CATHETERIZATION WITH CORONARY/GRAFT ANGIOGRAM N/A 10/04/2012   Procedure: LEFT HEART CATHETERIZATION WITH Beatrix Fetters;  Surgeon: Jolaine Artist, MD;  Location: [facility] CATH LAB;  Service: Cardiovascular;  Laterality: N/A;  . POLYPECTOMY    . PR VEIN BYPASS GRAFT,AORTO-FEM-POP    . PROSTATE CRYOABLATION      Allergies  Allergen Reactions  . Shellfish Allergy Anaphylaxis    Other reaction(s): Other (See Comments)  . Zolpidem Tartrate Other (See Comments)    hallucinations  . Zolpidem Tartrate     Other reaction(s): Other (See Comments) hallucinations  . Brilinta [Ticagrelor]   . Amoxicillin-Pot Clavulanate Other (See Comments)    REACTION: 'Burns' Stomach  . Amoxicillin-Pot Clavulanate     Other reaction(s): Other (See Comments) REACTION: 'Burns' Stomach  . Codeine Nausea And Vomiting  . Erythromycin Diarrhea, Nausea And Vomiting and Other (See Comments)    All mycins cause upset stomach Other reaction(s): GI Upset (intolerance),  Other (See Comments) All mycins cause upset stomach  . Hydrocodone Nausea And Vomiting  . Morphine Nausea And Vomiting  . Nitrofuran Derivatives Other (See Comments)    Unknown reaction unknown  . Oxycodone Hcl Nausea And Vomiting  . Tramadol Nausea And Vomiting    Current Outpatient Medications  Medication Sig Dispense Refill  . amLODipine (NORVASC) 10 MG tablet Take  10 mg by mouth daily.    Marland Kitchen aspirin EC 81 MG tablet Take 81 mg by mouth at bedtime.     Marland Kitchen atorvastatin (LIPITOR) 40 MG tablet Take 1 tablet (40 mg total) by mouth daily at 6 PM. 90 tablet 3  . clopidogrel (PLAVIX) 75 MG tablet Take 1 tablet (75 mg total) by mouth daily. 90 tablet 2  . cyanocobalamin 1000 MCG tablet Take 1,000 mcg by mouth at bedtime.     . isosorbide mononitrate (IMDUR) 30 MG 24 hr tablet Take 1 tablet (30 mg total) by mouth daily. 30 tablet 0  . LORazepam (ATIVAN) 0.5 MG tablet Take 0.5 mg by mouth daily as needed for anxiety.  2  . metoCLOPramide (REGLAN) 10 MG tablet Take 10 mg by mouth daily as needed for nausea.    . metoprolol tartrate (LOPRESSOR) 25 MG tablet Take 25 mg by mouth 2 (two) times daily.    . nitroGLYCERIN (NITROSTAT) 0.4 MG SL tablet Place 1 tablet (0.4 mg total) under the tongue every 5 (five) minutes as needed. For chest pain 25 tablet 11  . pantoprazole (PROTONIX) 40 MG tablet Take 1 tablet (40 mg total) by mouth daily. 30 tablet 1  . PARoxetine (PAXIL) 20 MG tablet Take 20 mg by mouth at bedtime.   5  . ranitidine (ZANTAC) 150 MG tablet Take 150 mg by mouth 2 (two) times daily.    . Simethicone 125 MG CAPS Take 1 capsule (125 mg total) by mouth daily as needed. (Patient taking differently: Take 125 mg by mouth daily as needed (gas). ) 30 each 11   No current facility-administered medications for this visit.     Review of Systems : See HPI for pertinent positives and negatives.  Physical Examination  Vitals:   11/23/17 1426 11/23/17 1428  BP: 130/71 (!) 151/77  Pulse: (!) 56    Resp: 18   Temp: (!) 97.4 F (36.3 C)   TempSrc: Oral   SpO2: 98%   Weight: 141 lb (64 kg)   Height: 5\' 4"  (1.626 m)    Body mass index is 24.2 kg/m.  General: WDWN male in NAD GAIT: normal Eyes: PERRLA HENT: No gross abnormalities. Edentulous.  Pulmonary:  Respirations are non-labored, good air movement in all fields, CTAB, no rales, rhonchi, or wheezes. Cardiac: regular rhythm, no detected murmur.  VASCULAR EXAM Carotid Bruits Right Left   Negative Negative     Abdominal aortic pulse is not palpable. Radial pulses are 2+ right, 1+ left palpable.                                                                                                                            LE Pulses Right Left       FEMORAL  2+ palpable  2+ palpable        POPLITEAL  2+ palpable   1+ palpable       POSTERIOR TIBIAL  1+ palpable  1+ palpable        DORSALIS PEDIS      ANTERIOR TIBIAL 2+ palpable  2+ palpable     Gastrointestinal: soft, nontender, BS WNL, no r/g, no palpable masses. Musculoskeletal: no muscle atrophy/wasting. M/S 5/5 throughout, extremities without ischemic changes. Skin: No rashes, no ulcers, no cellulitis.   Neurologic:  A&O X 3; appropriate affect, sensation is normal; speech is normal, CN 2-12 intact except has some hearing loss, pain and light touch intact in extremities, motor exam as listed above. Psychiatric: Normal thought content, mood appropriate to clinical situation.    Assessment: ZAKAI GONYEA is a 77 y.o. male with a hx of left carotid endarterectomy by Dr. Kellie Simmering in 2013 for what was considered symptomatic.  Pt states he was told that he had a stroke at some time in the past, states he does not recall any stroke sx's, has no neurological deficits.   His pedal pulses are palpable bilaterally, no signs of ischemia in his feet or legs.    DATA  Carotid Duplex (11-23-17): Right ICA: 40-59% stenosis Left ICA: CEA site with 1-39% stenosis Bilateral  vertebral artery flow is antegrade.  Bilateral subclavian artery waveforms are normal.  No change compared to the exam on 05-25-17.  ABI (Date: 11/23/2017):  R:   ABI: 1.06 (no previous for comparison),   PT: bi  DP: tri  TBI:  0.76, toe pressure 122  L:   ABI: 1.04,   PT: tri  DP: tri  TBI: 0.81, toe pressure 130 Normal bilateral ABI and TBI with tri and biphasic waveforms.    Plan: Follow-up in 1 year with Carotid Duplex scan. Will not repeat ABI's unless he develops signs or sx's suspicious of PAD.   Over 3 minutes was spent counseling patient re smokeless tobacco cessation, and patient was given several free resources re this.  I discussed in depth with the patient the nature of atherosclerosis, and emphasized the importance of maximal medical management including strict control of blood pressure, blood glucose, and lipid levels, obtaining regular exercise, and cessation of tobacco use.  The patient is aware that without maximal medical management the underlying atherosclerotic disease process will progress, limiting the benefit of any interventions. The patient was given information about stroke prevention and what symptoms should prompt the patient to seek immediate medical care. Thank you for allowing [place] to participate in this patient's care.  Clemon Chambers, RN, MSN, FNP-C [facility] Office: [phone]  Clinic Physician: Early on call  11/23/17 2:31 PM

## 2017-12-03 ENCOUNTER — Ambulatory Visit: Payer: Medicare Other | Admitting: Cardiology

## 2017-12-06 DIAGNOSIS — H52223 Regular astigmatism, bilateral: Secondary | ICD-10-CM | POA: Diagnosis not present

## 2017-12-06 DIAGNOSIS — Z9842 Cataract extraction status, left eye: Secondary | ICD-10-CM | POA: Diagnosis not present

## 2017-12-06 DIAGNOSIS — Z9841 Cataract extraction status, right eye: Secondary | ICD-10-CM | POA: Diagnosis not present

## 2017-12-06 DIAGNOSIS — H40023 Open angle with borderline findings, high risk, bilateral: Secondary | ICD-10-CM | POA: Diagnosis not present

## 2017-12-28 DIAGNOSIS — I2510 Atherosclerotic heart disease of native coronary artery without angina pectoris: Secondary | ICD-10-CM | POA: Diagnosis not present

## 2017-12-28 DIAGNOSIS — R109 Unspecified abdominal pain: Secondary | ICD-10-CM | POA: Diagnosis not present

## 2017-12-28 DIAGNOSIS — Z23 Encounter for immunization: Secondary | ICD-10-CM | POA: Diagnosis not present

## 2017-12-28 DIAGNOSIS — E7800 Pure hypercholesterolemia, unspecified: Secondary | ICD-10-CM | POA: Diagnosis not present

## 2017-12-28 DIAGNOSIS — I679 Cerebrovascular disease, unspecified: Secondary | ICD-10-CM | POA: Diagnosis not present

## 2017-12-28 DIAGNOSIS — Z79899 Other long term (current) drug therapy: Secondary | ICD-10-CM | POA: Diagnosis not present

## 2017-12-28 DIAGNOSIS — Z125 Encounter for screening for malignant neoplasm of prostate: Secondary | ICD-10-CM | POA: Diagnosis not present

## 2017-12-28 DIAGNOSIS — K219 Gastro-esophageal reflux disease without esophagitis: Secondary | ICD-10-CM | POA: Diagnosis not present

## 2018-01-09 ENCOUNTER — Telehealth: Payer: Self-pay | Admitting: *Deleted

## 2018-01-09 ENCOUNTER — Telehealth: Payer: Self-pay | Admitting: Neurology

## 2018-01-09 DIAGNOSIS — E785 Hyperlipidemia, unspecified: Secondary | ICD-10-CM

## 2018-01-09 NOTE — Telephone Encounter (Signed)
Carmen lmom regarding [name] [name] and him having headaches and sharp pains in his head. She said he is seeing things that are not there as well as fluid in his ears. They feel he may need an appointment with Dr. Carles Collet. Please Advise. Thanks

## 2018-01-09 NOTE — Telephone Encounter (Addendum)
Left message for pt to call   ----- Message from Lelon Perla, MD sent at 01/09/2018 10:14 AM EST ----- Make sure patient taking Lipitor 40 mg daily.  If so add Zetia 10 mg daily.  Check lipids and liver in 6 weeks. Kirk Ruths, MD

## 2018-01-09 NOTE — Telephone Encounter (Signed)
Error with phone number listed in prior note. The correct number is [phone]. Thanks

## 2018-01-10 NOTE — Telephone Encounter (Signed)
Left message on machine for patient's daughter to call back. He has only ever been seen here for tremor and dizziness (which this was r/o to be a neurologic issue). Will await call back to discuss with daughter, but will make her aware that Dr. Carles Collet treats movement disorders.

## 2018-01-10 NOTE — Telephone Encounter (Signed)
  Ms Gildardo Cranker was returning your call

## 2018-01-11 NOTE — Telephone Encounter (Signed)
Spoke with pt dtr, she reports the patient has only been taking the atorvastatin for about 2 weeks now. She is aware labs will need to be repeated and those orders placed in the mail to the patient.

## 2018-01-24 NOTE — Progress Notes (Signed)
HPI: FU CAD and chronic CP. S/P CABG 2001; multiple PCI since. Abd ultrasound 8/18 showed no aneurysm. Last cath 8/19 60 RCA, 100 LAD, 95 LM, 95 Lcx; patent radial graft to ramus, patent LIMA to LAD; had PCI of LM and Lcx. Last echo 8/19 showed normal LV function, grade 2 DD, mild LAE, trace AI and MR. ABIs 11/19 normal. Carotid dopplers 11/19 showed 40-59 right and 1-39 left. Vascular disease followed by vascular surgery. Since last seen, patient has "little chest pain".  Chronic dizziness but no syncope.  No pedal edema.  Mild dyspnea on exertion.  Current Outpatient Medications  Medication Sig Dispense Refill  . amLODipine (NORVASC) 10 MG tablet Take 10 mg by mouth daily.    Marland Kitchen aspirin EC 81 MG tablet Take 81 mg by mouth at bedtime.     Marland Kitchen atorvastatin (LIPITOR) 40 MG tablet Take 1 tablet (40 mg total) by mouth daily at 6 PM. 90 tablet 3  . clopidogrel (PLAVIX) 75 MG tablet Take 1 tablet (75 mg total) by mouth daily. 90 tablet 2  . cyanocobalamin 1000 MCG tablet Take 1,000 mcg by mouth at bedtime.     . isosorbide mononitrate (IMDUR) 30 MG 24 hr tablet Take 1 tablet (30 mg total) by mouth daily. ** DO NOT CRUSH ** 90 tablet 2  . LORazepam (ATIVAN) 0.5 MG tablet Take 0.5 mg by mouth daily as needed for anxiety.  2  . metoCLOPramide (REGLAN) 10 MG tablet Take 10 mg by mouth daily as needed for nausea.    . metoprolol tartrate (LOPRESSOR) 25 MG tablet Take 25 mg by mouth 2 (two) times daily.    . nitroGLYCERIN (NITROSTAT) 0.4 MG SL tablet Place 1 tablet (0.4 mg total) under the tongue every 5 (five) minutes as needed. For chest pain 25 tablet 11  . pantoprazole (PROTONIX) 40 MG tablet Take 1 tablet (40 mg total) by mouth daily. 30 tablet 1  . PARoxetine (PAXIL) 20 MG tablet Take 20 mg by mouth at bedtime.   5  . ranitidine (ZANTAC) 150 MG tablet Take 150 mg by mouth 2 (two) times daily.    . Simethicone 125 MG CAPS Take 1 capsule (125 mg total) by mouth daily as needed. (Patient taking  differently: Take 125 mg by mouth daily as needed (gas). ) 30 each 11   No current facility-administered medications for this visit.      Past Medical History:  Diagnosis Date  . Anemia 10/06/2011  . Anginal pain ([place])   . Arthritis    "joints hurt; shoulders, arms, back" (08/13/2013)  . Atrial fibrillation ([place])   . B12 deficiency   . Carotid stenosis 04/10/11   a. s/p left carotid endarterectomy 04/14/2011.;  b.  Carotid US (11/13):  L CEA ok; RICA 1-39%  . Chronic bronchitis ([place])    "get it q yr"  . Chronic chest pain   . Chronic lower back pain   . Colon polyp    adenomatous  . Coronary artery disease    a. S/p CABG in 2001. b. S/p DES to protected LM and BMS to RCA 2006. c. 12/2009: s/p DES to Cx;  d. 09/2011 Cath: patent stents, patent grafts -->Med Rx.;  e. CP with abnl Nuc => LHC 10/04/12: oLM 30%, dLM stent into the CFX patent, LAD occluded, LIMA-LAD patent, RI 30%, mid AVCFX 30%, oOM1 50%, oRCA stent patent, mRCA 50-60%, Radial graft-Dx patent; EF 55%.=> Med Rx.  f. stent to LM &  Circ,  . Daily headache    "for the last couple weeks" (08/13/2013)  . Diverticulosis   . Esophageal stricture   . GERD (gastroesophageal reflux disease)   . Hemorrhoids   . Hiatal hernia   . Hyperlipidemia   . Hypertension   . Left bundle branch block   . Pancreatitis Dec 2011   ERCP ok.  . Prostate cancer [facility])    s/p cryoablation  . Renal cyst    Seen on CT 08/2011 also with circumferential bladder wall thickening  . Skin cancer    "cut it off my right ear"  . Statin intolerance     Past Surgical History:  Procedure Laterality Date  . CARDIAC CATHETERIZATION    . CHOLECYSTECTOMY    . COLONOSCOPY    . CORONARY ANGIOPLASTY WITH STENT PLACEMENT     "1 + 1"  . CORONARY ARTERY BYPASS GRAFT  2001  . CORONARY STENT INTERVENTION N/A 09/11/2017   Procedure: CORONARY STENT INTERVENTION;  Surgeon: Lorretta Harp, MD;  Location: [place] [facility];  Service: Cardiovascular;  Laterality: N/A;   . CORONARY STENT PLACEMENT  09/11/2017   Ost Cx to Prox Cx lesion is 95% stenosed.  Marland Kitchen ENDARTERECTOMY  04/14/2011   Procedure: ENDARTERECTOMY CAROTID;  Surgeon: Mal Misty, MD;  Location: [place];  Service: Vascular;  Laterality: Left;  Would like to perform procedure first, at 0730  . EYE SURGERY     cataract  . INGUINAL HERNIA REPAIR Bilateral   . LEFT HEART CATH AND CORS/GRAFTS ANGIOGRAPHY N/A 09/11/2017   Procedure: LEFT HEART CATH AND CORS/GRAFTS ANGIOGRAPHY;  Surgeon: Lorretta Harp, MD;  Location: [place] [facility];  Service: Cardiovascular;  Laterality: N/A;  . LEFT HEART CATHETERIZATION WITH CORONARY/GRAFT ANGIOGRAM N/A 10/05/2011   Procedure: LEFT HEART CATHETERIZATION WITH Beatrix Fetters;  Surgeon: Hillary Bow, MD;  Location: [facility] CATH LAB;  Service: Cardiovascular;  Laterality: N/A;  . LEFT HEART CATHETERIZATION WITH CORONARY/GRAFT ANGIOGRAM N/A 10/04/2012   Procedure: LEFT HEART CATHETERIZATION WITH Beatrix Fetters;  Surgeon: Jolaine Artist, MD;  Location: [facility] CATH LAB;  Service: Cardiovascular;  Laterality: N/A;  . POLYPECTOMY    . PR VEIN BYPASS GRAFT,AORTO-FEM-POP    . PROSTATE CRYOABLATION      Social History   Socioeconomic History  . Marital status: Widowed    Spouse name: Not on file  . Number of children: 2  . Years of education: Not on file  . Highest education level: Not on file  Occupational History  . Occupation: Retired    Comment: raised Public librarian  . Financial resource strain: Not on file  . Food insecurity:    Worry: Not on file    Inability: Not on file  . Transportation needs:    Medical: Not on file    Non-medical: Not on file  Tobacco Use  . Smoking status: Former Smoker    Years: 0.10    Types: Cigarettes  . Smokeless tobacco: Current User    Types: Chew  . Tobacco comment: "smoked a few cigarettes; no more than 1 month"  Substance and Sexual Activity  . Alcohol use: No    Alcohol/week: 0.0  standard drinks    Comment: "no alcohol since I was a teenager"  . Drug use: No  . Sexual activity: Not Currently  Lifestyle  . Physical activity:    Days per week: Not on file    Minutes per session: Not on file  . Stress: Not  on file  Relationships  . Social connections:    Talks on phone: Not on file    Gets together: Not on file    Attends religious service: Not on file    Active member of club or organization: Not on file    Attends meetings of clubs or organizations: Not on file    Relationship status: Not on file  . Intimate partner violence:    Fear of current or ex partner: Not on file    Emotionally abused: Not on file    Physically abused: Not on file    Forced sexual activity: Not on file  Other Topics Concern  . Not on file  Social History Narrative   Patient is illiterate. He cannot read or write. He left school at about the seventh grade.   As of 10/2015 he reports that his wife is chronically ill at home with heart disease and COPD and is under hospice care. The bulk of the care is given by the patient and daughter.    Family History  Problem Relation Age of Onset  . Heart disease Mother        Heart Disease before age 38  . Hypertension Mother   . Heart attack Mother   . Lung cancer Father   . Hypertension Sister   . Heart disease Sister        Heart Disease before age 37  . Cancer Sister   . Heart attack Sister   . Hypertension Brother   . Heart disease Brother        Heart Disease before age 83  . Heart attack Brother   . Colon cancer Neg Hx     ROS: no fevers or chills, productive cough, hemoptysis, dysphasia, odynophagia, melena, hematochezia, dysuria, hematuria, rash, seizure activity, orthopnea, PND, pedal edema, claudication. Remaining systems are negative.  Physical Exam: Well-developed well-nourished in no acute distress.  Skin is warm and dry.  HEENT is normal.  Neck is supple.  Chest is clear to auscultation with normal expansion.    Cardiovascular exam is regular rate and rhythm.  Abdominal exam nontender or distended. No masses palpated. Extremities show no edema. neuro grossly intact  ECG-sinus rhythm with left bundle branch block.  Personally reviewed  A/P  1 coronary artery disease-plan to continue aspirin, Plavix and statin.  2 hypertension-patient's blood pressure is mildly elevated.  I have asked him to follow this and we will advance regimen as needed.  3 hyperlipidemia-continue statin.  There is occasional hip pain.  If it worsens he would like to come off of statin.  4 carotid artery disease-continue aspirin and statin.  Followed by vascular surgery.  5 chest pain-extremely difficult.  Patient has chronic chest pain but also recent PCI.  Electrocardiogram not helpful as he has a baseline left bundle branch block.  His symptoms do not appear to be bothersome at present.  Continue medical therapy.  Kirk Ruths, MD

## 2018-01-29 ENCOUNTER — Other Ambulatory Visit: Payer: Self-pay | Admitting: Adult Health

## 2018-01-30 NOTE — Telephone Encounter (Signed)
Rx(s) sent to pharmacy electronically.  

## 2018-01-31 ENCOUNTER — Other Ambulatory Visit: Payer: Self-pay

## 2018-01-31 ENCOUNTER — Other Ambulatory Visit: Payer: Medicare Other

## 2018-01-31 DIAGNOSIS — E785 Hyperlipidemia, unspecified: Secondary | ICD-10-CM

## 2018-02-01 ENCOUNTER — Encounter: Payer: Self-pay | Admitting: *Deleted

## 2018-02-01 LAB — HEPATIC FUNCTION PANEL
ALT: 15 IU/L (ref 0–44)
AST: 14 IU/L (ref 0–40)
Albumin: 4.3 g/dL (ref 3.5–4.8)
Alkaline Phosphatase: 74 IU/L (ref 39–117)
BILIRUBIN, DIRECT: 0.25 mg/dL (ref 0.00–0.40)
Bilirubin Total: 1.2 mg/dL (ref 0.0–1.2)
Total Protein: 6.6 g/dL (ref 6.0–8.5)

## 2018-02-01 LAB — LIPID PANEL
Chol/HDL Ratio: 4.3 ratio (ref 0.0–5.0)
Cholesterol, Total: 167 mg/dL (ref 100–199)
HDL: 39 mg/dL — AB (ref >39)
LDL CALC: 106 mg/dL — AB (ref 0–99)
TRIGLYCERIDES: 111 mg/dL (ref 0–149)
VLDL CHOLESTEROL CAL: 22 mg/dL (ref 5–40)

## 2018-02-04 ENCOUNTER — Encounter: Payer: Self-pay | Admitting: Cardiology

## 2018-02-04 ENCOUNTER — Ambulatory Visit (INDEPENDENT_AMBULATORY_CARE_PROVIDER_SITE_OTHER): Payer: Medicare Other | Admitting: Cardiology

## 2018-02-04 VITALS — BP 150/68 | HR 64 | Ht 64.0 in | Wt 145.0 lb

## 2018-02-04 DIAGNOSIS — I2510 Atherosclerotic heart disease of native coronary artery without angina pectoris: Secondary | ICD-10-CM

## 2018-02-04 DIAGNOSIS — R072 Precordial pain: Secondary | ICD-10-CM

## 2018-02-04 DIAGNOSIS — E7800 Pure hypercholesterolemia, unspecified: Secondary | ICD-10-CM | POA: Diagnosis not present

## 2018-02-04 DIAGNOSIS — I10 Essential (primary) hypertension: Secondary | ICD-10-CM | POA: Diagnosis not present

## 2018-02-04 NOTE — Patient Instructions (Signed)
Medication Instructions:  NO CHANGE If you need a refill on your cardiac medications before your next appointment, please call your pharmacy.   Lab work: If you have labs (blood work) drawn today and your tests are completely normal, you will receive your results only by: Marland Kitchen MyChart Message (if you have MyChart) OR . A paper copy in the mail If you have any lab test that is abnormal or we need to change your treatment, we will call you to review the results.  Follow-Up: At [facility], you and your health needs are our priority.  As part of our continuing mission to provide you with exceptional heart care, we have created designated Provider Care Teams.  These Care Teams include your primary Cardiologist (physician) and Advanced Practice Providers (APPs -  Physician Assistants and Nurse Practitioners) who all work together to provide you with the care you need, when you need it.  Your physician recommends that you schedule a follow-up appointment in: [place] APP  Your physician recommends that you schedule a follow-up appointment in: [place]

## 2018-03-18 ENCOUNTER — Other Ambulatory Visit: Payer: Self-pay | Admitting: Cardiology

## 2018-05-15 ENCOUNTER — Telehealth: Payer: Self-pay | Admitting: *Deleted

## 2018-05-15 NOTE — Telephone Encounter (Signed)
05/15/18 LMOM @ 10:05 am, re: follow up appointment.

## 2018-06-05 ENCOUNTER — Telehealth: Payer: Self-pay | Admitting: Neurology

## 2018-06-05 NOTE — Telephone Encounter (Signed)
Daughter is calling in about needing an appt for patient. He is getting worse and she just wants for you to see him. She said they have the technology for VV. Can I schedule this? Thanks!

## 2018-06-06 NOTE — Telephone Encounter (Signed)
What is getting worse?  Was referred for tremor but didn't have tremor when we saw him so not sure that I can help.  I only see movement d/o.  They have called before with lightheadedness.

## 2018-06-06 NOTE — Telephone Encounter (Signed)
Please advise 

## 2018-06-06 NOTE — Telephone Encounter (Signed)
Left message for patient's daughter to call me back ?

## 2018-06-07 NOTE — Telephone Encounter (Signed)
Called patient daughter back No answer left message for her to return call in regards to patient health status

## 2018-06-07 NOTE — Telephone Encounter (Signed)
Attempts to call patient as well as daughter back no answer left message to call office back

## 2018-06-10 NOTE — Telephone Encounter (Signed)
Attempted to call patient/ daughter again today no answer left another message to return call  ~Dr. Tat Please be aware

## 2018-06-10 NOTE — Telephone Encounter (Signed)
[place], [place].  Can't make appt until we know what needs to be seen for.  Have tried multiple times to call without return call.

## 2018-06-11 NOTE — Telephone Encounter (Signed)
Attempted to call patient and daughter at number given. No answer left message to call office back.

## 2018-06-11 NOTE — Telephone Encounter (Signed)
Patient daughter  called and would like [place] to call her father about the pervious messages [phone]

## 2018-06-12 NOTE — Telephone Encounter (Signed)
Called spoke with patient he was informed of provider response below

## 2018-06-12 NOTE — Telephone Encounter (Signed)
Called spoke with patient he states he is dizzy notice more when setting down and getting up. Dizzy just about everyday. Ears popping   appt with you? Or PCP  Pt has not called to schedule appt with PCP at this time

## 2018-06-12 NOTE — Telephone Encounter (Signed)
I saw him for that a year ago and didn't see a neuro cause.  The only thing I saw was that his BP was really dropping and told them to f/u with other docs about meds (metoprolol, etc).  He is still on those meds so those are still my recommendations.  I didn't see any neuro issues re: dizziness last time.

## 2018-07-02 DIAGNOSIS — Z9181 History of falling: Secondary | ICD-10-CM | POA: Diagnosis not present

## 2018-07-02 DIAGNOSIS — Z79899 Other long term (current) drug therapy: Secondary | ICD-10-CM | POA: Diagnosis not present

## 2018-07-02 DIAGNOSIS — E7800 Pure hypercholesterolemia, unspecified: Secondary | ICD-10-CM | POA: Diagnosis not present

## 2018-07-02 DIAGNOSIS — G8929 Other chronic pain: Secondary | ICD-10-CM | POA: Diagnosis not present

## 2018-07-02 DIAGNOSIS — I679 Cerebrovascular disease, unspecified: Secondary | ICD-10-CM | POA: Diagnosis not present

## 2018-07-02 DIAGNOSIS — I2510 Atherosclerotic heart disease of native coronary artery without angina pectoris: Secondary | ICD-10-CM | POA: Diagnosis not present

## 2018-07-02 DIAGNOSIS — Z1331 Encounter for screening for depression: Secondary | ICD-10-CM | POA: Diagnosis not present

## 2018-07-02 DIAGNOSIS — I10 Essential (primary) hypertension: Secondary | ICD-10-CM | POA: Diagnosis not present

## 2018-07-24 DIAGNOSIS — Z20828 Contact with and (suspected) exposure to other viral communicable diseases: Secondary | ICD-10-CM | POA: Diagnosis not present

## 2018-07-24 DIAGNOSIS — Z7189 Other specified counseling: Secondary | ICD-10-CM | POA: Diagnosis not present

## 2018-11-13 DIAGNOSIS — Z23 Encounter for immunization: Secondary | ICD-10-CM | POA: Diagnosis not present

## 2018-12-23 DIAGNOSIS — Z1331 Encounter for screening for depression: Secondary | ICD-10-CM | POA: Diagnosis not present

## 2018-12-23 DIAGNOSIS — Z125 Encounter for screening for malignant neoplasm of prostate: Secondary | ICD-10-CM | POA: Diagnosis not present

## 2018-12-23 DIAGNOSIS — Z0000 Encounter for general adult medical examination without abnormal findings: Secondary | ICD-10-CM | POA: Diagnosis not present

## 2018-12-23 DIAGNOSIS — Z9181 History of falling: Secondary | ICD-10-CM | POA: Diagnosis not present

## 2018-12-23 DIAGNOSIS — Z139 Encounter for screening, unspecified: Secondary | ICD-10-CM | POA: Diagnosis not present

## 2018-12-23 DIAGNOSIS — E785 Hyperlipidemia, unspecified: Secondary | ICD-10-CM | POA: Diagnosis not present

## 2018-12-27 ENCOUNTER — Other Ambulatory Visit: Payer: Self-pay

## 2018-12-27 DIAGNOSIS — I6523 Occlusion and stenosis of bilateral carotid arteries: Secondary | ICD-10-CM

## 2018-12-30 ENCOUNTER — Encounter: Payer: Self-pay | Admitting: Family

## 2018-12-30 ENCOUNTER — Other Ambulatory Visit: Payer: Self-pay

## 2018-12-30 ENCOUNTER — Ambulatory Visit (INDEPENDENT_AMBULATORY_CARE_PROVIDER_SITE_OTHER): Payer: Medicare Other | Admitting: Family

## 2018-12-30 ENCOUNTER — Ambulatory Visit (HOSPITAL_COMMUNITY)
Admission: RE | Admit: 2018-12-30 | Discharge: 2018-12-30 | Disposition: A | Discharge status: Home / Self Care | Payer: Medicare Other | Source: Ambulatory Visit | Attending: Family | Admitting: Family

## 2018-12-30 VITALS — BP 151/73 | HR 57 | Temp 96.7°F | Resp 16 | Ht 64.0 in | Wt 137.0 lb

## 2018-12-30 DIAGNOSIS — I6523 Occlusion and stenosis of bilateral carotid arteries: Secondary | ICD-10-CM | POA: Diagnosis not present

## 2018-12-30 DIAGNOSIS — Z9889 Other specified postprocedural states: Secondary | ICD-10-CM | POA: Diagnosis not present

## 2018-12-30 DIAGNOSIS — Z720 Tobacco use: Secondary | ICD-10-CM

## 2018-12-30 NOTE — Patient Instructions (Signed)
Smokeless Tobacco Information, Adult  Tobacco is a leafy plant that contains a chemical (nicotine). Nicotine affects your brain and causes you to become addicted to it. Smokeless tobacco is tobacco that you put in your mouth instead of smoking it. It may also be called chewing tobacco or snuff. Smokeless tobacco is made from the leaves of tobacco plants and comes in many forms, such as:  Loose, dry leaves.  Plugs or twists.  Moist pouches.  Dissolving lozenges or strips. Chewing, sucking, or holding the tobacco in your mouth causes your mouth to make more saliva. The saliva mixes with the tobacco, which you swallow or spit out. Using tobacco is harmful to your health. How can smokeless tobacco affect me? All forms of tobacco contain many chemicals that can harm every organ in the body. Using smokeless tobacco increases your risk for:  Cancer. Tobacco use is one of the leading causes of cancer. Smokeless tobacco is linked to cancer of the mouth, esophagus, and pancreas.  Other long-term health problems, including high blood pressure, heart disease, and stroke.  Addiction.  Pregnancy complications, if this applies. Pregnant women who use smokeless tobacco are more likely to have a miscarriage or deliver a baby too early.  Mouth and dental problems, such as: ? Bad breath. ? Teeth that look yellow or brown. ? Mouth sores. ? Cracking and bleeding lips. ? Gum recession, gum disease, or tooth decay. ? Lesions on the soft tissues of your mouth (leukoplakia). The benefits of not using smokeless tobacco include:  A healthy mind and body.  Saving money. You avoid the cost of buying tobacco and the cost of treating illnesses that are caused by tobacco. What actions can I take to quit using tobacco? Ask your health care provider for help to quit using smokeless tobacco. This may involve treatment. These tips may also help you quit:  Pick a date to quit. Set a date within the next two  weeks. This gives you time to prepare.  Write down the reasons why you are quitting. Keep this list in places where you will see it often, such as on your bathroom mirror or in your car or wallet.  Identify the people, places, things, and activities that make you want to use smokeless tobacco (triggers). Avoid them.  Get rid of any tobacco you have and remove any tobacco smells. To do this: ? Throw away all containers of tobacco at home, at work, and in your car. ? Throw away any other items that you use regularly when you chew tobacco. ? Clean your car and make sure to remove all tobacco-related items. ? Clean your home, including curtains and carpets.  Tell your family and friends that you are quitting. Having support can make quitting easier.  Chew sugarless gum or sunflower seeds when you want to use smokeless tobacco.  Stay positive. Be prepared for cravings. It is common to slip up when you first quit, so take it one day at a time.  Stay busy and take care of your body. Get plenty of exercise. Drink enough water to keep your urine pale yellow.  Keep track of how many days have passed since you quit. Remembering how long and hard you have worked to quit can help you avoid using smokeless tobacco again. Where to find support Ask your health care provider if there is a local support group for quitting smokeless tobacco. Where to find more information You can learn more about the risks of using smokeless tobacco   and the benefits of quitting from these sources:  [place]: www.cancer.[place]: www.cancer.gov  Centers for Disease Control and Prevention: http://www.wolf.info/ Contact a health care provider if you have:  Trouble quitting smokeless tobacco use on your own.  White or other discolored patches in your mouth.  Difficulty swallowing.  A change in your voice.  Unexplained weight loss.  Stomach pain, nausea, or vomiting. Summary   Smokeless tobacco contains many different chemicals that are known to cause cancer.  Nicotine is an addictive chemical in smokeless tobacco that affects your brain.  The benefits of not using smokeless tobacco include having a healthy mind and body and saving money.  Tell your family and friends that you are quitting. Having support can make quitting easier.  Ask your health care provider for help quitting smokeless tobacco. This may involve treatment. This information is not intended to replace advice given to you by your health care provider. Make sure you discuss any questions you have with your health care provider. Document Released: 06/13/2010 Document Revised: 03/18/2018 Document Reviewed: 03/18/2018 Elsevier Patient Education  2020 [employer].     Stroke Prevention Some medical conditions and lifestyle choices can lead to a higher risk for a stroke. You can help to prevent a stroke by making nutrition, lifestyle, and other changes. What nutrition changes can be made?   Eat healthy foods. ? Choose foods that are high in fiber. These include:  Fresh fruits.  Fresh vegetables.  Whole grains. ? Eat at least 5 or more servings of fruits and vegetables each day. Try to fill half of your plate at each meal with fruits and vegetables. ? Choose lean protein foods. These include:  Lowfat (lean) cuts of meat.  Chicken without skin.  Fish.  Tofu.  Beans.  Nuts. ? Eat low-fat dairy products. ? Avoid foods that:  Are high in salt (sodium).  Have saturated fat.  Have trans fat.  Have cholesterol.  Are processed.  Are premade.  Follow eating guidelines as told by your doctor. These may include: ? Reducing how many calories you eat and drink each day. ? Limiting how much salt you eat or drink each day to 1,500 milligrams (mg). ? Using only healthy fats for cooking. These include:  Olive oil.  Canola oil.  Sunflower oil. ? Counting how many carbohydrates  you eat and drink each day. What lifestyle changes can be made?  Try to stay at a healthy weight. Talk to your doctor about what a good weight is for you.  Get at least 30 minutes of moderate physical activity at least 5 days a week. This can include: ? Fast walking. ? Biking. ? Swimming.  Do not use any products that have nicotine or tobacco. This includes cigarettes and e-cigarettes. If you need help quitting, ask your doctor. Avoid being around tobacco smoke in general.  Limit how much alcohol you drink to no more than 1 drink a day for nonpregnant women and 2 drinks a day for men. One drink equals 12 oz of beer, 5 oz of wine, or 1 oz of hard liquor.  Do not use drugs.  Avoid taking birth control pills. Talk to your doctor about the risks of taking birth control pills if: ? You are over 1 years old. ? You smoke. ? You get migraines. ? You have had a blood clot. What other changes can be made?  Manage your cholesterol. ? It is important to eat a healthy diet. ?  If your cholesterol cannot be managed through your diet, you may also need to take medicines. Take medicines as told by your doctor.  Manage your diabetes. ? It is important to eat a healthy diet and to exercise regularly. ? If your blood sugar cannot be managed through diet and exercise, you may need to take medicines. Take medicines as told by your doctor.  Control your high blood pressure (hypertension). ? Try to keep your blood pressure below 130/80. This can help lower your risk of stroke. ? It is important to eat a healthy diet and to exercise regularly. ? If your blood pressure cannot be managed through diet and exercise, you may need to take medicines. Take medicines as told by your doctor. ? Ask your doctor if you should check your blood pressure at home. ? Have your blood pressure checked every year. Do this even if your blood pressure is normal.  Talk to your doctor about getting checked for a sleep  disorder. Signs of this can include: ? Snoring a lot. ? Feeling very tired.  Take over-the-counter and prescription medicines only as told by your doctor. These may include aspirin or blood thinners (antiplatelets or anticoagulants).  Make sure that any other medical conditions you have are managed. Where to find more information  American Stroke Association: www.strokeassociation.org  National Stroke Association: www.stroke.org Get help right away if:  You have any symptoms of stroke. "BE FAST" is an easy way to remember the main warning signs: ? B - Balance. Signs are dizziness, sudden trouble walking, or loss of balance. ? E - Eyes. Signs are trouble seeing or a sudden change in how you see. ? F - Face. Signs are sudden weakness or loss of feeling of the face, or the face or eyelid drooping on one side. ? A - Arms. Signs are weakness or loss of feeling in an arm. This happens suddenly and usually on one side of the body. ? S - Speech. Signs are sudden trouble speaking, slurred speech, or trouble understanding what people say. ? T - Time. Time to call emergency services. Write down what time symptoms started.  You have other signs of stroke, such as: ? A sudden, very bad headache with no known cause. ? Feeling sick to your stomach (nausea). ? Throwing up (vomiting). ? Jerky movements you cannot control (seizure). These symptoms may represent a serious problem that is an emergency. Do not wait to see if the symptoms will go away. Get medical help right away. Call your local emergency services (911 in the [place] Do not drive yourself to the hospital. Summary  You can prevent a stroke by eating healthy, exercising, not smoking, drinking less alcohol, and treating other health problems, such as diabetes, high blood pressure, or high cholesterol.  Do not use any products that contain nicotine or tobacco, such as cigarettes and e-cigarettes.  Get help right away if you have any signs or  symptoms of a stroke. This information is not intended to replace advice given to you by your health care provider. Make sure you discuss any questions you have with your health care provider. Document Released: 07/11/2011 Document Revised: 03/07/2018 Document Reviewed: 04/12/2016 Elsevier Patient Education  2020 [employer].

## 2018-12-30 NOTE — Progress Notes (Signed)
Chief Complaint: Follow up Extracranial Carotid Artery Stenosis   History of Present Illness  [name] [name] is a 78 y.o. male with a history of left carotid endarterectomy by Dr. Kellie Simmering in 2013 for what was considered symptomatic.   He denies any amaurosis fugax, clumsiness or loss of use of an arm or leg. He denies any trouble with speech.   Pt was evaluated on 05-25-17 by Dr. Donzetta Matters and S. Rhyne PA-C. At that time velocities in the right ICA increased slightly but he remains asymptomatic and was still in the 40s and 59% window. He had bilateral lower extremity pain but palpable pulsesdo not support a vascular nature for this but we will get ABIs at next visit and have him follow-up in 6 months with repeat carotid duplex as well.  He had cardiac stents placed in August 2019; since then he no longer has chest pain, and has more energy.  He has a history of CABG in 2001.  Dr. [name] is his cardiologist.   Diabetic: no Tobacco use: uses smokeless tobacco since age 45  Pt meds include: Statin : no, states he stopped the statin due to flu like symptoms, states he feels better since not taking the statin; states he will speak with Dr. [name] at his next appointment next month re a possible PCSK9 inhibitor  ASA: yes Other anticoagulants/antiplatelets: Plavix   Past Medical History:  Diagnosis Date  . Anemia 10/06/2011  . Anginal pain ([place])   . Arthritis    "joints hurt; shoulders, arms, back" (08/13/2013)  . Atrial fibrillation ([place])   . B12 deficiency   . Carotid stenosis 04/10/11   a. s/p left carotid endarterectomy 04/14/2011.;  b.  Carotid US (11/13):  L CEA ok; RICA 1-39%  . Chronic bronchitis ([place])    "get it q yr"  . Chronic chest pain   . Chronic lower back pain   . Colon polyp    adenomatous  . Coronary artery disease    a. S/p CABG in 2001. b. S/p DES to protected LM and BMS to RCA 2006. c. 12/2009: s/p DES to Cx;  d. 09/2011 Cath: patent stents, patent grafts  -->Med Rx.;  e. CP with abnl Nuc => LHC 10/04/12: oLM 30%, dLM stent into the CFX patent, LAD occluded, LIMA-LAD patent, RI 30%, mid AVCFX 30%, oOM1 50%, oRCA stent patent, mRCA 50-60%, Radial graft-Dx patent; EF 55%.=> Med Rx.  f. stent to LM & Circ,  . Daily headache    "for the last couple weeks" (08/13/2013)  . Diverticulosis   . Esophageal stricture   . GERD (gastroesophageal reflux disease)   . Hemorrhoids   . Hiatal hernia   . Hyperlipidemia   . Hypertension   . Left bundle branch block   . Pancreatitis Dec 2011   ERCP ok.  . Prostate cancer [facility])    s/p cryoablation  . Renal cyst    Seen on CT 08/2011 also with circumferential bladder wall thickening  . Skin cancer    "cut it off my right ear"  . Statin intolerance     Social History Social History   Tobacco Use  . Smoking status: Former Smoker    Years: 0.10    Types: Cigarettes  . Smokeless tobacco: Current User    Types: Chew  . Tobacco comment: "smoked a few cigarettes; no more than 1 month"  Substance Use Topics  . Alcohol use: No    Alcohol/week: 0.0 standard drinks  Comment: "no alcohol since I was a teenager"  . Drug use: No    Family History Family History  Problem Relation Age of Onset  . Heart disease Mother        Heart Disease before age 1  . Hypertension Mother   . Heart attack Mother   . Lung cancer Father   . Hypertension Sister   . Heart disease Sister        Heart Disease before age 91  . Cancer Sister   . Heart attack Sister   . Hypertension Brother   . Heart disease Brother        Heart Disease before age 57  . Heart attack Brother   . Colon cancer Neg Hx     Surgical History Past Surgical History:  Procedure Laterality Date  . CARDIAC CATHETERIZATION    . CHOLECYSTECTOMY    . COLONOSCOPY    . CORONARY ANGIOPLASTY WITH STENT PLACEMENT     "1 + 1"  . CORONARY ARTERY BYPASS GRAFT  2001  . CORONARY STENT INTERVENTION N/A 09/11/2017   Procedure: CORONARY STENT INTERVENTION;   Surgeon: Lorretta Harp, MD;  Location: [facility];  Service: Cardiovascular;  Laterality: N/A;  . CORONARY STENT PLACEMENT  09/11/2017   Ost Cx to Prox Cx lesion is 95% stenosed.  Marland Kitchen ENDARTERECTOMY  04/14/2011   Procedure: ENDARTERECTOMY CAROTID;  Surgeon: Mal Misty, MD;  Location: [place];  Service: Vascular;  Laterality: Left;  Would like to perform procedure first, at 0730  . EYE SURGERY     cataract  . INGUINAL HERNIA REPAIR Bilateral   . LEFT HEART CATH AND CORS/GRAFTS ANGIOGRAPHY N/A 09/11/2017   Procedure: LEFT HEART CATH AND CORS/GRAFTS ANGIOGRAPHY;  Surgeon: Lorretta Harp, MD;  Location: [place] [facility];  Service: Cardiovascular;  Laterality: N/A;  . LEFT HEART CATHETERIZATION WITH CORONARY/GRAFT ANGIOGRAM N/A 10/05/2011   Procedure: LEFT HEART CATHETERIZATION WITH Beatrix Fetters;  Surgeon: Hillary Bow, MD;  Location: [facility];  Service: Cardiovascular;  Laterality: N/A;  . LEFT HEART CATHETERIZATION WITH CORONARY/GRAFT ANGIOGRAM N/A 10/04/2012   Procedure: LEFT HEART CATHETERIZATION WITH Beatrix Fetters;  Surgeon: Jolaine Artist, MD;  Location: [facility] C[facility] LAB;  Service: Cardiovascular;  Laterality: N/A;  . POLYPECTOMY    . PR VEIN BYPASS GRAFT,AORTO-FEM-POP    . PROSTATE CRYOABLATION      Allergies  Allergen Reactions  . Shellfish Allergy Anaphylaxis    Other reaction(s): Other (See Comments)  . Zolpidem Tartrate Other (See Comments)    hallucinations  . Zolpidem Tartrate     Other reaction(s): Other (See Comments) hallucinations  . Brilinta [Ticagrelor]   . Amoxicillin-Pot Clavulanate Other (See Comments)    REACTION: 'Burns' Stomach  . Amoxicillin-Pot Clavulanate     Other reaction(s): Other (See Comments) REACTION: 'Burns' Stomach  . Codeine Nausea And Vomiting  . Erythromycin Diarrhea, Nausea And Vomiting and Other (See Comments)    All mycins cause upset stomach Other reaction(s): GI Upset (intolerance), Other (See  Comments) All mycins cause upset stomach  . Hydrocodone Nausea And Vomiting  . Morphine Nausea And Vomiting  . Nitrofuran Derivatives Other (See Comments)    Unknown reaction unknown  . Oxycodone Hcl Nausea And Vomiting  . Tramadol Nausea And Vomiting    Current Outpatient Medications  Medication Sig Dispense Refill  . amLODipine (NORVASC) 10 MG tablet Take 10 mg by mouth daily.    Marland Kitchen aspirin EC 81 MG tablet Take 81 mg by  mouth at bedtime.     . clopidogrel (PLAVIX) 75 MG tablet TAKE 1 TABLET BY MOUTH EVERY DAY 90 tablet 3  . cyanocobalamin 1000 MCG tablet Take 1,000 mcg by mouth at bedtime.     . isosorbide mononitrate (IMDUR) 30 MG 24 hr tablet Take 1 tablet (30 mg total) by mouth daily. ** DO NOT CRUSH ** 90 tablet 2  . LORazepam (ATIVAN) 0.5 MG tablet Take 0.5 mg by mouth daily as needed for anxiety.  2  . metoCLOPramide (REGLAN) 10 MG tablet Take 10 mg by mouth daily as needed for nausea.    . metoprolol tartrate (LOPRESSOR) 25 MG tablet Take 25 mg by mouth 2 (two) times daily.    . nitroGLYCERIN (NITROSTAT) 0.4 MG SL tablet Place 1 tablet (0.4 mg total) under the tongue every 5 (five) minutes as needed. For chest pain 25 tablet 11  . pantoprazole (PROTONIX) 40 MG tablet Take 1 tablet (40 mg total) by mouth daily. 30 tablet 1  . PARoxetine (PAXIL) 20 MG tablet Take 20 mg by mouth at bedtime.   5  . Simethicone 125 MG CAPS Take 1 capsule (125 mg total) by mouth daily as needed. (Patient taking differently: Take 125 mg by mouth daily as needed (gas). ) 30 each 11  . atorvastatin (LIPITOR) 40 MG tablet Take 1 tablet (40 mg total) by mouth daily at 6 PM. (Patient not taking: Reported on 12/30/2018) 90 tablet 3  . ranitidine (ZANTAC) 150 MG tablet Take 150 mg by mouth 2 (two) times daily.     No current facility-administered medications for this visit.     Review of Systems : See HPI for pertinent positives and negatives.  Physical Examination  Vitals:   12/30/18 1330 12/30/18 1333   BP: (!) 168/76 (!) 151/73  Pulse: (!) 57 (!) 57  Resp: 16   Temp: (!) 96.7 F (35.9 C)   TempSrc: Temporal   SpO2: 99%   Weight: 137 lb (62.1 kg)   Height: 5\' 4"  (1.626 m)    Body mass index is 23.52 kg/m.  General: WDWN small elderly male in NAD, accompanied by his daughter GAIT: normal Eyes: Pupils are equal HENT: No gross abnormalities.  Pulmonary:  Respirations are non-labored, good air movement in all fields, no rales, rhonchi, or wheezes Cardiac: regular rhythm, no detected murmur.  VASCULAR EXAM Carotid Bruits Right Left   Negative Negative     Abdominal aortic pulse is not palpable. Radial pulses are 2+ palpable and equal.                                                                                                                            LE Pulses Right Left       FEMORAL  2+ palpable  2+ palpable        POPLITEAL  2+ palpable   1+ palpable       POSTERIOR TIBIAL  not palpable   3+ palpable  DORSALIS PEDIS      ANTERIOR TIBIAL 3+ palpable  not palpable     Gastrointestinal: soft, nontender, BS WNL, no r/g, no palpable masses. Musculoskeletal: no muscle atrophy/wasting. M/S 5/5 throughout, extremities without ischemic changes Skin: No rashes, no ulcers, no cellulitis.   Neurologic:  A&O X 3; appropriate affect, sensation is normal; speech is normal, CN 2-12 intact, pain and light touch intact in extremities, motor exam as listed above. Psychiatric: Normal thought content, mood appropriate to clinical situation.    DATA  Carotid Duplex (12-30-18): Right Carotid: Velocities in the right ICA are consistent with a 40-59% stenosis. Left Carotid: Velocities in the left ICA are consistent with a 1-39% stenosis. Vertebrals:  Bilateral vertebral arteries demonstrate antegrade flow. Subclavians: Normal flow hemodynamics were seen in bilateral subclavian arteries. No change compared to the exams on 05-25-17 and 11-23-17.   ABI (Date:  11/23/2017):  R:  ? ABI: 1.06 (no previous for comparison),  ? PT: bi ? DP: tri ? TBI:  0.76, toe pressure 122  L:  ? ABI: 1.04,  ? PT: tri ? DP: tri ? TBI: 0.81, toe pressure 130 Normal bilateral ABI and TBI with tri and biphasic waveforms.    Assessment: [name] [name] is a 78 y.o. male with a history of left carotid endarterectomy by Dr. Kellie Simmering in 2013 for what was considered symptomatic.  Pt states he was told that he had a stroke at some time in the past, states he does not recall any stroke sx's, has no neurological deficits.   Carotid duplex today shows 40-59% right ICA stenosis and 1-39% left ICA stenosis; stable compared to the exams on 05-25-17 and 11-23-17.   His pedal pulses are palpable bilaterally, no signs of ischemia in his feet or legs.   His atherosclerotic risk factors include smokeless tobacco use since age 19, CAD, and statin intolerance.  Fortunately he does not have DM.     Plan: Follow-up in 1 year with Carotid Duplex scan.  Will not repeat ABI's unless he develops signs or symptoms suspicious for PAD.   I discussed in depth with the patient the nature of atherosclerosis, and emphasized the importance of maximal medical management including strict control of blood pressure, blood glucose, and lipid levels, obtaining regular exercise, and cessation of smokeless tobacco use  The patient is aware that without maximal medical management the underlying atherosclerotic disease process will progress, limiting the benefit of any interventions. The patient was given information about stroke prevention and what symptoms should prompt the patient to seek immediate medical care. Thank you for allowing [place] to participate in this patient's care.  Clemon Chambers, RN, MSN, FNP-C [facility] Office: [facility] Physician: Trula Slade  12/30/18 2:13 PM

## 2019-01-06 DIAGNOSIS — G8929 Other chronic pain: Secondary | ICD-10-CM | POA: Diagnosis not present

## 2019-01-06 DIAGNOSIS — Z79899 Other long term (current) drug therapy: Secondary | ICD-10-CM | POA: Diagnosis not present

## 2019-01-06 DIAGNOSIS — I679 Cerebrovascular disease, unspecified: Secondary | ICD-10-CM | POA: Diagnosis not present

## 2019-01-06 DIAGNOSIS — Z125 Encounter for screening for malignant neoplasm of prostate: Secondary | ICD-10-CM | POA: Diagnosis not present

## 2019-01-06 DIAGNOSIS — R109 Unspecified abdominal pain: Secondary | ICD-10-CM | POA: Diagnosis not present

## 2019-01-06 DIAGNOSIS — I25119 Atherosclerotic heart disease of native coronary artery with unspecified angina pectoris: Secondary | ICD-10-CM | POA: Diagnosis not present

## 2019-01-06 DIAGNOSIS — E7800 Pure hypercholesterolemia, unspecified: Secondary | ICD-10-CM | POA: Diagnosis not present

## 2019-01-20 ENCOUNTER — Other Ambulatory Visit: Payer: Self-pay | Admitting: *Deleted

## 2019-01-20 DIAGNOSIS — I6523 Occlusion and stenosis of bilateral carotid arteries: Secondary | ICD-10-CM

## 2019-02-14 NOTE — Progress Notes (Deleted)
HPI: FU CAD and chronic CP. S/P CABG 2001; multiple PCI since. Abd ultrasound 8/18 showed no aneurysm. Last cath 8/19 60 RCA, 100 LAD, 95 LM, 95 Lcx; patent radial graft to ramus, patent LIMA to LAD; had PCI of LM and Lcx. Last echo 8/19 showed normal LV function, grade 2 DD, mild LAE, trace AI and MR. ABIs 11/19 normal. Carotid dopplers 12/20 showed 40-59 right and 1-39 left. Vascular disease followed by vascular surgery. Since last seen,   Current Outpatient Medications  Medication Sig Dispense Refill  . amLODipine (NORVASC) 10 MG tablet Take 10 mg by mouth daily.    Marland Kitchen aspirin EC 81 MG tablet Take 81 mg by mouth at bedtime.     Marland Kitchen atorvastatin (LIPITOR) 40 MG tablet Take 1 tablet (40 mg total) by mouth daily at 6 PM. (Patient not taking: Reported on 12/30/2018) 90 tablet 3  . clopidogrel (PLAVIX) 75 MG tablet TAKE 1 TABLET BY MOUTH EVERY DAY 90 tablet 3  . cyanocobalamin 1000 MCG tablet Take 1,000 mcg by mouth at bedtime.     . isosorbide mononitrate (IMDUR) 30 MG 24 hr tablet Take 1 tablet (30 mg total) by mouth daily. ** DO NOT CRUSH ** 90 tablet 2  . LORazepam (ATIVAN) 0.5 MG tablet Take 0.5 mg by mouth daily as needed for anxiety.  2  . metoCLOPramide (REGLAN) 10 MG tablet Take 10 mg by mouth daily as needed for nausea.    . metoprolol tartrate (LOPRESSOR) 25 MG tablet Take 25 mg by mouth 2 (two) times daily.    . nitroGLYCERIN (NITROSTAT) 0.4 MG SL tablet Place 1 tablet (0.4 mg total) under the tongue every 5 (five) minutes as needed. For chest pain 25 tablet 11  . pantoprazole (PROTONIX) 40 MG tablet Take 1 tablet (40 mg total) by mouth daily. 30 tablet 1  . PARoxetine (PAXIL) 20 MG tablet Take 20 mg by mouth at bedtime.   5  . ranitidine (ZANTAC) 150 MG tablet Take 150 mg by mouth 2 (two) times daily.    . Simethicone 125 MG CAPS Take 1 capsule (125 mg total) by mouth daily as needed. (Patient taking differently: Take 125 mg by mouth daily as needed (gas). ) 30 each 11   No  current facility-administered medications for this visit.     Past Medical History:  Diagnosis Date  . Anemia 10/06/2011  . Anginal pain ([place])   . Arthritis    "joints hurt; shoulders, arms, back" (08/13/2013)  . Atrial fibrillation ([place])   . B12 deficiency   . Carotid stenosis 04/10/11   a. s/p left carotid endarterectomy 04/14/2011.;  b.  Carotid US (11/13):  L CEA ok; RICA 1-39%  . Chronic bronchitis ([place])    "get it q yr"  . Chronic chest pain   . Chronic lower back pain   . Colon polyp    adenomatous  . Coronary artery disease    a. S/p CABG in 2001. b. S/p DES to protected LM and BMS to RCA 2006. c. 12/2009: s/p DES to Cx;  d. 09/2011 Cath: patent stents, patent grafts -->Med Rx.;  e. CP with abnl Nuc => LHC 10/04/12: oLM 30%, dLM stent into the CFX patent, LAD occluded, LIMA-LAD patent, RI 30%, mid AVCFX 30%, oOM1 50%, oRCA stent patent, mRCA 50-60%, Radial graft-Dx patent; EF 55%.=> Med Rx.  f. stent to LM & Circ,  . Daily headache    "for the last couple weeks" (08/13/2013)  .  Diverticulosis   . Esophageal stricture   . GERD (gastroesophageal reflux disease)   . Hemorrhoids   . Hiatal hernia   . Hyperlipidemia   . Hypertension   . Left bundle branch block   . Pancreatitis Dec 2011   ERCP ok.  . Prostate cancer [facility])    s/p cryoablation  . Renal cyst    Seen on CT 08/2011 also with circumferential bladder wall thickening  . Skin cancer    "cut it off my right ear"  . Statin intolerance     Past Surgical History:  Procedure Laterality Date  . CARDIAC CATHETERIZATION    . CHOLECYSTECTOMY    . COLONOSCOPY    . CORONARY ANGIOPLASTY WITH STENT PLACEMENT     "1 + 1"  . CORONARY ARTERY BYPASS GRAFT  2001  . CORONARY STENT INTERVENTION N/A 09/11/2017   Procedure: CORONARY STENT INTERVENTION;  Surgeon: Lorretta Harp, MD;  Location: [place] [facility];  Service: Cardiovascular;  Laterality: N/A;  . CORONARY STENT PLACEMENT  09/11/2017   Ost Cx to Prox Cx lesion is 95%  stenosed.  Marland Kitchen ENDARTERECTOMY  04/14/2011   Procedure: ENDARTERECTOMY CAROTID;  Surgeon: Mal Misty, MD;  Location: [place];  Service: Vascular;  Laterality: Left;  Would like to perform procedure first, at 0730  . EYE SURGERY     cataract  . INGUINAL HERNIA REPAIR Bilateral   . LEFT HEART CATH AND CORS/GRAFTS ANGIOGRAPHY N/A 09/11/2017   Procedure: LEFT HEART CATH AND CORS/GRAFTS ANGIOGRAPHY;  Surgeon: Lorretta Harp, MD;  Location: [place] [facility];  Service: Cardiovascular;  Laterality: N/A;  . LEFT HEART CATHETERIZATION WITH CORONARY/GRAFT ANGIOGRAM N/A 10/05/2011   Procedure: LEFT HEART CATHETERIZATION WITH Beatrix Fetters;  Surgeon: Hillary Bow, MD;  Location: [facility] C[facility];  Service: Cardiovascular;  Laterality: N/A;  . LEFT HEART CATHETERIZATION WITH CORONARY/GRAFT ANGIOGRAM N/A 10/04/2012   Procedure: LEFT HEART CATHETERIZATION WITH Beatrix Fetters;  Surgeon: Jolaine Artist, MD;  Location: [facility] CATH LAB;  Service: Cardiovascular;  Laterality: N/A;  . POLYPECTOMY    . PR VEIN BYPASS GRAFT,AORTO-FEM-POP    . PROSTATE CRYOABLATION      Social History   Socioeconomic History  . Marital status: Widowed    Spouse name: Not on file  . Number of children: 2  . Years of education: Not on file  . Highest education level: Not on file  Occupational History  . Occupation: Retired    Comment: raised Sales promotion account executive  Tobacco Use  . Smoking status: Former Smoker    Years: 0.10    Types: Cigarettes  . Smokeless tobacco: Current User    Types: Chew  . Tobacco comment: "smoked a few cigarettes; no more than 1 month"  Substance and Sexual Activity  . Alcohol use: No    Alcohol/week: 0.0 standard drinks    Comment: "no alcohol since I was a teenager"  . Drug use: No  . Sexual activity: Not Currently  Other Topics Concern  . Not on file  Social History Narrative   Patient is illiterate. He cannot read or write. He left school at about the seventh grade.   As of  10/2015 he reports that his wife is chronically ill at home with heart disease and COPD and is under hospice care. The bulk of the care is given by the patient and daughter.   Social Determinants of Health   Financial Resource Strain:   . Difficulty of Paying Living Expenses: Not on file  Food Insecurity:   . Worried About Charity fundraiser in the Last Year: Not on file  . Ran Out of Food in the Last Year: Not on file  Transportation Needs:   . Lack of Transportation (Medical): Not on file  . Lack of Transportation (Non-Medical): Not on file  Physical Activity:   . Days of Exercise per Week: Not on file  . Minutes of Exercise per Session: Not on file  Stress:   . Feeling of Stress : Not on file  Social Connections:   . Frequency of Communication with Friends and Family: Not on file  . Frequency of Social Gatherings with Friends and Family: Not on file  . Attends Religious Services: Not on file  . Active Member of Clubs or Organizations: Not on file  . Attends Archivist Meetings: Not on file  . Marital Status: Not on file  Intimate Partner Violence:   . Fear of Current or Ex-Partner: Not on file  . Emotionally Abused: Not on file  . Physically Abused: Not on file  . Sexually Abused: Not on file    Family History  Problem Relation Age of Onset  . Heart disease Mother        Heart Disease before age 45  . Hypertension Mother   . Heart attack Mother   . Lung cancer Father   . Hypertension Sister   . Heart disease Sister        Heart Disease before age 33  . Cancer Sister   . Heart attack Sister   . Hypertension Brother   . Heart disease Brother        Heart Disease before age 34  . Heart attack Brother   . Colon cancer Neg Hx     ROS: no fevers or chills, productive cough, hemoptysis, dysphasia, odynophagia, melena, hematochezia, dysuria, hematuria, rash, seizure activity, orthopnea, PND, pedal edema, claudication. Remaining systems are negative.  Physical  Exam: Well-developed well-nourished in no acute distress.  Skin is warm and dry.  HEENT is normal.  Neck is supple.  Chest is clear to auscultation with normal expansion.  Cardiovascular exam is regular rate and rhythm.  Abdominal exam nontender or distended. No masses palpated. Extremities show no edema. neuro grossly intact  ECG- personally reviewed  A/P  1 CAD-pt denies CP; continue medical therapy with ASA, plavix and statin.  2 Carotid artery disease-followed by vascular surgery; continue medical therapy; will need fu dopplers 12/21.  3 hypertension-BP controlled; continue present regimen.  4 hyperlipidemia- continue statin.  5 CP-as outlined previously, very difficult situation; pt has chronic CP but with underlying CAD. ECG not helpful in setting of LBBB. Will arrange lexiscan nuclear study to further assess.  Kirk Ruths, MD

## 2019-02-18 ENCOUNTER — Ambulatory Visit: Payer: Medicare Other | Admitting: Cardiology

## 2019-03-11 ENCOUNTER — Other Ambulatory Visit: Payer: Self-pay | Admitting: Cardiology

## 2019-04-14 ENCOUNTER — Other Ambulatory Visit: Payer: Self-pay | Admitting: Cardiology

## 2019-04-16 ENCOUNTER — Other Ambulatory Visit: Payer: Self-pay | Admitting: Cardiology

## 2019-04-23 ENCOUNTER — Other Ambulatory Visit: Payer: Self-pay | Admitting: Cardiology

## 2019-05-07 ENCOUNTER — Other Ambulatory Visit: Payer: Self-pay | Admitting: Cardiology

## 2019-05-22 ENCOUNTER — Encounter: Payer: Self-pay | Admitting: Internal Medicine

## 2019-05-22 ENCOUNTER — Ambulatory Visit (INDEPENDENT_AMBULATORY_CARE_PROVIDER_SITE_OTHER): Payer: Medicare Other | Admitting: Internal Medicine

## 2019-05-22 VITALS — BP 142/68 | HR 60 | Temp 97.9°F | Ht 64.0 in | Wt 140.1 lb

## 2019-05-22 DIAGNOSIS — Z86010 Personal history of colonic polyps: Secondary | ICD-10-CM

## 2019-05-22 DIAGNOSIS — Z789 Other specified health status: Secondary | ICD-10-CM

## 2019-05-22 DIAGNOSIS — Z01818 Encounter for other preprocedural examination: Secondary | ICD-10-CM

## 2019-05-22 MED ORDER — NA SULFATE-K SULFATE-MG SULF 17.5-3.13-1.6 GM/177ML PO SOLN: 1.0000 | Freq: Once | ORAL | 0 refills | Status: AC

## 2019-05-22 NOTE — Patient Instructions (Signed)
You have been scheduled for a colonoscopy. Please follow written instructions given to you at your visit today.  Please pick up your prep supplies at the pharmacy within the next 1-3 days. If you use inhalers (even only as needed), please bring them with you on the day of your procedure.   

## 2019-05-22 NOTE — Progress Notes (Signed)
HISTORY OF PRESENT ILLNESS:  [name] [name] is a 79 y.o. male with multiple medical problems including coronary artery disease with prior prior CABG and subsequent stent placement on Plavix.  Patient has been followed in this office for GERD complicated by peptic stricture requiring esophageal dilation.  As well history of multiple adenomatous colon polyps.  He presents today, with his daughter, regarding surveillance colonoscopy.  Patient has had previous colonoscopy 2002, 2005, 2010, and 2016.  Each time with adenomatous colon polyps.  He is also noted to have moderate diverticulosis of the sigmoid colon.  Patient tells me that he is been doing well from a GI standpoint.  He is compliant with PPI therapy.  No dysphagia.  Currently on pantoprazole.  Also tells me that he has been doing well from a cardiac standpoint.  He is due for his routine annual checkup in the near future.  He was last seen in this office August 22, 2016.  See that dictation.  His last upper endoscopy was 2016.  He has not received his Covid vaccinations.  REVIEW OF SYSTEMS:  All non-GI ROS negative unless otherwise stated in the HPI except for anxiety, arthritis, back pain, hearing problems, cough, urinary leakage, urinary frequency, ankle swelling  Past Medical History:  Diagnosis Date  . Anemia 10/06/2011  . Anginal pain ([place])   . Arthritis    "joints hurt; shoulders, arms, back" (08/13/2013)  . Atrial fibrillation ([place])   . B12 deficiency   . Carotid stenosis 04/10/11   a. s/p left carotid endarterectomy 04/14/2011.;  b.  Carotid US (11/13):  L CEA ok; RICA 1-39%  . Chronic bronchitis ([place])    "get it q yr"  . Chronic chest pain   . Chronic lower back pain   . Colon polyp    adenomatous  . Coronary artery disease    a. S/p CABG in 2001. b. S/p DES to protected LM and BMS to RCA 2006. c. 12/2009: s/p DES to Cx;  d. 09/2011 Cath: patent stents, patent grafts -->Med Rx.;  e. CP with abnl Nuc => LHC 10/04/12: oLM 30%, dLM  stent into the CFX patent, LAD occluded, LIMA-LAD patent, RI 30%, mid AVCFX 30%, oOM1 50%, oRCA stent patent, mRCA 50-60%, Radial graft-Dx patent; EF 55%.=> Med Rx.  f. stent to LM & Circ,  . Daily headache    "for the last couple weeks" (08/13/2013)  . Diverticulosis   . Esophageal stricture   . GERD (gastroesophageal reflux disease)   . Hemorrhoids   . Hiatal hernia   . Hyperlipidemia   . Hypertension   . Left bundle branch block   . Pancreatitis Dec 2011   ERCP ok.  . Prostate cancer [facility])    s/p cryoablation  . Renal cyst    Seen on CT 08/2011 also with circumferential bladder wall thickening  . Skin cancer    "cut it off my right ear"  . Statin intolerance     Past Surgical History:  Procedure Laterality Date  . CARDIAC CATHETERIZATION    . CHOLECYSTECTOMY    . COLONOSCOPY    . CORONARY ANGIOPLASTY WITH STENT PLACEMENT     "1 + 1"  . CORONARY ARTERY BYPASS GRAFT  2001  . CORONARY STENT INTERVENTION N/A 09/11/2017   Procedure: CORONARY STENT INTERVENTION;  Surgeon: Lorretta Harp, MD;  Location: [place] [facility];  Service: Cardiovascular;  Laterality: N/A;  . CORONARY STENT PLACEMENT  09/11/2017   Ost Cx to Prox Cx lesion  is 95% stenosed.  Marland Kitchen ENDARTERECTOMY  04/14/2011   Procedure: ENDARTERECTOMY CAROTID;  Surgeon: Mal Misty, MD;  Location: [place];  Service: Vascular;  Laterality: Left;  Would like to perform procedure first, at 0730  . EYE SURGERY     cataract  . INGUINAL HERNIA REPAIR Bilateral   . LEFT HEART CATH AND CORS/GRAFTS ANGIOGRAPHY N/A 09/11/2017   Procedure: LEFT HEART CATH AND CORS/GRAFTS ANGIOGRAPHY;  Surgeon: Lorretta Harp, MD;  Location: [place] [facility] LAB;  Service: Cardiovascular;  Laterality: N/A;  . LEFT HEART CATHETERIZATION WITH CORONARY/GRAFT ANGIOGRAM N/A 10/05/2011   Procedure: LEFT HEART CATHETERIZATION WITH Beatrix Fetters;  Surgeon: Hillary Bow, MD;  Location: [facility] CATH LAB;  Service: Cardiovascular;  Laterality: N/A;  .  LEFT HEART CATHETERIZATION WITH CORONARY/GRAFT ANGIOGRAM N/A 10/04/2012   Procedure: LEFT HEART CATHETERIZATION WITH Beatrix Fetters;  Surgeon: Jolaine Artist, MD;  Location: [facility] CATH LAB;  Service: Cardiovascular;  Laterality: N/A;  . POLYPECTOMY    . PR VEIN BYPASS GRAFT,AORTO-FEM-POP    . PROSTATE CRYOABLATION      Social History AREON SCHWEISS  reports that he has quit smoking. His smoking use included cigarettes. He quit after 0.10 years of use. His smokeless tobacco use includes chew. He reports that he does not drink alcohol or use drugs.  family history includes Cancer in his sister; Heart attack in his brother, mother, and sister; Heart disease in his brother, mother, and sister; Hypertension in his brother, mother, and sister; Lung cancer in his father.  Allergies  Allergen Reactions  . Shellfish Allergy Anaphylaxis    Other reaction(s): Other (See Comments)  . Zolpidem Tartrate Other (See Comments)    hallucinations  . Zolpidem Tartrate     Other reaction(s): Other (See Comments) hallucinations  . Brilinta [Ticagrelor]   . Amoxicillin-Pot Clavulanate Other (See Comments)    REACTION: 'Burns' Stomach  . Amoxicillin-Pot Clavulanate     Other reaction(s): Other (See Comments) REACTION: 'Burns' Stomach  . Codeine Nausea And Vomiting  . Erythromycin Diarrhea, Nausea And Vomiting and Other (See Comments)    All mycins cause upset stomach Other reaction(s): GI Upset (intolerance), Other (See Comments) All mycins cause upset stomach  . Hydrocodone Nausea And Vomiting  . Morphine Nausea And Vomiting  . Nitrofuran Derivatives Other (See Comments)    Unknown reaction unknown  . Oxycodone Hcl Nausea And Vomiting  . Tramadol Nausea And Vomiting       PHYSICAL EXAMINATION: Vital signs: BP (!) 142/68 (BP Location: Left Arm, Patient Position: Sitting, Cuff Size: Normal)   Pulse 60   Temp 97.9 F (36.6 C)   Ht 5\' 4"  (1.626 m)   Wt 140 lb 2 oz (63.6 kg)   SpO2  98%   BMI 24.05 kg/m   Constitutional: generally well-appearing, no acute distress Psychiatric: alert and oriented x3, cooperative Eyes: extraocular movements intact, anicteric, conjunctiva pink Mouth: oral pharynx moist, no lesions Neck: supple no lymphadenopathy Cardiovascular: heart regular rate and rhythm, no murmur Lungs: clear to auscultation bilaterally Abdomen: soft, nontender, nondistended, no obvious ascites, no peritoneal signs, normal bowel sounds, no organomegaly Rectal: Deferred until colonoscopy Extremities: no clubbing, cyanosis, or lower extremity edema bilaterally Skin: no lesions on visible extremities Neuro: No focal deficits.  Cranial nerves intact  ASSESSMENT:  1.  History of multiple adenomatous colon polyps.  Due for surveillance.  Clinically stable 2.  Multiple medical problems including coronary artery disease on Plavix 3.  GERD complicated by peptic stricture.  Asymptomatic  post dilation on PPI   PLAN:  1.  Schedule surveillance colonoscopy.  The patient is HIGH RISK given his comorbidities and the need to address Plavix therapy.The nature of the procedure, as well as the risks, benefits, and alternatives were carefully and thoroughly reviewed with the patient. Ample time for discussion and questions allowed. The patient understood, was satisfied, and agreed to proceed. 2.  Hold Plavix 5 to 7 days prior to his procedure if okay with his cardiologist Dr. [name].  We would continue him on aspirin throughout.  Would anticipate resumption of Plavix immediately post procedure. 3.  Reflux precautions 4.  Continue PPI

## 2019-05-23 ENCOUNTER — Telehealth: Payer: Self-pay

## 2019-05-23 NOTE — Telephone Encounter (Signed)
[facility] Pre-operative Risk Assessment     Request for surgical clearance:     Endoscopy Procedure  What type of surgery is being performed?    Colonoscopy  When is this surgery scheduled?     /6  What type of clearance is required ?   Pharmacy  Are there any medications that need to be held prior to surgery and how long?  Plavix - 5 days  Practice name and name of physician performing surgery?      [facility]  What is your office phone and fax number?      Phone- [phone]  [phone]  Anesthesia type (None, local, MAC, general) ?       MAC

## 2019-05-24 NOTE — Telephone Encounter (Signed)
Ok to DC plavix permanently; must continue ASA 81 mg daily [name] [name]

## 2019-05-26 NOTE — Telephone Encounter (Signed)
   Primary Cardiologist: Kirk Ruths, MD  Chart reviewed as part of pre-operative protocol coverage. Left voicemail for patient to call back.  Reino Bellis, NP 05/26/2019, 10:57 AM

## 2019-05-27 NOTE — Telephone Encounter (Signed)
Called [name] daughter as we were unable to get in touch with the patient.  I left a VM on her cell number and her home number to call us back or to have [name] call us back to let him know to stop the Plavix. Awaiting return phone call.   KL

## 2019-05-28 NOTE — Telephone Encounter (Signed)
   Primary Cardiologist:Brian [name], MD  Chart reviewed as part of pre-operative protocol coverage. Talked with Mr. Mikel daughter (DPR) on file. She reports he has had some episodes of intermittent chest pain from time to time, but overall stable. Given his last appt was over a year ago, would favor office visit prior to procedure. I discussed this with her. Also informed of recommendations regarding possibly stopping plavix. Given he has had some chest pain, recommended that he continue on plavix until seen in the office.   Of note, date of procedure is not listed, but daughter thinks its scheduled for 6/17.  Pre-op covering staff: - Please schedule appointment (ok to schedule with APP) and call patient to inform them. - Please contact requesting surgeon's office via preferred method (i.e, phone, fax) to inform them of need for appointment prior to surgery.  Reino Bellis, NP  05/28/2019, 10:23 AM

## 2019-05-30 NOTE — Telephone Encounter (Addendum)
Spoke with pt ho states that he probably won't get a colonoscopy in June and will not need to schedule an appt with cardiology right now. Pt states he will call back when he plans to do his colonoscopy in the future. Pt verbalized understanding and thanked me for the call.

## 2019-06-09 ENCOUNTER — Telehealth: Payer: Self-pay

## 2019-06-09 ENCOUNTER — Other Ambulatory Visit: Payer: Self-pay | Admitting: Cardiology

## 2019-06-09 NOTE — Telephone Encounter (Signed)
Saw note from cardiology that said patient told them he would not be having his colonoscopy in June and would call back to reschedule his cardiology appointment when he was ready.  I called patient and left message for him to clarify that he did, in fact, want me to cancel his procedure

## 2019-06-10 NOTE — Telephone Encounter (Signed)
Lm with daughter regarding whether to cancel colonoscopy

## 2019-06-11 NOTE — Telephone Encounter (Signed)
Patient's daughter called back confirmed patient wants to cancel colonoscopy. She said he remembered he had one done about 3 yrs ago.

## 2019-06-12 NOTE — Telephone Encounter (Signed)
Procedure cancelled.

## 2019-07-07 ENCOUNTER — Encounter: Payer: Medicare Other | Admitting: Internal Medicine

## 2019-07-09 DIAGNOSIS — I10 Essential (primary) hypertension: Secondary | ICD-10-CM | POA: Diagnosis not present

## 2019-07-09 DIAGNOSIS — I25119 Atherosclerotic heart disease of native coronary artery with unspecified angina pectoris: Secondary | ICD-10-CM | POA: Diagnosis not present

## 2019-07-09 DIAGNOSIS — I679 Cerebrovascular disease, unspecified: Secondary | ICD-10-CM | POA: Diagnosis not present

## 2019-07-09 DIAGNOSIS — E7800 Pure hypercholesterolemia, unspecified: Secondary | ICD-10-CM | POA: Diagnosis not present

## 2019-07-09 DIAGNOSIS — I739 Peripheral vascular disease, unspecified: Secondary | ICD-10-CM | POA: Diagnosis not present

## 2019-07-09 DIAGNOSIS — Z79899 Other long term (current) drug therapy: Secondary | ICD-10-CM | POA: Diagnosis not present

## 2019-07-09 DIAGNOSIS — Z1331 Encounter for screening for depression: Secondary | ICD-10-CM | POA: Diagnosis not present

## 2019-07-10 ENCOUNTER — Other Ambulatory Visit: Payer: Self-pay | Admitting: Cardiology

## 2019-07-17 ENCOUNTER — Encounter: Payer: Medicare Other | Admitting: Internal Medicine

## 2019-08-07 ENCOUNTER — Other Ambulatory Visit: Payer: Self-pay | Admitting: Cardiology

## 2019-08-20 ENCOUNTER — Other Ambulatory Visit: Payer: Self-pay | Admitting: Cardiology

## 2019-09-03 ENCOUNTER — Other Ambulatory Visit: Payer: Self-pay | Admitting: Cardiology

## 2019-09-17 DIAGNOSIS — Z20828 Contact with and (suspected) exposure to other viral communicable diseases: Secondary | ICD-10-CM | POA: Diagnosis not present

## 2019-09-17 DIAGNOSIS — A09 Infectious gastroenteritis and colitis, unspecified: Secondary | ICD-10-CM | POA: Diagnosis not present

## 2019-09-17 DIAGNOSIS — R197 Diarrhea, unspecified: Secondary | ICD-10-CM | POA: Diagnosis not present

## 2019-09-19 DIAGNOSIS — I700 Atherosclerosis of aorta: Secondary | ICD-10-CM | POA: Diagnosis not present

## 2019-09-19 DIAGNOSIS — N281 Cyst of kidney, acquired: Secondary | ICD-10-CM | POA: Diagnosis not present

## 2019-09-19 DIAGNOSIS — R109 Unspecified abdominal pain: Secondary | ICD-10-CM | POA: Diagnosis not present

## 2019-09-19 DIAGNOSIS — N170 Acute kidney failure with tubular necrosis: Secondary | ICD-10-CM | POA: Diagnosis not present

## 2019-09-19 DIAGNOSIS — D62 Acute posthemorrhagic anemia: Secondary | ICD-10-CM | POA: Diagnosis present

## 2019-09-19 DIAGNOSIS — Z955 Presence of coronary angioplasty implant and graft: Secondary | ICD-10-CM | POA: Diagnosis not present

## 2019-09-19 DIAGNOSIS — Z23 Encounter for immunization: Secondary | ICD-10-CM | POA: Diagnosis not present

## 2019-09-19 DIAGNOSIS — E872 Acidosis: Secondary | ICD-10-CM | POA: Diagnosis not present

## 2019-09-19 DIAGNOSIS — Z7982 Long term (current) use of aspirin: Secondary | ICD-10-CM | POA: Diagnosis not present

## 2019-09-19 DIAGNOSIS — Z91013 Allergy to seafood: Secondary | ICD-10-CM | POA: Diagnosis not present

## 2019-09-19 DIAGNOSIS — N200 Calculus of kidney: Secondary | ICD-10-CM | POA: Diagnosis not present

## 2019-09-19 DIAGNOSIS — I2510 Atherosclerotic heart disease of native coronary artery without angina pectoris: Secondary | ICD-10-CM | POA: Diagnosis not present

## 2019-09-19 DIAGNOSIS — R1032 Left lower quadrant pain: Secondary | ICD-10-CM | POA: Diagnosis not present

## 2019-09-19 DIAGNOSIS — N183 Chronic kidney disease, stage 3 unspecified: Secondary | ICD-10-CM | POA: Diagnosis present

## 2019-09-19 DIAGNOSIS — Z87891 Personal history of nicotine dependence: Secondary | ICD-10-CM | POA: Diagnosis not present

## 2019-09-19 DIAGNOSIS — I129 Hypertensive chronic kidney disease with stage 1 through stage 4 chronic kidney disease, or unspecified chronic kidney disease: Secondary | ICD-10-CM | POA: Diagnosis present

## 2019-09-19 DIAGNOSIS — A09 Infectious gastroenteritis and colitis, unspecified: Secondary | ICD-10-CM | POA: Diagnosis not present

## 2019-09-19 DIAGNOSIS — Z881 Allergy status to other antibiotic agents status: Secondary | ICD-10-CM | POA: Diagnosis not present

## 2019-09-19 DIAGNOSIS — Z885 Allergy status to narcotic agent status: Secondary | ICD-10-CM | POA: Diagnosis not present

## 2019-09-19 DIAGNOSIS — R739 Hyperglycemia, unspecified: Secondary | ICD-10-CM | POA: Diagnosis present

## 2019-09-19 DIAGNOSIS — N179 Acute kidney failure, unspecified: Secondary | ICD-10-CM | POA: Diagnosis not present

## 2019-09-19 DIAGNOSIS — E860 Dehydration: Secondary | ICD-10-CM | POA: Diagnosis present

## 2019-09-19 DIAGNOSIS — K5730 Diverticulosis of large intestine without perforation or abscess without bleeding: Secondary | ICD-10-CM | POA: Diagnosis not present

## 2019-09-19 DIAGNOSIS — K529 Noninfective gastroenteritis and colitis, unspecified: Secondary | ICD-10-CM | POA: Diagnosis not present

## 2019-09-19 DIAGNOSIS — R1031 Right lower quadrant pain: Secondary | ICD-10-CM | POA: Diagnosis not present

## 2019-09-19 DIAGNOSIS — Z8546 Personal history of malignant neoplasm of prostate: Secondary | ICD-10-CM | POA: Diagnosis not present

## 2019-09-19 DIAGNOSIS — Z888 Allergy status to other drugs, medicaments and biological substances status: Secondary | ICD-10-CM | POA: Diagnosis not present

## 2019-09-19 DIAGNOSIS — I252 Old myocardial infarction: Secondary | ICD-10-CM | POA: Diagnosis not present

## 2019-09-19 DIAGNOSIS — Z79899 Other long term (current) drug therapy: Secondary | ICD-10-CM | POA: Diagnosis not present

## 2019-09-19 DIAGNOSIS — F0390 Unspecified dementia without behavioral disturbance: Secondary | ICD-10-CM | POA: Diagnosis present

## 2019-09-19 DIAGNOSIS — K625 Hemorrhage of anus and rectum: Secondary | ICD-10-CM | POA: Diagnosis not present

## 2019-10-01 DIAGNOSIS — K529 Noninfective gastroenteritis and colitis, unspecified: Secondary | ICD-10-CM | POA: Diagnosis not present

## 2019-10-01 DIAGNOSIS — N179 Acute kidney failure, unspecified: Secondary | ICD-10-CM | POA: Diagnosis not present

## 2019-10-01 DIAGNOSIS — Z79899 Other long term (current) drug therapy: Secondary | ICD-10-CM | POA: Diagnosis not present

## 2019-10-01 DIAGNOSIS — Z6825 Body mass index (BMI) 25.0-25.9, adult: Secondary | ICD-10-CM | POA: Diagnosis not present

## 2019-10-01 DIAGNOSIS — I10 Essential (primary) hypertension: Secondary | ICD-10-CM | POA: Diagnosis not present

## 2019-10-01 DIAGNOSIS — I25119 Atherosclerotic heart disease of native coronary artery with unspecified angina pectoris: Secondary | ICD-10-CM | POA: Diagnosis not present

## 2019-10-28 DIAGNOSIS — M1990 Unspecified osteoarthritis, unspecified site: Secondary | ICD-10-CM | POA: Diagnosis not present

## 2019-10-28 DIAGNOSIS — N201 Calculus of ureter: Secondary | ICD-10-CM | POA: Diagnosis not present

## 2019-10-28 DIAGNOSIS — R1031 Right lower quadrant pain: Secondary | ICD-10-CM | POA: Diagnosis not present

## 2019-10-28 DIAGNOSIS — E785 Hyperlipidemia, unspecified: Secondary | ICD-10-CM | POA: Diagnosis not present

## 2019-10-28 DIAGNOSIS — N200 Calculus of kidney: Secondary | ICD-10-CM | POA: Diagnosis not present

## 2019-10-28 DIAGNOSIS — I129 Hypertensive chronic kidney disease with stage 1 through stage 4 chronic kidney disease, or unspecified chronic kidney disease: Secondary | ICD-10-CM | POA: Diagnosis not present

## 2019-10-28 DIAGNOSIS — I2510 Atherosclerotic heart disease of native coronary artery without angina pectoris: Secondary | ICD-10-CM | POA: Diagnosis not present

## 2019-10-28 DIAGNOSIS — Z7982 Long term (current) use of aspirin: Secondary | ICD-10-CM | POA: Diagnosis not present

## 2019-10-28 DIAGNOSIS — R079 Chest pain, unspecified: Secondary | ICD-10-CM | POA: Diagnosis not present

## 2019-10-28 DIAGNOSIS — Z955 Presence of coronary angioplasty implant and graft: Secondary | ICD-10-CM | POA: Diagnosis not present

## 2019-10-28 DIAGNOSIS — Z79899 Other long term (current) drug therapy: Secondary | ICD-10-CM | POA: Diagnosis not present

## 2019-10-28 DIAGNOSIS — N1832 Chronic kidney disease, stage 3b: Secondary | ICD-10-CM | POA: Diagnosis not present

## 2019-10-28 DIAGNOSIS — R109 Unspecified abdominal pain: Secondary | ICD-10-CM | POA: Diagnosis not present

## 2019-10-28 DIAGNOSIS — I252 Old myocardial infarction: Secondary | ICD-10-CM | POA: Diagnosis not present

## 2019-10-29 DIAGNOSIS — R079 Chest pain, unspecified: Secondary | ICD-10-CM | POA: Diagnosis not present

## 2019-10-29 DIAGNOSIS — I2510 Atherosclerotic heart disease of native coronary artery without angina pectoris: Secondary | ICD-10-CM | POA: Diagnosis not present

## 2019-10-29 DIAGNOSIS — N200 Calculus of kidney: Secondary | ICD-10-CM | POA: Diagnosis not present

## 2019-10-29 DIAGNOSIS — Z955 Presence of coronary angioplasty implant and graft: Secondary | ICD-10-CM | POA: Diagnosis not present

## 2019-11-05 ENCOUNTER — Other Ambulatory Visit: Payer: Self-pay | Admitting: Cardiology

## 2019-11-05 NOTE — Telephone Encounter (Signed)
Rx request sent to pharmacy.  

## 2019-11-11 ENCOUNTER — Ambulatory Visit: Payer: Medicare Other | Admitting: Cardiology

## 2019-11-24 NOTE — Progress Notes (Signed)
HPI: FU CAD and chronic CP. S/P CABG 2001; multiple PCI since. Abd ultrasound 8/18 showed no aneurysm. Last cath 8/19 60 RCA, 100 LAD, 95 LM, 95 Lcx; patent radial graft to ramus, patent LIMA to LAD; had PCI of LM and Lcx. Last echo 8/19 showed normal LV function, grade 2 DD, mild LAE, trace AI and MR. ABIs 11/19 normal. Carotid dopplers 12/20 showed 40-59 right and 1-39 left. Vascular disease followed by vascular surgery. Since last seen,  he denies dyspnea on exertion, orthopnea, PND, pedal edema or syncope.  He occasionally has chest discomfort after eating but has no exertional chest pain.  Current Outpatient Medications  Medication Sig Dispense Refill  . amLODipine (NORVASC) 10 MG tablet Take 10 mg by mouth daily.    Marland Kitchen aspirin EC 81 MG tablet Take 81 mg by mouth at bedtime.     . clopidogrel (PLAVIX) 75 MG tablet TAKE 1 TABLET BY MOUTH DAILY. PLEASE SCHEDULE ANNUAL APPT WITH DR. Bobby Barton FOR REFILLS. [phone]. 30 tablet 3  . cyanocobalamin 1000 MCG tablet Take 1,000 mcg by mouth at bedtime.     . isosorbide mononitrate (IMDUR) 30 MG 24 hr tablet TAKE 1 TABLET (30 MG TOTAL) BY MOUTH DAILY. ** DO NOT CRUSH ** 90 tablet 2  . LORazepam (ATIVAN) 0.5 MG tablet Take 0.5 mg by mouth daily as needed for anxiety.  2  . metoCLOPramide (REGLAN) 10 MG tablet Take 10 mg by mouth daily as needed for nausea.    . metoprolol tartrate (LOPRESSOR) 50 MG tablet Take 50 mg by mouth 2 (two) times daily.    . nitroGLYCERIN (NITROSTAT) 0.4 MG SL tablet Place 1 tablet (0.4 mg total) under the tongue every 5 (five) minutes as needed. For chest pain 25 tablet 11  . pantoprazole (PROTONIX) 40 MG tablet Take 1 tablet (40 mg total) by mouth daily. 30 tablet 1  . PARoxetine (PAXIL) 20 MG tablet Take 20 mg by mouth at bedtime.   5   No current facility-administered medications for this visit.     Past Medical History:  Diagnosis Date  . Anemia 10/06/2011  . Anginal pain ([place])   . Arthritis    "joints  hurt; shoulders, arms, back" (08/13/2013)  . Atrial fibrillation ([place])   . B12 deficiency   . Carotid stenosis 04/10/11   a. s/p left carotid endarterectomy 04/14/2011.;  b.  Carotid US (11/13):  L CEA ok; RICA 1-39%  . Chronic bronchitis ([place])    "get it q yr"  . Chronic chest pain   . Chronic lower back pain   . Colon polyp    adenomatous  . Coronary artery disease    a. S/p CABG in 2001. b. S/p DES to protected LM and BMS to RCA 2006. c. 12/2009: s/p DES to Cx;  d. 09/2011 Cath: patent stents, patent grafts -->Med Rx.;  e. CP with abnl Nuc => LHC 10/04/12: oLM 30%, dLM stent into the CFX patent, LAD occluded, LIMA-LAD patent, RI 30%, mid AVCFX 30%, oOM1 50%, oRCA stent patent, mRCA 50-60%, Radial graft-Dx patent; EF 55%.=> Med Rx.  f. stent to LM & Circ,  . Daily headache    "for the last couple weeks" (08/13/2013)  . Diverticulosis   . Esophageal stricture   . GERD (gastroesophageal reflux disease)   . Hemorrhoids   . Hiatal hernia   . Hyperlipidemia   . Hypertension   . Left bundle branch block   . Pancreatitis Dec 2011  ERCP ok.  . Prostate cancer [facility])    s/p cryoablation  . Renal cyst    Seen on CT 08/2011 also with circumferential bladder wall thickening  . Skin cancer    "cut it off my right ear"  . Statin intolerance     Past Surgical History:  Procedure Laterality Date  . CARDIAC CATHETERIZATION    . CHOLECYSTECTOMY    . COLONOSCOPY    . CORONARY ANGIOPLASTY WITH STENT PLACEMENT     "1 + 1"  . CORONARY ARTERY BYPASS GRAFT  2001  . CORONARY STENT INTERVENTION N/A 09/11/2017   Procedure: CORONARY STENT INTERVENTION;  Surgeon: Lorretta Harp, MD;  Location: [place] [facility];  Service: Cardiovascular;  Laterality: N/A;  . CORONARY STENT PLACEMENT  09/11/2017   Ost Cx to Prox Cx lesion is 95% stenosed.  Marland Kitchen ENDARTERECTOMY  04/14/2011   Procedure: ENDARTERECTOMY CAROTID;  Surgeon: Mal Misty, MD;  Location: [place];  Service: Vascular;  Laterality: Left;  Would like  to perform procedure first, at 0730  . EYE SURGERY     cataract  . INGUINAL HERNIA REPAIR Bilateral   . LEFT HEART CATH AND CORS/GRAFTS ANGIOGRAPHY N/A 09/11/2017   Procedure: LEFT HEART CATH AND CORS/GRAFTS ANGIOGRAPHY;  Surgeon: Lorretta Harp, MD;  Location: [place] [facility];  Service: Cardiovascular;  Laterality: N/A;  . LEFT HEART CATHETERIZATION WITH CORONARY/GRAFT ANGIOGRAM N/A 10/05/2011   Procedure: LEFT HEART CATHETERIZATION WITH Beatrix Fetters;  Surgeon: Hillary Bow, MD;  [facility] C[facility];  Service: Cardiovascular;  Laterality: N/A;  . LEFT HEART CATHETERIZATION WITH CORONARY/GRAFT ANGIOGRAM N/A 10/04/2012   Procedure: LEFT HEART CATHETERIZATION WITH Beatrix Fetters;  Surgeon: Jolaine Artist, MD;  Location: [facility] CATH LAB;  Service: Cardiovascular;  Laterality: N/A;  . POLYPECTOMY    . PR VEIN BYPASS GRAFT,AORTO-FEM-POP    . PROSTATE CRYOABLATION      Social History   Socioeconomic History  . Marital status: Widowed    Spouse name: Not on file  . Number of children: 2  . Years of education: Not on file  . Highest education level: Not on file  Occupational History  . Occupation: Retired    Comment: raised Sales promotion account executive  Tobacco Use  . Smoking status: Former Smoker    Years: 0.10    Types: Cigarettes  . Smokeless tobacco: Current User    Types: Chew  . Tobacco comment: "smoked a few cigarettes; no more than 1 month"  Vaping Use  . Vaping Use: Never used  Substance and Sexual Activity  . Alcohol use: No    Alcohol/week: 0.0 standard drinks    Comment: "no alcohol since I was a teenager"  . Drug use: No  . Sexual activity: Not Currently  Other Topics Concern  . Not on file  Social History Narrative   Patient is illiterate. He cannot read or write. He left school at about the seventh grade.   As of 10/2015 he reports that his wife is chronically ill at home with heart disease and COPD and is under hospice care. The bulk of the care is  given by the patient and daughter.   Social Determinants of Health   Financial Resource Strain:   . Difficulty of Paying Living Expenses: Not on file  Food Insecurity:   . Worried About Charity fundraiser in the Last Year: Not on file  . Ran Out of Food in the Last Year: Not on file  Transportation Needs:   .  Lack of Transportation (Medical): Not on file  . Lack of Transportation (Non-Medical): Not on file  Physical Activity:   . Days of Exercise per Week: Not on file  . Minutes of Exercise per Session: Not on file  Stress:   . Feeling of Stress : Not on file  Social Connections:   . Frequency of Communication with Friends and Family: Not on file  . Frequency of Social Gatherings with Friends and Family: Not on file  . Attends Religious Services: Not on file  . Active Member of Clubs or Organizations: Not on file  . Attends Archivist Meetings: Not on file  . Marital Status: Not on file  Intimate Partner Violence:   . Fear of Current or Ex-Partner: Not on file  . Emotionally Abused: Not on file  . Physically Abused: Not on file  . Sexually Abused: Not on file    Family History  Problem Relation Age of Onset  . Heart disease Mother        Heart Disease before age 68  . Hypertension Mother   . Heart attack Mother   . Lung cancer Father   . Hypertension Sister   . Heart disease Sister        Heart Disease before age 62  . Cancer Sister   . Heart attack Sister   . Hypertension Brother   . Heart disease Brother        Heart Disease before age 83  . Heart attack Brother   . Colon cancer Neg Hx   . Esophageal cancer Neg Hx   . Pancreatic cancer Neg Hx   . Stomach cancer Neg Hx     ROS: no fevers or chills, productive cough, hemoptysis, dysphasia, odynophagia, melena, hematochezia, dysuria, hematuria, rash, seizure activity, orthopnea, PND, pedal edema, claudication. Remaining systems are negative.  Physical Exam: Well-developed well-nourished in no acute  distress.  Skin is warm and dry.  HEENT is normal.  Neck is supple.  Chest is clear to auscultation with normal expansion.  Cardiovascular exam is regular rate and rhythm.  Abdominal exam nontender or distended. No masses palpated. Extremities show no edema. neuro grossly intact  ECG-sinus rhythm at a rate of 60, left bundle branch block.  Personally reviewed  A/P  1 coronary artery disease-plan to continue medical therapy including aspirin; intolerant to statins. Discontinue Plavix.  2 chest pain-he has had difficulties with recurrent chest pain in the past.  He presently has occasional pain after eating but no exertional chest pain.  No plans for further ischemia evaluation at this point.  3 hypertension-blood pressure elevated; add losartan 50 mg daily.  Check potassium and renal function in 1 week.  Follow blood pressure and adjust regimen as needed.  4 hyperlipidemia-intolerant to statins.  Continue Repatha.  5 carotid artery disease-he will need follow-up carotid Dopplers December 2021. This is followed by vascular surgery.  Kirk Ruths, MD

## 2019-12-01 ENCOUNTER — Ambulatory Visit (INDEPENDENT_AMBULATORY_CARE_PROVIDER_SITE_OTHER): Payer: Medicare Other | Admitting: Cardiology

## 2019-12-01 ENCOUNTER — Other Ambulatory Visit: Payer: Self-pay

## 2019-12-01 ENCOUNTER — Encounter: Payer: Self-pay | Admitting: Cardiology

## 2019-12-01 VITALS — BP 150/73 | HR 60 | Temp 97.5°F | Ht 60.0 in | Wt 138.0 lb

## 2019-12-01 DIAGNOSIS — I10 Essential (primary) hypertension: Secondary | ICD-10-CM

## 2019-12-01 DIAGNOSIS — R072 Precordial pain: Secondary | ICD-10-CM

## 2019-12-01 DIAGNOSIS — I2510 Atherosclerotic heart disease of native coronary artery without angina pectoris: Secondary | ICD-10-CM | POA: Diagnosis not present

## 2019-12-01 DIAGNOSIS — E785 Hyperlipidemia, unspecified: Secondary | ICD-10-CM

## 2019-12-01 MED ORDER — LOSARTAN POTASSIUM 50 MG PO TABS: 50.0000 mg | ORAL_TABLET | Freq: Every day | ORAL | 3 refills | Status: DC

## 2019-12-01 NOTE — Patient Instructions (Signed)
Medication Instructions:   STOP CLOPIDOGREL OR PLAVIX  START LOSARTAN 50 MG ONCE DAILY   *If you need a refill on your cardiac medications before your next appointment, please call your pharmacy*   Lab Work:  Your physician recommends that you return for lab work in:ONE WEEK  If you have labs (blood work) drawn today and your tests are completely normal, you will receive your results only by: Marland Kitchen MyChart Message (if you have MyChart) OR . A paper copy in the mail If you have any lab test that is abnormal or we need to change your treatment, we will call you to review the results.  Follow-Up: At [facility], you and your health needs are our priority.  As part of our continuing mission to provide you with exceptional heart care, we have created designated Provider Care Teams.  These Care Teams include your primary Cardiologist (physician) and Advanced Practice Providers (APPs -  Physician Assistants and Nurse Practitioners) who all work together to provide you with the care you need, when you need it.  We recommend signing up for the patient portal called "MyChart".  Sign up information is provided on this After Visit Summary.  MyChart is used to connect with patients for Virtual Visits (Telemedicine).  Patients are able to view lab/test results, encounter notes, upcoming appointments, etc.  Non-urgent messages can be sent to your provider as well.   To learn more about what you can do with MyChart, go to [email or web address].    Your next appointment:   12 month(s)  The format for your next appointment:   In Person  Provider:   Kirk Ruths, MD

## 2019-12-16 DIAGNOSIS — K5730 Diverticulosis of large intestine without perforation or abscess without bleeding: Secondary | ICD-10-CM | POA: Diagnosis not present

## 2019-12-16 DIAGNOSIS — I771 Stricture of artery: Secondary | ICD-10-CM | POA: Diagnosis not present

## 2019-12-16 DIAGNOSIS — I708 Atherosclerosis of other arteries: Secondary | ICD-10-CM | POA: Diagnosis not present

## 2019-12-16 DIAGNOSIS — M25512 Pain in left shoulder: Secondary | ICD-10-CM | POA: Diagnosis not present

## 2019-12-16 DIAGNOSIS — N281 Cyst of kidney, acquired: Secondary | ICD-10-CM | POA: Diagnosis not present

## 2019-12-16 DIAGNOSIS — R079 Chest pain, unspecified: Secondary | ICD-10-CM | POA: Diagnosis not present

## 2019-12-16 DIAGNOSIS — R1012 Left upper quadrant pain: Secondary | ICD-10-CM | POA: Diagnosis not present

## 2019-12-16 DIAGNOSIS — R1084 Generalized abdominal pain: Secondary | ICD-10-CM | POA: Diagnosis not present

## 2019-12-16 DIAGNOSIS — R1031 Right lower quadrant pain: Secondary | ICD-10-CM | POA: Diagnosis not present

## 2019-12-16 DIAGNOSIS — R0602 Shortness of breath: Secondary | ICD-10-CM | POA: Diagnosis not present

## 2019-12-19 DIAGNOSIS — Z23 Encounter for immunization: Secondary | ICD-10-CM | POA: Diagnosis not present

## 2020-01-11 ENCOUNTER — Other Ambulatory Visit: Payer: Self-pay | Admitting: Cardiology

## 2020-01-15 DIAGNOSIS — Z20828 Contact with and (suspected) exposure to other viral communicable diseases: Secondary | ICD-10-CM | POA: Diagnosis not present

## 2020-01-15 DIAGNOSIS — R051 Acute cough: Secondary | ICD-10-CM | POA: Diagnosis not present

## 2020-01-21 ENCOUNTER — Telehealth: Payer: Self-pay

## 2020-01-21 DIAGNOSIS — E785 Hyperlipidemia, unspecified: Secondary | ICD-10-CM

## 2020-01-21 DIAGNOSIS — I6523 Occlusion and stenosis of bilateral carotid arteries: Secondary | ICD-10-CM

## 2020-01-21 MED ORDER — REPATHA SURECLICK 140 MG/ML ~~LOC~~ SOAJ
140.0000 mg | SUBCUTANEOUS | 11 refills | Status: DC
Start: 2020-01-21 — End: 2021-03-31

## 2020-01-21 NOTE — Telephone Encounter (Signed)
Called and lmomed pt, rx sent, pt instructed to call back if costly, instructed to complete fasting labs after 4th shot and fasting (ordered).

## 2020-01-31 ENCOUNTER — Other Ambulatory Visit: Payer: Self-pay | Admitting: Cardiology

## 2020-02-20 DIAGNOSIS — Z23 Encounter for immunization: Secondary | ICD-10-CM | POA: Diagnosis not present

## 2020-04-23 DIAGNOSIS — E7800 Pure hypercholesterolemia, unspecified: Secondary | ICD-10-CM | POA: Diagnosis not present

## 2020-04-23 DIAGNOSIS — I739 Peripheral vascular disease, unspecified: Secondary | ICD-10-CM | POA: Diagnosis not present

## 2020-04-23 DIAGNOSIS — Z139 Encounter for screening, unspecified: Secondary | ICD-10-CM | POA: Diagnosis not present

## 2020-04-23 DIAGNOSIS — G4762 Sleep related leg cramps: Secondary | ICD-10-CM | POA: Diagnosis not present

## 2020-04-23 DIAGNOSIS — I679 Cerebrovascular disease, unspecified: Secondary | ICD-10-CM | POA: Diagnosis not present

## 2020-04-23 DIAGNOSIS — I10 Essential (primary) hypertension: Secondary | ICD-10-CM | POA: Diagnosis not present

## 2020-04-23 DIAGNOSIS — K219 Gastro-esophageal reflux disease without esophagitis: Secondary | ICD-10-CM | POA: Diagnosis not present

## 2020-04-23 DIAGNOSIS — G8929 Other chronic pain: Secondary | ICD-10-CM | POA: Diagnosis not present

## 2020-04-23 DIAGNOSIS — I25119 Atherosclerotic heart disease of native coronary artery with unspecified angina pectoris: Secondary | ICD-10-CM | POA: Diagnosis not present

## 2020-04-23 DIAGNOSIS — Z125 Encounter for screening for malignant neoplasm of prostate: Secondary | ICD-10-CM | POA: Diagnosis not present

## 2020-04-23 DIAGNOSIS — R109 Unspecified abdominal pain: Secondary | ICD-10-CM | POA: Diagnosis not present

## 2020-04-23 DIAGNOSIS — F419 Anxiety disorder, unspecified: Secondary | ICD-10-CM | POA: Diagnosis not present

## 2020-05-17 DIAGNOSIS — Z9842 Cataract extraction status, left eye: Secondary | ICD-10-CM | POA: Diagnosis not present

## 2020-05-17 DIAGNOSIS — H40023 Open angle with borderline findings, high risk, bilateral: Secondary | ICD-10-CM | POA: Diagnosis not present

## 2020-05-17 DIAGNOSIS — H52223 Regular astigmatism, bilateral: Secondary | ICD-10-CM | POA: Diagnosis not present

## 2020-05-17 DIAGNOSIS — Z9841 Cataract extraction status, right eye: Secondary | ICD-10-CM | POA: Diagnosis not present

## 2020-08-05 DIAGNOSIS — D239 Other benign neoplasm of skin, unspecified: Secondary | ICD-10-CM | POA: Diagnosis not present

## 2020-08-05 DIAGNOSIS — Z6823 Body mass index (BMI) 23.0-23.9, adult: Secondary | ICD-10-CM | POA: Diagnosis not present

## 2020-08-25 DIAGNOSIS — R109 Unspecified abdominal pain: Secondary | ICD-10-CM | POA: Diagnosis not present

## 2020-08-25 DIAGNOSIS — R079 Chest pain, unspecified: Secondary | ICD-10-CM | POA: Diagnosis not present

## 2020-08-25 DIAGNOSIS — Z20822 Contact with and (suspected) exposure to covid-19: Secondary | ICD-10-CM | POA: Diagnosis not present

## 2020-08-25 DIAGNOSIS — R1084 Generalized abdominal pain: Secondary | ICD-10-CM | POA: Diagnosis not present

## 2020-08-26 DIAGNOSIS — R109 Unspecified abdominal pain: Secondary | ICD-10-CM | POA: Diagnosis not present

## 2020-09-01 DIAGNOSIS — N1831 Chronic kidney disease, stage 3a: Secondary | ICD-10-CM | POA: Diagnosis not present

## 2020-09-01 DIAGNOSIS — Z6823 Body mass index (BMI) 23.0-23.9, adult: Secondary | ICD-10-CM | POA: Diagnosis not present

## 2020-09-01 DIAGNOSIS — R0789 Other chest pain: Secondary | ICD-10-CM | POA: Diagnosis not present

## 2020-09-01 DIAGNOSIS — L821 Other seborrheic keratosis: Secondary | ICD-10-CM | POA: Diagnosis not present

## 2020-09-01 DIAGNOSIS — R109 Unspecified abdominal pain: Secondary | ICD-10-CM | POA: Diagnosis not present

## 2020-09-22 DIAGNOSIS — Z20828 Contact with and (suspected) exposure to other viral communicable diseases: Secondary | ICD-10-CM | POA: Diagnosis not present

## 2020-09-22 DIAGNOSIS — I2510 Atherosclerotic heart disease of native coronary artery without angina pectoris: Secondary | ICD-10-CM | POA: Diagnosis not present

## 2020-09-22 DIAGNOSIS — J069 Acute upper respiratory infection, unspecified: Secondary | ICD-10-CM | POA: Diagnosis not present

## 2020-09-22 DIAGNOSIS — J209 Acute bronchitis, unspecified: Secondary | ICD-10-CM | POA: Diagnosis not present

## 2020-10-15 ENCOUNTER — Other Ambulatory Visit: Payer: Self-pay | Admitting: Cardiology

## 2020-10-26 DIAGNOSIS — I679 Cerebrovascular disease, unspecified: Secondary | ICD-10-CM | POA: Diagnosis not present

## 2020-10-26 DIAGNOSIS — K219 Gastro-esophageal reflux disease without esophagitis: Secondary | ICD-10-CM | POA: Diagnosis not present

## 2020-10-26 DIAGNOSIS — R109 Unspecified abdominal pain: Secondary | ICD-10-CM | POA: Diagnosis not present

## 2020-10-26 DIAGNOSIS — G4762 Sleep related leg cramps: Secondary | ICD-10-CM | POA: Diagnosis not present

## 2020-10-26 DIAGNOSIS — I10 Essential (primary) hypertension: Secondary | ICD-10-CM | POA: Diagnosis not present

## 2020-10-26 DIAGNOSIS — Z23 Encounter for immunization: Secondary | ICD-10-CM | POA: Diagnosis not present

## 2020-10-26 DIAGNOSIS — I25119 Atherosclerotic heart disease of native coronary artery with unspecified angina pectoris: Secondary | ICD-10-CM | POA: Diagnosis not present

## 2020-10-26 DIAGNOSIS — G8929 Other chronic pain: Secondary | ICD-10-CM | POA: Diagnosis not present

## 2020-10-26 DIAGNOSIS — R1312 Dysphagia, oropharyngeal phase: Secondary | ICD-10-CM | POA: Diagnosis not present

## 2020-10-26 DIAGNOSIS — F419 Anxiety disorder, unspecified: Secondary | ICD-10-CM | POA: Diagnosis not present

## 2020-10-26 DIAGNOSIS — E7800 Pure hypercholesterolemia, unspecified: Secondary | ICD-10-CM | POA: Diagnosis not present

## 2020-10-26 DIAGNOSIS — I739 Peripheral vascular disease, unspecified: Secondary | ICD-10-CM | POA: Diagnosis not present

## 2020-11-15 DIAGNOSIS — R1312 Dysphagia, oropharyngeal phase: Secondary | ICD-10-CM | POA: Diagnosis not present

## 2020-12-18 DIAGNOSIS — R140 Abdominal distension (gaseous): Secondary | ICD-10-CM | POA: Diagnosis not present

## 2020-12-18 DIAGNOSIS — I2510 Atherosclerotic heart disease of native coronary artery without angina pectoris: Secondary | ICD-10-CM | POA: Diagnosis not present

## 2020-12-18 DIAGNOSIS — R1031 Right lower quadrant pain: Secondary | ICD-10-CM | POA: Diagnosis not present

## 2020-12-18 DIAGNOSIS — I252 Old myocardial infarction: Secondary | ICD-10-CM | POA: Diagnosis not present

## 2020-12-18 DIAGNOSIS — C61 Malignant neoplasm of prostate: Secondary | ICD-10-CM | POA: Diagnosis not present

## 2020-12-18 DIAGNOSIS — I10 Essential (primary) hypertension: Secondary | ICD-10-CM | POA: Diagnosis not present

## 2020-12-18 DIAGNOSIS — R0789 Other chest pain: Secondary | ICD-10-CM | POA: Diagnosis not present

## 2020-12-18 DIAGNOSIS — R1030 Lower abdominal pain, unspecified: Secondary | ICD-10-CM | POA: Diagnosis not present

## 2021-01-14 ENCOUNTER — Other Ambulatory Visit: Payer: Self-pay | Admitting: Cardiology

## 2021-01-30 DIAGNOSIS — Z951 Presence of aortocoronary bypass graft: Secondary | ICD-10-CM | POA: Diagnosis not present

## 2021-01-30 DIAGNOSIS — I2510 Atherosclerotic heart disease of native coronary artery without angina pectoris: Secondary | ICD-10-CM | POA: Diagnosis not present

## 2021-01-30 DIAGNOSIS — I517 Cardiomegaly: Secondary | ICD-10-CM | POA: Diagnosis not present

## 2021-01-30 DIAGNOSIS — R0789 Other chest pain: Secondary | ICD-10-CM | POA: Diagnosis not present

## 2021-01-30 DIAGNOSIS — R1310 Dysphagia, unspecified: Secondary | ICD-10-CM | POA: Diagnosis not present

## 2021-01-30 DIAGNOSIS — I110 Hypertensive heart disease with heart failure: Secondary | ICD-10-CM | POA: Diagnosis not present

## 2021-01-30 DIAGNOSIS — Z20822 Contact with and (suspected) exposure to covid-19: Secondary | ICD-10-CM | POA: Diagnosis not present

## 2021-01-30 DIAGNOSIS — K224 Dyskinesia of esophagus: Secondary | ICD-10-CM | POA: Diagnosis not present

## 2021-01-30 DIAGNOSIS — R1084 Generalized abdominal pain: Secondary | ICD-10-CM | POA: Diagnosis not present

## 2021-01-30 DIAGNOSIS — R079 Chest pain, unspecified: Secondary | ICD-10-CM | POA: Diagnosis not present

## 2021-02-07 ENCOUNTER — Ambulatory Visit: Payer: Medicare Other | Admitting: Physician Assistant

## 2021-02-08 ENCOUNTER — Ambulatory Visit: Payer: Medicare Other | Admitting: Physician Assistant

## 2021-02-18 ENCOUNTER — Other Ambulatory Visit: Payer: Self-pay | Admitting: Cardiology

## 2021-02-21 ENCOUNTER — Ambulatory Visit: Payer: Medicare Other | Admitting: Nurse Practitioner

## 2021-03-02 ENCOUNTER — Other Ambulatory Visit: Payer: Self-pay

## 2021-03-02 DIAGNOSIS — I6523 Occlusion and stenosis of bilateral carotid arteries: Secondary | ICD-10-CM

## 2021-03-07 ENCOUNTER — Encounter (HOSPITAL_COMMUNITY): Payer: Medicare Other

## 2021-03-07 ENCOUNTER — Ambulatory Visit: Payer: Medicare Other

## 2021-03-07 NOTE — Progress Notes (Signed)
HISTORY AND PHYSICAL     CC:  follow up. Requesting Provider:  Cyndi Bender, PA-C  HPI: This is a 81 y.o. male here for follow up for carotid artery stenosis.  Pt is s/p left CEA for symptomatic carotid artery stenosis in 2013 by Dr. Kellie Simmering.    Pt was last seen in December 2020 and at that time he was doing well with stroke sx.    Pt returns today for follow up.    Pt denies any amaurosis fugax, speech difficulties, weakness, numbness, paralysis or clumsiness or facial droop.   He states that he has occasional floaters.  He denies any claudication or non healing wounds.  He does have some numbness in the left leg.  He states he has had some back issues in the past.  He states that he has been having some stomach issues.  He states he feels like he is "blowing up" after he eats or drinks.  He denies any weight loss.  He is scheduled for testing with Dr. Henrene Pastor next week.   He cannot take statins.  He was taking Repatha and it was $47 per injection and then went to $400 per injection and he is no longer taking this.   He has hx of cardiac stenting in August 2019 as well as CABG in 2001.    The pt is not on a statin for cholesterol management.  (Allergy) The pt is on a daily aspirin.   Other AC:  none The pt is on CCB, ARB, BB for hypertension.   The pt is not diabetic.   Tobacco hx:  former  Pt does not have family hx of AAA.  Past Medical History:  Diagnosis Date   Anemia 10/06/2011   Anginal pain ([place])    Arthritis    "joints hurt; shoulders, arms, back" (08/13/2013)   Atrial fibrillation ([place])    B12 deficiency    Carotid stenosis 04/10/11   a. s/p left carotid endarterectomy 04/14/2011.;  b.  Carotid US (11/13):  L CEA ok; RICA 1-39%   Chronic bronchitis ([place])    "get it q yr"   Chronic chest pain    Chronic lower back pain    Colon polyp    adenomatous   Coronary artery disease    a. S/p CABG in 2001. b. S/p DES to protected LM and BMS to RCA 2006. c. 12/2009: s/p DES to  Cx;  d. 09/2011 Cath: patent stents, patent grafts -->Med Rx.;  e. CP with abnl Nuc => LHC 10/04/12: oLM 30%, dLM stent into the CFX patent, LAD occluded, LIMA-LAD patent, RI 30%, mid AVCFX 30%, oOM1 50%, oRCA stent patent, mRCA 50-60%, Radial graft-Dx patent; EF 55%.=> Med Rx.  f. stent to LM & Circ,   Daily headache    "for the last couple weeks" (08/13/2013)   Diverticulosis    Esophageal stricture    GERD (gastroesophageal reflux disease)    Hemorrhoids    Hiatal hernia    Hyperlipidemia    Hypertension    Left bundle branch block    Pancreatitis Dec 2011   ERCP ok.   Prostate cancer [facility])    s/p cryoablation   Renal cyst    Seen on CT 08/2011 also with circumferential bladder wall thickening   Skin cancer    "cut it off my right ear"   Statin intolerance     Past Surgical History:  Procedure Laterality Date   CARDIAC CATHETERIZATION     CHOLECYSTECTOMY  COLONOSCOPY     CORONARY ANGIOPLASTY WITH STENT PLACEMENT     "1 + 1"   CORONARY ARTERY BYPASS GRAFT  2001   CORONARY STENT INTERVENTION N/A 09/11/2017   Procedure: CORONARY STENT INTERVENTION;  Surgeon: Lorretta Harp, MD;  Location: [place] [facility];  Service: Cardiovascular;  Laterality: N/A;   CORONARY STENT PLACEMENT  09/11/2017   Ost Cx to Prox Cx lesion is 95% stenosed.   ENDARTERECTOMY  04/14/2011   Procedure: ENDARTERECTOMY CAROTID;  Surgeon: Mal Misty, MD;  Location: [place];  Service: Vascular;  Laterality: Left;  Would like to perform procedure first, at [place] Bilateral    LEFT HEART CATH AND CORS/GRAFTS ANGIOGRAPHY N/A 09/11/2017   Procedure: LEFT HEART CATH AND CORS/GRAFTS ANGIOGRAPHY;  Surgeon: Lorretta Harp, MD;  Location: [place] [facility];  Service: Cardiovascular;  Laterality: N/A;   LEFT HEART CATHETERIZATION WITH CORONARY/GRAFT ANGIOGRAM N/A 10/05/2011   Procedure: LEFT HEART CATHETERIZATION WITH Beatrix Fetters;  Surgeon: Hillary Bow, MD;  Location: [facility] CATH LAB;  Service: Cardiovascular;  Laterality: N/A;   LEFT HEART CATHETERIZATION WITH CORONARY/GRAFT ANGIOGRAM N/A 10/04/2012   Procedure: LEFT HEART CATHETERIZATION WITH Beatrix Fetters;  Surgeon: Jolaine Artist, MD;  Location: [facility] C[facility] LAB;  Service: Cardiovascular;  Laterality: N/A;   POLYPECTOMY     PR VEIN BYPASS GRAFT,AORTO-FEM-POP     PROSTATE CRYOABLATION      Allergies  Allergen Reactions   Shellfish Allergy Anaphylaxis    Other reaction(s): Other (See Comments)   Zolpidem Tartrate Other (See Comments)    hallucinations   Zolpidem Tartrate     Other reaction(s): Other (See Comments) hallucinations   Brilinta [Ticagrelor]    Amoxicillin-Pot Clavulanate Other (See Comments)    REACTION: 'Burns' Stomach   Amoxicillin-Pot Clavulanate     Other reaction(s): Other (See Comments) REACTION: 'Burns' Stomach   Atorvastatin    Codeine Nausea And Vomiting   Erythromycin Diarrhea, Nausea And Vomiting and Other (See Comments)    All mycins cause upset stomach Other reaction(s): GI Upset (intolerance), Other (See Comments) All mycins cause upset stomach   Hydrocodone Nausea And Vomiting   Morphine Nausea And Vomiting   Nitrofuran Derivatives Other (See Comments)    Unknown reaction unknown   Oxycodone Hcl Nausea And Vomiting   Tramadol Nausea And Vomiting   Zocor [Simvastatin]     myalgia    Current Outpatient Medications  Medication Sig Dispense Refill   isosorbide mononitrate (IMDUR) 30 MG 24 hr tablet TAKE 1 TABLET (30 MG TOTAL) BY MOUTH DAILY. ** DO NOT CRUSH ** 60 tablet 0   amLODipine (NORVASC) 10 MG tablet Take 10 mg by mouth daily.     aspirin EC 81 MG tablet Take 81 mg by mouth at bedtime.      cyanocobalamin 1000 MCG tablet Take 1,000 mcg by mouth at bedtime.      Evolocumab (REPATHA SURECLICK) 644 MG/ML SOAJ Inject 140 mg into the skin every 14 (fourteen) days. 2 mL 11   LORazepam (ATIVAN) 0.5 MG tablet Take 0.5 mg by mouth  daily as needed for anxiety.  2   losartan (COZAAR) 50 MG tablet Take 1 tablet (50 mg total) by mouth daily. 90 tablet 3   metoCLOPramide (REGLAN) 10 MG tablet Take 10 mg by mouth daily as needed for nausea.     metoprolol tartrate (LOPRESSOR) 50 MG tablet Take 50 mg by mouth  2 (two) times daily.     nitroGLYCERIN (NITROSTAT) 0.4 MG SL tablet Place 1 tablet (0.4 mg total) under the tongue every 5 (five) minutes as needed. For chest pain 25 tablet 11   pantoprazole (PROTONIX) 40 MG tablet Take 1 tablet (40 mg total) by mouth daily. 30 tablet 1   PARoxetine (PAXIL) 20 MG tablet Take 20 mg by mouth at bedtime.   5   No current facility-administered medications for this visit.    Family History  Problem Relation Age of Onset   Heart disease Mother        Heart Disease before age 68   Hypertension Mother    Heart attack Mother    Lung cancer Father    Hypertension Sister    Heart disease Sister        Heart Disease before age 76   Cancer Sister    Heart attack Sister    Hypertension Brother    Heart disease Brother        Heart Disease before age 33   Heart attack Brother    Colon cancer Neg Hx    Esophageal cancer Neg Hx    Pancreatic cancer Neg Hx    Stomach cancer Neg Hx     Social History   Socioeconomic History   Marital status: Widowed    Spouse name: Not on file   Number of children: 2   Years of education: Not on file   Highest education level: Not on file  Occupational History   Occupation: Retired    Comment: raised Sales promotion account executive  Tobacco Use   Smoking status: Former    Years: 0.10    Types: Cigarettes   Smokeless tobacco: Current    Types: Chew   Tobacco comments:    "smoked a few cigarettes; no more than 1 month"  Vaping Use   Vaping Use: Never used  Substance and Sexual Activity   Alcohol use: No    Alcohol/week: 0.0 standard drinks    Comment: "no alcohol since I was a teenager"   Drug use: No   Sexual activity: Not Currently  Other Topics Concern    Not on file  Social History Narrative   Patient is illiterate. He cannot read or write. He left school at about the seventh grade.   As of 10/2015 he reports that his wife is chronically ill at home with heart disease and COPD and is under hospice care. The bulk of the care is given by the patient and daughter.   Social Determinants of Health   Financial Resource Strain: Not on file  Food Insecurity: Not on file  Transportation Needs: Not on file  Physical Activity: Not on file  Stress: Not on file  Social Connections: Not on file  Intimate Partner Violence: Not on file     REVIEW OF SYSTEMS:   [X]  denotes positive finding, [ ]  denotes negative finding Cardiac  Comments:  Chest pain or chest pressure:    Shortness of breath upon exertion:    Short of breath when lying flat:    Irregular heart rhythm:        Vascular    Pain in calf, thigh, or hip brought on by ambulation:    Pain in feet at night that wakes you up from your sleep:     Blood clot in your veins:    Leg swelling:         Pulmonary    Oxygen at home:  Productive cough:     Wheezing:         Neurologic    Sudden weakness in arms or legs:     Sudden numbness in arms or legs:     Sudden onset of difficulty speaking or slurred speech:    Temporary loss of vision in one eye:     Problems with dizziness:         Gastrointestinal    Blood in stool:     Vomited blood:         Genitourinary    Burning when urinating:     Blood in urine:        Psychiatric    Major depression:         Hematologic    Bleeding problems:    Problems with blood clotting too easily:        Skin    Rashes or ulcers:        Constitutional    Fever or chills:      PHYSICAL EXAMINATION:  Today's Vitals   03/09/21 1140 03/09/21 1142  BP: (!) 153/71 (!) 148/72  Pulse: (!) 54   Resp: 16   Temp: 98.1 F (36.7 C)   TempSrc: Temporal   SpO2: 99%   Weight: 143 lb (64.9 kg)   Height: 5\' 4"  (1.626 m)    Body mass  index is 24.55 kg/m.   General:  WDWN in NAD; vital signs documented above Gait: Not observed HENT: WNL, normocephalic Pulmonary: normal non-labored breathing Cardiac: regular HR, without carotid bruits Abdomen: soft, NT; aortic pulse is not palpable Skin: well healed left CEA scar Vascular Exam/Pulses:  Right Left  Radial 2+ (normal) 2+ (normal)  Popliteal Unable to palpate Unable to palpate  AT 2+ (normal) 2+ (normal)   Extremities: without ischemic changes, without Gangrene , without cellulitis; without open wounds Musculoskeletal: no muscle wasting or atrophy  Neurologic: A&O X 3; moving all extremities equally; speech is fluent/normal Psychiatric:  The pt has Normal affect.   Non-Invasive Vascular Imaging:   Carotid Duplex on 03/09/2021: Right:  40-59% ICA stenosis Left:  1-39% ICA stenosis Vertebrals:  Bilateral vertebral arteries demonstrate antegrade flow.  Subclavians: Normal flow hemodynamics were seen in bilateral subclavian  arteries.   Previous Carotid duplex on 12/30/2018: Right: 40-59% ICA stenosis Left:   1-39% ICA stenosis    ASSESSMENT/PLAN:: 81 y.o. male here for follow up carotid artery stenosis and is s/p left CEA for symptomatic carotid artery stenosis in 2013 by Dr. Kellie Simmering.    -duplex today is essentially unchanged with 40-59% right ICA stenosis and 1-39% left ICA stenosis.  He remains asymptomatic.   -discussed s/s of stroke with pt and he understands should he develop any of these sx, he will go to the nearest ER or call 911. -pt will f/u in one year with carotid duplex -pt will call sooner should they have any issues. -continue  asa pt has allergy to statin.    Leontine Locket, [facility] [phone]  Clinic MD:  Donzetta Matters

## 2021-03-09 ENCOUNTER — Other Ambulatory Visit: Payer: Self-pay

## 2021-03-09 ENCOUNTER — Ambulatory Visit (HOSPITAL_COMMUNITY)
Admission: RE | Admit: 2021-03-09 | Discharge: 2021-03-09 | Disposition: A | Discharge status: Home / Self Care | Payer: Medicare Other | Source: Ambulatory Visit | Attending: Vascular Surgery | Admitting: Vascular Surgery

## 2021-03-09 ENCOUNTER — Encounter: Payer: Self-pay | Admitting: Physician Assistant

## 2021-03-09 ENCOUNTER — Ambulatory Visit (INDEPENDENT_AMBULATORY_CARE_PROVIDER_SITE_OTHER): Payer: Medicare Other | Admitting: Physician Assistant

## 2021-03-09 VITALS — BP 148/72 | HR 54 | Temp 98.1°F | Resp 16 | Ht 64.0 in | Wt 143.0 lb

## 2021-03-09 DIAGNOSIS — I6523 Occlusion and stenosis of bilateral carotid arteries: Secondary | ICD-10-CM | POA: Diagnosis not present

## 2021-03-20 NOTE — Progress Notes (Signed)
03/20/2021 [name] [name] [id number] 12/02/1940   Chief Complaint: Abdominal pain  History of Present Illness: [name] [name] is an 81 year old male with a past medical history of hypertension, hyperlipidemia, coronary artery disease s/p CABG 2001, s/p stent placement 2019, atrial fibrillation, carotid artery disease s/p left carotid endarterectomy 2013, prostate cancer s/p cryoablation, GERD, esophageal stricture and colon polyps. He is followed by Dr. Henrene Pastor.  He presents her office today for further evaluation regarding lower abdominal pain which radiates into his upper abdomen with intermittent RUQ pain, mild nausea without vomiting which has been intermittently occurred since 08/2020.  He reports going to [facility] 3 times from 09/11/2020 to 01/11/2021 due to significant abdominal pain.  I am unable to access his [facility] records at this time.  He possibly had one CTAP during his ED evaluations, further details are unclear.  He complains of having central lower abdominal pain which radiates up to the epigastric area which occurs almost every time he eats or drinks water.  He also has abdominal bloating, describes feeling distended like a balloon.  Foods such as grits and ice cream worsen his abdominal pain and distention.  No fever, sweats or chills.  No weight loss.  He has intermittent dysphagia, feels food gets stuck to the throat area then passes down the esophagus after he drinks water which occurs approximately once weekly for the past year or  two.  No heartburn.  He has worse upper abdominal pain when he bends forward.  He is also experiencing dyspnea with exertion and he is scheduled to see his cardiologist Dr. [name] in 2 weeks.  He denies having any chest pain or palpitations.  He is taking pantoprazole 40 mg twice daily for many years.  He has dizziness and he was previously diagnosed with vertigo, ever, he saw an ENT and TEE at some point he did not think he had  vertigo.  He was out last night poking his TV antenna with a pole when he looked up then became dizzy and briefly blacked out and fell.  He did not lose consciousness and he did not hit his head.  He landed on his right side.  His right eye is somewhat bloodshot, he denies any trauma or eye pain.  No other complaints at this time.  He underwent an EGD and colonoscopy by Dr.  02/25/2014, see there result below:  EGD 02/25/2014: EXAM:The esophagus revealed a large caliber distal esophageal ring but was otherwise normal. The stomach was normal. The duodenum was normal. Retroflexed views revealed a hiatal hernia. The scope was then withdrawn from the patient and the procedure completed. THERAPY: 54 French Maloney dilator was passed into the esophagus without resistance or heme. ENDOSCOPIC IMPRESSION: 1. GERD 2. Distal esophageal stricture status post dilation  Colonoscopy 02/25/2014: 1. Two polyps measuring 3 mm in size were found in the ascending colon; polypectomy was performed with a cold snare 2. Moderate diverticulosis was noted in the sigmoid colon 3. The examination was otherwise normal - TUBULAR ADENOMA (TWO FRAGMENTS). NO HIGH GRADE DYSPLASIA OR MALIGNANCY  He was last seen in office by Dr. Henrene Pastor 05/22/2019, at that time, a colon polyp surveillance colonoscopy was recommended.  However, the patient canceled this procedure.   CBC Latest Ref Rng & Units 09/28/2017 09/27/2017 09/12/2017  WBC 4.0 - 10.5 K/uL 5.7 6.9 15.4(H)  Hemoglobin 13.0 - 17.0 g/dL 15.0 14.7 12.5(L)  Hematocrit 39.0 - 52.0 % 44.8 43.3 36.7(L)  Platelets  150 - 400 K/uL 224 227 187    CMP Latest Ref Rng & Units 01/31/2018 09/28/2017 09/27/2017  Glucose 70 - 99 mg/dL - 105(H) 145(H)  BUN 8 - 23 mg/dL - 18 20  Creatinine 0.61 - 1.24 mg/dL - 1.36(H) 1.44(H)  Sodium 135 - 145 mmol/L - 143 140  Potassium 3.5 - 5.1 mmol/L - 4.3 3.6  Chloride 98 - 111 mmol/L - 108 109  CO2 22 - 32 mmol/L - 26 19(L)  Calcium 8.9 - 10.3 mg/dL - 9.3 9.2   Total Protein 6.0 - 8.5 g/dL 6.6 6.7 -  Total Bilirubin 0.0 - 1.2 mg/dL 1.2 2.3(H) -  Alkaline Phos 39 - 117 IU/L 74 59 -  AST 0 - 40 IU/L 14 18 -  ALT 0 - 44 IU/L 15 19 -    Current Outpatient Medications on File Prior to Visit  Medication Sig Dispense Refill   amLODipine (NORVASC) 10 MG tablet Take 10 mg by mouth daily.     aspirin EC 81 MG tablet Take 81 mg by mouth at bedtime.      isosorbide mononitrate (IMDUR) 30 MG 24 hr tablet TAKE 1 TABLET (30 MG TOTAL) BY MOUTH DAILY. ** DO NOT CRUSH ** 60 tablet 0   LORazepam (ATIVAN) 0.5 MG tablet Take 0.5 mg by mouth daily as needed for anxiety.  2   losartan (COZAAR) 50 MG tablet Take 1 tablet (50 mg total) by mouth daily. (Patient taking differently: Take 50 mg by mouth as needed.) 90 tablet 3   metoCLOPramide (REGLAN) 10 MG tablet Take 10 mg by mouth daily as needed for nausea.     metoprolol tartrate (LOPRESSOR) 50 MG tablet Take 50 mg by mouth 2 (two) times daily.     pantoprazole (PROTONIX) 40 MG tablet Take 1 tablet (40 mg total) by mouth daily. 30 tablet 1   PARoxetine (PAXIL) 20 MG tablet Take 20 mg by mouth at bedtime.   5   Evolocumab (REPATHA SURECLICK) 397 MG/ML SOAJ Inject 140 mg into the skin every 14 (fourteen) days. (Patient not taking: Reported on 03/21/2021) 2 mL 11   nitroGLYCERIN (NITROSTAT) 0.4 MG SL tablet Place 1 tablet (0.4 mg total) under the tongue every 5 (five) minutes as needed. For chest pain (Patient not taking: Reported on 03/21/2021) 25 tablet 11   No current facility-administered medications on file prior to visit.   Allergies  Allergen Reactions   Shellfish Allergy Anaphylaxis    Other reaction(s): Other (See Comments)   Zolpidem Tartrate Other (See Comments)    hallucinations   Zolpidem Tartrate     Other reaction(s): Other (See Comments) hallucinations   Brilinta [Ticagrelor]    Amoxicillin-Pot Clavulanate Other (See Comments)    REACTION: 'Burns' Stomach   Amoxicillin-Pot Clavulanate     Other  reaction(s): Other (See Comments) REACTION: 'Burns' Stomach   Atorvastatin    Codeine Nausea And Vomiting   Erythromycin Diarrhea, Nausea And Vomiting and Other (See Comments)    All mycins cause upset stomach Other reaction(s): GI Upset (intolerance), Other (See Comments) All mycins cause upset stomach   Hydrocodone Nausea And Vomiting   Morphine Nausea And Vomiting   Nitrofuran Derivatives Other (See Comments)    Unknown reaction unknown   Oxycodone Hcl Nausea And Vomiting   Tramadol Nausea And Vomiting   Zocor [Simvastatin]     myalgia   Current Medications, Allergies, Past Medical History, Past Surgical History, Family History and Social History were reviewed in [employer]  medical record.  Review of Systems:   Constitutional: Negative for fever, sweats, chills or weight loss.  Respiratory: Negative for shortness of breath.   Cardiovascular: Negative for chest pain, palpitations and leg swelling.  Gastrointestinal: See HPI.  Musculoskeletal: Negative for back pain or muscle aches.  Neurological: Negative for dizziness, headaches or paresthesias.   Physical Exam: BP 130/64 (BP Location: Left Arm, Patient Position: Sitting, Cuff Size: Normal)    Pulse 60    Ht 5' 2.5" (1.588 m) Comment: height measured without shoes   Wt 141 lb 2 oz (64 kg)    BMI 25.40 kg/m   General: 81 year old male in no acute distress. Head: Normocephalic and atraumatic. Eyes: No scleral icterus. Right eye subconjunctiva  hemorrhage.  Ears: Normal auditory acuity. Mouth: Absent dentition. No ulcers or lesions.  Lungs: Clear throughout to auscultation. Chest: Mediastinal scar intact. Heart: Regular rate and rhythm, no murmur. Abdomen: Soft, nontender and nondistended. No masses or hepatomegaly. Normal bowel sounds x 4 quadrants.  Rectal: Deferred.  Musculoskeletal: Symmetrical with no gross deformities. Extremities: No edema. Neurological: Alert oriented x 4. No focal deficits.   Psychological: Alert and cooperative. Normal mood and affect  Assessment and Recommendations:  24) 81 year old male with central lower abdominal pain which radiates up to the epigastric area which occurs most days since 08/2020.  Normal bowel movements. -Request labs and any abdominal imaging from [facility] ED -CBC, CMP -CTAP with oral and IV contrast, BUN/creatinine level to be reviewed prior to the patient receiving IV contrast -Patient to present to the ED if he develops severe abdominal pain  2) Nausea -Plan #1 -Patient denies need for antiemetic at this time  3) GERD, esophageal stricture with recurrent dysphagia -Continue Pantoprazole 40 mg p.o. twice daily -Start Famotidine 20 mg at Dinnertime  4) History of 2 tubular adenomatous polyps removed from the ascending colon per colonoscopy 02/2014.  Patient was scheduled for a colonoscopy in 2021 but he canceled this procedure.  5) CAD s/p CABG s/p stent on ASA (no longer on Plavix)  6) DOE -Follow-up with cardiologist Dr. [name] in 2 weeks as scheduled  7) Carotid artery disease s/p left CEA 2013 on ASA  Await the above lab and CTAP results.  Patient is at high risk for any invasive evaluation secondary to age and multiple comorbidities.  He will need cardiac clearance prior to pursuing any endoscopic evaluation.

## 2021-03-21 ENCOUNTER — Encounter: Payer: Self-pay | Admitting: Nurse Practitioner

## 2021-03-21 ENCOUNTER — Ambulatory Visit (INDEPENDENT_AMBULATORY_CARE_PROVIDER_SITE_OTHER): Payer: Medicare Other | Admitting: Nurse Practitioner

## 2021-03-21 ENCOUNTER — Other Ambulatory Visit (INDEPENDENT_AMBULATORY_CARE_PROVIDER_SITE_OTHER): Payer: Medicare Other

## 2021-03-21 VITALS — BP 130/64 | HR 60 | Ht 62.5 in | Wt 141.1 lb

## 2021-03-21 DIAGNOSIS — R1030 Lower abdominal pain, unspecified: Secondary | ICD-10-CM | POA: Diagnosis not present

## 2021-03-21 DIAGNOSIS — R1010 Upper abdominal pain, unspecified: Secondary | ICD-10-CM | POA: Diagnosis not present

## 2021-03-21 LAB — CBC WITH DIFFERENTIAL/PLATELET
Basophils Absolute: 0.1 10*3/uL (ref 0.0–0.1)
Basophils Relative: 1.1 % (ref 0.0–3.0)
Eosinophils Absolute: 0.2 10*3/uL (ref 0.0–0.7)
Eosinophils Relative: 3.5 % (ref 0.0–5.0)
HCT: 38.7 % — ABNORMAL LOW (ref 39.0–52.0)
Hemoglobin: 12.5 g/dL — ABNORMAL LOW (ref 13.0–17.0)
Lymphocytes Relative: 30 % (ref 12.0–46.0)
Lymphs Abs: 1.8 10*3/uL (ref 0.7–4.0)
MCHC: 32.4 g/dL (ref 30.0–36.0)
MCV: 86.8 fl (ref 78.0–100.0)
Monocytes Absolute: 0.5 10*3/uL (ref 0.1–1.0)
Monocytes Relative: 8.5 % (ref 3.0–12.0)
Neutro Abs: 3.4 10*3/uL (ref 1.4–7.7)
Neutrophils Relative %: 56.9 % (ref 43.0–77.0)
Platelets: 183 10*3/uL (ref 150.0–400.0)
RBC: 4.47 Mil/uL (ref 4.22–5.81)
RDW: 14.5 % (ref 11.5–15.5)
WBC: 6 10*3/uL (ref 4.0–10.5)

## 2021-03-21 LAB — COMPREHENSIVE METABOLIC PANEL
ALT: 9 U/L (ref 0–53)
AST: 14 U/L (ref 0–37)
Albumin: 4.4 g/dL (ref 3.5–5.2)
Alkaline Phosphatase: 52 U/L (ref 39–117)
BUN: 23 mg/dL (ref 6–23)
CO2: 28 mEq/L (ref 19–32)
Calcium: 9.6 mg/dL (ref 8.4–10.5)
Chloride: 105 mEq/L (ref 96–112)
Creatinine, Ser: 1.44 mg/dL (ref 0.40–1.50)
GFR: 45.81 mL/min — ABNORMAL LOW (ref >60.00)
Glucose, Bld: 100 mg/dL — ABNORMAL HIGH (ref 70–99)
Potassium: 4.5 mEq/L (ref 3.5–5.1)
Sodium: 139 mEq/L (ref 135–145)
Total Bilirubin: 1 mg/dL (ref 0.2–1.2)
Total Protein: 7.4 g/dL (ref 6.0–8.3)

## 2021-03-21 MED ORDER — FAMOTIDINE 20 MG PO TABS: ORAL_TABLET | ORAL | 1 refills | Status: DC

## 2021-03-21 NOTE — Progress Notes (Signed)
Noted  

## 2021-03-21 NOTE — Patient Instructions (Signed)
You will be contacted by [facility] (Your caller ID will indicate phone # [phone]) in the next 7 days to schedule your Abdominal/pelvis CT Scan. If you have not heard from them within 7 business days, please call [facility] at [phone] to follow up on the status of your appointment.    We have provided you with 2 bottles of oral contrast. Please take them home and place in the refrigerator. You will be given the instructions when you are contacted to schedule the CT scan.  Please proceed to the basement level for lab work before leaving today. Press "B" on the elevator. The lab is located at the first door on the left as you exit the elevator.  HEALTHCARE LAWS AND MY CHART RESULTS:   Due to recent changes in healthcare laws, you may see results of your imaging and/or laboratory studies on MyChart before I have had a chance to review them.  I understand that in some cases there may be results that are confusing or concerning to you. Please understand that not all results are received at the same time and often I may need to interpret multiple results in order to provide you with the best plan of care or course of treatment. Therefore, I ask that you please give me 48 hours to thoroughly review all your results before contacting my office for clarification.   We have sent the following medication to your pharmacy for you to pick up at your convenience:  Famotidine 20 MG tablet, take 1 tablet each evening before dinner.  Go to the emergency room if you develop severe abdominal pain.  Thank you for trusting me with your gastrointestinal care!    Noralyn Pick, CRNP    BMI:  If you are age 24 or older, your body mass index should be between 23-30. Your Body mass index is 25.4 kg/m. If this is out of the aforementioned range listed, please consider follow up with your Primary Care Provider.  If you are age 59 or younger, your body  mass index should be between 19-25. Your Body mass index is 25.4 kg/m. If this is out of the aformentioned range listed, please consider follow up with your Primary Care Provider.   MY CHART:  The [facility] providers would like to encourage you to use [facility] to communicate with providers for non-urgent requests or questions.  Due to long hold times on the telephone, sending your provider a message by [facility] may be a faster and more efficient way to get a response.  Please allow 48 business hours for a response.  Please remember that this is for non-urgent requests.

## 2021-03-24 ENCOUNTER — Other Ambulatory Visit: Payer: Self-pay

## 2021-03-24 ENCOUNTER — Ambulatory Visit (HOSPITAL_COMMUNITY)
Admission: RE | Admit: 2021-03-24 | Discharge: 2021-03-24 | Disposition: A | Discharge status: Home / Self Care | Payer: Medicare Other | Source: Ambulatory Visit | Attending: Nurse Practitioner | Admitting: Nurse Practitioner

## 2021-03-24 DIAGNOSIS — R1030 Lower abdominal pain, unspecified: Secondary | ICD-10-CM | POA: Insufficient documentation

## 2021-03-24 DIAGNOSIS — N200 Calculus of kidney: Secondary | ICD-10-CM | POA: Diagnosis not present

## 2021-03-24 DIAGNOSIS — R109 Unspecified abdominal pain: Secondary | ICD-10-CM | POA: Diagnosis not present

## 2021-03-24 DIAGNOSIS — R1010 Upper abdominal pain, unspecified: Secondary | ICD-10-CM | POA: Diagnosis not present

## 2021-03-24 DIAGNOSIS — I700 Atherosclerosis of aorta: Secondary | ICD-10-CM | POA: Diagnosis not present

## 2021-03-24 MED ORDER — IOHEXOL 300 MG/ML  SOLN
75.0000 mL | Freq: Once | INTRAMUSCULAR | Status: AC | PRN
  Administered 2021-03-24: 75 mL via INTRAVENOUS

## 2021-03-24 MED ORDER — SODIUM CHLORIDE (PF) 0.9 % IJ SOLN
INTRAMUSCULAR | Status: AC
  Filled 2021-03-24: qty 50

## 2021-03-25 DIAGNOSIS — H1131 Conjunctival hemorrhage, right eye: Secondary | ICD-10-CM | POA: Diagnosis not present

## 2021-03-30 ENCOUNTER — Other Ambulatory Visit: Payer: Self-pay | Admitting: Cardiology

## 2021-03-30 DIAGNOSIS — Z9861 Coronary angioplasty status: Secondary | ICD-10-CM

## 2021-03-30 DIAGNOSIS — E785 Hyperlipidemia, unspecified: Secondary | ICD-10-CM

## 2021-03-30 NOTE — Progress Notes (Signed)
Cardiology Clinic Note   Patient Name: [name] [name] Date of Encounter: 04/04/2021  Primary Care Provider:  Cyndi Bender, PA-C Primary Cardiologist:  Kirk Ruths, MD  Patient Profile    [name] [name] 81 year old male presents to the clinic today for follow-up evaluation of his coronary artery disease and essential hypertension.  Past Medical History    Past Medical History:  Diagnosis Date   Anemia 10/06/2011   Anginal pain ([place])    Arthritis    "joints hurt; shoulders, arms, back" (08/13/2013)   Atrial fibrillation ([facility])    B12 deficiency    Carotid stenosis 04/10/11   a. s/p left carotid endarterectomy 04/14/2011.;  b.  Carotid US (11/13):  L CEA ok; RICA 1-39%   Chronic bronchitis ([place])    "get it q yr"   Chronic chest pain    Chronic lower back pain    Colon polyp    adenomatous   Coronary artery disease    a. S/p CABG in 2001. b. S/p DES to protected LM and BMS to RCA 2006. c. 12/2009: s/p DES to Cx;  d. 09/2011 Cath: patent stents, patent grafts -->Med Rx.;  e. CP with abnl Nuc => LHC 10/04/12: oLM 30%, dLM stent into the CFX patent, LAD occluded, LIMA-LAD patent, RI 30%, mid AVCFX 30%, oOM1 50%, oRCA stent patent, mRCA 50-60%, Radial graft-Dx patent; EF 55%.=> Med Rx.  f. stent to LM & Circ,   Daily headache    "for the last couple weeks" (08/13/2013)   Diverticulosis    Esophageal stricture    GERD (gastroesophageal reflux disease)    Hemorrhoids    Hiatal hernia    Hyperlipidemia    Hypertension    Left bundle branch block    Pancreatitis Dec 2011   ERCP ok.   Prostate cancer [facility])    s/p cryoablation   Renal cyst    Seen on CT 08/2011 also with circumferential bladder wall thickening   Skin cancer    "cut it off my right ear"   Statin intolerance    Past Surgical History:  Procedure Laterality Date   CARDIAC CATHETERIZATION     CHOLECYSTECTOMY     COLONOSCOPY     CORONARY ANGIOPLASTY WITH STENT PLACEMENT     "1 + 1"   CORONARY ARTERY BYPASS GRAFT   2001   CORONARY STENT INTERVENTION N/A 09/11/2017   Procedure: CORONARY STENT INTERVENTION;  Surgeon: Lorretta Harp, MD;  Location: [place] CV LAB;  Service: Cardiovascular;  Laterality: N/A;   CORONARY STENT PLACEMENT  09/11/2017   Ost Cx to Prox Cx lesion is 95% stenosed.   ENDARTERECTOMY  04/14/2011   Procedure: ENDARTERECTOMY CAROTID;  Surgeon: Mal Misty, MD;  Location: [place];  Service: Vascular;  Laterality: Left;  Would like to perform procedure first, at [place] Bilateral    LEFT HEART CATH AND CORS/GRAFTS ANGIOGRAPHY N/A 09/11/2017   Procedure: LEFT HEART CATH AND CORS/GRAFTS ANGIOGRAPHY;  Surgeon: Lorretta Harp, MD;  Location: [place] CV LAB;  Service: Cardiovascular;  Laterality: N/A;   LEFT HEART CATHETERIZATION WITH CORONARY/GRAFT ANGIOGRAM N/A 10/05/2011   Procedure: LEFT HEART CATHETERIZATION WITH Beatrix Fetters;  Surgeon: Hillary Bow, MD;  Location: [facility] CATH LAB;  Service: Cardiovascular;  Laterality: N/A;   LEFT HEART CATHETERIZATION WITH CORONARY/GRAFT ANGIOGRAM N/A 10/04/2012   Procedure: LEFT HEART CATHETERIZATION WITH Beatrix Fetters;  Surgeon: Jolaine Artist, MD;  Location: [place] CATH LAB;  Service: Cardiovascular;  Laterality: N/A;   POLYPECTOMY     PR VEIN BYPASS GRAFT,AORTO-FEM-POP     PROSTATE CRYOABLATION      Allergies  Allergies  Allergen Reactions   Shellfish Allergy Anaphylaxis    Other reaction(s): Other (See Comments)   Zolpidem Tartrate Other (See Comments)    hallucinations   Zolpidem Tartrate     Other reaction(s): Other (See Comments) hallucinations   Brilinta [Ticagrelor]    Amoxicillin-Pot Clavulanate Other (See Comments)    REACTION: 'Burns' Stomach   Amoxicillin-Pot Clavulanate     Other reaction(s): Other (See Comments) REACTION: 'Burns' Stomach   Atorvastatin    Codeine Nausea And Vomiting   Erythromycin Diarrhea, Nausea And Vomiting and Other  (See Comments)    All mycins cause upset stomach Other reaction(s): GI Upset (intolerance), Other (See Comments) All mycins cause upset stomach   Hydrocodone Nausea And Vomiting   Morphine Nausea And Vomiting   Nitrofuran Derivatives Other (See Comments)    Unknown reaction unknown   Oxycodone Hcl Nausea And Vomiting   Tramadol Nausea And Vomiting   Zocor [Simvastatin]     myalgia    History of Present Illness    [name] [name] has a PMH of essential hypertension, LBBB, carotid stenosis, coronary artery disease status post PCA, CABG 2001, GERD, AKI, chronic renal disease stage III, anxiety, memory loss, and hyperlipidemia.  He has had multiple PCI's since his CABG.  Abdominal ultrasound 8/18 showed no aneurysm.  Cardiac catheterization 8/19 showed 60% RCA, 100% LAD, 95% left main, 95% circumflex, patent radial graft-ramus, patent LIMA-LAD, underwent PCI of his left main and circumflex.  Echocardiogram 8/19 showed normal LV function, G2 DD, mild left atrial enlargement, trace aortic insufficiency, and mitral valve regurgitation.  His ABIs 11/19 were normal.  His carotid Dopplers 12/20 showed 40-59% right ICA stenosis, and 1-39% left ICA stenosis.  His vascular disease is followed by vascular surgery.  He was seen by Dr. [name] on 12/01/2019.  During that time he denied dyspnea on exertion, orthopnea, lower extremity swelling, PND, and syncope.  He did occasionally note chest discomfort after eating.  He denied exertional chest discomfort.  He presents to the clinic today for follow-up evaluation states he has noticed some pain at the top of his stomach and when he sneezes.  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  He reports that he enjoys being outside.  He does notice some increased work of breathing with increased physical activity but denies increased work of breathing at rest.  We reviewed his previous echocardiogram.  He expressed understanding.  He states that he is  unable to afford Repatha.  We will get him the patient assistance paperwork, given high-fiber diet information, he may take Tylenol as needed for musculoskeletal pain, and we will have him follow-up in 1 year.  I have encouraged him to continue his physical activity.  Today he denies chest pain, shortness of breath, lower extremity edema, fatigue, palpitations, melena, hematuria, hemoptysis, diaphoresis, weakness, presyncope, syncope, orthopnea, and PND.   Home Medications    Prior to Admission medications   Medication Sig Start Date End Date Taking? Authorizing Provider  amLODipine (NORVASC) 10 MG tablet Take 10 mg by mouth daily.    [provider]  aspirin EC 81 MG tablet Take 81 mg by mouth at bedtime.     [provider]  Evolocumab (REPATHA SURECLICK) 782 MG/ML SOAJ Inject 140 mg into the skin  every 14 (fourteen) days. Patient not taking: Reported on 03/21/2021 01/21/20   Lelon Perla, MD  famotidine (PEPCID) 20 MG tablet Take 1 tablet every day before dinner. 03/21/21   Noralyn Pick, NP  isosorbide mononitrate (IMDUR) 30 MG 24 hr tablet TAKE 1 TABLET (30 MG TOTAL) BY MOUTH DAILY. ** DO NOT CRUSH ** 02/18/21   Lelon Perla, MD  LORazepam (ATIVAN) 0.5 MG tablet Take 0.5 mg by mouth daily as needed for anxiety. 07/17/16   [provider]  losartan (COZAAR) 50 MG tablet Take 1 tablet (50 mg total) by mouth daily. Patient taking differently: Take 50 mg by mouth as needed. 12/01/19 03/21/21  Lelon Perla, MD  metoCLOPramide (REGLAN) 10 MG tablet Take 10 mg by mouth daily as needed for nausea.    [provider]  metoprolol tartrate (LOPRESSOR) 50 MG tablet Take 50 mg by mouth 2 (two) times daily. 04/06/19   [provider]  nitroGLYCERIN (NITROSTAT) 0.4 MG SL tablet Place 1 tablet (0.4 mg total) under the tongue every 5 (five) minutes as needed. For chest pain Patient not taking: Reported on 03/21/2021 09/13/17   Lelon Perla,  MD  pantoprazole (PROTONIX) 40 MG tablet Take 1 tablet (40 mg total) by mouth daily. 09/12/17   Daune Perch, NP  PARoxetine (PAXIL) 20 MG tablet Take 20 mg by mouth at bedtime.  04/18/17   [provider]    Family History    Family History  Problem Relation Age of Onset   Heart disease Mother        Heart Disease before age 11   Hypertension Mother    Heart attack Mother    Lung cancer Father    Hypertension Sister    Heart disease Sister        Heart Disease before age 56   Cancer Sister    Heart attack Sister    Hypertension Brother    Heart disease Brother        Heart Disease before age 73   Heart attack Brother    Colon cancer Neg Hx    Esophageal cancer Neg Hx    Pancreatic cancer Neg Hx    Stomach cancer Neg Hx    He indicated that his mother is deceased. He indicated that his father is deceased. He indicated that his sister is deceased. He indicated that his brother is deceased. He indicated that his maternal grandmother is deceased. He indicated that his maternal grandfather is deceased. He indicated that his paternal grandmother is deceased. He indicated that his paternal grandfather is deceased. He indicated that his daughter is alive. He indicated that the status of his neg hx is unknown.  Social History    Social History   Socioeconomic History   Marital status: Widowed    Spouse name: Not on file   Number of children: 2   Years of education: Not on file   Highest education level: Not on file  Occupational History   Occupation: Retired    Comment: raised Sales promotion account executive  Tobacco Use   Smoking status: Former    Years: 0.10    Types: Cigarettes   Smokeless tobacco: Current    Types: Chew   Tobacco comments:    "smoked a few cigarettes; no more than 1 month"  Vaping Use   Vaping Use: Never used  Substance and Sexual Activity   Alcohol use: No    Alcohol/week: 0.0 standard drinks    Comment: "no alcohol  since I was a teenager"   Drug use: No    Sexual activity: Not Currently  Other Topics Concern   Not on file  Social History Narrative   Patient is illiterate. He cannot read or write. He left school at about the seventh grade.   As of 10/2015 he reports that his wife is chronically ill at home with heart disease and COPD and is under hospice care. The bulk of the care is given by the patient and daughter.   Social Determinants of Health   Financial Resource Strain: Not on file  Food Insecurity: Not on file  Transportation Needs: Not on file  Physical Activity: Not on file  Stress: Not on file  Social Connections: Not on file  Intimate Partner Violence: Not on file     Review of Systems    General:  No chills, fever, night sweats or weight changes.  Cardiovascular:  No chest pain, dyspnea on exertion, edema, orthopnea, palpitations, paroxysmal nocturnal dyspnea. Dermatological: No rash, lesions/masses Respiratory: No cough, dyspnea Urologic: No hematuria, dysuria Abdominal:   No nausea, vomiting, diarrhea, bright red blood per rectum, melena, or hematemesis Neurologic:  No visual changes, wkns, changes in mental status. All other systems reviewed and are otherwise negative except as noted above.  Physical Exam    VS:  BP 138/64 (BP Location: Left Arm, Patient Position: Sitting, Cuff Size: Normal)    Pulse (!) 55    Ht 5' 2.5" (1.588 m)    Wt 142 lb (64.4 kg)    BMI 25.56 kg/m  , BMI Body mass index is 25.56 kg/m. GEN: Well nourished, well developed, in no acute distress. HEENT: normal. Neck: Supple, no JVD, carotid bruits, or masses. Cardiac: RRR, no murmurs, rubs, or gallops. No clubbing, cyanosis, edema.  Radials/DP/PT 2+ and equal bilaterally.  Respiratory:  Respirations regular and unlabored, clear to auscultation bilaterally. GI: Soft, nontender, nondistended, BS + x 4. MS: no deformity or atrophy. Skin: warm and dry, no rash. Neuro:  Strength and sensation are intact. Psych: Normal affect.  Accessory  Clinical Findings    Recent Labs: 03/21/2021: ALT 9; BUN 23; Creatinine, Ser 1.44; Hemoglobin 12.5; Platelets 183.0; Potassium 4.5; Sodium 139   Recent Lipid Panel    Component Value Date/Time   CHOL 167 01/31/2018 1154   TRIG 111 01/31/2018 1154   HDL 39 (L) 01/31/2018 1154   CHOLHDL 4.3 01/31/2018 1154   CHOLHDL 7.4 10/03/2012 0543   VLDL 43 (H) 10/03/2012 0543   LDLCALC 106 (H) 01/31/2018 1154   LDLDIRECT 178.8 04/02/2007 0906    ECG personally reviewed by me today-sinus bradycardia left axis deviation left bundle branch block 55 bpm- No acute changes  Echocardiogram 09/10/2017  Study Conclusions   - Left ventricle: The cavity size was normal. There was moderate    concentric hypertrophy. Systolic function was normal. The    estimated ejection fraction was in the range of 60% to 65%. Wall    motion was normal; there were no regional wall motion    abnormalities. Features are consistent with a pseudonormal left    ventricular filling pattern, with concomitant abnormal relaxation    and increased filling pressure (grade 2 diastolic dysfunction).  - Aortic valve: Trileaflet; mildly thickened, mildly calcified    leaflets. There was trivial regurgitation.  - Mitral valve: There was trivial regurgitation.  - Left atrium: The atrium was mildly dilated.  - Tricuspid valve: There was trivial regurgitation.  - Pulmonary arteries: PA peak pressure: 32  mm Hg (S).  Assessment & Plan   1.  Coronary artery disease-no recent episodes of arm neck back or chest discomfort. Cardiac catheterization 8/19 showed 60% RCA, 100% LAD, 95% left main, 95% circumflex, patent radial graft-ramus, patent LIMA-LAD, underwent PCI of his left main and circumflex.  He is statin intolerant.  Plavix previously discontinued Continue amlodipine, aspirin, Imdur, Repatha, metoprolol, nitroglycerin as needed Heart healthy low-sodium diet Increase physical activity as tolerated  Hyperlipidemia-LDL 73.  Statin  intolerant Continue Repatha, aspirin Heart healthy low-sodium high-fiber diet Increase physical activity as tolerated We will give patient assistance paperwork for Repatha  Essential hypertension-BP today 138/64.  Well-controlled at home 130s over 39s. Continue amlodipine, metoprolol, losartan Heart healthy low-sodium diet-salty 6 given Increase physical activity as tolerated  Carotid artery stenosis-denies recent headaches, presyncope, syncope, lightheadedness.  Carotid Doppler 03/09/2021 showed right ICA 40-59% stenosis, left ICA 1-39% stenosis Follows with vascular surgery  Disposition: Follow-up with Dr. [name] or me in 1 year.  [name] Ng. Orbin Mayeux NP-C    04/04/2021, 3:26 PM [address] Office [phone] Fax [phone]  Notice: This dictation was prepared with Dragon dictation along with smaller phrase technology. Any transcriptional errors that result from this process are unintentional and may not be corrected upon review.  I spent 13 minutes examining this patient, reviewing medications, and using patient centered shared decision making involving her cardiac care.  Prior to her visit I spent greater than 20 minutes reviewing her past medical history,  medications, and prior cardiac tests.

## 2021-03-31 MED ORDER — REPATHA SURECLICK 140 MG/ML ~~LOC~~ SOAJ: 140.0000 mg | SUBCUTANEOUS | 3 refills | Status: DC

## 2021-03-31 NOTE — Addendum Note (Signed)
Addended by: Rollen Sox on: 03/31/2021 07:25 AM ? ? Modules accepted: Orders ? ?

## 2021-04-04 ENCOUNTER — Other Ambulatory Visit: Payer: Self-pay

## 2021-04-04 ENCOUNTER — Ambulatory Visit (INDEPENDENT_AMBULATORY_CARE_PROVIDER_SITE_OTHER): Payer: Medicare Other | Admitting: General Practice

## 2021-04-04 ENCOUNTER — Other Ambulatory Visit: Payer: Self-pay | Admitting: Cardiology

## 2021-04-04 ENCOUNTER — Encounter: Payer: Self-pay | Admitting: General Practice

## 2021-04-04 VITALS — BP 138/64 | HR 55 | Ht 62.5 in | Wt 142.0 lb

## 2021-04-04 DIAGNOSIS — I2510 Atherosclerotic heart disease of native coronary artery without angina pectoris: Secondary | ICD-10-CM | POA: Diagnosis not present

## 2021-04-04 DIAGNOSIS — I10 Essential (primary) hypertension: Secondary | ICD-10-CM | POA: Diagnosis not present

## 2021-04-04 DIAGNOSIS — E785 Hyperlipidemia, unspecified: Secondary | ICD-10-CM

## 2021-04-04 DIAGNOSIS — Z9861 Coronary angioplasty status: Secondary | ICD-10-CM

## 2021-04-04 DIAGNOSIS — I6523 Occlusion and stenosis of bilateral carotid arteries: Secondary | ICD-10-CM

## 2021-04-04 NOTE — Patient Instructions (Signed)
Medication Instructions:  ?The current medical regimen is effective;  continue present plan and medications as directed. Please refer to the Current Medication list given to you today.  ? ?*If you need a refill on your cardiac medications before your next appointment, please call your pharmacy* ? ?Lab Work:   Testing/Procedures:  ?NONE    NONE ? ?Special Instructions ?PLEASE READ AND FOLLOW INCREASED FIBER DIET-ATTACHED ? ?PLEASE INCREASE PHYSICAL ACTIVITY AS TOLERATED  ? ?MAY TAKE TYLENOL FOR MUSCLE ACHES ? ?YOUR REPATHA IS ALREADY APPROVED FOR THIS YEAR BUT, YOU STILL HAVE TO PAY YOUR $500 DEDUCTIBLE BEFORE THEY WILL PAY. ? ?Follow-Up: ?Your next appointment:  12 month(s) In Person with Kirk Ruths, MD   ? ?Please call our office 2 months in advance to schedule this appointment  :1 ? ?At [facility], you and your health needs are our priority.  As part of our continuing mission to provide you with exceptional heart care, we have created designated Provider Care Teams.  These Care Teams include your primary Cardiologist (physician) and Advanced Practice Providers (APPs -  Physician Assistants and Nurse Practitioners) who all work together to provide you with the care you need, when you need it. ? ? ? ?High-Fiber Eating Plan ?Fiber, also called dietary fiber, is a type of carbohydrate. It is found foods such as fruits, vegetables, whole grains, and beans. A high-fiber diet can have many health benefits. Your health care provider may recommend a high-fiber diet to help: ?Prevent constipation. Fiber can make your bowel movements more regular. ?Lower your cholesterol. ?Relieve the following conditions: ?Inflammation of veins in the anus (hemorrhoids). ?Inflammation of specific areas of the digestive tract (uncomplicated diverticulosis). ?A problem of the large intestine, also called the colon, that sometimes causes pain and diarrhea (irritable bowel syndrome, or IBS). ?Prevent overeating as part of a weight-loss  plan. ?Prevent heart disease, type 2 diabetes, and certain cancers. ?What are tips for following this plan? ?Reading food labels ? ?Check the nutrition facts label on food products for the amount of dietary fiber. Choose foods that have 5 grams of fiber or more per serving. ?The goals for recommended daily fiber intake include: ?Men (age 59 or younger): 34-38 g. ?Men (over age 58): 28-34 g. ?Women (age 9 or younger): 25-28 g. ?Women (over age 29): 22-25 g. ? ?Shopping ?Choose whole fruits and vegetables instead of processed forms, such as apple juice or applesauce. ?Choose a wide variety of high-fiber foods such as avocados, lentils, oats, and kidney beans. ?Read the nutrition facts label of the foods you choose. Be aware of foods with added fiber. These foods often have high sugar and sodium amounts per serving. ?Cooking ?Use whole-grain flour for baking and cooking. ?Cook with brown rice instead of white rice. ?Meal planning ?Start the day with a breakfast that is high in fiber, such as a cereal that contains 5 g of fiber or more per serving. ?Eat breads and cereals that are made with whole-grain flour instead of refined flour or white flour. ?Eat brown rice, bulgur wheat, or millet instead of white rice. ?Use beans in place of meat in soups, salads, and pasta dishes. ?Be sure that half of the grains you eat each day are whole grains. ?General information ?You can get the recommended daily intake of dietary fiber by: ?Eating a variety of fruits, vegetables, grains, nuts, and beans. ?Taking a fiber supplement if you are not able to take in enough fiber in your diet. It is better to get  fiber through food than from a supplement. ?Gradually increase how much fiber you consume. If you increase your intake of dietary fiber too quickly, you may have bloating, cramping, or gas. ?Drink plenty of water to help you digest fiber. ?Choose high-fiber snacks, such as berries, raw vegetables, nuts, and popcorn. ?What foods  should I eat? ?Fruits ?Berries. Pears. Apples. Oranges. Avocado. Prunes and raisins. Dried figs. ?Vegetables ?Sweet potatoes. Spinach. Kale. Artichokes. Cabbage. Broccoli. Cauliflower. Green peas. Carrots. Squash. ?Grains ?Whole-grain breads. Multigrain cereal. Oats and oatmeal. Brown rice. Barley. Bulgur wheat. [place]. Quinoa. Bran muffins. Popcorn. Rye wafer crackers. ?Meats and other proteins ?Navy beans, kidney beans, and pinto beans. Soybeans. Split peas. Lentils. Nuts and seeds. ?Dairy ?Fiber-fortified yogurt. ?Beverages ?Fiber-fortified soy milk. Fiber-fortified orange juice. ?Other foods ?Fiber bars. ?The items listed above may not be a complete list of recommended foods and beverages. Contact a dietitian for more information. ?What foods should I avoid? ?Fruits ?Fruit juice. Cooked, strained fruit. ?Vegetables ?Fried potatoes. Canned vegetables. Well-cooked vegetables. ?Grains ?White bread. Pasta made with refined flour. White rice. ?Meats and other proteins ?Fatty cuts of meat. Fried chicken or fried fish. ?Dairy ?Milk. Yogurt. Cream cheese. Sour cream. ?Fats and oils ?Butters. ?Beverages ?Soft drinks. ?Other foods ?Cakes and pastries. ?The items listed above may not be a complete list of foods and beverages to avoid. Talk with your dietitian about what choices are best for you. ?Summary ?Fiber is a type of carbohydrate. It is found in foods such as fruits, vegetables, whole grains, and beans. ?A high-fiber diet has many benefits. It can help to prevent constipation, lower blood cholesterol, aid weight loss, and reduce your risk of heart disease, diabetes, and certain cancers. ?Increase your intake of fiber gradually. Increasing fiber too quickly may cause cramping, bloating, and gas. Drink plenty of water while you increase the amount of fiber you consume. ?The best sources of fiber include whole fruits and vegetables, whole grains, nuts, seeds, and beans. ?This information is not intended to replace  advice given to you by your health care provider. Make sure you discuss any questions you have with your health care provider. ?Document Revised: 05/15/2019 Document Reviewed: 05/15/2019 ?Elsevier Patient Education ? 2022 [place]. ? ? ?

## 2021-04-21 ENCOUNTER — Other Ambulatory Visit: Payer: Self-pay | Admitting: Nurse Practitioner

## 2021-04-27 ENCOUNTER — Other Ambulatory Visit: Payer: Self-pay | Admitting: Cardiology

## 2021-04-27 DIAGNOSIS — G4762 Sleep related leg cramps: Secondary | ICD-10-CM | POA: Diagnosis not present

## 2021-04-27 DIAGNOSIS — F419 Anxiety disorder, unspecified: Secondary | ICD-10-CM | POA: Diagnosis not present

## 2021-04-27 DIAGNOSIS — G8929 Other chronic pain: Secondary | ICD-10-CM | POA: Diagnosis not present

## 2021-04-27 DIAGNOSIS — I739 Peripheral vascular disease, unspecified: Secondary | ICD-10-CM | POA: Diagnosis not present

## 2021-04-27 DIAGNOSIS — E7800 Pure hypercholesterolemia, unspecified: Secondary | ICD-10-CM | POA: Diagnosis not present

## 2021-04-27 DIAGNOSIS — I679 Cerebrovascular disease, unspecified: Secondary | ICD-10-CM | POA: Diagnosis not present

## 2021-04-27 DIAGNOSIS — E538 Deficiency of other specified B group vitamins: Secondary | ICD-10-CM | POA: Diagnosis not present

## 2021-04-27 DIAGNOSIS — Z1331 Encounter for screening for depression: Secondary | ICD-10-CM | POA: Diagnosis not present

## 2021-04-27 DIAGNOSIS — R109 Unspecified abdominal pain: Secondary | ICD-10-CM | POA: Diagnosis not present

## 2021-04-27 DIAGNOSIS — I10 Essential (primary) hypertension: Secondary | ICD-10-CM | POA: Diagnosis not present

## 2021-04-27 DIAGNOSIS — I25119 Atherosclerotic heart disease of native coronary artery with unspecified angina pectoris: Secondary | ICD-10-CM | POA: Diagnosis not present

## 2021-04-27 DIAGNOSIS — R413 Other amnesia: Secondary | ICD-10-CM | POA: Diagnosis not present

## 2021-05-05 ENCOUNTER — Other Ambulatory Visit: Payer: Self-pay | Admitting: Nurse Practitioner

## 2021-05-07 DIAGNOSIS — Z79899 Other long term (current) drug therapy: Secondary | ICD-10-CM | POA: Diagnosis not present

## 2021-05-07 DIAGNOSIS — R079 Chest pain, unspecified: Secondary | ICD-10-CM | POA: Diagnosis not present

## 2021-05-07 DIAGNOSIS — Z7982 Long term (current) use of aspirin: Secondary | ICD-10-CM | POA: Diagnosis not present

## 2021-05-07 DIAGNOSIS — R519 Headache, unspecified: Secondary | ICD-10-CM | POA: Diagnosis not present

## 2021-05-07 DIAGNOSIS — R112 Nausea with vomiting, unspecified: Secondary | ICD-10-CM | POA: Diagnosis not present

## 2021-05-07 DIAGNOSIS — R197 Diarrhea, unspecified: Secondary | ICD-10-CM | POA: Diagnosis not present

## 2021-05-07 DIAGNOSIS — E860 Dehydration: Secondary | ICD-10-CM | POA: Diagnosis not present

## 2021-05-07 DIAGNOSIS — R531 Weakness: Secondary | ICD-10-CM | POA: Diagnosis not present

## 2021-05-16 DIAGNOSIS — R197 Diarrhea, unspecified: Secondary | ICD-10-CM | POA: Diagnosis not present

## 2021-05-18 DIAGNOSIS — Z139 Encounter for screening, unspecified: Secondary | ICD-10-CM | POA: Diagnosis not present

## 2021-05-18 DIAGNOSIS — E7800 Pure hypercholesterolemia, unspecified: Secondary | ICD-10-CM | POA: Diagnosis not present

## 2021-05-18 DIAGNOSIS — Z9181 History of falling: Secondary | ICD-10-CM | POA: Diagnosis not present

## 2021-05-18 DIAGNOSIS — R197 Diarrhea, unspecified: Secondary | ICD-10-CM | POA: Diagnosis not present

## 2021-05-25 DIAGNOSIS — R700 Elevated erythrocyte sedimentation rate: Secondary | ICD-10-CM | POA: Diagnosis not present

## 2021-05-25 DIAGNOSIS — Z6824 Body mass index (BMI) 24.0-24.9, adult: Secondary | ICD-10-CM | POA: Diagnosis not present

## 2021-05-25 DIAGNOSIS — R197 Diarrhea, unspecified: Secondary | ICD-10-CM | POA: Diagnosis not present

## 2021-06-01 ENCOUNTER — Encounter: Payer: Self-pay | Admitting: *Deleted

## 2021-06-06 ENCOUNTER — Telehealth: Payer: Self-pay | Admitting: Psychiatry

## 2021-06-06 ENCOUNTER — Ambulatory Visit (INDEPENDENT_AMBULATORY_CARE_PROVIDER_SITE_OTHER): Payer: Medicare Other | Admitting: Psychiatry

## 2021-06-06 ENCOUNTER — Encounter: Payer: Self-pay | Admitting: Psychiatry

## 2021-06-06 VITALS — BP 146/67 | HR 67 | Ht 65.0 in | Wt 139.0 lb

## 2021-06-06 DIAGNOSIS — I639 Cerebral infarction, unspecified: Secondary | ICD-10-CM | POA: Diagnosis not present

## 2021-06-06 DIAGNOSIS — L258 Unspecified contact dermatitis due to other agents: Secondary | ICD-10-CM | POA: Diagnosis not present

## 2021-06-06 DIAGNOSIS — R413 Other amnesia: Secondary | ICD-10-CM

## 2021-06-06 NOTE — Telephone Encounter (Signed)
Medicare order sent to [facility], [facility] they will reach out to the patient to schedule.  °

## 2021-06-06 NOTE — Telephone Encounter (Signed)
Referral for Neuropsychology sent to [facility] [phone]. 

## 2021-06-06 NOTE — Progress Notes (Signed)
? ?[facility] ? ?PATIENT: [name] [name] ?DOB: Sep 01, 1940 ? ?REFERRING CLINICIAN: Cyndi Bender, PA-C ?HISTORY FROM: self, daughter ?REASON FOR VISIT: memory loss ? ? ?HISTORICAL ? ?CHIEF COMPLAINT:  ?Chief Complaint  ?Patient presents with  ? Memory Loss  ?  Rm 8 with daughter [name] [name]  ?Pt is well, has been having memory lost for about 5 yrs. States he forgets everything then it will come back to him later on   ? ? ?HISTORY OF PRESENT ILLNESS:  ?The patient presents for evaluation of memory loss which has been progressively worsening over the past 5 years. Patient states he noticed memory changes following a head trauma in 1992. He will forget conversations he had earlier that day and word finding difficulty. He has gotten lost driving at night time a few times. Otherwise feels he is functioning pretty well. Daughter reports no concerns. ? ?Feels like the room is spinning around him sometimes. He got dizzy and fell 2 months ago. States he saw ENT previously who did not think he had vertigo. Has a history of carotid stenosis s/p CEA, this is being monitored by vascular surgery. ? ? ?TBI: Prior head trauma in 1992 ?Stroke: Remote lacunar infarcts ?Seizures:  no past history of seizures ?Sleep: Wakes up frequently to use the bathroom, no other issues ?Mood:  patient has a history of anxiety, takes Paxil ? ?Functional status:  ?Patient lives alone. ?Cooking: has occasionally left the stove on. Misplaces objects in the kitchen ?Cleaning: misplaces objects around the house ?Shopping: daughter does the shopping ?Driving: Gets lost when driving at night ?Bills: daughter takes care of the bills because he is illiterate ?Medications: Occasionally misses doses but will remember to take them later ?Ever left the stove on by accident?: yes ?Forget how to use items around the house?: no ?Getting lost going to familiar places?: yes ?Forgetting loved ones names?: no ?Word finding difficulty? yes ? ?OTHER  MEDICAL CONDITIONS: HTN, LBBB, carotid stenosis s/p CEA, CAD, CKD III, HLD ? ? ?REVIEW OF SYSTEMS: Full 14 system review of systems performed and negative with exception of: memory loss, dizziness ? ?ALLERGIES: ?Allergies  ?Allergen Reactions  ? Shellfish Allergy Anaphylaxis  ?  Other reaction(s): Other (See Comments)  ? Zolpidem Tartrate Other (See Comments)  ?  hallucinations  ? Zolpidem Tartrate   ?  Other reaction(s): Other (See Comments) ?hallucinations  ? Brilinta [Ticagrelor]   ? Amoxicillin-Pot Clavulanate Other (See Comments)  ?  REACTION: 'Burns' Stomach  ? Amoxicillin-Pot Clavulanate   ?  Other reaction(s): Other (See Comments) ?REACTION: 'Burns' Stomach  ? Atorvastatin   ? Codeine Nausea And Vomiting  ? Erythromycin Diarrhea, Nausea And Vomiting and Other (See Comments)  ?  All mycins cause upset stomach ?Other reaction(s): GI Upset (intolerance), Other (See Comments) ?All mycins cause upset stomach  ? Hydrocodone Nausea And Vomiting  ? Morphine Nausea And Vomiting  ? Nitrofuran Derivatives Other (See Comments)  ?  Unknown reaction ?unknown  ? Oxycodone Hcl Nausea And Vomiting  ? Tramadol Nausea And Vomiting  ? Zocor [Simvastatin]   ?  myalgia  ? ? ?HOME MEDICATIONS: ?Outpatient Medications Prior to Visit  ?Medication Sig Dispense Refill  ? amLODipine (NORVASC) 10 MG tablet Take 10 mg by mouth daily.    ? aspirin EC 81 MG tablet Take 81 mg by mouth at bedtime.     ? isosorbide mononitrate (IMDUR) 30 MG 24 hr tablet TAKE 1 TABLET (30 MG TOTAL) BY MOUTH DAILY. ** DO NOT CRUSH **  90 tablet 3  ? LORazepam (ATIVAN) 0.5 MG tablet Take 0.5 mg by mouth daily as needed for anxiety.  2  ? losartan (COZAAR) 50 MG tablet Take 1 tablet (50 mg total) by mouth daily. (Patient taking differently: Take 50 mg by mouth as needed.) 90 tablet 3  ? metoCLOPramide (REGLAN) 10 MG tablet Take 10 mg by mouth daily as needed for nausea.    ? metoprolol tartrate (LOPRESSOR) 50 MG tablet Take 50 mg by mouth 2 (two) times daily.    ?  nitroGLYCERIN (NITROSTAT) 0.4 MG SL tablet Place 1 tablet (0.4 mg total) under the tongue every 5 (five) minutes as needed. For chest pain 25 tablet 11  ? pantoprazole (PROTONIX) 40 MG tablet Take 1 tablet (40 mg total) by mouth daily. 30 tablet 1  ? PARoxetine (PAXIL) 20 MG tablet Take 20 mg by mouth at bedtime.   5  ? REPATHA SURECLICK 361 MG/ML SOAJ INJECT 140 MG INTO THE SKIN EVERY 14 (FOURTEEN) DAYS. 2 mL 11  ? famotidine (PEPCID) 20 MG tablet TAKE 1 TABLET EVERY DAY BEFORE DINNER. (Patient not taking: Reported on 06/06/2021) 90 tablet 1  ? ?No facility-administered medications prior to visit.  ? ? ?PAST MEDICAL HISTORY: ?Past Medical History:  ?Diagnosis Date  ? Anemia 10/06/2011  ? Anginal pain ([place])   ? Arthritis   ? "joints hurt; shoulders, arms, back" (08/13/2013)  ? Atrial fibrillation ([place])   ? B12 deficiency   ? Carotid stenosis 04/10/2011  ? a. s/p left carotid endarterectomy 04/14/2011.;  b.  Carotid US (11/13):  L CEA ok; RICA 1-39%  ? Chronic bronchitis ([place])   ? "get it q yr"  ? Chronic chest pain   ? Chronic lower back pain   ? Colon polyp   ? adenomatous  ? Coronary artery disease   ? a. S/p CABG in 2001. b. S/p DES to protected LM and BMS to RCA 2006. c. 12/2009: s/p DES to Cx;  d. 09/2011 Cath: patent stents, patent grafts -->Med Rx.;  e. CP with abnl Nuc => LHC 10/04/12: oLM 30%, dLM stent into the CFX patent, LAD occluded, LIMA-LAD patent, RI 30%, mid AVCFX 30%, oOM1 50%, oRCA stent patent, mRCA 50-60%, Radial graft-Dx patent; EF 55%.=> Med Rx.  f. stent to LM & Circ,  ? Daily headache   ? "for the last couple weeks" (08/13/2013)  ? Diverticulosis   ? Esophageal stricture   ? GERD (gastroesophageal reflux disease)   ? Hemorrhoids   ? Hiatal hernia   ? Hyperlipidemia   ? Hypertension   ? Left bundle branch block   ? Memory deficit   ? Pancreatitis 12/2009  ? ERCP ok.  ? Prostate cancer [facility])   ? s/p cryoablation  ? Renal cyst   ? Seen on CT 08/2011 also with circumferential bladder wall thickening  ?  Skin cancer   ? "cut it off my right ear"  ? Statin intolerance   ? ? ?PAST SURGICAL HISTORY: ?Past Surgical History:  ?Procedure Laterality Date  ? CARDIAC CATHETERIZATION    ? CHOLECYSTECTOMY    ? COLONOSCOPY    ? CORONARY ANGIOPLASTY WITH STENT PLACEMENT    ? "1 + 1"  ? CORONARY ARTERY BYPASS GRAFT  2001  ? CORONARY STENT INTERVENTION N/A 09/11/2017  ? Procedure: CORONARY STENT INTERVENTION;  Surgeon: Lorretta Harp, MD;  Location: [place] [facility];  Service: Cardiovascular;  Laterality: N/A;  ? CORONARY STENT PLACEMENT  09/11/2017  ? ?Colon Flattery  Cx to Prox Cx lesion is 95% stenosed.  ? ENDARTERECTOMY  04/14/2011  ? Procedure: ENDARTERECTOMY CAROTID;  Surgeon: Mal Misty, MD;  Location: [facility];  Service: Vascular;  Laterality: Left;  Would like to perform procedure first, at 0730  ? EYE SURGERY    ? cataract  ? INGUINAL HERNIA REPAIR Bilateral   ? LEFT HEART CATH AND CORS/GRAFTS ANGIOGRAPHY N/A 09/11/2017  ? Procedure: LEFT HEART CATH AND CORS/GRAFTS ANGIOGRAPHY;  Surgeon: Lorretta Harp, MD;  Location: [facility];  Service: Cardiovascular;  Laterality: N/A;  ? LEFT HEART CATHETERIZATION WITH CORONARY/GRAFT ANGIOGRAM N/A 10/05/2011  ? Procedure: LEFT HEART CATHETERIZATION WITH Beatrix Fetters;  Surgeon: Hillary Bow, MD;  Location: [facility];  Service: Cardiovascular;  Laterality: N/A;  ? LEFT HEART CATHETERIZATION WITH CORONARY/GRAFT ANGIOGRAM N/A 10/04/2012  ? Procedure: LEFT HEART CATHETERIZATION WITH Beatrix Fetters;  Surgeon: Jolaine Artist, MD;  Location: [facility] CATH LAB;  Service: Cardiovascular;  Laterality: N/A;  ? POLYPECTOMY    ? PR VEIN BYPASS GRAFT,AORTO-FEM-POP    ? PROSTATE CRYOABLATION    ? ? ?FAMILY HISTORY: ?Family History  ?Problem Relation Age of Onset  ? Heart disease Mother   ?     Heart Disease before age 73  ? Hypertension Mother   ? Heart attack Mother   ? Lung cancer Father   ? Hypertension Sister   ? Heart disease Sister   ?     Heart Disease before age 71   ? Cancer Sister   ? Heart attack Sister   ? Hypertension Brother   ? Heart disease Brother   ?     Heart Disease before age 30  ? Heart attack Brother   ? Colon cancer Neg Hx   ? Esophageal cancer Neg

## 2021-06-06 NOTE — Patient Instructions (Addendum)
Neuropsychological testing (more extensive memory testing) ?CT brain, CTA of the blood vessels in the brain ? ? Preventing Falls at Home  ?Falls are common, often dreaded events in the lives of older people. Aside from the obvious injuries and even death that may result, fall can cause wide-ranging consequences including loss of independence, mental decline, decreased activity and mobility. Younger people are also at risk of falling, especially those with chronic illnesses and fatigue.  ?Ways to reduce risk for falling  ? Examine diet and medications. Warm foods and alcohol dilate blood vessels, which can lead to dizziness when standing. Sleep aids, antidepressants and pain medications can also increase the likelihood of a fall.  ? Get a vision exam. Poor vision, cataracts and glaucoma increase the chances of falling.  ? Check foot gear. Shoes should fit snugly and have a sturdy, nonskid sole and a broad, low heel  ? Participate in a physician-approved exercise program to build and maintain muscle strength and improve balance and coordination. Programs that use ankle weights or stretch bands are excellent for muscle-strengthening. Water aerobics programs and low-impact Tai Chi programs have also been shown to improve balance and coordination.  ? Increase vitamin D intake. Vitamin D improves muscle strength and increases the amount of calcium the body is able to absorb and deposit in bones.  ?How to prevent falls from common hazards  ? Floors -- Remove all loose wires, cords, and throw rugs. Minimize clutter. Make sure rugs are anchored and smooth. Keep furniture in its usual place.  ? Chairs -- Use chairs with straight backs, armrests and firm seats. Add firm cushions to existing pieces to add height.  ? Bathroom -- Install grab bars and non-skid tape in the tub or shower. Use a bathtub transfer bench or a shower chair with a back support Use an elevated toilet seat and/or safety rails to assist standing from a low  surface. Do not use towel racks or bathroom tissue holders to help you stand.  ? Lighting -- Make sure halls, stairways, and entrances are well-lit. Install a night light in your bathroom or hallway. Make sure there is a light switch at the top and bottom of the staircase. Turn lights on if you get up in the middle of the night. Make sure lamps or light switches are within reach of the bed if you have to get up during the night.  ? Kitchen -- Install non-skid rubber mats near the sink and stove. Clean spills immediately. Store frequently used utensils, pots, pans between waist and eye level. This helps prevent reaching and bending. Sit when getting things out of lower cupboards.  ? Living room / [place] furniture with wide spaces in between, giving enough room to move around. Establish a route through the living room that gives you something to hold onto as you walk.  ? Stairs -- Make sure treads, rails, and rugs are secure. Install a rail on both sides of the stairs. If stairs are a threat, it might be helpful to arrange most of your activities on the lower level to reduce the number of times you must climb the stairs.  ? Entrances and doorways -- Install metal handles on the walls adjacent to the doorknobs of all doors to make it more secure as you travel through the doorway.  ? ?Tips for maintaining balance  ? Keep at least one hand free at all times. Try using a backpack or fanny pack to hold things rather than  carrying them in your hands. Never carry objects in both hands when walking as this interferes with keeping your balance.  ? Attempt to swing both arms from front to back while walking. This might require a conscious effort if Parkinson's disease has diminished your movement. It will, however, help you to maintain balance and posture, and reduce fatigue.  ? Consciously lift your feet off of the ground when walking. Shuffling and dragging of the feet is a common culprit in losing your balance.  ?  When trying to navigate turns, use a "U" technique of facing forward and making a wide turn, rather than pivoting sharply.  ? Try to stand with your feet shoulder-length apart. When your feet are close together for any length of time, you increase your risk of losing your balance and falling.  ? Do one thing at a time. Don't try to walk and accomplish another task, such as reading or looking around. The decrease in your automatic reflexes complicates motor function, so the less distraction, the better.  ? Do not wear rubber or gripping soled shoes, they might "catch" on the floor and cause tripping.  ? Move slowly when changing positions. Use deliberate, concentrated movements and, if needed, use a grab bar or walking aid. Count 15 seconds between each movement. For example, when rising from a seated position, wait 15 seconds after standing to begin walking.  ? If balance is a continuous problem, you might want to consider a walking aid such as a cane, walking stick, or walker. Once you've mastered walking with help, you might be ready to try it on your own again.  ? ? ?

## 2021-06-07 LAB — VITAMIN B12: Vitamin B-12: 661 pg/mL (ref 232–1245)

## 2021-06-07 LAB — TSH: TSH: 1.8 u[IU]/mL (ref 0.450–4.500)

## 2021-06-23 NOTE — Telephone Encounter (Signed)
Intake/Interview: 09/05/2021 Testing: 09/05/2021 Feedback: 09/19/2021

## 2021-07-04 ENCOUNTER — Ambulatory Visit
Admission: RE | Admit: 2021-07-04 | Discharge: 2021-07-04 | Disposition: A | Discharge status: Home / Self Care | Payer: Medicare Other | Source: Ambulatory Visit | Attending: Psychiatry | Admitting: Psychiatry

## 2021-07-04 DIAGNOSIS — I639 Cerebral infarction, unspecified: Secondary | ICD-10-CM

## 2021-07-04 MED ORDER — IOPAMIDOL (ISOVUE-370) INJECTION 76%
60.0000 mL | Freq: Once | INTRAVENOUS | Status: AC | PRN
  Administered 2021-07-04: 60 mL via INTRAVENOUS

## 2021-11-04 DIAGNOSIS — I10 Essential (primary) hypertension: Secondary | ICD-10-CM | POA: Diagnosis not present

## 2021-11-04 DIAGNOSIS — G4762 Sleep related leg cramps: Secondary | ICD-10-CM | POA: Diagnosis not present

## 2021-11-04 DIAGNOSIS — I679 Cerebrovascular disease, unspecified: Secondary | ICD-10-CM | POA: Diagnosis not present

## 2021-11-04 DIAGNOSIS — E7800 Pure hypercholesterolemia, unspecified: Secondary | ICD-10-CM | POA: Diagnosis not present

## 2021-11-04 DIAGNOSIS — R197 Diarrhea, unspecified: Secondary | ICD-10-CM | POA: Diagnosis not present

## 2021-11-04 DIAGNOSIS — F419 Anxiety disorder, unspecified: Secondary | ICD-10-CM | POA: Diagnosis not present

## 2021-11-04 DIAGNOSIS — Z23 Encounter for immunization: Secondary | ICD-10-CM | POA: Diagnosis not present

## 2021-11-04 DIAGNOSIS — G8929 Other chronic pain: Secondary | ICD-10-CM | POA: Diagnosis not present

## 2021-11-04 DIAGNOSIS — I739 Peripheral vascular disease, unspecified: Secondary | ICD-10-CM | POA: Diagnosis not present

## 2021-11-04 DIAGNOSIS — K219 Gastro-esophageal reflux disease without esophagitis: Secondary | ICD-10-CM | POA: Diagnosis not present

## 2021-11-04 DIAGNOSIS — R109 Unspecified abdominal pain: Secondary | ICD-10-CM | POA: Diagnosis not present

## 2021-11-04 DIAGNOSIS — I25119 Atherosclerotic heart disease of native coronary artery with unspecified angina pectoris: Secondary | ICD-10-CM | POA: Diagnosis not present

## 2021-12-05 ENCOUNTER — Ambulatory Visit: Payer: Medicare Other | Admitting: Psychiatry

## 2021-12-05 ENCOUNTER — Encounter: Payer: Self-pay | Admitting: Psychiatry

## 2021-12-05 DIAGNOSIS — G8929 Other chronic pain: Secondary | ICD-10-CM | POA: Diagnosis not present

## 2021-12-05 DIAGNOSIS — I25119 Atherosclerotic heart disease of native coronary artery with unspecified angina pectoris: Secondary | ICD-10-CM | POA: Diagnosis not present

## 2021-12-05 DIAGNOSIS — R197 Diarrhea, unspecified: Secondary | ICD-10-CM | POA: Diagnosis not present

## 2021-12-05 DIAGNOSIS — R109 Unspecified abdominal pain: Secondary | ICD-10-CM | POA: Diagnosis not present

## 2021-12-05 DIAGNOSIS — K219 Gastro-esophageal reflux disease without esophagitis: Secondary | ICD-10-CM | POA: Diagnosis not present

## 2021-12-05 NOTE — Progress Notes (Deleted)
   CC:  memory loss  Follow-up Visit  Last visit: 06/06/21  Brief HPI: 81 year old male with a history of HTN, LBBB, carotid stenosis s/p CEA, CAD, CKD III, HLD who follows in clinic for memory loss.  At his last visit he was referred for neuropsychological testing. CTH and CTA head/neck were ordered.  Interval History: CTH 07/04/21 showed atrophy with chronic microvascular changes. It did also show small remote lacunar infarcts. CTA head/neck showed 50% stenosis on the right carotid and 60% stenosis on the left carotid. 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 He is taking ASA 81 mg daily and Repatha for stroke prevention.  Memory***Neuropsych***   Physical Exam:   Vital Signs: There were no vitals taken for this visit. GENERAL:  well appearing, in no acute distress, alert  SKIN:  Color, texture, turgor normal. No rashes or lesions HEAD:  Normocephalic/atraumatic. RESP: normal respiratory effort MSK:  No gross joint deformities.   NEUROLOGICAL: Mental Status: Alert, oriented to person, place and time, Follows commands, and Speech fluent and appropriate. Cranial Nerves: PERRL, face symmetric, no dysarthria, hearing grossly intact Motor: moves all extremities equally Gait: normal-based.  IMPRESSION: ***  PLAN: ***   Follow-up: ***  I spent a total of *** minutes on the date of the service. Discussed medication side effects, adverse reactions and drug interactions. Written educational materials and patient instructions outlining all of the above were given.  Genia Harold, MD

## 2022-02-10 ENCOUNTER — Other Ambulatory Visit (HOSPITAL_COMMUNITY): Payer: Self-pay

## 2022-03-01 DIAGNOSIS — E538 Deficiency of other specified B group vitamins: Secondary | ICD-10-CM | POA: Diagnosis not present

## 2022-03-01 DIAGNOSIS — R519 Headache, unspecified: Secondary | ICD-10-CM | POA: Diagnosis not present

## 2022-03-01 DIAGNOSIS — G8929 Other chronic pain: Secondary | ICD-10-CM | POA: Diagnosis not present

## 2022-03-01 DIAGNOSIS — I679 Cerebrovascular disease, unspecified: Secondary | ICD-10-CM | POA: Diagnosis not present

## 2022-03-01 DIAGNOSIS — I25119 Atherosclerotic heart disease of native coronary artery with unspecified angina pectoris: Secondary | ICD-10-CM | POA: Diagnosis not present

## 2022-03-01 DIAGNOSIS — R42 Dizziness and giddiness: Secondary | ICD-10-CM | POA: Diagnosis not present

## 2022-03-04 ENCOUNTER — Inpatient Hospital Stay (HOSPITAL_COMMUNITY)
Admission: RE | Admit: 2022-03-04 | Discharge: 2022-03-07 | DRG: 322 | Disposition: A | Discharge status: Home / Self Care | Payer: Medicare Other | Attending: Cardiology | Admitting: Cardiology

## 2022-03-04 ENCOUNTER — Encounter (HOSPITAL_COMMUNITY): Payer: Self-pay

## 2022-03-04 DIAGNOSIS — I2510 Atherosclerotic heart disease of native coronary artery without angina pectoris: Secondary | ICD-10-CM | POA: Diagnosis present

## 2022-03-04 DIAGNOSIS — G8929 Other chronic pain: Secondary | ICD-10-CM | POA: Diagnosis present

## 2022-03-04 DIAGNOSIS — Z881 Allergy status to other antibiotic agents status: Secondary | ICD-10-CM

## 2022-03-04 DIAGNOSIS — Z880 Allergy status to penicillin: Secondary | ICD-10-CM

## 2022-03-04 DIAGNOSIS — I6523 Occlusion and stenosis of bilateral carotid arteries: Secondary | ICD-10-CM | POA: Diagnosis present

## 2022-03-04 DIAGNOSIS — I10 Essential (primary) hypertension: Secondary | ICD-10-CM | POA: Diagnosis present

## 2022-03-04 DIAGNOSIS — F32A Depression, unspecified: Secondary | ICD-10-CM | POA: Diagnosis present

## 2022-03-04 DIAGNOSIS — I454 Nonspecific intraventricular block: Secondary | ICD-10-CM | POA: Diagnosis not present

## 2022-03-04 DIAGNOSIS — Z888 Allergy status to other drugs, medicaments and biological substances status: Secondary | ICD-10-CM

## 2022-03-04 DIAGNOSIS — L251 Unspecified contact dermatitis due to drugs in contact with skin: Secondary | ICD-10-CM | POA: Diagnosis not present

## 2022-03-04 DIAGNOSIS — E785 Hyperlipidemia, unspecified: Secondary | ICD-10-CM | POA: Diagnosis not present

## 2022-03-04 DIAGNOSIS — I4891 Unspecified atrial fibrillation: Secondary | ICD-10-CM | POA: Diagnosis present

## 2022-03-04 DIAGNOSIS — I129 Hypertensive chronic kidney disease with stage 1 through stage 4 chronic kidney disease, or unspecified chronic kidney disease: Secondary | ICD-10-CM | POA: Diagnosis present

## 2022-03-04 DIAGNOSIS — Y831 Surgical operation with implant of artificial internal device as the cause of abnormal reaction of the patient, or of later complication, without mention of misadventure at the time of the procedure: Secondary | ICD-10-CM | POA: Diagnosis present

## 2022-03-04 DIAGNOSIS — I493 Ventricular premature depolarization: Secondary | ICD-10-CM | POA: Diagnosis not present

## 2022-03-04 DIAGNOSIS — Z951 Presence of aortocoronary bypass graft: Secondary | ICD-10-CM

## 2022-03-04 DIAGNOSIS — K219 Gastro-esophageal reflux disease without esophagitis: Secondary | ICD-10-CM | POA: Diagnosis present

## 2022-03-04 DIAGNOSIS — Z885 Allergy status to narcotic agent status: Secondary | ICD-10-CM

## 2022-03-04 DIAGNOSIS — Z7982 Long term (current) use of aspirin: Secondary | ICD-10-CM

## 2022-03-04 DIAGNOSIS — N1831 Chronic kidney disease, stage 3a: Secondary | ICD-10-CM | POA: Diagnosis present

## 2022-03-04 DIAGNOSIS — Z8249 Family history of ischemic heart disease and other diseases of the circulatory system: Secondary | ICD-10-CM

## 2022-03-04 DIAGNOSIS — Z8546 Personal history of malignant neoplasm of prostate: Secondary | ICD-10-CM | POA: Diagnosis not present

## 2022-03-04 DIAGNOSIS — Z955 Presence of coronary angioplasty implant and graft: Secondary | ICD-10-CM | POA: Diagnosis not present

## 2022-03-04 DIAGNOSIS — I4519 Other right bundle-branch block: Secondary | ICD-10-CM | POA: Diagnosis not present

## 2022-03-04 DIAGNOSIS — Z85828 Personal history of other malignant neoplasm of skin: Secondary | ICD-10-CM

## 2022-03-04 DIAGNOSIS — I200 Unstable angina: Secondary | ICD-10-CM | POA: Diagnosis not present

## 2022-03-04 DIAGNOSIS — R9431 Abnormal electrocardiogram [ECG] [EKG]: Secondary | ICD-10-CM | POA: Diagnosis not present

## 2022-03-04 DIAGNOSIS — M545 Low back pain, unspecified: Secondary | ICD-10-CM | POA: Diagnosis present

## 2022-03-04 DIAGNOSIS — I447 Left bundle-branch block, unspecified: Secondary | ICD-10-CM | POA: Diagnosis present

## 2022-03-04 DIAGNOSIS — Z79899 Other long term (current) drug therapy: Secondary | ICD-10-CM

## 2022-03-04 DIAGNOSIS — E7800 Pure hypercholesterolemia, unspecified: Secondary | ICD-10-CM | POA: Diagnosis present

## 2022-03-04 DIAGNOSIS — T82855A Stenosis of coronary artery stent, initial encounter: Secondary | ICD-10-CM | POA: Diagnosis present

## 2022-03-04 DIAGNOSIS — Z91013 Allergy to seafood: Secondary | ICD-10-CM | POA: Diagnosis not present

## 2022-03-04 DIAGNOSIS — R079 Chest pain, unspecified: Secondary | ICD-10-CM | POA: Diagnosis not present

## 2022-03-04 DIAGNOSIS — R001 Bradycardia, unspecified: Secondary | ICD-10-CM | POA: Diagnosis not present

## 2022-03-04 DIAGNOSIS — I25110 Atherosclerotic heart disease of native coronary artery with unstable angina pectoris: Secondary | ICD-10-CM | POA: Diagnosis present

## 2022-03-04 DIAGNOSIS — Z801 Family history of malignant neoplasm of trachea, bronchus and lung: Secondary | ICD-10-CM

## 2022-03-04 DIAGNOSIS — I2583 Coronary atherosclerosis due to lipid rich plaque: Secondary | ICD-10-CM | POA: Diagnosis present

## 2022-03-04 DIAGNOSIS — I639 Cerebral infarction, unspecified: Secondary | ICD-10-CM | POA: Diagnosis not present

## 2022-03-04 NOTE — H&P (Signed)
Cardiology Admission History and Physical:   Patient ID: [name] [name] MRN: [id number]; DOB: [birth year]   Admission date: 03/04/2022  Primary Care Provider: Cyndi Bender, PA-C [facility] Cardiologist: Kirk Ruths, MD  [facility] Electrophysiologist:  None   Chief Complaint:  Chest pain  Patient Profile:   [name] [name] is a 82 y.o. male with CAD status post remote CABG and subsequent multiple DES stents, GERD, esophageal stricture, hyperlipidemia, hypertension, chronic left bundle branch block, atrial fibrillation, chronic chest pain, carotid stenosis who was seen at [facility] emergency room for chest pain and is now transferred to [facility] for further evaluation  History of Present Illness:   [name] [name] is an 82 year old male with an extensive history of CAD.  He underwent CABG in 2001 with LIMA to LAD and radial graft to ramus.  He has had multiple PCI since his CABG.  He underwent protected left main DES and bare-metal stent to RCA in 2006.  Status post DES to left circumflex 12/2009 and cath 10/13/2011 showed patent stents and patent grafts and medical therapy was recommended at that time for chest pain.  He had an abnormal nuclear stress test for chest pain in 2014 showing an ostial left main 30%, distal left main stent into the left circumflex patent, occluded LAD with patent LIMA to the LAD, 30% ramus intermedius, 30% mid AV left circumflex, 50% OM1, patent stent in the ostial RCA, 50 to 60% mid RCA and radial graft to ramus patent and medical therapy recommended.   Cath 8/19 showed patent ostial RCA stent, 60% mRCA, 100% pLAD, 95% dLM in-stent restenosis, 95% oLM in-stent restenosis and patent radial graft to the ramus and patent LIMA to the LAD.  At that time he underwent PCI of his left main and circumflex.  2D echo at that time showed normal LV function and grade 2 diastolic dysfunction.  He underwent PCI of the left main and left circumflex in-stent  restenosis.    He also has a history of chronic chest pain, atrial fibrillation, carotid artery stenosis status post left CEA in 2013 (carotid Dopplers 03/09/2021 showed 40 to 59% right ICA stenosis and 1 to 39% left ICA stenosis followed by vascular surgery), GERD, esophageal stricture, hiatal hernia, hyperlipidemia, hypertension, left bundle branch block and he is statin intolerant.  He was last seen in the office By Coletta Memos, NP on 04/04/2021 and was doing well.  He had been on Plavix previously but that was stopped.  At that time he was on aspirin Imdur Repatha metoprolol and as needed nitroglycerin as well as amlodipine.  He has been on Repatha as well.  He was in his usual state of health until   Past Medical History:  Diagnosis Date   Anemia 10/06/2011   Anginal pain (HCC)    Arthritis    "joints hurt; shoulders, arms, back" (08/13/2013)   Atrial fibrillation ([place])    B12 deficiency    Carotid stenosis 04/10/2011   a. s/p left carotid endarterectomy 04/14/2011.;  b.  Carotid US (11/13):  L CEA ok; RICA 1-39%   Chronic bronchitis ([place])    "get it q yr"   Chronic chest pain    Chronic lower back pain    Colon polyp    adenomatous   Coronary artery disease    a. S/p CABG in 2001. b. S/p DES to protected LM and BMS to RCA 2006. c. 12/2009: s/p DES to Cx;  d. 09/2011 Cath: patent stents, patent  grafts -->Med Rx.;  e. CP with abnl Nuc => LHC 10/04/12: oLM 30%, dLM stent into the CFX patent, LAD occluded, LIMA-LAD patent, RI 30%, mid AVCFX 30%, oOM1 50%, oRCA stent patent, mRCA 50-60%, Radial graft-Dx patent; EF 55%.=> Med Rx.  f. stent to LM & Circ,   Daily headache    "for the last couple weeks" (08/13/2013)   Diverticulosis    Esophageal stricture    GERD (gastroesophageal reflux disease)    Hemorrhoids    Hiatal hernia    Hyperlipidemia    Hypertension    Left bundle branch block    Memory deficit    Pancreatitis 12/2009   ERCP ok.   Prostate cancer [facility])    s/p  cryoablation   Renal cyst    Seen on CT 08/2011 also with circumferential bladder wall thickening   Skin cancer    "cut it off my right ear"   Statin intolerance     Past Surgical History:  Procedure Laterality Date   CARDIAC CATHETERIZATION     CHOLECYSTECTOMY     COLONOSCOPY     CORONARY ANGIOPLASTY WITH STENT PLACEMENT     "1 + 1"   CORONARY ARTERY BYPASS GRAFT  2001   CORONARY STENT INTERVENTION N/A 09/11/2017   Procedure: CORONARY STENT INTERVENTION;  Surgeon: Lorretta Harp, MD;  Location: [place] CV LAB;  Service: Cardiovascular;  Laterality: N/A;   CORONARY STENT PLACEMENT  09/11/2017   Ost Cx to Prox Cx lesion is 95% stenosed.   ENDARTERECTOMY  04/14/2011   Procedure: ENDARTERECTOMY CAROTID;  Surgeon: Mal Misty, MD;  Location: [place];  Service: Vascular;  Laterality: Left;  Would like to perform procedure first, at [place] Bilateral    LEFT HEART CATH AND CORS/GRAFTS ANGIOGRAPHY N/A 09/11/2017   Procedure: LEFT HEART CATH AND CORS/GRAFTS ANGIOGRAPHY;  Surgeon: Lorretta Harp, MD;  Location: [place] CV LAB;  Service: Cardiovascular;  Laterality: N/A;   LEFT HEART CATHETERIZATION WITH CORONARY/GRAFT ANGIOGRAM N/A 10/05/2011   Procedure: LEFT HEART CATHETERIZATION WITH Beatrix Fetters;  Surgeon: Hillary Bow, MD;  Location: [facility] CATH LAB;  Service: Cardiovascular;  Laterality: N/A;   LEFT HEART CATHETERIZATION WITH CORONARY/GRAFT ANGIOGRAM N/A 10/04/2012   Procedure: LEFT HEART CATHETERIZATION WITH Beatrix Fetters;  Surgeon: Jolaine Artist, MD;  Location: [facility] CATH LAB;  Service: Cardiovascular;  Laterality: N/A;   POLYPECTOMY     PR VEIN BYPASS GRAFT,AORTO-FEM-POP     PROSTATE CRYOABLATION       Medications Prior to Admission: Prior to Admission medications   Medication Sig Start Date End Date Taking? Authorizing Provider  amLODipine (NORVASC) 10 MG tablet Take 10 mg by mouth daily.     [provider]  aspirin EC 81 MG tablet Take 81 mg by mouth at bedtime.     [provider]  isosorbide mononitrate (IMDUR) 30 MG 24 hr tablet TAKE 1 TABLET (30 MG TOTAL) BY MOUTH DAILY. ** DO NOT CRUSH ** 04/27/21   Lelon Perla, MD  LORazepam (ATIVAN) 0.5 MG tablet Take 0.5 mg by mouth daily as needed for anxiety. 07/17/16   [provider]  losartan (COZAAR) 50 MG tablet Take 1 tablet (50 mg total) by mouth daily. Patient taking differently: Take 50 mg by mouth as needed. 12/01/19 06/06/21  Lelon Perla, MD  metoCLOPramide (REGLAN) 10 MG tablet Take 10 mg by mouth daily as needed for nausea.  [provider]  metoprolol tartrate (LOPRESSOR) 50 MG tablet Take 50 mg by mouth 2 (two) times daily. 04/06/19   [provider]  nitroGLYCERIN (NITROSTAT) 0.4 MG SL tablet Place 1 tablet (0.4 mg total) under the tongue every 5 (five) minutes as needed. For chest pain 09/13/17   Lelon Perla, MD  pantoprazole (PROTONIX) 40 MG tablet Take 1 tablet (40 mg total) by mouth daily. 09/12/17   Daune Perch, NP  PARoxetine (PAXIL) 20 MG tablet Take 20 mg by mouth at bedtime.  04/18/17   [provider]  REPATHA SURECLICK [id number] MG/ML SOAJ INJECT 140 MG INTO THE SKIN EVERY 14 (FOURTEEN) DAYS. 04/04/21   Lelon Perla, MD     Allergies:    Allergies  Allergen Reactions   Shellfish Allergy Anaphylaxis    Other reaction(s): Other (See Comments)   Zolpidem Tartrate Other (See Comments)    hallucinations   Zolpidem Tartrate     Other reaction(s): Other (See Comments) hallucinations   Brilinta [Ticagrelor]    Amoxicillin-Pot Clavulanate Other (See Comments)    REACTION: 'Burns' Stomach   Amoxicillin-Pot Clavulanate     Other reaction(s): Other (See Comments) REACTION: 'Burns' Stomach   Atorvastatin    Codeine Nausea And Vomiting   Erythromycin Diarrhea, Nausea And Vomiting and Other (See Comments)    All mycins cause upset stomach Other  reaction(s): GI Upset (intolerance), Other (See Comments) All mycins cause upset stomach   Hydrocodone Nausea And Vomiting   Morphine Nausea And Vomiting   Nitrofuran Derivatives Other (See Comments)    Unknown reaction unknown   Oxycodone Hcl Nausea And Vomiting   Tramadol Nausea And Vomiting   Zocor [Simvastatin]     myalgia    Social History:   Social History   Socioeconomic History   Marital status: Widowed    Spouse name: Not on file   Number of children: 2   Years of education: Not on file   Highest education level: Not on file  Occupational History   Occupation: Retired    Comment: raised Sales promotion account executive  Tobacco Use   Smoking status: Former    Years: 0.10    Types: Cigarettes   Smokeless tobacco: Current    Types: Chew   Tobacco comments:    "smoked a few cigarettes; no more than 1 month"  Vaping Use   Vaping Use: Never used  Substance and Sexual Activity   Alcohol use: No    Alcohol/week: 0.0 standard drinks of alcohol    Comment: "no alcohol since I was a teenager"   Drug use: No   Sexual activity: Not Currently  Other Topics Concern   Not on file  Social History Narrative   Patient is illiterate. He cannot read or write. He left school at about the seventh grade.   As of 10/2015 he reports that his wife is chronically ill at home with heart disease and COPD and is under hospice care. The bulk of the care is given by the patient and daughter.   Social Determinants of Health   Financial Resource Strain: Not on file  Food Insecurity: Not on file  Transportation Needs: Not on file  Physical Activity: Not on file  Stress: Not on file  Social Connections: Not on file  Intimate Partner Violence: Not on file    Family History:   The patient's family history includes Cancer in his sister; Heart attack in his brother, mother, and sister; Heart disease in his brother, mother, and sister;  Hypertension in his brother, mother, and sister; Lung cancer in his father.  There is no history of Colon cancer, Esophageal cancer, Pancreatic cancer, or Stomach cancer.    ROS:  Please see the history of present illness.  All other ROS reviewed and negative.     Physical Exam/Data:   Vitals:   03/04/22 2115  BP: 124/64  Pulse: (!) 57  Resp: 15  Temp: 97.7 F (36.5 C)  TempSrc: Oral  SpO2: 100%   No intake or output data in the 24 hours ending 03/04/22 2304    06/06/2021   11:29 AM 04/04/2021    3:08 PM 03/21/2021    2:24 PM  Last 3 Weights  Weight (lbs) 139 lb 142 lb 141 lb 2 oz  Weight (kg) 63.05 kg 64.411 kg 64.014 kg     There is no height or weight on file to calculate BMI.  General:  Well nourished, well developed, in no acute distress HEENT: normal Lymph: no adenopathy Neck: no\ JVD Endocrine:  No thryomegaly Vascular: No carotid bruits; FA pulses 2+ bilaterally without bruits  Cardiac:  normal S1, S2; RRR; no murmur \ Lungs:  clear to auscultation bilaterally, no wheezing, rhonchi or rales  Abd: soft, nontender, no hepatomegaly  Ext: no edema Musculoskeletal:  No deformities, BUE and BLE strength normal and equal Skin: warm and dry  Neuro:  CNs 2-12 intact, no focal abnormalities noted Psych:  Normal affect    EKG:  The ECG that was done  was personally reviewed and demonstrates   Relevant CV Studies: Cardiac Cath 09/11/2017 Conclusion  Previously placed Ost RCA to Prox RCA stent (unknown type) is widely patent. Mid RCA lesion is 60% stenosed. Ost LAD to Prox LAD lesion is 100% stenosed. Ost LM to Dist LM lesion is 95% stenosed. Ost Cx to Prox Cx lesion is 95% stenosed. A stent was successfully placed. Post intervention, there is a 0% residual stenosis. A stent was successfully placed. Post intervention, there is a 0% residual stenosis.  Diagnostic Dominance: Right  Intervention      Echocardiogram 09/10/2017 Study Conclusions   - Left ventricle: The cavity size was normal. There was moderate    concentric  hypertrophy. Systolic function was normal. The    estimated ejection fraction was in the range of 60% to 65%. Wall    motion was normal; there were no regional wall motion    abnormalities. Features are consistent with a pseudonormal left    ventricular filling pattern, with concomitant abnormal relaxation    and increased filling pressure (grade 2 diastolic dysfunction).  - Aortic valve: Trileaflet; mildly thickened, mildly calcified    leaflets. There was trivial regurgitation.  - Mitral valve: There was trivial regurgitation.  - Left atrium: The atrium was mildly dilated.  - Tricuspid valve: There was trivial regurgitation.  - Pulmonary arteries: PA peak pressure: 32 mm Hg (S).  Laboratory Data:  High Sensitivity Troponin:  No results for input(s): "TROPONINIHS" in the last 720 hours.    ChemistryNo results for input(s): "NA", "K", "CL", "CO2", "GLUCOSE", "BUN", "CREATININE", "CALCIUM", "GFRNONAA", "GFRAA", "ANIONGAP" in the last 168 hours.  No results for input(s): "PROT", "ALBUMIN", "AST", "ALT", "ALKPHOS", "BILITOT" in the last 168 hours. HematologyNo results for input(s): "WBC", "RBC", "HGB", "HCT", "MCV", "MCH", "MCHC", "RDW", "PLT" in the last 168 hours. BNPNo results for input(s): "BNP", "PROBNP" in the last 168 hours.  DDimer No results for input(s): "DDIMER" in the last 168 hours.   Radiology/Studies:  No results  found.   Assessment and Plan:   Coronary artery disease -CABG in 2001 with LIMA to LAD and radial graft to ramus.   -He has had multiple PCI since his CABG.   -He underwent protected left main DES and bare-metal stent to RCA in 2006.   -Status post DES to left circumflex 12/2009 and cath 10/13/2011 showed patent stents and patent grafts and medical therapy was recommended at that time for chest pain.   -He had an abnormal nuclear stress test for chest pain in 2014  and cath showing an ostial left main 30%, distal left main stent into the left circumflex patent,  occluded LAD with patent LIMA to the LAD, 30% ramus intermedius, 30% mid AV left circumflex, 50% OM1, patent stent in the ostial RCA, 50 to 60% mid RCA and radial graft to ramus patent and medical therapy recommended.  -recurrent CP with Cath 8/19 showed patent ostial RCA stent, 60% mRCA, 100% pLAD, 95% dLM in-stent restenosis, 95% oLM in-stent restenosis and patent radial graft to the ramus and patent LIMA to the LAD.  At that time he underwent PCI of his left main and circumflex for in-stent restenosis.   95% circumflex, patent radial graft-ramus, patent LIMA-LAD, underwent PCI of his left main and circumflex. -He is statin intolerant and on Repatha -Plavix had been discontinued a while back -now admitted to [facility] with CP and normal hs Troponin and no ischemia on EKG -continue ASA 38m daily, Amlodipine 117mdaily, Repatha, IMdur 3070maily, Lopressor 78m65mD -he currently has minimal 2/10 CP -start IV NTG gtt and continue IV Heparin gtt  Hyperlipidemia -LDL 73.   -Statin intolerant -continue Repatha -repeat FLP in am -Heart healthy low-sodium high-fiber diet   Essential hypertension -BP controlled -continue amlodipine 10mg9mly, Lopressor 78mg 15m Losartan 78mg d28m   Carotid artery stenosis -carotid Doppler 03/09/2021 showed right ICA 40-59% stenosis, left ICA 1-39% stenosis -Follows with vascular surgery -continue ASA and Repatha  {Complete the following score calculators/questions which help meet required metrics.  Press F2         :[id number] Risk Score for Unstable Angina or Non-ST Elevation MI:   The patient's TIMI risk score is  , which indicates a  % risk of all cause mortality, new or recurrent myocardial infarction or need for urgent revascularization in the next 14 days.{ Click here to calculate score  REFRESH Note before signing  :1}      Severity of Illness: The appropriate patient status for this patient is OBSERVATION. Observation status is  judged to be reasonable and necessary in order to provide the required intensity of service to ensure the patient's safety. The patient's presenting symptoms, physical exam findings, and initial radiographic and laboratory data in the context of their medical condition is felt to place them at decreased risk for further clinical deterioration. Furthermore, it is anticipated that the patient will be medically stable for discharge from the hospital within 2 midnights of admission.    For questions or updates, please contact [facility] consult [email or web address] for contact info under     Signed, Mariyana Fulop TFransico Him/10/2022 11:04 PM

## 2022-03-05 DIAGNOSIS — I10 Essential (primary) hypertension: Secondary | ICD-10-CM

## 2022-03-05 DIAGNOSIS — I2583 Coronary atherosclerosis due to lipid rich plaque: Secondary | ICD-10-CM

## 2022-03-05 DIAGNOSIS — I6523 Occlusion and stenosis of bilateral carotid arteries: Secondary | ICD-10-CM

## 2022-03-05 DIAGNOSIS — E7800 Pure hypercholesterolemia, unspecified: Secondary | ICD-10-CM | POA: Diagnosis not present

## 2022-03-05 DIAGNOSIS — I2510 Atherosclerotic heart disease of native coronary artery without angina pectoris: Secondary | ICD-10-CM | POA: Diagnosis not present

## 2022-03-05 LAB — CBC
HCT: 36.4 % — ABNORMAL LOW (ref 39.0–52.0)
Hemoglobin: 12.1 g/dL — ABNORMAL LOW (ref 13.0–17.0)
MCH: 28.2 pg (ref 26.0–34.0)
MCHC: 33.2 g/dL (ref 30.0–36.0)
MCV: 84.8 fL (ref 80.0–100.0)
Platelets: 193 10*3/uL (ref 150–400)
RBC: 4.29 MIL/uL (ref 4.22–5.81)
RDW: 14.6 % (ref 11.5–15.5)
WBC: 7.8 10*3/uL (ref 4.0–10.5)
nRBC: 0 % (ref 0.0–0.2)

## 2022-03-05 LAB — HEPARIN LEVEL (UNFRACTIONATED)
Heparin Unfractionated: 0.32 IU/mL (ref 0.30–0.70)
Heparin Unfractionated: 0.35 IU/mL (ref 0.30–0.70)

## 2022-03-05 LAB — COMPREHENSIVE METABOLIC PANEL
ALT: 16 U/L (ref 0–44)
AST: 20 U/L (ref 15–41)
Albumin: 3.6 g/dL (ref 3.5–5.0)
Alkaline Phosphatase: 45 U/L (ref 38–126)
Anion gap: 8 (ref 5–15)
BUN: 16 mg/dL (ref 8–23)
CO2: 25 mmol/L (ref 22–32)
Calcium: 8.8 mg/dL — ABNORMAL LOW (ref 8.9–10.3)
Chloride: 105 mmol/L (ref 98–111)
Creatinine, Ser: 1.5 mg/dL — ABNORMAL HIGH (ref 0.61–1.24)
GFR, Estimated: 46 mL/min — ABNORMAL LOW (ref >60)
Glucose, Bld: 101 mg/dL — ABNORMAL HIGH (ref 70–99)
Potassium: 3.9 mmol/L (ref 3.5–5.1)
Sodium: 138 mmol/L (ref 135–145)
Total Bilirubin: 1 mg/dL (ref 0.3–1.2)
Total Protein: 6.4 g/dL — ABNORMAL LOW (ref 6.5–8.1)

## 2022-03-05 LAB — LIPID PANEL
Cholesterol: 284 mg/dL — ABNORMAL HIGH (ref 0–200)
HDL: 40 mg/dL — ABNORMAL LOW (ref >40)
LDL Cholesterol: 203 mg/dL — ABNORMAL HIGH (ref 0–99)
Total CHOL/HDL Ratio: 7.1 RATIO
Triglycerides: 207 mg/dL — ABNORMAL HIGH (ref <150)
VLDL: 41 mg/dL — ABNORMAL HIGH (ref 0–40)

## 2022-03-05 LAB — HEMOGLOBIN A1C
Hgb A1c MFr Bld: 5.9 % — ABNORMAL HIGH (ref 4.8–5.6)
Mean Plasma Glucose: 122.63 mg/dL

## 2022-03-05 LAB — TSH: TSH: 2.23 u[IU]/mL (ref 0.350–4.500)

## 2022-03-05 LAB — TROPONIN I (HIGH SENSITIVITY)
Troponin I (High Sensitivity): 22 ng/L — ABNORMAL HIGH (ref <18)
Troponin I (High Sensitivity): 19 ng/L — ABNORMAL HIGH (ref <18)

## 2022-03-05 LAB — MAGNESIUM: Magnesium: 2 mg/dL (ref 1.7–2.4)

## 2022-03-05 MED ORDER — AMLODIPINE BESYLATE 10 MG PO TABS
10.0000 mg | ORAL_TABLET | Freq: Every day | ORAL | Status: DC
  Administered 2022-03-05 – 2022-03-07 (×3): 10 mg via ORAL
  Filled 2022-03-05 (×3): qty 1

## 2022-03-05 MED ORDER — SODIUM CHLORIDE 0.9% FLUSH: 3.0000 mL | INTRAVENOUS | Status: DC | PRN

## 2022-03-05 MED ORDER — SODIUM CHLORIDE 0.9% FLUSH
3.0000 mL | Freq: Two times a day (BID) | INTRAVENOUS | Status: DC
  Administered 2022-03-05 – 2022-03-06 (×3): 3 mL via INTRAVENOUS

## 2022-03-05 MED ORDER — SODIUM CHLORIDE 0.9 % WEIGHT BASED INFUSION
3.0000 mL/kg/h | INTRAVENOUS | Status: DC
  Administered 2022-03-06: 3 mL/kg/h via INTRAVENOUS

## 2022-03-05 MED ORDER — NITROGLYCERIN IN D5W 200-5 MCG/ML-% IV SOLN
0.0000 ug/min | INTRAVENOUS | Status: DC
  Administered 2022-03-05: 10 ug/min via INTRAVENOUS
  Filled 2022-03-05: qty 250

## 2022-03-05 MED ORDER — SODIUM CHLORIDE 0.9 % IV SOLN: 250.0000 mL | INTRAVENOUS | Status: DC | PRN

## 2022-03-05 MED ORDER — ASPIRIN 81 MG PO TBEC: 81.0000 mg | DELAYED_RELEASE_TABLET | Freq: Every day | ORAL | Status: DC

## 2022-03-05 MED ORDER — HEPARIN (PORCINE) 25000 UT/250ML-% IV SOLN
800.0000 [IU]/h | INTRAVENOUS | Status: DC
  Administered 2022-03-05: 700 [IU]/h via INTRAVENOUS
  Filled 2022-03-05: qty 250

## 2022-03-05 MED ORDER — LORAZEPAM 0.5 MG PO TABS
0.5000 mg | ORAL_TABLET | Freq: Every day | ORAL | Status: DC | PRN
  Administered 2022-03-05: 0.5 mg via ORAL
  Filled 2022-03-05: qty 1

## 2022-03-05 MED ORDER — NITROGLYCERIN 0.4 MG SL SUBL: 0.4000 mg | SUBLINGUAL_TABLET | SUBLINGUAL | Status: DC | PRN

## 2022-03-05 MED ORDER — ASPIRIN 81 MG PO TBEC
81.0000 mg | DELAYED_RELEASE_TABLET | Freq: Every day | ORAL | Status: DC
  Administered 2022-03-05 – 2022-03-07 (×3): 81 mg via ORAL
  Filled 2022-03-05 (×3): qty 1

## 2022-03-05 MED ORDER — METOPROLOL TARTRATE 25 MG PO TABS
25.0000 mg | ORAL_TABLET | Freq: Two times a day (BID) | ORAL | Status: DC
  Administered 2022-03-05 – 2022-03-07 (×5): 25 mg via ORAL
  Filled 2022-03-05 (×5): qty 1

## 2022-03-05 MED ORDER — PAROXETINE HCL 20 MG PO TABS
20.0000 mg | ORAL_TABLET | Freq: Every day | ORAL | Status: DC
  Administered 2022-03-05 – 2022-03-06 (×3): 20 mg via ORAL
  Filled 2022-03-05 (×5): qty 1

## 2022-03-05 MED ORDER — ISOSORBIDE MONONITRATE ER 30 MG PO TB24
30.0000 mg | ORAL_TABLET | Freq: Every day | ORAL | Status: DC
  Administered 2022-03-05 – 2022-03-07 (×3): 30 mg via ORAL
  Filled 2022-03-05 (×3): qty 1

## 2022-03-05 MED ORDER — ASPIRIN 81 MG PO CHEW: 324.0000 mg | CHEWABLE_TABLET | ORAL | Status: DC

## 2022-03-05 MED ORDER — ASPIRIN 300 MG RE SUPP: 300.0000 mg | RECTAL | Status: DC

## 2022-03-05 MED ORDER — SODIUM CHLORIDE 0.9 % WEIGHT BASED INFUSION
1.0000 mL/kg/h | INTRAVENOUS | Status: DC
  Administered 2022-03-06: 1 mL/kg/h via INTRAVENOUS

## 2022-03-05 MED ORDER — ACETAMINOPHEN 325 MG PO TABS: 650.0000 mg | ORAL_TABLET | ORAL | Status: DC | PRN

## 2022-03-05 MED ORDER — PANTOPRAZOLE SODIUM 40 MG PO TBEC
40.0000 mg | DELAYED_RELEASE_TABLET | Freq: Every day | ORAL | Status: DC
  Administered 2022-03-05 – 2022-03-07 (×3): 40 mg via ORAL
  Filled 2022-03-05 (×3): qty 1

## 2022-03-05 MED ORDER — ONDANSETRON HCL 4 MG/2ML IJ SOLN
4.0000 mg | Freq: Four times a day (QID) | INTRAMUSCULAR | Status: DC | PRN
  Administered 2022-03-05: 4 mg via INTRAVENOUS
  Filled 2022-03-05: qty 2

## 2022-03-05 NOTE — Progress Notes (Signed)
ANTICOAGULATION CONSULT NOTE   Pharmacy Consult for Heparin  Indication: chest pain/ACS  Allergies  Allergen Reactions   Shellfish Allergy Anaphylaxis    Other reaction(s): Other (See Comments)   Zolpidem Tartrate Other (See Comments)    hallucinations   Zolpidem Tartrate     Other reaction(s): Other (See Comments) hallucinations   Brilinta [Ticagrelor]    Amoxicillin-Pot Clavulanate Other (See Comments)    REACTION: 'Burns' Stomach   Amoxicillin-Pot Clavulanate     Other reaction(s): Other (See Comments) REACTION: 'Burns' Stomach   Atorvastatin    Codeine Nausea And Vomiting   Erythromycin Diarrhea, Nausea And Vomiting and Other (See Comments)    All mycins cause upset stomach Other reaction(s): GI Upset (intolerance), Other (See Comments) All mycins cause upset stomach   Hydrochlorothiazide    Hydrocodone Nausea And Vomiting   Morphine Nausea And Vomiting   Nitrofuran Derivatives Other (See Comments)    Unknown reaction unknown   Oxycodone Hcl Nausea And Vomiting   Tramadol Nausea And Vomiting   Zocor [Simvastatin]     myalgia     Vital Signs: Temp: 98 F (36.7 C) (02/11 0444) Temp Source: Oral (02/11 0444) BP: 147/62 (02/11 0444) Pulse Rate: 63 (02/11 0444)    Medical History: Past Medical History:  Diagnosis Date   Anemia 10/06/2011   Anginal pain ([place])    Arthritis    "joints hurt; shoulders, arms, back" (08/13/2013)   Atrial fibrillation (HCC)    B12 deficiency    Carotid stenosis 04/10/2011   a. s/p left carotid endarterectomy 04/14/2011.;  b.  Carotid US (11/13):  L CEA ok; RICA 1-39%   Chronic bronchitis ([place])    "get it q yr"   Chronic chest pain    Chronic lower back pain    Colon polyp    adenomatous   Coronary artery disease    a. S/p CABG in 2001. b. S/p DES to protected LM and BMS to RCA 2006. c. 12/2009: s/p DES to Cx;  d. 09/2011 Cath: patent stents, patent grafts -->Med Rx.;  e. CP with abnl Nuc => LHC 10/04/12: oLM 30%, dLM stent into  the CFX patent, LAD occluded, LIMA-LAD patent, RI 30%, mid AVCFX 30%, oOM1 50%, oRCA stent patent, mRCA 50-60%, Radial graft-Dx patent; EF 55%.=> Med Rx.  f. stent to LM & Circ,   Daily headache    "for the last couple weeks" (08/13/2013)   Diverticulosis    Esophageal stricture    GERD (gastroesophageal reflux disease)    Hemorrhoids    Hiatal hernia    Hyperlipidemia    Hypertension    Left bundle branch block    Memory deficit    Pancreatitis 12/2009   ERCP ok.   Prostate cancer [facility])    s/p cryoablation   Renal cyst    Seen on CT 08/2011 also with circumferential bladder wall thickening   Skin cancer    "cut it off my right ear"   Statin intolerance      Assessment: 82 y/o F transfer from outside hospital with CP. Heparin is infusing upon transfer at 700 units/hr.  2/11 AM update:  Initial heparin level here is therapeutic  Goal of Therapy:  Heparin level 0.3-0.7 units/ml Monitor platelets by anticoagulation protocol: Yes   Plan:  Cont heparin 700 units/hr Heparin level at [place], PharmD, [place] Pharmacist Phone: [phone]

## 2022-03-05 NOTE — Progress Notes (Signed)
Progress Note  Patient Name: [name] [name] Date of Encounter: 03/05/2022  Primary Cardiologist:   Kirk Ruths, MD   Subjective   No chest pain. No SOB.   Inpatient Medications    Scheduled Meds:  amLODipine  10 mg Oral Daily   aspirin EC  81 mg Oral Daily   isosorbide mononitrate  30 mg Oral Daily   metoprolol tartrate  25 mg Oral BID   pantoprazole  40 mg Oral Daily   PARoxetine  20 mg Oral QHS   Continuous Infusions:  heparin 700 Units/hr (03/05/22 0412)   nitroGLYCERIN 10 mcg/min (03/05/22 0412)   PRN Meds: acetaminophen, LORazepam, nitroGLYCERIN, ondansetron (ZOFRAN) IV   Vital Signs    Vitals:   03/04/22 2341 03/05/22 0249 03/05/22 0444 03/05/22 0800  BP: 139/61  (!) 147/62 (!) 142/58  Pulse: (!) 57  63 73  Resp: 16  18 12  $ Temp: 98.2 F (36.8 C)  98 F (36.7 C) 97.7 F (36.5 C)  TempSrc: Oral  Oral Oral  SpO2: 99%  96% 98%  Weight:  62.4 kg    Height:  5' 3"$  (1.6 m)      Intake/Output Summary (Last 24 hours) at 03/05/2022 1032 Last data filed at 03/05/2022 0412 Gross per 24 hour  Intake 74.01 ml  Output --  Net 74.01 ml   Filed Weights   03/05/22 0249  Weight: 62.4 kg    Telemetry    NSR - Personally Reviewed  ECG    NA - Personally Reviewed  Physical Exam   GEN: No acute distress.   Neck: No  JVD Cardiac: RRR, no murmurs, rubs, or gallops.  Respiratory: Clear  to auscultation bilaterally. GI: Soft, nontender, non-distended  MS: No  edema; No deformity. Neuro:  Nonfocal  Psych: Normal affect   Labs    Chemistry Recent Labs  Lab 03/05/22 0107  NA 138  K 3.9  CL 105  CO2 25  GLUCOSE 101*  BUN 16  CREATININE 1.50*  CALCIUM 8.8*  PROT 6.4*  ALBUMIN 3.6  AST 20  ALT 16  ALKPHOS 45  BILITOT 1.0  GFRNONAA 46*  ANIONGAP 8     Hematology Recent Labs  Lab 03/05/22 0457  WBC 7.8  RBC 4.29  HGB 12.1*  HCT 36.4*  MCV 84.8  MCH 28.2  MCHC 33.2  RDW 14.6  PLT 193    Cardiac EnzymesNo results for  input(s): "TROPONINI" in the last 168 hours. No results for input(s): "TROPIPOC" in the last 168 hours.   BNPNo results for input(s): "BNP", "PROBNP" in the last 168 hours.   DDimer No results for input(s): "DDIMER" in the last 168 hours.   Radiology    No results found.  Cardiac Studies   NA  Patient Profile     82 y.o. male with CAD status post remote CABG and subsequent multiple DES stents, GERD, esophageal stricture, hyperlipidemia, hypertension, chronic left bundle branch block, atrial fibrillation, chronic chest pain, carotid stenosis who was seen at [facility] emergency room for chest pain and is now transferred to [facility] for further evaluation   Assessment & Plan    CAD:  Unstable angina.  Cath tomorrow.  The patient understands that risks included but are not limited to stroke (1 in 1000), death (1 in 27), kidney failure [usually temporary] (1 in 500), bleeding (1 in 200), allergic reaction [possibly serious] (1 in 200).  The patient understands and agrees to proceed.    Dyslipidemia:  Need case worker consult in AM to see if we can get him back on Repatha.  LDL is 203.    HTN:  BP mildly elevated.  Likely resume ARB after cath.  Holding with mildly increased creatinine.   CKD IIIA:  No LV gram with cath.       For questions or updates, please contact [place] Please consult [email or web address] for contact info under Cardiology/STEMI.   Signed, Minus Breeding, MD  03/05/2022, 10:32 AM

## 2022-03-05 NOTE — Progress Notes (Signed)
ANTICOAGULATION CONSULT NOTE   Pharmacy Consult for Heparin  Indication: chest pain/ACS  Allergies  Allergen Reactions   Shellfish Allergy Anaphylaxis    Other reaction(s): Other (See Comments)   Zolpidem Tartrate Other (See Comments)    hallucinations   Zolpidem Tartrate     Other reaction(s): Other (See Comments) hallucinations   Brilinta [Ticagrelor]    Amoxicillin-Pot Clavulanate Other (See Comments)    REACTION: 'Burns' Stomach   Amoxicillin-Pot Clavulanate     Other reaction(s): Other (See Comments) REACTION: 'Burns' Stomach   Atorvastatin    Codeine Nausea And Vomiting   Erythromycin Diarrhea, Nausea And Vomiting and Other (See Comments)    All mycins cause upset stomach Other reaction(s): GI Upset (intolerance), Other (See Comments) All mycins cause upset stomach   Hydrochlorothiazide    Hydrocodone Nausea And Vomiting   Morphine Nausea And Vomiting   Nitrofuran Derivatives Other (See Comments)    Unknown reaction unknown   Oxycodone Hcl Nausea And Vomiting   Tramadol Nausea And Vomiting   Zocor [Simvastatin]     myalgia     Vital Signs: Temp: 97.8 F (36.6 C) (02/11 1140) Temp Source: Oral (02/11 1140) BP: 134/57 (02/11 1140) Pulse Rate: 55 (02/11 1400)    Medical History: Past Medical History:  Diagnosis Date   Anemia 10/06/2011   Anginal pain ([place])    Arthritis    "joints hurt; shoulders, arms, back" (08/13/2013)   Atrial fibrillation ([facility])    B12 deficiency    Carotid stenosis 04/10/2011   a. s/p left carotid endarterectomy 04/14/2011.;  b.  Carotid US (11/13):  L CEA ok; RICA 1-39%   Chronic bronchitis ([place])    "get it q yr"   Chronic chest pain    Chronic lower back pain    Colon polyp    adenomatous   Coronary artery disease    a. S/p CABG in 2001. b. S/p DES to protected LM and BMS to RCA 2006. c. 12/2009: s/p DES to Cx;  d. 09/2011 Cath: patent stents, patent grafts -->Med Rx.;  e. CP with abnl Nuc => LHC 10/04/12: oLM 30%, dLM stent into  the CFX patent, LAD occluded, LIMA-LAD patent, RI 30%, mid AVCFX 30%, oOM1 50%, oRCA stent patent, mRCA 50-60%, Radial graft-Dx patent; EF 55%.=> Med Rx.  f. stent to LM & Circ,   Daily headache    "for the last couple weeks" (08/13/2013)   Diverticulosis    Esophageal stricture    GERD (gastroesophageal reflux disease)    Hemorrhoids    Hiatal hernia    Hyperlipidemia    Hypertension    Left bundle branch block    Memory deficit    Pancreatitis 12/2009   ERCP ok.   Prostate cancer [facility])    s/p cryoablation   Renal cyst    Seen on CT 08/2011 also with circumferential bladder wall thickening   Skin cancer    "cut it off my right ear"   Statin intolerance      Assessment: 82 y/o F transfer from outside hospital with CP. Heparin is infusing upon transfer at 700 units/hr.  2/11 PM update:  Initial heparin level is therapeutic  Goal of Therapy:  Heparin level 0.3-0.7 units/ml Monitor platelets by anticoagulation protocol: Yes   Plan:  Cont heparin 700 units/hr Qam Heparin Level and CBC  Alanda Slim, PharmD, [place] Clinical Pharmacist Please see AMION for all Pharmacists' Contact Phone Numbers 03/05/2022, 3:42 PM

## 2022-03-05 NOTE — Progress Notes (Signed)
ANTICOAGULATION CONSULT NOTE - Initial Consult  Pharmacy Consult for Heparin  Indication: chest pain/ACS  Allergies  Allergen Reactions   Shellfish Allergy Anaphylaxis    Other reaction(s): Other (See Comments)   Zolpidem Tartrate Other (See Comments)    hallucinations   Zolpidem Tartrate     Other reaction(s): Other (See Comments) hallucinations   Brilinta [Ticagrelor]    Amoxicillin-Pot Clavulanate Other (See Comments)    REACTION: 'Burns' Stomach   Amoxicillin-Pot Clavulanate     Other reaction(s): Other (See Comments) REACTION: 'Burns' Stomach   Atorvastatin    Codeine Nausea And Vomiting   Erythromycin Diarrhea, Nausea And Vomiting and Other (See Comments)    All mycins cause upset stomach Other reaction(s): GI Upset (intolerance), Other (See Comments) All mycins cause upset stomach   Hydrochlorothiazide    Hydrocodone Nausea And Vomiting   Morphine Nausea And Vomiting   Nitrofuran Derivatives Other (See Comments)    Unknown reaction unknown   Oxycodone Hcl Nausea And Vomiting   Tramadol Nausea And Vomiting   Zocor [Simvastatin]     myalgia     Vital Signs: Temp: 98.2 F (36.8 C) (02/10 2341) Temp Source: Oral (02/10 2341) BP: 139/61 (02/10 2341) Pulse Rate: 57 (02/10 2341)    Medical History: Past Medical History:  Diagnosis Date   Anemia 10/06/2011   Anginal pain ([place])    Arthritis    "joints hurt; shoulders, arms, back" (08/13/2013)   Atrial fibrillation (HCC)    B12 deficiency    Carotid stenosis 04/10/2011   a. s/p left carotid endarterectomy 04/14/2011.;  b.  Carotid US (11/13):  L CEA ok; RICA 1-39%   Chronic bronchitis ([place])    "get it q yr"   Chronic chest pain    Chronic lower back pain    Colon polyp    adenomatous   Coronary artery disease    a. S/p CABG in 2001. b. S/p DES to protected LM and BMS to RCA 2006. c. 12/2009: s/p DES to Cx;  d. 09/2011 Cath: patent stents, patent grafts -->Med Rx.;  e. CP with abnl Nuc => LHC 10/04/12: oLM  30%, dLM stent into the CFX patent, LAD occluded, LIMA-LAD patent, RI 30%, mid AVCFX 30%, oOM1 50%, oRCA stent patent, mRCA 50-60%, Radial graft-Dx patent; EF 55%.=> Med Rx.  f. stent to LM & Circ,   Daily headache    "for the last couple weeks" (08/13/2013)   Diverticulosis    Esophageal stricture    GERD (gastroesophageal reflux disease)    Hemorrhoids    Hiatal hernia    Hyperlipidemia    Hypertension    Left bundle branch block    Memory deficit    Pancreatitis 12/2009   ERCP ok.   Prostate cancer [facility])    s/p cryoablation   Renal cyst    Seen on CT 08/2011 also with circumferential bladder wall thickening   Skin cancer    "cut it off my right ear"   Statin intolerance      Assessment: 82 y/o F transfer from outside hospital with CP. Heparin is infusing upon transfer at 700 units/hr.  Goal of Therapy:  Heparin level 0.3-0.7 units/ml Monitor platelets by anticoagulation protocol: Yes   Plan:  Cont heparin 700 units/hr Heparin level at [place], PharmD, [place] Pharmacist Phone: [phone]

## 2022-03-06 ENCOUNTER — Encounter (HOSPITAL_COMMUNITY)
Admission: RE | Disposition: A | Discharge status: Home / Self Care | Payer: Self-pay | Source: Home / Self Care | Attending: Cardiology

## 2022-03-06 ENCOUNTER — Inpatient Hospital Stay (HOSPITAL_COMMUNITY): Payer: Medicare Other

## 2022-03-06 DIAGNOSIS — E785 Hyperlipidemia, unspecified: Secondary | ICD-10-CM | POA: Diagnosis not present

## 2022-03-06 DIAGNOSIS — I200 Unstable angina: Secondary | ICD-10-CM | POA: Diagnosis not present

## 2022-03-06 DIAGNOSIS — R079 Chest pain, unspecified: Secondary | ICD-10-CM

## 2022-03-06 DIAGNOSIS — I25110 Atherosclerotic heart disease of native coronary artery with unstable angina pectoris: Secondary | ICD-10-CM | POA: Diagnosis not present

## 2022-03-06 HISTORY — PX: CORONARY STENT INTERVENTION: CATH118234

## 2022-03-06 HISTORY — PX: LEFT HEART CATH AND CORONARY ANGIOGRAPHY: CATH118249

## 2022-03-06 LAB — BASIC METABOLIC PANEL
Anion gap: 6 (ref 5–15)
Anion gap: 6 (ref 5–15)
BUN: 20 mg/dL (ref 8–23)
BUN: 19 mg/dL (ref 8–23)
CO2: 26 mmol/L (ref 22–32)
CO2: 24 mmol/L (ref 22–32)
Calcium: 8.5 mg/dL — ABNORMAL LOW (ref 8.9–10.3)
Calcium: 8.5 mg/dL — ABNORMAL LOW (ref 8.9–10.3)
Chloride: 106 mmol/L (ref 98–111)
Chloride: 108 mmol/L (ref 98–111)
Creatinine, Ser: 1.8 mg/dL — ABNORMAL HIGH (ref 0.61–1.24)
Creatinine, Ser: 1.47 mg/dL — ABNORMAL HIGH (ref 0.61–1.24)
GFR, Estimated: 37 mL/min — ABNORMAL LOW (ref >60)
GFR, Estimated: 48 mL/min — ABNORMAL LOW (ref >60)
Glucose, Bld: 106 mg/dL — ABNORMAL HIGH (ref 70–99)
Glucose, Bld: 96 mg/dL (ref 70–99)
Potassium: 4 mmol/L (ref 3.5–5.1)
Potassium: 4.2 mmol/L (ref 3.5–5.1)
Sodium: 138 mmol/L (ref 135–145)
Sodium: 138 mmol/L (ref 135–145)

## 2022-03-06 LAB — CBC
HCT: 33.5 % — ABNORMAL LOW (ref 39.0–52.0)
HCT: 36.8 % — ABNORMAL LOW (ref 39.0–52.0)
Hemoglobin: 11 g/dL — ABNORMAL LOW (ref 13.0–17.0)
Hemoglobin: 11.9 g/dL — ABNORMAL LOW (ref 13.0–17.0)
MCH: 28.3 pg (ref 26.0–34.0)
MCH: 27.9 pg (ref 26.0–34.0)
MCHC: 32.8 g/dL (ref 30.0–36.0)
MCHC: 32.3 g/dL (ref 30.0–36.0)
MCV: 86.1 fL (ref 80.0–100.0)
MCV: 86.2 fL (ref 80.0–100.0)
Platelets: 161 10*3/uL (ref 150–400)
Platelets: 181 10*3/uL (ref 150–400)
RBC: 3.89 MIL/uL — ABNORMAL LOW (ref 4.22–5.81)
RBC: 4.27 MIL/uL (ref 4.22–5.81)
RDW: 14.7 % (ref 11.5–15.5)
RDW: 14.8 % (ref 11.5–15.5)
WBC: 5.6 10*3/uL (ref 4.0–10.5)
WBC: 6.9 10*3/uL (ref 4.0–10.5)
nRBC: 0 % (ref 0.0–0.2)
nRBC: 0 % (ref 0.0–0.2)

## 2022-03-06 LAB — ECHOCARDIOGRAM COMPLETE
Area-P 1/2: 2.92 cm2
Calc EF: 61.1 %
Height: 63 in
S' Lateral: 3.1 cm
Single Plane A2C EF: 61 %
Single Plane A4C EF: 62.3 %
Weight: 2201.07 oz

## 2022-03-06 LAB — POCT ACTIVATED CLOTTING TIME: Activated Clotting Time: 320 seconds

## 2022-03-06 LAB — CREATININE, SERUM
Creatinine, Ser: 1.38 mg/dL — ABNORMAL HIGH (ref 0.61–1.24)
GFR, Estimated: 51 mL/min — ABNORMAL LOW (ref >60)

## 2022-03-06 LAB — HEPARIN LEVEL (UNFRACTIONATED): Heparin Unfractionated: 0.28 IU/mL — ABNORMAL LOW (ref 0.30–0.70)

## 2022-03-06 SURGERY — LEFT HEART CATH AND CORONARY ANGIOGRAPHY
Anesthesia: LOCAL

## 2022-03-06 MED ORDER — HYDRALAZINE HCL 20 MG/ML IJ SOLN: 10.0000 mg | INTRAMUSCULAR | Status: AC | PRN

## 2022-03-06 MED ORDER — SODIUM CHLORIDE 0.9 % IV SOLN: 250.0000 mL | INTRAVENOUS | Status: DC | PRN

## 2022-03-06 MED ORDER — MIDAZOLAM HCL 2 MG/2ML IJ SOLN
INTRAMUSCULAR | Status: AC
  Filled 2022-03-06: qty 2

## 2022-03-06 MED ORDER — BIVALIRUDIN BOLUS VIA INFUSION - CUPID
INTRAVENOUS | Status: DC | PRN
  Administered 2022-03-06: 46.8 mg via INTRAVENOUS

## 2022-03-06 MED ORDER — LIDOCAINE HCL (PF) 1 % IJ SOLN
INTRAMUSCULAR | Status: AC
  Filled 2022-03-06: qty 30

## 2022-03-06 MED ORDER — NITROGLYCERIN 1 MG/10 ML FOR IR/CATH LAB
INTRA_ARTERIAL | Status: DC | PRN
  Administered 2022-03-06: 200 ug via INTRACORONARY

## 2022-03-06 MED ORDER — NITROGLYCERIN 1 MG/10 ML FOR IR/CATH LAB
INTRA_ARTERIAL | Status: AC
  Filled 2022-03-06: qty 10

## 2022-03-06 MED ORDER — HEPARIN SODIUM (PORCINE) 5000 UNIT/ML IJ SOLN
5000.0000 [IU] | Freq: Three times a day (TID) | INTRAMUSCULAR | Status: DC
  Administered 2022-03-07: 5000 [IU] via SUBCUTANEOUS
  Filled 2022-03-06: qty 1

## 2022-03-06 MED ORDER — LIDOCAINE HCL (PF) 1 % IJ SOLN
INTRAMUSCULAR | Status: DC | PRN
  Administered 2022-03-06: 10 mL

## 2022-03-06 MED ORDER — HEPARIN (PORCINE) IN NACL 1000-0.9 UT/500ML-% IV SOLN
INTRAVENOUS | Status: AC
  Filled 2022-03-06: qty 500

## 2022-03-06 MED ORDER — IOHEXOL 350 MG/ML SOLN
INTRAVENOUS | Status: DC | PRN
  Administered 2022-03-06: 120 mL

## 2022-03-06 MED ORDER — EZETIMIBE 10 MG PO TABS
10.0000 mg | ORAL_TABLET | Freq: Every day | ORAL | Status: DC
  Administered 2022-03-06 – 2022-03-07 (×2): 10 mg via ORAL
  Filled 2022-03-06 (×2): qty 1

## 2022-03-06 MED ORDER — VERAPAMIL HCL 2.5 MG/ML IV SOLN
INTRAVENOUS | Status: AC
  Filled 2022-03-06: qty 2

## 2022-03-06 MED ORDER — SODIUM CHLORIDE 0.9% FLUSH: 3.0000 mL | INTRAVENOUS | Status: DC | PRN

## 2022-03-06 MED ORDER — LABETALOL HCL 5 MG/ML IV SOLN: 10.0000 mg | INTRAVENOUS | Status: AC | PRN

## 2022-03-06 MED ORDER — CLOPIDOGREL BISULFATE 300 MG PO TABS
ORAL_TABLET | ORAL | Status: AC
  Filled 2022-03-06: qty 1

## 2022-03-06 MED ORDER — TIROFIBAN HCL IN NACL 5-0.9 MG/100ML-% IV SOLN
INTRAVENOUS | Status: AC
  Filled 2022-03-06: qty 100

## 2022-03-06 MED ORDER — SODIUM CHLORIDE 0.9 % IV SOLN
INTRAVENOUS | Status: DC | PRN
  Administered 2022-03-06: 1.75 mg/kg/h via INTRAVENOUS

## 2022-03-06 MED ORDER — SODIUM CHLORIDE 0.9% FLUSH
3.0000 mL | Freq: Two times a day (BID) | INTRAVENOUS | Status: DC
  Administered 2022-03-06 – 2022-03-07 (×2): 3 mL via INTRAVENOUS

## 2022-03-06 MED ORDER — FENTANYL CITRATE (PF) 100 MCG/2ML IJ SOLN
INTRAMUSCULAR | Status: DC | PRN
  Administered 2022-03-06: 25 ug via INTRAVENOUS

## 2022-03-06 MED ORDER — HEPARIN (PORCINE) IN NACL 1000-0.9 UT/500ML-% IV SOLN
INTRAVENOUS | Status: DC | PRN
  Administered 2022-03-06 (×2): 500 mL

## 2022-03-06 MED ORDER — NITROGLYCERIN IN D5W 200-5 MCG/ML-% IV SOLN: 0.0000 ug/min | INTRAVENOUS | Status: DC

## 2022-03-06 MED ORDER — HEPARIN SODIUM (PORCINE) 1000 UNIT/ML IJ SOLN
INTRAMUSCULAR | Status: AC
  Filled 2022-03-06: qty 10

## 2022-03-06 MED ORDER — FENTANYL CITRATE (PF) 100 MCG/2ML IJ SOLN
INTRAMUSCULAR | Status: AC
  Filled 2022-03-06: qty 2

## 2022-03-06 MED ORDER — MIDAZOLAM HCL 2 MG/2ML IJ SOLN
INTRAMUSCULAR | Status: DC | PRN
  Administered 2022-03-06: 1 mg via INTRAVENOUS

## 2022-03-06 MED ORDER — SODIUM CHLORIDE 0.9 % IV SOLN: INTRAVENOUS | Status: AC

## 2022-03-06 MED ORDER — CLOPIDOGREL BISULFATE 75 MG PO TABS
75.0000 mg | ORAL_TABLET | Freq: Every day | ORAL | Status: DC
  Administered 2022-03-07: 75 mg via ORAL
  Filled 2022-03-06: qty 1

## 2022-03-06 MED ORDER — CLOPIDOGREL BISULFATE 300 MG PO TABS
ORAL_TABLET | ORAL | Status: DC | PRN
  Administered 2022-03-06: 600 mg via ORAL

## 2022-03-06 SURGICAL SUPPLY — 28 items
BALL SAPPHIRE NC24 3.25X10 (BALLOONS) ×1
BALLN EMERGE MR 2.5X8 (BALLOONS) ×1
BALLN SCOREFLEX 3.0X10 (BALLOONS) ×1
BALLOON EMERGE MR 2.5X8 (BALLOONS) IMPLANT
BALLOON SAPPHIRE NC24 3.25X10 (BALLOONS) IMPLANT
BALLOON SCOREFLEX 3.0X10 (BALLOONS) IMPLANT
CATH INFINITI 5 FR IM (CATHETERS) IMPLANT
CATH INFINITI 5 FR MPA2 (CATHETERS) IMPLANT
CATH INFINITI 5FR MULTPACK ANG (CATHETERS) IMPLANT
CATH OPTICROSS HD (CATHETERS) IMPLANT
CATH VISTA GUIDE 6FR JL4 (CATHETERS) IMPLANT
ELECT DEFIB PAD ADLT CADENCE (PAD) IMPLANT
GUIDEWIRE SAF TJ AMPL .035X180 (WIRE) IMPLANT
KIT ENCORE 26 ADVANTAGE (KITS) IMPLANT
KIT HEART LEFT (KITS) ×1 IMPLANT
KIT MICROPUNCTURE NIT STIFF (SHEATH) IMPLANT
PACK CARDIAC CATHETERIZATION (CUSTOM PROCEDURE TRAY) ×1 IMPLANT
SHEATH PINNACLE 5F 10CM (SHEATH) IMPLANT
SHEATH PINNACLE 6F 10CM (SHEATH) IMPLANT
SHEATH PROBE COVER 6X72 (BAG) IMPLANT
SLED PULL BACK IVUS (MISCELLANEOUS) IMPLANT
STENT SYNERGY XD 3.0X12 (Permanent Stent) IMPLANT
SYNERGY XD 3.0X12 (Permanent Stent) ×1 IMPLANT
TRANSDUCER W/STOPCOCK (MISCELLANEOUS) ×1 IMPLANT
TUBING CIL FLEX 10 FLL-RA (TUBING) ×1 IMPLANT
WIRE EMERALD 3MM-J .035X150CM (WIRE) IMPLANT
WIRE HI TORQ VERSACORE-J 145CM (WIRE) IMPLANT
WIRE RUNTHROUGH .014X180CM (WIRE) IMPLANT

## 2022-03-06 NOTE — Progress Notes (Addendum)
Rounding Note    Patient Name: [name] [name] Date of Encounter: 03/06/2022  [place] Cardiologist: [name] Ruths, MD   Subjective   He still has intermittent chest pain, sometimes with walking sometimes at rest, some SOB, able to lie flat, no chest pain this morning.   Inpatient Medications    Scheduled Meds:  amLODipine  10 mg Oral Daily   aspirin EC  81 mg Oral Daily   isosorbide mononitrate  30 mg Oral Daily   metoprolol tartrate  25 mg Oral BID   pantoprazole  40 mg Oral Daily   PARoxetine  20 mg Oral QHS   sodium chloride flush  3 mL Intravenous Q12H   Continuous Infusions:  sodium chloride     sodium chloride 1 mL/kg/hr (03/06/22 0651)   heparin 700 Units/hr (03/05/22 0412)   nitroGLYCERIN 10 mcg/min (03/05/22 0412)   PRN Meds: sodium chloride, acetaminophen, LORazepam, nitroGLYCERIN, ondansetron (ZOFRAN) IV, sodium chloride flush   Vital Signs    Vitals:   03/06/22 0348 03/06/22 0653 03/06/22 0700 03/06/22 0737  BP: (!) 119/53     Pulse: 60  (!) 57 (!) 58  Resp: 16  17 14  $ Temp: 98 F (36.7 C)     TempSrc: Oral     SpO2: 91%  92% 92%  Weight:  62.4 kg    Height:  5' 3"$  (1.6 m)      Intake/Output Summary (Last 24 hours) at 03/06/2022 0748 Last data filed at 03/06/2022 0646 Gross per 24 hour  Intake 985.7 ml  Output 600 ml  Net 385.7 ml      03/06/2022    6:53 AM 03/05/2022    2:49 AM 06/06/2021   11:29 AM  Last 3 Weights  Weight (lbs) 137 lb 9.1 oz 137 lb 9.1 oz 139 lb  Weight (kg) 62.4 kg 62.4 kg 63.05 kg      Telemetry    Sinus rhythm with occasional PVCs - Personally Reviewed  ECG    N/A today  - Personally Reviewed  Physical Exam   GEN: No acute distress.   Neck: No JVD Cardiac: RRR, no murmurs, rubs, or gallops.  Respiratory: Clear to auscultation bilaterally. GI: Soft, non-distended  MS: No leg edema; radial pulse 2+ bilaterally  Neuro:  Nonfocal  Psych: Normal affect   Labs    High Sensitivity Troponin:    Recent Labs  Lab 03/05/22 0107 03/05/22 0322  TROPONINIHS 22* 19*     Chemistry Recent Labs  Lab 03/05/22 0107  NA 138  K 3.9  CL 105  CO2 25  GLUCOSE 101*  BUN 16  CREATININE 1.50*  CALCIUM 8.8*  MG 2.0  PROT 6.4*  ALBUMIN 3.6  AST 20  ALT 16  ALKPHOS 45  BILITOT 1.0  GFRNONAA 46*  ANIONGAP 8    Lipids  Recent Labs  Lab 03/05/22 0457  CHOL 284*  TRIG 207*  HDL 40*  LDLCALC 203*  CHOLHDL 7.1    Hematology Recent Labs  Lab 03/05/22 0457 03/06/22 0147  WBC 7.8 5.6  RBC 4.29 3.89*  HGB 12.1* 11.0*  HCT 36.4* 33.5*  MCV 84.8 86.1  MCH 28.2 28.3  MCHC 33.2 32.8  RDW 14.6 14.7  PLT 193 161   Thyroid  Recent Labs  Lab 03/05/22 0457  TSH 2.230    BNPNo results for input(s): "BNP", "PROBNP" in the last 168 hours.  DDimer No results for input(s): "DDIMER" in the last 168 hours.   Radiology  No results found.  Cardiac Studies   Echo is pending   LHC 09/11/17:  Previously placed Ost RCA to Prox RCA stent (unknown type) is widely patent. Mid RCA lesion is 60% stenosed. Ost LAD to Prox LAD lesion is 100% stenosed. Ost LM to Dist LM lesion is 95% stenosed. Ost Cx to Prox Cx lesion is 95% stenosed. A stent was successfully placed. Post intervention, there is a 0% residual stenosis. A stent was successfully placed. Post intervention, there is a 0% residual stenosis.   [name] [name] has a patent radial to the ramus branch, patent LIMA to the LAD, patent ostial stent to the RCA with moderate mid RCA stenosis and a high-grade distal left main, ostial circumflex in-stent restenosis responsible for his accelerated angina and abnormal Myoview stress test. He has successful drug-eluting stenting reducing a 95% stenosis to 0% residual. He tolerated procedure well. He did receive 140 cc of contrast will need to be hydrated. His renal function will need to be carefully followed. We will continue Angiomax a full dose for 4 hours. The sheath was then would be  removed and pressure held. The patient left the lab in stable condition.    Patient Profile     82 y.o. male with PMH of CAD s/p CABG 2001 and subsequent multiple DES stents, GERD, esophageal stricture, hyperlipidemia, hypertension, chronic left bundle branch block, chronic chest pain, carotid stenosis s/p left CEA 2013, statin intolerance, who was transferred from [facility] emergency room to [facility] for chest pain, pending LHC today.   Assessment & Plan    Chest pain CAD s/p CABG 2001 and PCIs  - last [facility] 08/2017 showed patent radial to the ramus branch, patent LIMA to the LAD, patent ostial stent to the RCA with moderate mid RCA stenosis and a high-grade distal left main, ostial circumflex in-stent restenosis; treated with successful drug-eluting stenting to ost to dist LM and ost to prox Cx.  - presented to [place] with exertional chest pain - Hs trop negative and EKG without acute changes  - Pending LHC and Echo today, maintain NPO - Continue medical therapy with IV nitro gtt and heparin gtt, ASA 50m, amlodipine 126m Imdur 3063maily, metoprolol 56m68mD (dosing reduced for bradycardia this admission),  intolerant to statin, Repartha was self discontinued 4 month ago   HLD - LDL 203, tri 207 -  intolerant to statin, Repartha was self discontinued 4 month ago due to cost - will add zetia 10mg17mly today   HTN -BP controlled on amlodipine 10mg 41mmetoprolol 56mg B51mnd Imdur 30mg, l61mtan held   CKD III - use lowest contrast load when possible, renal index near baseline , trend daily BMP  Carotid stenosis  - continue ASA, intolerant to statin, outpatient monitor 03/09/21 with R 40-59% and left 1-39%   Depression - continue PTA paroxetine   GERD - on PPI  For questions or updates, please contact [facility] [email or web address] for contact info under   Signed, Xika ZhaMargie Billet12/2024, 7:48 AM   As above, patient seen and examined.  Patient continues to  have chest pain which has been a chronic issue for him.  Troponins are 22 and 19.  He is for cardiac catheterization later today.  The risk and benefits including myocardial infarction, CVA and death discussed and he agrees to proceed.  Creatinine is 1.50.  Limit dye and follow renal function post procedure.  Continue aspirin, heparin, metoprolol and nitroglycerin.  Note he is  intolerant to statins.  We have added Zetia 10 mg daily.  He discontinued Repatha previously due to expense.  Will reassess after discharge.  [name] Ruths, MD

## 2022-03-06 NOTE — Progress Notes (Signed)
11f sheath pulled @ 6:00, manual pressure held for 20 mins Site dressed with gauze and Tegaderm Site level 0 No bleeding, no hematoma Instructions given to pt

## 2022-03-06 NOTE — Interval H&P Note (Signed)
History and Physical Interval Note:  03/06/2022 2:01 PM  [name] [name]  has presented today for surgery, with the diagnosis of unstable angina.  The various methods of treatment have been discussed with the patient and family. After consideration of risks, benefits and other options for treatment, the patient has consented to  Procedure(s): LEFT HEART CATH AND CORONARY ANGIOGRAPHY (N/A) as a surgical intervention.  The patient's history has been reviewed, patient examined, no change in status, stable for surgery.  I have reviewed the patient's chart and labs.  Questions were answered to the patient's satisfaction.    Cath Lab Visit (complete for each Cath Lab visit)  Clinical Evaluation Leading to the Procedure:   ACS: Yes.  (Unstable angina)  Non-ACS:  N/A  [name] [name]

## 2022-03-06 NOTE — Progress Notes (Signed)
ANTICOAGULATION CONSULT NOTE  Pharmacy Consult for Heparin Indication: chest pain/ACS  Allergies  Allergen Reactions   Shellfish Allergy Anaphylaxis   Zolpidem Tartrate Other (See Comments)    Hallucinations    Zolpidem Tartrate Other (See Comments)    Hallucinations   Brilinta [Ticagrelor] Other (See Comments)    Reaction not recalled   Amoxicillin-Pot Clavulanate Other (See Comments)    "Burns' Stomach"   Atorvastatin Other (See Comments)    "Made the legs hurt"   Codeine Nausea And Vomiting   Erythromycin Diarrhea, Nausea And Vomiting and Other (See Comments)    All -mycins cause upset stomach   Hydrochlorothiazide Other (See Comments)    Reaction not recalled   Hydrocodone Nausea And Vomiting   Morphine Nausea And Vomiting   Nitrofuran Derivatives Other (See Comments)    Reaction niot recalled   Oxycodone Hcl Nausea And Vomiting   Tramadol Nausea And Vomiting   Zocor [Simvastatin] Other (See Comments)    Muscle pain/soreness    Patient Measurements: Height: 5' 3"$  (160 cm) Weight: 62.4 kg (137 lb 9.1 oz) IBW/kg (Calculated) : 56.9 Heparin Dosing Weight: 62 kg  Vital Signs: Temp: 98.1 F (36.7 C) (02/12 0751) Temp Source: Oral (02/12 0751) BP: 144/59 (02/12 0743) Pulse Rate: 58 (02/12 1002)  Labs: Recent Labs    03/05/22 0107 03/05/22 0322 03/05/22 0457 03/05/22 1444 03/06/22 0147  HGB  --   --  12.1*  --  11.0*  HCT  --   --  36.4*  --  33.5*  PLT  --   --  193  --  161  HEPARINUNFRC  --   --  0.32 0.35 0.28*  CREATININE 1.50*  --   --   --   --   TROPONINIHS 22* 19*  --   --   --     Estimated Creatinine Clearance: 31.1 mL/min (A) (by C-G formula based on SCr of 1.5 mg/dL (H)).  Assessment: 82 y.o. male with medical history significant for CAD s/p CABG and subsequent multiple PCIs who presented for worsened CP. Patient not on anticoagulation PTA. Pharmacy consulted to manage heparin for ACS.   CBC wnl, platelets 161. No issues with heparin  infusion or signs of bleeding reported. Heparin level slightly low 0.28 on 700 units/hr.   Goal of Therapy:  Heparin level 0.3-0.7 units/ml Monitor platelets by anticoagulation protocol: Yes   Plan:  Increase heparin infusion to 800 units/hr Check heparin level in 8 hours and daily while on heparin Continue to monitor H&H and platelets  Thank you for allowing pharmacy to be a part of this patient's care.  Ardyth Harps, PharmD Clinical Pharmacist

## 2022-03-06 NOTE — Brief Op Note (Signed)
BRIEF CARDIAC CATHETERIZATION NOTE  03/06/2022  4:34 PM  PATIENT:  [name] [name]  82 y.o. male  PRE-OPERATIVE DIAGNOSIS:  Unstable angina  POST-OPERATIVE DIAGNOSIS:  Same  PROCEDURE:  Procedure(s): LEFT HEART CATH AND CORONARY ANGIOGRAPHY (N/A) CORONARY STENT INTERVENTION (N/A)  SURGEON:  Surgeon(s) and Role:    * Johanna Stafford, MD - Primary  FINDINGS: Multivessel CAD, including 70% ostial LMCA stenosis, CTO ostial LAD, 60% ISR proximal/mid LCx, and 60% proximal/mid RCA. Patent distal LMCA and LCx stents with 60% ISR in proximal/mid LCx just distal to takeoff of OM1. Widely patent LIMA-LAD. Patent sequential left radial-D1; jump segmetn from D1 to OM is chronically occluded. Mildly elevated LVEDP. Successful PCI to ostial/proximal LMCA using Synergy 3.0 x 12 mm drug-eluting stent (post-dilated to 3.3 mm) with 0% residual stenosis and TIMI-3 flow.  RECOMMENDATIONS: DAPT with ASA and clopidogrel for at least 12 months. Aggressive secondary prevention.  Nelva Bush, MD [facility]

## 2022-03-06 NOTE — H&P (View-Only) (Signed)
Rounding Note    Patient Name: [name] [name] Date of Encounter: 03/06/2022  [place] Cardiologist: [name] Ruths, MD   Subjective   He still has intermittent chest pain, sometimes with walking sometimes at rest, some SOB, able to lie flat, no chest pain this morning.   Inpatient Medications    Scheduled Meds:  amLODipine  10 mg Oral Daily   aspirin EC  81 mg Oral Daily   isosorbide mononitrate  30 mg Oral Daily   metoprolol tartrate  25 mg Oral BID   pantoprazole  40 mg Oral Daily   PARoxetine  20 mg Oral QHS   sodium chloride flush  3 mL Intravenous Q12H   Continuous Infusions:  sodium chloride     sodium chloride 1 mL/kg/hr (03/06/22 0651)   heparin 700 Units/hr (03/05/22 0412)   nitroGLYCERIN 10 mcg/min (03/05/22 0412)   PRN Meds: sodium chloride, acetaminophen, LORazepam, nitroGLYCERIN, ondansetron (ZOFRAN) IV, sodium chloride flush   Vital Signs    Vitals:   03/06/22 0348 03/06/22 0653 03/06/22 0700 03/06/22 0737  BP: (!) 119/53     Pulse: 60  (!) 57 (!) 58  Resp: 16  17 14  $ Temp: 98 F (36.7 C)     TempSrc: Oral     SpO2: 91%  92% 92%  Weight:  62.4 kg    Height:  5' 3"$  (1.6 m)      Intake/Output Summary (Last 24 hours) at 03/06/2022 0748 Last data filed at 03/06/2022 0646 Gross per 24 hour  Intake 985.7 ml  Output 600 ml  Net 385.7 ml      03/06/2022    6:53 AM 03/05/2022    2:49 AM 06/06/2021   11:29 AM  Last 3 Weights  Weight (lbs) 137 lb 9.1 oz 137 lb 9.1 oz 139 lb  Weight (kg) 62.4 kg 62.4 kg 63.05 kg      Telemetry    Sinus rhythm with occasional PVCs - Personally Reviewed  ECG    N/A today  - Personally Reviewed  Physical Exam   GEN: No acute distress.   Neck: No JVD Cardiac: RRR, no murmurs, rubs, or gallops.  Respiratory: Clear to auscultation bilaterally. GI: Soft, non-distended  MS: No leg edema; radial pulse 2+ bilaterally  Neuro:  Nonfocal  Psych: Normal affect   Labs    High Sensitivity Troponin:    Recent Labs  Lab 03/05/22 0107 03/05/22 0322  TROPONINIHS 22* 19*     Chemistry Recent Labs  Lab 03/05/22 0107  NA 138  K 3.9  CL 105  CO2 25  GLUCOSE 101*  BUN 16  CREATININE 1.50*  CALCIUM 8.8*  MG 2.0  PROT 6.4*  ALBUMIN 3.6  AST 20  ALT 16  ALKPHOS 45  BILITOT 1.0  GFRNONAA 46*  ANIONGAP 8    Lipids  Recent Labs  Lab 03/05/22 0457  CHOL 284*  TRIG 207*  HDL 40*  LDLCALC 203*  CHOLHDL 7.1    Hematology Recent Labs  Lab 03/05/22 0457 03/06/22 0147  WBC 7.8 5.6  RBC 4.29 3.89*  HGB 12.1* 11.0*  HCT 36.4* 33.5*  MCV 84.8 86.1  MCH 28.2 28.3  MCHC 33.2 32.8  RDW 14.6 14.7  PLT 193 161   Thyroid  Recent Labs  Lab 03/05/22 0457  TSH 2.230    BNPNo results for input(s): "BNP", "PROBNP" in the last 168 hours.  DDimer No results for input(s): "DDIMER" in the last 168 hours.   Radiology  No results found.  Cardiac Studies   Echo is pending   LHC 09/11/17:  Previously placed Ost RCA to Prox RCA stent (unknown type) is widely patent. Mid RCA lesion is 60% stenosed. Ost LAD to Prox LAD lesion is 100% stenosed. Ost LM to Dist LM lesion is 95% stenosed. Ost Cx to Prox Cx lesion is 95% stenosed. A stent was successfully placed. Post intervention, there is a 0% residual stenosis. A stent was successfully placed. Post intervention, there is a 0% residual stenosis.   [name] [name] has a patent radial to the ramus branch, patent LIMA to the LAD, patent ostial stent to the RCA with moderate mid RCA stenosis and a high-grade distal left main, ostial circumflex in-stent restenosis responsible for his accelerated angina and abnormal Myoview stress test. He has successful drug-eluting stenting reducing a 95% stenosis to 0% residual. He tolerated procedure well. He did receive 140 cc of contrast will need to be hydrated. His renal function will need to be carefully followed. We will continue Angiomax a full dose for 4 hours. The sheath was then would be  removed and pressure held. The patient left the lab in stable condition.    Patient Profile     82 y.o. male with PMH of CAD s/p CABG 2001 and subsequent multiple DES stents, GERD, esophageal stricture, hyperlipidemia, hypertension, chronic left bundle branch block, chronic chest pain, carotid stenosis s/p left CEA 2013, statin intolerance, who was transferred from [facility] emergency room to [facility] for chest pain, pending LHC today.   Assessment & Plan    Chest pain CAD s/p CABG 2001 and PCIs  - last [facility] 08/2017 showed patent radial to the ramus branch, patent LIMA to the LAD, patent ostial stent to the RCA with moderate mid RCA stenosis and a high-grade distal left main, ostial circumflex in-stent restenosis; treated with successful drug-eluting stenting to ost to dist LM and ost to prox Cx.  - presented to [place] with exertional chest pain - Hs trop negative and EKG without acute changes  - Pending LHC and Echo today, maintain NPO - Continue medical therapy with IV nitro gtt and heparin gtt, ASA 110m, amlodipine 144m Imdur 3079maily, metoprolol 67m62mD (dosing reduced for bradycardia this admission),  intolerant to statin, Repartha was self discontinued 4 month ago   HLD - LDL 203, tri 207 -  intolerant to statin, Repartha was self discontinued 4 month ago due to cost - will add zetia 10mg37mly today   HTN -BP controlled on amlodipine 10mg 34mmetoprolol 67mg B86mnd Imdur 30mg, l7mtan held   CKD III - use lowest contrast load when possible, renal index near baseline , trend daily BMP  Carotid stenosis  - continue ASA, intolerant to statin, outpatient monitor 03/09/21 with R 40-59% and left 1-39%   Depression - continue PTA paroxetine   GERD - on PPI  For questions or updates, please contact [facility] [email or web address] for contact info under   Signed, [name] ZhaMargie Billet12/2024, 7:48 AM   As above, patient seen and examined.  Patient continues to  have chest pain which has been a chronic issue for him.  Troponins are 22 and 19.  He is for cardiac catheterization later today.  The risk and benefits including myocardial infarction, CVA and death discussed and he agrees to proceed.  Creatinine is 1.50.  Limit dye and follow renal function post procedure.  Continue aspirin, heparin, metoprolol and nitroglycerin.  Note he is  intolerant to statins.  We have added Zetia 10 mg daily.  He discontinued Repatha previously due to expense.  Will reassess after discharge.  [name] Ruths, MD

## 2022-03-06 NOTE — Progress Notes (Signed)
Echocardiogram 2D Echocardiogram has been performed.  [name] [name] 03/06/2022, 8:38 AM

## 2022-03-07 ENCOUNTER — Other Ambulatory Visit (HOSPITAL_COMMUNITY): Payer: Self-pay

## 2022-03-07 ENCOUNTER — Encounter (HOSPITAL_COMMUNITY): Payer: Self-pay | Admitting: Internal Medicine

## 2022-03-07 DIAGNOSIS — I200 Unstable angina: Secondary | ICD-10-CM | POA: Diagnosis not present

## 2022-03-07 LAB — BASIC METABOLIC PANEL
Anion gap: 5 (ref 5–15)
BUN: 19 mg/dL (ref 8–23)
CO2: 26 mmol/L (ref 22–32)
Calcium: 8.4 mg/dL — ABNORMAL LOW (ref 8.9–10.3)
Chloride: 106 mmol/L (ref 98–111)
Creatinine, Ser: 1.32 mg/dL — ABNORMAL HIGH (ref 0.61–1.24)
GFR, Estimated: 54 mL/min — ABNORMAL LOW (ref >60)
Glucose, Bld: 128 mg/dL — ABNORMAL HIGH (ref 70–99)
Potassium: 3.8 mmol/L (ref 3.5–5.1)
Sodium: 137 mmol/L (ref 135–145)

## 2022-03-07 LAB — CBC
HCT: 33 % — ABNORMAL LOW (ref 39.0–52.0)
Hemoglobin: 11.2 g/dL — ABNORMAL LOW (ref 13.0–17.0)
MCH: 28.6 pg (ref 26.0–34.0)
MCHC: 33.9 g/dL (ref 30.0–36.0)
MCV: 84.4 fL (ref 80.0–100.0)
Platelets: 160 10*3/uL (ref 150–400)
RBC: 3.91 MIL/uL — ABNORMAL LOW (ref 4.22–5.81)
RDW: 14.8 % (ref 11.5–15.5)
WBC: 6 10*3/uL (ref 4.0–10.5)
nRBC: 0 % (ref 0.0–0.2)

## 2022-03-07 LAB — LIPOPROTEIN A (LPA): Lipoprotein (a): 197.7 nmol/L — ABNORMAL HIGH (ref <75.0)

## 2022-03-07 MED ORDER — EZETIMIBE 10 MG PO TABS
10.0000 mg | ORAL_TABLET | Freq: Every day | ORAL | 3 refills | Status: DC
  Filled 2022-03-07: qty 30, 30d supply, fill #0

## 2022-03-07 MED ORDER — METOPROLOL TARTRATE 25 MG PO TABS
25.0000 mg | ORAL_TABLET | Freq: Two times a day (BID) | ORAL | 3 refills | Status: DC
  Filled 2022-03-07: qty 60, 30d supply, fill #0

## 2022-03-07 MED ORDER — CLOPIDOGREL BISULFATE 75 MG PO TABS
75.0000 mg | ORAL_TABLET | Freq: Every day | ORAL | 3 refills | Status: DC
  Filled 2022-03-07: qty 30, 30d supply, fill #0

## 2022-03-07 NOTE — Care Management Important Message (Signed)
Important Message  Patient Details  Name: [name] [name] MRN: [id number] Date of Birth: May 28, 1940   Medicare Important Message Given:  Yes     Evamarie Raetz Montine Circle 03/07/2022, 10:35 AM

## 2022-03-07 NOTE — Progress Notes (Signed)
CARDIAC REHAB PHASE I   PRE:  Rate/Rhythm: 50 SR bbb  BP:  Sitting: 141/52      SaO2: 96 RA   MODE:  Ambulation: 150 ft   POST:  Rate/Rhythm: 82 SR bbb  BP:  Sitting: 162/82      SaO2: 96 RA  Pt ambulated independently tolerating well with no CP or SOB. Returned to side of bed with call bell and bedside table in reach. Post stent education including site care, restrictions, risk factors, exercise guidelines, heart healthy diet, antiplatelet therapy importance and CRP2 reviewed. All questions and concerns addressed. Will refer to [place] for CRP2. Will continue to follow.    [id number] Vanessa Barbara, RN BSN 03/07/2022 9:10 AM

## 2022-03-07 NOTE — Progress Notes (Signed)
Rounding Note    Patient Name: [name] [name] Date of Encounter: 03/07/2022  [facility] Cardiologist: Kirk Ruths, MD   Subjective   No CP or dyspnea  Inpatient Medications    Scheduled Meds:  amLODipine  10 mg Oral Daily   aspirin EC  81 mg Oral Daily   clopidogrel  75 mg Oral Q breakfast   ezetimibe  10 mg Oral Daily   heparin  5,000 Units Subcutaneous Q8H   isosorbide mononitrate  30 mg Oral Daily   metoprolol tartrate  25 mg Oral BID   pantoprazole  40 mg Oral Daily   PARoxetine  20 mg Oral QHS   sodium chloride flush  3 mL Intravenous Q12H   sodium chloride flush  3 mL Intravenous Q12H   Continuous Infusions:  sodium chloride     nitroGLYCERIN Stopped (03/07/22 0515)   PRN Meds: sodium chloride, acetaminophen, LORazepam, nitroGLYCERIN, ondansetron (ZOFRAN) IV, sodium chloride flush   Vital Signs    Vitals:   03/07/22 0715 03/07/22 0743 03/07/22 0746 03/07/22 0800  BP:   (!) 140/55 (!) 141/52  Pulse: (!) 0  64 62  Resp:   16 16  Temp:  98.1 F (36.7 C)    TempSrc:  Oral    SpO2:   95% 95%  Weight:      Height:        Intake/Output Summary (Last 24 hours) at 03/07/2022 1003 Last data filed at 03/07/2022 0745 Gross per 24 hour  Intake 240 ml  Output 1250 ml  Net -1010 ml      03/06/2022    6:53 AM 03/05/2022    2:49 AM 06/06/2021   11:29 AM  Last 3 Weights  Weight (lbs) 137 lb 9.1 oz 137 lb 9.1 oz 139 lb  Weight (kg) 62.4 kg 62.4 kg 63.05 kg      Telemetry    Sinus - Personally Reviewed   Physical Exam   GEN: No acute distress.   Neck: No JVD Cardiac: RRR, no murmurs, rubs, or gallops.  Respiratory: Clear to auscultation bilaterally. GI: Soft, nontender, non-distended  MS: No edema; radial cath site with no hematoma Neuro:  Nonfocal  Psych: Normal affect   Labs    High Sensitivity Troponin:   Recent Labs  Lab 03/05/22 0107 03/05/22 0322  TROPONINIHS 22* 19*     Chemistry Recent Labs  Lab 03/05/22 0107  03/06/22 0147 03/06/22 1211 03/06/22 2005 03/07/22 0012  NA 138 138 138  --  137  K 3.9 4.0 4.2  --  3.8  CL 105 106 108  --  106  CO2 25 26 24  $ --  26  GLUCOSE 101* 106* 96  --  128*  BUN 16 20 19  $ --  19  CREATININE 1.50* 1.80* 1.47* 1.38* 1.32*  CALCIUM 8.8* 8.5* 8.5*  --  8.4*  MG 2.0  --   --   --   --   PROT 6.4*  --   --   --   --   ALBUMIN 3.6  --   --   --   --   AST 20  --   --   --   --   ALT 16  --   --   --   --   ALKPHOS 45  --   --   --   --   BILITOT 1.0  --   --   --   --   [facility]  46* 37* 48* 51* 54*  ANIONGAP 8 6 6  $ --  5    Lipids  Recent Labs  Lab 03/05/22 0457  CHOL 284*  TRIG 207*  HDL 40*  LDLCALC 203*  CHOLHDL 7.1    Hematology Recent Labs  Lab 03/06/22 0147 03/06/22 2005 03/07/22 0012  WBC 5.6 6.9 6.0  RBC 3.89* 4.27 3.91*  HGB 11.0* 11.9* 11.2*  HCT 33.5* 36.8* 33.0*  MCV 86.1 86.2 84.4  MCH 28.3 27.9 28.6  MCHC 32.8 32.3 33.9  RDW 14.7 14.8 14.8  PLT 161 181 160   Thyroid  Recent Labs  Lab 03/05/22 0457  TSH 2.230     Radiology    CARDIAC CATHETERIZATION  Result Date: 03/06/2022 Conclusions: Multivessel CAD, including 70% ostial LMCA stenosis, CTO ostial LAD, 60% ISR proximal/mid LCx, and 60% proximal/mid RCA. Patent distal LMCA and LCx stents with 60% ISR in proximal/mid LCx just distal to takeoff of OM1. Widely patent LIMA-LAD graft. Patent sequential left radial-D1 graft; jump segment from D1 to OM is chronically occluded. Mildly elevated LVEDP. Successful PCI to ostial/proximal LMCA using Synergy 3.0 x 12 mm drug-eluting stent (post-dilated to 3.3 mm) with 0% residual stenosis and TIMI-3 flow.  Recommendations: Dual antiplatelet therapy with aspirin and clopidogrel for at least 12 months, ideally longer Aggressive secondary prevention. Remove right femoral artery sheath two hours after discontinuation of bivalirudin. Nelva Bush, MD [facility]  ECHOCARDIOGRAM COMPLETE  Result Date: 03/06/2022    ECHOCARDIOGRAM  REPORT   Patient Name:   [name] [name] [place] Date of Exam: 03/06/2022 Medical Rec #:  [id number]      Height:       63.0 in Accession #:    [id number]     Weight:       137.6 lb Date of Birth:  02/24/1940      BSA:          1.649 m Patient Age:    82 years       BP:           144/59 mmHg Patient Gender: M              HR:           61 bpm. Exam Location:  Inpatient Procedure: 2D Echo, Color Doppler and Cardiac Doppler Indications:    R07.9* Chest pain, unspecified  History:        Patient has prior history of Echocardiogram examinations. CAD,                 Arrythmias:Atrial Fibrillation and LBBB; Risk                 Factors:Hypertension and Dyslipidemia.  Sonographer:    Phineas Douglas Referring Phys: [place]  1. Left ventricular ejection fraction, by estimation, is 60 to 65%. The left ventricle has normal function. The left ventricle has no regional wall motion abnormalities. There is moderate concentric left ventricular hypertrophy. Left ventricular diastolic parameters are indeterminate.  2. Right ventricular systolic function is normal. The right ventricular size is normal. Tricuspid regurgitation signal is inadequate for assessing PA pressure.  3. Left atrial size was mildly dilated.  4. The mitral valve is grossly normal. Trivial mitral valve regurgitation. No evidence of mitral stenosis.  5. The aortic valve is tricuspid. There is mild calcification of the aortic valve. There is mild thickening of the aortic valve. Aortic valve regurgitation is trivial. No aortic stenosis is present.  6. The inferior  vena cava is normal in size with greater than 50% respiratory variability, suggesting right atrial pressure of 3 mmHg. Comparison(s): No significant change from prior study. FINDINGS  Left Ventricle: Left ventricular ejection fraction, by estimation, is 60 to 65%. The left ventricle has normal function. The left ventricle has no regional wall motion abnormalities. The left ventricular internal  cavity size was normal in size. There is  moderate concentric left ventricular hypertrophy. Abnormal (paradoxical) septal motion consistent with post-operative status. Left ventricular diastolic parameters are indeterminate. Right Ventricle: The right ventricular size is normal. Right vetricular wall thickness was not well visualized. Right ventricular systolic function is normal. Tricuspid regurgitation signal is inadequate for assessing PA pressure. Left Atrium: Left atrial size was mildly dilated. Right Atrium: Right atrial size was normal in size. Pericardium: There is no evidence of pericardial effusion. Presence of epicardial fat layer. Mitral Valve: The mitral valve is grossly normal. Trivial mitral valve regurgitation. No evidence of mitral valve stenosis. Tricuspid Valve: The tricuspid valve is grossly normal. Tricuspid valve regurgitation is trivial. No evidence of tricuspid stenosis. Aortic Valve: The aortic valve is tricuspid. There is mild calcification of the aortic valve. There is mild thickening of the aortic valve. Aortic valve regurgitation is trivial. No aortic stenosis is present. Pulmonic Valve: The pulmonic valve was not well visualized. Pulmonic valve regurgitation is not visualized. Aorta: The aortic root, ascending aorta and aortic arch are all structurally normal, with no evidence of dilitation or obstruction. Venous: The inferior vena cava is normal in size with greater than 50% respiratory variability, suggesting right atrial pressure of 3 mmHg. IAS/Shunts: The atrial septum is grossly normal.  LEFT VENTRICLE PLAX 2D LVIDd:         4.30 cm     Diastology LVIDs:         3.10 cm     LV e' medial:    7.83 cm/s LV PW:         1.30 cm     LV E/e' medial:  9.8 LV IVS:        1.50 cm     LV e' lateral:   9.79 cm/s LVOT diam:     2.10 cm     LV E/e' lateral: 7.8 LV SV:         67 LV SV Index:   41 LVOT Area:     3.46 cm  LV Volumes (MOD) LV vol d, MOD A2C: 94.5 ml LV vol d, MOD A4C: 74.0 ml LV  vol s, MOD A2C: 36.9 ml LV vol s, MOD A4C: 27.9 ml LV SV MOD A2C:     57.6 ml LV SV MOD A4C:     74.0 ml LV SV MOD BP:      52.6 ml RIGHT VENTRICLE             IVC RV Basal diam:  4.00 cm     IVC diam: 1.50 cm RV S prime:     12.20 cm/s TAPSE (M-mode): 2.1 cm LEFT ATRIUM             Index        RIGHT ATRIUM           Index LA diam:        3.70 cm 2.24 cm/m   RA Area:     14.30 cm LA Vol (A2C):   56.6 ml 34.32 ml/m  RA Volume:   29.90 ml  18.13 ml/m LA Vol (A4C):   50.1 ml  30.38 ml/m LA Biplane Vol: 53.3 ml 32.32 ml/m  AORTIC VALVE LVOT Vmax:   81.30 cm/s LVOT Vmean:  55.700 cm/s LVOT VTI:    0.193 m  AORTA Ao Root diam: 3.60 cm Ao Asc diam:  2.80 cm MITRAL VALVE MV Area (PHT): 2.92 cm    SHUNTS MV Decel Time: 260 msec    Systemic VTI:  0.19 m MV E velocity: 76.60 cm/s  Systemic Diam: 2.10 cm MV A velocity: 74.70 cm/s MV E/A ratio:  1.03 Buford Dresser MD Electronically signed by Buford Dresser MD Signature Date/Time: 03/06/2022/10:11:30 AM    Final     Patient Profile     82 y.o. male with past medical history of coronary artery disease status post coronary bypass and graft, multiple subsequent PCI's, hypertension, hyperlipidemia, chronic chest pain, carotid artery disease with prior carotid endarterectomy, statin intolerance admitted with chest pain.  Echocardiogram showed normal LV function, mild left atrial enlargement, trace aortic insufficiency.  Cardiac catheterization revealed 70% left main, occluded LAD, 60% circumflex, 60% right coronary artery, patent LIMA to the LAD, sequential radial artery to first diagonal and ramus with occlusion between insertion which is chronic.  Patient had PCI of the left main.  Assessment & Plan    1 coronary artery disease-patient is now status post PCI of the left main.  Continue aspirin, Plavix, isosorbide and metoprolol.  He is intolerant to statins.  2 hyperlipidemia-intolerant to statins.  He discontinued Repatha previously due to expense.   Can review patient assistance as an outpatient.  Zetia has been initiated.  Check lipids and liver in 8 weeks.  3 hypertension-patient's blood pressure is controlled.  Continue present medical regimen.  4 chronic chest pain-no further chest pain this morning.  5 chronic stage IIIa kidney disease-creatinine unchanged this morning.  Plan discharge today.  Continue present medications.  Follow-up in [place] in 2 weeks (patient lives in [place]). Greater than 30 minutes PA and physician time. D2  For questions or updates, please contact [place] Please consult [email or web address] for contact info under        Signed, Kirk Ruths, MD  03/07/2022, 10:03 AM

## 2022-03-07 NOTE — Discharge Summary (Signed)
Discharge Summary    Patient ID: [name] [name] MRN: [id number]; DOB: 29-Jun-1940  Admit date: 03/04/2022 Discharge date: 03/07/2022  PCP:  Cyndi Bender, [place] Providers Cardiologist:  Kirk Ruths, MD   {   Discharge Diagnoses    Principal Problem:   Unstable angina [facility]) Active Problems:   Primary hypertension   Coronary artery disease due to lipid rich plaque   Pure hypercholesterolemia    Diagnostic Studies/Procedures    LHC on 03/06/22:  Diagnostic Dominance: Right  Intervention   Conclusions: Multivessel CAD, including 70% ostial LMCA stenosis, CTO ostial LAD, 60% ISR proximal/mid LCx, and 60% proximal/mid RCA. Patent distal LMCA and LCx stents with 60% ISR in proximal/mid LCx just distal to takeoff of OM1. Widely patent LIMA-LAD graft. Patent sequential left radial-D1 graft; jump segment from D1 to OM is chronically occluded. Mildly elevated LVEDP. Successful PCI to ostial/proximal LMCA using Synergy 3.0 x 12 mm drug-eluting stent (post-dilated to 3.3 mm) with 0% residual stenosis and TIMI-3 flow.   Recommendations: Dual antiplatelet therapy with aspirin and clopidogrel for at least 12 months, ideally longer Aggressive secondary prevention. Remove right femoral artery sheath two hours after discontinuation of bivalirudin.     Fluoro time: 25.2 (min) DAP: 46.8 (Gycm2) Cumulative Air Kerma: 706.6 (mGy)    Echo on 03/06/22:  1. Left ventricular ejection fraction, by estimation, is 60 to 65%. The  left ventricle has normal function. The left ventricle has no regional  wall motion abnormalities. There is moderate concentric left ventricular  hypertrophy. Left ventricular  diastolic parameters are indeterminate.   2. Right ventricular systolic function is normal. The right ventricular  size is normal. Tricuspid regurgitation signal is inadequate for assessing  PA pressure.   3. Left atrial size was mildly dilated.   4. The  mitral valve is grossly normal. Trivial mitral valve  regurgitation. No evidence of mitral stenosis.   5. The aortic valve is tricuspid. There is mild calcification of the  aortic valve. There is mild thickening of the aortic valve. Aortic valve  regurgitation is trivial. No aortic stenosis is present.   6. The inferior vena cava is normal in size with greater than 50%  respiratory variability, suggesting right atrial pressure of 3 mmHg.   Comparison(s): No significant change from prior study.   History of Present Illness     Per admission H&P on 03/04/2022:  [name] is an 82 year old male with an extensive history of CAD.  He underwent CABG in 2001 with LIMA to LAD and radial graft to ramus.  He has had multiple PCI since his CABG.  He underwent protected left main DES and bare-metal stent to RCA in 2006.  Status post DES to left circumflex 12/2009 and cath 10/13/2011 showed patent stents and patent grafts and medical therapy was recommended at that time for chest pain.  He had an abnormal nuclear stress test for chest pain in 2014 showing an ostial left main 30%, distal left main stent into the left circumflex patent, occluded LAD with patent LIMA to the LAD, 30% ramus intermedius, 30% mid AV left circumflex, 50% OM1, patent stent in the ostial RCA, 50 to 60% mid RCA and radial graft to ramus patent and medical therapy recommended.    Cath 8/19 showed patent ostial RCA stent, 60% mRCA, 100% pLAD, 95% dLM in-stent restenosis, 95% oLM in-stent restenosis and patent radial graft to the ramus and patent LIMA to the LAD.  At that time he  underwent PCI of his left main and circumflex.  2D echo at that time showed normal LV function and grade 2 diastolic dysfunction.  He underwent PCI of the left main and left circumflex in-stent restenosis.     He also has a history of chronic chest pain, atrial fibrillation, carotid artery stenosis status post left CEA in 2013 (carotid Dopplers 03/09/2021 showed 40 to  59% right ICA stenosis and 1 to 39% left ICA stenosis followed by vascular surgery), GERD, esophageal stricture, hiatal hernia, hyperlipidemia, hypertension, left bundle branch block and he is statin intolerant.   He was last seen in the office By Coletta Memos, NP on 04/04/2021 and was doing well.  He had been on Plavix previously but that was stopped.  At that time he was on aspirin Imdur Repatha metoprolol and as needed nitroglycerin as well as amlodipine.  He has been on Repatha as well.   He was in his usual state of health until earlier in the morning on 2/10 when he was eating breakfast and developed chest pain with radiation into his neck, ears and head associated with nausea, SOB and diaphoresis.  This pain was reminiscent of pain he had during his stress test 2014 but also before his prior interventions.  His pain was a 9/10 at its worst and an hour later eased off.     In ER BP was 124/93mHg, HR 56, O2 sats 98% on RA, WBC 6, Hbg 12.7, Pl ct 187, Na 139, SCr 1.2, BS 114, K+ 4.4, AT 26, ALT 18, pro BNP nl at 676;, INR 1. Cxray NAD,  He was evaluated By Dr. [facility] if develops recurrent CP or + trop.  During initial encounter, he denied any CP, SOB, PND, orthopnea, LE edema, dizziness, palpitations or syncope.  He tells me that recently he has been having chest discomfort when he goes to the mailbox and has to slow down.  He presented to the ER because this episode of CP occurred while resting as it had only been occurring with exertion for a few weeks before. He has chronic CP that usually is around a 2/10 that is reproducible with palpation of chest wall but his recent CP was much worse and different with exertion.   Hospital Course     Consultants: n/a   Chest pain CAD s/p CABG 2001 and PCIs   [facility] - Hs trop negative and EKG without acute changes   -Echocardiogram from 03/06/2022 revealed LVEF 60 to 65%, no regional wall motion abnormality, moderate concentric LVH, indeterminate diastolic parameter, normal RV, mild LAE, trivial MR, trivial AI. -Left heart catheterization 03/06/2022 revealed multivessel CAD including 70% ostial LMCA stenosis, CTO ostial LAD, 60% ISR proximal/mid LCx, and 60% proximal/mid RCA. Patent distal LMCA and LCx stents with 60% ISR in proximal/mid LCx just distal to takeoff of OM1. Widely patent LIMA-LAD graft. Patent sequential left radial-D1 graft; jump segment from D1 to OM is chronically occluded. Mildly elevated LVEDP 19 Plavix 75 mg daily, mmHg.  Successful PCI to ostial/proximal LMCA with DES. - Recommend medical therapy with  ASA 826m Plavix 7553maily for 12 months,  continue PTA amlodipine 98m40mmdur 30mg63mly, metoprolol 25mg 1m(dosing reduced for bradycardia this admission),  intolerant to statin, Repartha was self discontinued 4 month ago due to cost, zetia 98mg a51m this admission, PRN nitro - Post cath care discussed in  detail - Follow up arranged on 03/20/22 with cardiology    HLD - LDL 203, tri 207 -  intolerant to statin, Repartha was self discontinued 4 month ago due to cost, may involve outpatient pharmacy for assistance  - added zetia 15m daily  - repeat lipid panel in 2 months    HTN -BP controlled on amlodipine 133mand metoprolol 2539mID and Imdur 37m97most cath renal index stable, PTA losartan 50mg53mly maybe resumed now    CKD IIIb - renal index near baseline , Cr 1.32 and GFR 54 today    Carotid stenosis  - continue ASA, intolerant to statin, outpatient monitor 03/09/21 with R 40-59% and left 1-39%    Depression - continue PTA paroxetine    GERD -on PTA protonix         Did the patient have an acute coronary syndrome (MI, NSTEMI, STEMI, etc) this admission?:  No                               Did the patient have a percutaneous coronary intervention (stent / angioplasty)?:   Yes.     Cath/PCI Registry Performance & Quality Measures: Aspirin prescribed? - Yes ADP Receptor Inhibitor (Plavix/Clopidogrel, Brilinta/Ticagrelor or Effient/Prasugrel) prescribed (includes medically managed patients)? - Yes High Intensity Statin (Lipitor 40-80mg 53mrestor 20-40mg) 1mcribed? - No - statin intolerance  For EF <40%, was ACEI/ARB prescribed? - Yes For EF <40%, Aldosterone Antagonist (Spironolactone or Eplerenone) prescribed? - Not Applicable (EF >/= 40%) [id number] Rehab Phase II ordered? - Yes       The patient will be scheduled for a TOC follow up appointment in 13 days.  A message has been sent to the T[facility] at the office where the patient should be seen for follow up.  _____________  Discharge Vitals Blood pressure (!) 141/52, pulse 62, temperature 98.1 F (36.7 C), temperature source Oral, resp. rate 16, height 5' 3"$  (1.6 m), weight 62.4 kg, SpO2 95 %.  Filed Weights   03/05/22 0249 03/06/22 0653  Weight: 62.4 kg 62.4 kg   PE:  Alert and oriented x3, no cognitive deficit Ambulating independently  Right groin site with dressing, mild tenderness on palpation, no hematoma or bruise or bleeding, RLE warm to touch, distal pulse 2+, no neurovascular deficit Further physical exam see MD rounding notes today    Labs & Radiologic Studies    CBC Recent Labs    03/06/22 2005 03/07/22 0012  WBC 6.9 6.0  HGB 11.9* 11.2*  HCT 36.8* 33.0*  MCV 86.2 84.4  PLT 181 160   [id number] Metabolic Panel Recent Labs    03/05/22 0107 03/06/22 0147 03/06/22 1211 03/06/22 2005 03/07/22 0012  NA 138   < > 138  --  137  K 3.9   < > 4.2  --  3.8  CL 105   < > 108  --  106  CO2 25   < > 24  --  26  GLUCOSE 101*   < > 96  --  128*  BUN 16   < > 19  --  19  CREATININE 1.50*   < > 1.47* 1.38* 1.32*  CALCIUM 8.8*   < > 8.5*  --  8.4*  MG 2.0  --   --   --   --    < > = values in this interval not displayed.   Liver Function Tests  Recent Labs     03/05/22 0107  AST 20  ALT 16  ALKPHOS 45  BILITOT 1.0  PROT 6.4*  ALBUMIN 3.6   No results for input(s): "LIPASE", "AMYLASE" in the last 72 hours. High Sensitivity Troponin:   Recent Labs  Lab 03/05/22 0107 03/05/22 0322  TROPONINIHS 22* 19*    BNP Invalid input(s): "POCBNP" D-Dimer No results for input(s): "DDIMER" in the last 72 hours. Hemoglobin A1C Recent Labs    03/05/22 0457  HGBA1C 5.9*   Fasting Lipid Panel Recent Labs    03/05/22 0457  CHOL 284*  HDL 40*  LDLCALC 203*  TRIG 207*  CHOLHDL 7.1   Thyroid Function Tests Recent Labs    03/05/22 0457  TSH 2.230   _____________  CARDIAC CATHETERIZATION  Result Date: 03/06/2022 Conclusions: Multivessel CAD, including 70% ostial LMCA stenosis, CTO ostial LAD, 60% ISR proximal/mid LCx, and 60% proximal/mid RCA. Patent distal LMCA and LCx stents with 60% ISR in proximal/mid LCx just distal to takeoff of OM1. Widely patent LIMA-LAD graft. Patent sequential left radial-D1 graft; jump segment from D1 to OM is chronically occluded. Mildly elevated LVEDP. Successful PCI to ostial/proximal LMCA using Synergy 3.0 x 12 mm drug-eluting stent (post-dilated to 3.3 mm) with 0% residual stenosis and TIMI-3 flow.  Recommendations: Dual antiplatelet therapy with aspirin and clopidogrel for at least 12 months, ideally longer Aggressive secondary prevention. Remove right femoral artery sheath two hours after discontinuation of bivalirudin. Nelva Bush, MD [facility]  ECHOCARDIOGRAM COMPLETE  Result Date: 03/06/2022    ECHOCARDIOGRAM REPORT   Patient Name:   CONFESOR CAPEL Pursley Date of Exam: 03/06/2022 Medical Rec #:  [id number]      Height:       63.0 in Accession #:    [id number]     Weight:       137.6 lb Date of Birth:  23-Feb-1940      BSA:          1.649 m Patient Age:    33 years       BP:           144/59 mmHg Patient Gender: M              HR:           61 bpm. Exam Location:  Inpatient Procedure: 2D Echo, Color Doppler and  Cardiac Doppler Indications:    R07.9* Chest pain, unspecified  History:        Patient has prior history of Echocardiogram examinations. CAD,                 Arrythmias:Atrial Fibrillation and LBBB; Risk                 Factors:Hypertension and Dyslipidemia.  Sonographer:    Phineas Douglas Referring Phys: [place]  1. Left ventricular ejection fraction, by estimation, is 60 to 65%. The left ventricle has normal function. The left ventricle has no regional wall motion abnormalities. There is moderate concentric left ventricular hypertrophy. Left ventricular diastolic parameters are indeterminate.  2. Right ventricular systolic function is normal. The right ventricular size is normal. Tricuspid regurgitation signal is inadequate for assessing PA pressure.  3. Left atrial size was mildly dilated.  4. The mitral valve is grossly normal. Trivial mitral valve regurgitation. No evidence of mitral stenosis.  5. The aortic valve is tricuspid. There is mild calcification of the aortic valve. There is mild thickening of the aortic valve. Aortic valve  regurgitation is trivial. No aortic stenosis is present.  6. The inferior vena cava is normal in size with greater than 50% respiratory variability, suggesting right atrial pressure of 3 mmHg. Comparison(s): No significant change from prior study. FINDINGS  Left Ventricle: Left ventricular ejection fraction, by estimation, is 60 to 65%. The left ventricle has normal function. The left ventricle has no regional wall motion abnormalities. The left ventricular internal cavity size was normal in size. There is  moderate concentric left ventricular hypertrophy. Abnormal (paradoxical) septal motion consistent with post-operative status. Left ventricular diastolic parameters are indeterminate. Right Ventricle: The right ventricular size is normal. Right vetricular wall thickness was not well visualized. Right ventricular systolic function is normal. Tricuspid  regurgitation signal is inadequate for assessing PA pressure. Left Atrium: Left atrial size was mildly dilated. Right Atrium: Right atrial size was normal in size. Pericardium: There is no evidence of pericardial effusion. Presence of epicardial fat layer. Mitral Valve: The mitral valve is grossly normal. Trivial mitral valve regurgitation. No evidence of mitral valve stenosis. Tricuspid Valve: The tricuspid valve is grossly normal. Tricuspid valve regurgitation is trivial. No evidence of tricuspid stenosis. Aortic Valve: The aortic valve is tricuspid. There is mild calcification of the aortic valve. There is mild thickening of the aortic valve. Aortic valve regurgitation is trivial. No aortic stenosis is present. Pulmonic Valve: The pulmonic valve was not well visualized. Pulmonic valve regurgitation is not visualized. Aorta: The aortic root, ascending aorta and aortic arch are all structurally normal, with no evidence of dilitation or obstruction. Venous: The inferior vena cava is normal in size with greater than 50% respiratory variability, suggesting right atrial pressure of 3 mmHg. IAS/Shunts: The atrial septum is grossly normal.  LEFT VENTRICLE PLAX 2D LVIDd:         4.30 cm     Diastology LVIDs:         3.10 cm     LV e' medial:    7.83 cm/s LV PW:         1.30 cm     LV E/e' medial:  9.8 LV IVS:        1.50 cm     LV e' lateral:   9.79 cm/s LVOT diam:     2.10 cm     LV E/e' lateral: 7.8 LV SV:         67 LV SV Index:   41 LVOT Area:     3.46 cm  LV Volumes (MOD) LV vol d, MOD A2C: 94.5 ml LV vol d, MOD A4C: 74.0 ml LV vol s, MOD A2C: 36.9 ml LV vol s, MOD A4C: 27.9 ml LV SV MOD A2C:     57.6 ml LV SV MOD A4C:     74.0 ml LV SV MOD BP:      52.6 ml RIGHT VENTRICLE             IVC RV Basal diam:  4.00 cm     IVC diam: 1.50 cm RV S prime:     12.20 cm/s TAPSE (M-mode): 2.1 cm LEFT ATRIUM             Index        RIGHT ATRIUM           Index LA diam:        3.70 cm 2.24 cm/m   RA Area:     14.30 cm LA Vol  (A2C):   56.6 ml 34.32 ml/m  RA Volume:  29.90 ml  18.13 ml/m LA Vol (A4C):   50.1 ml 30.38 ml/m LA Biplane Vol: 53.3 ml 32.32 ml/m  AORTIC VALVE LVOT Vmax:   81.30 cm/s LVOT Vmean:  55.700 cm/s LVOT VTI:    0.193 m  AORTA Ao Root diam: 3.60 cm Ao Asc diam:  2.80 cm MITRAL VALVE MV Area (PHT): 2.92 cm    SHUNTS MV Decel Time: 260 msec    Systemic VTI:  0.19 m MV E velocity: 76.60 cm/s  Systemic Diam: 2.10 cm MV A velocity: 74.70 cm/s MV E/A ratio:  1.03 Buford Dresser MD Electronically signed by Buford Dresser MD Signature Date/Time: 03/06/2022/10:11:30 AM    Final    Disposition   Patient is seen by Dr [name] and deemed stable for discharge. Discharge plan, medication change, follow up appointment, post cath care discussed in detail at bedside. Follow up arranged 03/20/22. Pt is being discharged home today in good condition.  Follow-up Plans & Appointments     Follow-up Information     Deberah Pelton, NP Follow up on 03/20/2022.   Specialty: Cardiology Why: at 2:20pm for cardiology follow up Contact information: [address] [place] [address] [phone]                Discharge Instructions     Amb Referral to Cardiac Rehabilitation   Complete by: As directed    Diagnosis: Coronary Stents   After initial evaluation and assessments completed: Virtual Based Care may be provided alone or in conjunction with Phase 2 Cardiac Rehab based on patient barriers.: Yes   Intensive Cardiac Rehabilitation (ICR) [place] location only OR Traditional Cardiac Rehabilitation (TCR) *If criteria for ICR are not met will enroll in TCR [facility] only): Yes   Diet - low sodium heart healthy   Complete by: As directed    Discharge instructions   Complete by: As directed    [place].  PLEASE ATTEND ALL SCHEDULED FOLLOW-UP APPOINTMENTS.   Activity: Increase activity slowly as tolerated. You  may shower, but no soaking baths (or swimming) for 1 week. No driving for 24 hours. No lifting over 5 lbs for 1 week. No sexual activity for 1 week.   You May Return to Work: in 2 weeks (if applicable)  Wound Care: You may wash cath site gently with soap and water. Keep cath site clean and dry. If you notice pain, swelling, bleeding or pus at your cath site, please call [phone].   Groin Site Care  Refer to this sheet in the next few weeks. These instructions provide you with information on caring for yourself after your procedure. Your caregiver may also give you more specific instructions. Your treatment has been planned according to current medical practices, but problems sometimes occur. Call your caregiver if you have any problems or questions after your procedure.  HOME CARE INSTRUCTIONS You may shower 24 hours after the procedure. Remove the bandage (dressing) and gently wash the site with plain soap and water. Gently pat the site dry.  Do not apply powder or lotion to the site.  Do not sit in a bathtub, swimming pool, or whirlpool for 5 to 7 days.  No bending, squatting, or lifting anything over 10 pounds (4.5 kg) as directed by your caregiver.  Inspect the site at least twice daily.  Do not drive home if you are discharged the same day of the procedure. Have someone else  drive you.  You may drive 24 hours after the procedure unless otherwise instructed by your caregiver.   What to expect: Any bruising will usually fade within 1 to 2 weeks.  Blood that collects in the tissue (hematoma) may be painful to the touch. It should usually decrease in size and tenderness within 1 to 2 weeks.   SEEK IMMEDIATE MEDICAL CARE IF: You have unusual pain at the groin site or down the affected leg.  You have redness, warmth, swelling, or pain at the groin site.  You have drainage (other than a small amount of blood on the dressing).  You have chills.  You have a fever or persistent symptoms  for more than 72 hours.  You have a fever and your symptoms suddenly get worse.  Your leg becomes pale, cool, tingly, or numb.  You have heavy bleeding from the site. Hold pressure on the site. Marland Kitchen   PLEASE DO NOT MISS ANY DOSES OF YOUR ASPIRIN/PLAVIX !!!!!   Also keep a log of you blood pressures and bring back to your follow up appt. Please call the office with any questions.   Patients taking blood thinners should generally stay away from medicines like ibuprofen, Advil, Motrin, naproxen, and Aleve due to risk of stomach bleeding. You may take Tylenol as directed or talk to your primary doctor about alternatives.    PLEASE ENSURE THAT YOU DO NOT RUN OUT OF YOUR ASPIRIN/BRILINTA/PLAVIX. This medication is very important to remain on for at least one year. IF you have issues obtaining this medication due to cost please CALL the office 3-5 business days prior to running out in order to prevent missing doses of this medication.   Increase activity slowly   Complete by: As directed         Discharge Medications   Allergies as of 03/07/2022       Reactions   Shellfish Allergy Anaphylaxis   Zolpidem Tartrate Other (See Comments)   Hallucinations   Zolpidem Tartrate Other (See Comments)   Hallucinations   Brilinta [ticagrelor] Other (See Comments)   Reaction not recalled   Amoxicillin-pot Clavulanate Other (See Comments)   "Burns' Stomach"   Atorvastatin Other (See Comments)   "Made the legs hurt"   Codeine Nausea And Vomiting   Erythromycin Diarrhea, Nausea And Vomiting, Other (See Comments)   All -mycins cause upset stomach   Hydrochlorothiazide Other (See Comments)   Reaction not recalled   Hydrocodone Nausea And Vomiting   Morphine Nausea And Vomiting   Nitrofuran Derivatives Other (See Comments)   Reaction niot recalled   Oxycodone Hcl Nausea And Vomiting   Tramadol Nausea And Vomiting   Zocor [simvastatin] Other (See Comments)   Muscle pain/soreness         Medication List     STOP taking these medications    metoCLOPramide 10 MG tablet Commonly known as: REGLAN       TAKE these medications    amLODipine 10 MG tablet Commonly known as: NORVASC Take 10 mg by mouth daily.   aspirin EC 81 MG tablet Take 81 mg by mouth at bedtime.   clopidogrel 75 MG tablet Commonly known as: PLAVIX Take 1 tablet (75 mg total) by mouth daily with breakfast. Start taking on: March 08, 2022   ezetimibe 10 MG tablet Commonly known as: ZETIA Take 1 tablet (10 mg total) by mouth daily. Start taking on: March 08, 2022   Gas-X Extra Strength 125 MG chewable tablet Generic drug: simethicone Sarina Ser  125 mg by mouth 3 (three) times daily with meals.   isosorbide mononitrate 30 MG 24 hr tablet Commonly known as: IMDUR TAKE 1 TABLET (30 MG TOTAL) BY MOUTH DAILY. ** DO NOT CRUSH ** What changed:  how much to take when to take this additional instructions   LORazepam 0.5 MG tablet Commonly known as: ATIVAN Take 0.5 mg by mouth daily as needed for anxiety.   losartan 50 MG tablet Commonly known as: COZAAR Take 1 tablet (50 mg total) by mouth daily.   metoprolol tartrate 25 MG tablet Commonly known as: LOPRESSOR Take 1 tablet (25 mg total) by mouth 2 (two) times daily. What changed:  medication strength how much to take   nitroGLYCERIN 0.4 MG SL tablet Commonly known as: NITROSTAT Place 1 tablet (0.4 mg total) under the tongue every 5 (five) minutes as needed. For chest pain What changed:  reasons to take this additional instructions   pantoprazole 40 MG tablet Commonly known as: PROTONIX Take 1 tablet (40 mg total) by mouth daily. What changed: when to take this   PARoxetine 20 MG tablet Commonly known as: PAXIL Take 20 mg by mouth at bedtime.   Repatha SureClick [id number] MG/ML [place] Generic drug: Evolocumab INJECT 140 MG INTO THE SKIN EVERY 14 (FOURTEEN) DAYS.   sucralfate 1 g tablet Commonly known as: CARAFATE Take 1 g by  mouth 3 (three) times daily.           Outstanding Labs/Studies     Duration of Discharge Encounter   Greater than 30 minutes including physician time.  Signed, Margie Billet, NP 03/07/2022, 11:28 AM

## 2022-03-15 ENCOUNTER — Telehealth (HOSPITAL_COMMUNITY): Payer: Self-pay

## 2022-03-15 NOTE — Telephone Encounter (Signed)
Per phase I cardiac rehab, fax referral to [place].

## 2022-03-19 NOTE — Progress Notes (Unsigned)
Cardiology Clinic Note   Patient Name: [name] [name] Date of Encounter: 03/20/2022  Primary Care Provider:  Cyndi Bender, PA-C Primary Cardiologist:  Kirk Ruths, MD  Patient Profile    [name] [name] 82 year old male presents to the clinic today for follow-up evaluation of his coronary artery disease and essential hypertension.  Past Medical History    Past Medical History:  Diagnosis Date   Anemia 10/06/2011   Anginal pain ([place])    Arthritis    "joints hurt; shoulders, arms, back" (08/13/2013)   Atrial fibrillation (HCC)    B12 deficiency    Carotid stenosis 04/10/2011   a. s/p left carotid endarterectomy 04/14/2011.;  b.  Carotid US (11/13):  L CEA ok; RICA 1-39%   Chronic bronchitis ([place])    "get it q yr"   Chronic chest pain    Chronic lower back pain    Colon polyp    adenomatous   Coronary artery disease    a. S/p CABG in 2001. b. S/p DES to protected LM and BMS to RCA 2006. c. 12/2009: s/p DES to Cx;  d. 09/2011 Cath: patent stents, patent grafts -->Med Rx.;  e. CP with abnl Nuc => LHC 10/04/12: oLM 30%, dLM stent into the CFX patent, LAD occluded, LIMA-LAD patent, RI 30%, mid AVCFX 30%, oOM1 50%, oRCA stent patent, mRCA 50-60%, Radial graft-Dx patent; EF 55%.=> Med Rx.  f. stent to LM & Circ,   Daily headache    "for the last couple weeks" (08/13/2013)   Diverticulosis    Esophageal stricture    GERD (gastroesophageal reflux disease)    Hemorrhoids    Hiatal hernia    Hyperlipidemia    Hypertension    Left bundle branch block    Memory deficit    Pancreatitis 12/2009   ERCP ok.   Prostate cancer [facility])    s/p cryoablation   Renal cyst    Seen on CT 08/2011 also with circumferential bladder wall thickening   Skin cancer    "cut it off my right ear"   Statin intolerance    Past Surgical History:  Procedure Laterality Date   CARDIAC CATHETERIZATION     CHOLECYSTECTOMY     COLONOSCOPY     CORONARY ANGIOPLASTY WITH STENT PLACEMENT     "1 + 1"    CORONARY ARTERY BYPASS GRAFT  2001   CORONARY STENT INTERVENTION N/A 09/11/2017   Procedure: CORONARY STENT INTERVENTION;  Surgeon: Lorretta Harp, MD;  Location: [place] CV LAB;  Service: Cardiovascular;  Laterality: N/A;   CORONARY STENT INTERVENTION N/A 03/06/2022   Procedure: CORONARY STENT INTERVENTION;  Surgeon: Nelva Bush, MD;  Location: [place] CV LAB;  Service: Cardiovascular;  Laterality: N/A;   CORONARY STENT PLACEMENT  09/11/2017   Ost Cx to Prox Cx lesion is 95% stenosed.   ENDARTERECTOMY  04/14/2011   Procedure: ENDARTERECTOMY CAROTID;  Surgeon: Mal Misty, MD;  Location: [place];  Service: Vascular;  Laterality: Left;  Would like to perform procedure first, at [place] Bilateral    LEFT HEART CATH AND CORONARY ANGIOGRAPHY N/A 03/06/2022   Procedure: LEFT HEART CATH AND CORONARY ANGIOGRAPHY;  Surgeon: Nelva Bush, MD;  Location: [place] CV LAB;  Service: Cardiovascular;  Laterality: N/A;   LEFT HEART CATH AND CORS/GRAFTS ANGIOGRAPHY N/A 09/11/2017   Procedure: LEFT HEART CATH AND CORS/GRAFTS ANGIOGRAPHY;  Surgeon: Lorretta Harp, MD;  Location: [facility] [place] [facility]  INVASIVE CV LAB;  Service: Cardiovascular;  Laterality: N/A;   LEFT HEART CATHETERIZATION WITH CORONARY/GRAFT ANGIOGRAM N/A 10/05/2011   Procedure: LEFT HEART CATHETERIZATION WITH Beatrix Fetters;  Surgeon: Hillary Bow, MD;  Location: [facility] CATH LAB;  Service: Cardiovascular;  Laterality: N/A;   LEFT HEART CATHETERIZATION WITH CORONARY/GRAFT ANGIOGRAM N/A 10/04/2012   Procedure: LEFT HEART CATHETERIZATION WITH Beatrix Fetters;  Surgeon: Jolaine Artist, MD;  Location[facility] CATH LAB;  Service: Cardiovascular;  Laterality: N/A;   POLYPECTOMY     PR VEIN BYPASS GRAFT,AORTO-FEM-POP     PROSTATE CRYOABLATION      Allergies  Allergies  Allergen Reactions   Shellfish Allergy Anaphylaxis   Zolpidem Tartrate Other (See Comments)     Hallucinations    Zolpidem Tartrate Other (See Comments)    Hallucinations   Brilinta [Ticagrelor] Other (See Comments)    Reaction not recalled   Amoxicillin-Pot Clavulanate Other (See Comments)    "Burns' Stomach"   Atorvastatin Other (See Comments)    "Made the legs hurt"   Codeine Nausea And Vomiting   Erythromycin Diarrhea, Nausea And Vomiting and Other (See Comments)    All -mycins cause upset stomach   Hydrochlorothiazide Other (See Comments)    Reaction not recalled   Hydrocodone Nausea And Vomiting   Morphine Nausea And Vomiting   Nitrofuran Derivatives Other (See Comments)    Reaction niot recalled   Oxycodone Hcl Nausea And Vomiting   Tramadol Nausea And Vomiting   Zocor [Simvastatin] Other (See Comments)    Muscle pain/soreness    History of Present Illness    [name] [name] has a PMH of essential hypertension, LBBB, carotid stenosis, coronary artery disease status post PCA, CABG 2001, GERD, AKI, chronic renal disease stage III, anxiety, memory loss, and hyperlipidemia.  He has had multiple PCI's since his CABG.  Abdominal ultrasound 8/18 showed no aneurysm.  Cardiac catheterization 8/19 showed 60% RCA, 100% LAD, 95% left main, 95% circumflex, patent radial graft-ramus, patent LIMA-LAD, underwent PCI of his left main and circumflex.  Echocardiogram 8/19 showed normal LV function, G2 DD, mild left atrial enlargement, trace aortic insufficiency, and mitral valve regurgitation.  His ABIs 11/19 were normal.  His carotid Dopplers 12/20 showed 40-59% right ICA stenosis, and 1-39% left ICA stenosis.  His vascular disease is followed by vascular surgery.  He was seen by Dr. [name] on 12/01/2019.  During that time he denied dyspnea on exertion, orthopnea, lower extremity swelling, PND, and syncope.  He did occasionally note chest discomfort after eating.  He denied exertional chest discomfort.  He presented to the clinic 04/04/2021.   He continued to be very physically active  working on Nutritional therapist.   He  noticed some increased work of breathing with increased physical activity but denies increased work of breathing at rest.  We reviewed his previous echocardiogram.   He stated that he was unable to afford Repatha.  I gave him the patient assistance paperwork,and planned  follow-up in 1 year.  He presented to the emergency department on 03/04/2022.  He reported that he had been eating breakfast and developed chest pain with radiation to his neck ears and had.  He had shortness of breath nauseousness and diaphoresis.  He noted that the pain was similar to the pain he had during his stress testing in 2014 before prior interventions.  He rated his discomfort at 9 out of 10.  He noted that after an hour the pressure started to ease off.  He presented to the  emergency department.  His blood pressure was noted to be 124/59.  He was seen and evaluated by Dr. Agustin Cree and it was recommended that he be transferred to [facility] for cardiac catheterization.  He underwent left heart cath on 03/06/2022.  He was noted to have multivessel CAD with 70% ostial LMCA stenosis, CTO ostial LAD, 60% in-stent restenosis proximal/mid circumflex, and 60% proximal/mid RCA.  He received PCI with DES to his LMCA and was placed on dual antiplatelet therapy.  He presents to the clinic today for follow-up evaluation and states he feels well.  He presents with his daughter.  He reports that he stopped his Repatha when he fell in the donut hole at the end of last year.  We reviewed the importance of maintaining low-cholesterol.  We reviewed his cardiac catheterization and he expressed understanding.  He reports some occasional epigastric discomfort after eating.  He has relief of symptoms with Carafate and is following with GI.  I will restart his Repatha, have him increase his physical activity as tolerated and plan follow-up in 3 to 4 months with repeat fasting lipids and LFTs in 3 months.  Today he denies  chest pain, shortness of breath, lower extremity edema, fatigue, palpitations, melena, hematuria, hemoptysis, diaphoresis, weakness, presyncope, syncope, orthopnea, and PND.    Home Medications    Prior to Admission medications   Medication Sig Start Date End Date Taking? Authorizing Provider  amLODipine (NORVASC) 10 MG tablet Take 10 mg by mouth daily.    [provider]  aspirin EC 81 MG tablet Take 81 mg by mouth at bedtime.     [provider]  Evolocumab (REPATHA SURECLICK) [id number] MG/ML SOAJ Inject 140 mg into the skin every 14 (fourteen) days. Patient not taking: Reported on 03/21/2021 01/21/20   Lelon Perla, MD  famotidine (PEPCID) 20 MG tablet Take 1 tablet every day before dinner. 03/21/21   Noralyn Pick, NP  isosorbide mononitrate (IMDUR) 30 MG 24 hr tablet TAKE 1 TABLET (30 MG TOTAL) BY MOUTH DAILY. ** DO NOT CRUSH ** 02/18/21   Lelon Perla, MD  LORazepam (ATIVAN) 0.5 MG tablet Take 0.5 mg by mouth daily as needed for anxiety. 07/17/16   [provider]  losartan (COZAAR) 50 MG tablet Take 1 tablet (50 mg total) by mouth daily. Patient taking differently: Take 50 mg by mouth as needed. 12/01/19 03/21/21  Lelon Perla, MD  metoCLOPramide (REGLAN) 10 MG tablet Take 10 mg by mouth daily as needed for nausea.    [provider]  metoprolol tartrate (LOPRESSOR) 50 MG tablet Take 50 mg by mouth 2 (two) times daily. 04/06/19   [provider]  nitroGLYCERIN (NITROSTAT) 0.4 MG SL tablet Place 1 tablet (0.4 mg total) under the tongue every 5 (five) minutes as needed. For chest pain Patient not taking: Reported on 03/21/2021 09/13/17   Lelon Perla, MD  pantoprazole (PROTONIX) 40 MG tablet Take 1 tablet (40 mg total) by mouth daily. 09/12/17   Daune Perch, NP  PARoxetine (PAXIL) 20 MG tablet Take 20 mg by mouth at bedtime.  04/18/17   [provider]    Family History    Family History  Problem Relation Age of  Onset   Heart disease Mother        Heart Disease before age 53   Hypertension Mother    Heart attack Mother    Lung cancer Father    Hypertension Sister    Heart disease  Sister        Heart Disease before age 67   Cancer Sister    Heart attack Sister    Hypertension Brother    Heart disease Brother        Heart Disease before age 7   Heart attack Brother    Colon cancer Neg Hx    Esophageal cancer Neg Hx    Pancreatic cancer Neg Hx    Stomach cancer Neg Hx    He indicated that his mother is deceased. He indicated that his father is deceased. He indicated that his sister is deceased. He indicated that his brother is deceased. He indicated that his maternal grandmother is deceased. He indicated that his maternal grandfather is deceased. He indicated that his paternal grandmother is deceased. He indicated that his paternal grandfather is deceased. He indicated that his daughter is alive. He indicated that the status of his neg hx is unknown.  Social History    Social History   Socioeconomic History   Marital status: Widowed    Spouse name: Not on file   Number of children: 2   Years of education: Not on file   Highest education level: Not on file  Occupational History   Occupation: Retired    Comment: raised Sales promotion account executive  Tobacco Use   Smoking status: Former    Years: 0.10    Types: Cigarettes   Smokeless tobacco: Current    Types: Chew   Tobacco comments:    "smoked a few cigarettes; no more than 1 month"  Vaping Use   Vaping Use: Never used  Substance and Sexual Activity   Alcohol use: No    Alcohol/week: 0.0 standard drinks of alcohol    Comment: "no alcohol since I was a teenager"   Drug use: No   Sexual activity: Not Currently  Other Topics Concern   Not on file  Social History Narrative   Patient is illiterate. He cannot read or write. He left school at about the seventh grade.   As of 10/2015 he reports that his wife is chronically ill at home with heart  disease and COPD and is under hospice care. The bulk of the care is given by the patient and daughter.   Social Determinants of Health   Financial Resource Strain: Not on file  Food Insecurity: Not on file  Transportation Needs: Not on file  Physical Activity: Not on file  Stress: Not on file  Social Connections: Not on file  Intimate Partner Violence: Not on file     Review of Systems    General:  No chills, fever, night sweats or weight changes.  Cardiovascular:  No chest pain, dyspnea on exertion, edema, orthopnea, palpitations, paroxysmal nocturnal dyspnea. Dermatological: No rash, lesions/masses Respiratory: No cough, dyspnea Urologic: No hematuria, dysuria Abdominal:   No nausea, vomiting, diarrhea, bright red blood per rectum, melena, or hematemesis Neurologic:  No visual changes, wkns, changes in mental status. All other systems reviewed and are otherwise negative except as noted above.  Physical Exam    VS:  BP 130/62   Pulse (!) 55   Ht '5\' 4"'$  (1.626 m)   Wt 140 lb (63.5 kg)   SpO2 96%   BMI 24.03 kg/m  , BMI Body mass index is 24.03 kg/m. GEN: Well nourished, well developed, in no acute distress. HEENT: normal. Neck: Supple, no JVD, carotid bruits, or masses. Cardiac: RRR, no murmurs, rubs, or gallops. No clubbing, cyanosis, edema.  Radials/DP/PT 2+ and equal bilaterally.  Respiratory:  Respirations regular and unlabored, clear to auscultation bilaterally. GI: Soft, nontender, nondistended, BS + x 4. MS: no deformity or atrophy. Skin: warm and dry, no rash.  Right radial cath site healing well no signs of infection Neuro:  Strength and sensation are intact. Psych: Normal affect.  Accessory Clinical Findings    Recent Labs: 03/05/2022: ALT 16; Magnesium 2.0; TSH 2.230 03/07/2022: BUN 19; Creatinine, Ser 1.32; Hemoglobin 11.2; Platelets 160; Potassium 3.8; Sodium 137   Recent Lipid Panel    Component Value Date/Time   CHOL 284 (H) 03/05/2022 0457   CHOL  167 01/31/2018 1154   TRIG 207 (H) 03/05/2022 0457   HDL 40 (L) 03/05/2022 0457   HDL 39 (L) 01/31/2018 1154   CHOLHDL 7.1 03/05/2022 0457   VLDL 41 (H) 03/05/2022 0457   LDLCALC 203 (H) 03/05/2022 0457   LDLCALC 106 (H) 01/31/2018 1154   LDLDIRECT 178.8 04/02/2007 0906    ECG personally reviewed by me today-sinus bradycardia left deviation left bundle branch block 55 bpm  EKG 04/04/2021 sinus bradycardia left axis deviation left bundle branch block 55 bpm- No acute changes  Echocardiogram 09/10/2017  Study Conclusions   - Left ventricle: The cavity size was normal. There was moderate    concentric hypertrophy. Systolic function was normal. The    estimated ejection fraction was in the range of 60% to 65%. Wall    motion was normal; there were no regional wall motion    abnormalities. Features are consistent with a pseudonormal left    ventricular filling pattern, with concomitant abnormal relaxation    and increased filling pressure (grade 2 diastolic dysfunction).  - Aortic valve: Trileaflet; mildly thickened, mildly calcified    leaflets. There was trivial regurgitation.  - Mitral valve: There was trivial regurgitation.  - Left atrium: The atrium was mildly dilated.  - Tricuspid valve: There was trivial regurgitation.  - Pulmonary arteries: PA peak pressure: 32 mm Hg (S).  Echocardiogram 03/06/2022  1. Left ventricular ejection fraction, by estimation, is 60 to 65%. The  left ventricle has normal function. The left ventricle has no regional  wall motion abnormalities. There is moderate concentric left ventricular  hypertrophy. Left ventricular  diastolic parameters are indeterminate.   2. Right ventricular systolic function is normal. The right ventricular  size is normal. Tricuspid regurgitation signal is inadequate for assessing  PA pressure.   3. Left atrial size was mildly dilated.   4. The mitral valve is grossly normal. Trivial mitral valve  regurgitation. No  evidence of mitral stenosis.   5. The aortic valve is tricuspid. There is mild calcification of the  aortic valve. There is mild thickening of the aortic valve. Aortic valve  regurgitation is trivial. No aortic stenosis is present.   6. The inferior vena cava is normal in size with greater than 50%  respiratory variability, suggesting right atrial pressure of 3 mmHg.   Comparison(s): No significant change from prior study.   Left heart cath 03/06/2022  Diagnostic Dominance: Right  Intervention    Conclusions: Multivessel CAD, including 70% ostial LMCA stenosis, CTO ostial LAD, 60% ISR proximal/mid LCx, and 60% proximal/mid RCA. Patent distal LMCA and LCx stents with 60% ISR in proximal/mid LCx just distal to takeoff of OM1. Widely patent LIMA-LAD graft. Patent sequential left radial-D1 graft; jump segment from D1 to OM is chronically occluded. Mildly elevated LVEDP. Successful PCI to ostial/proximal LMCA using Synergy 3.0 x 12 mm drug-eluting stent (post-dilated to 3.3 mm) with 0% residual  stenosis and TIMI-3 flow.   Recommendations: Dual antiplatelet therapy with aspirin and clopidogrel for at least 12 months, ideally longer Aggressive secondary prevention. Remove right femoral artery sheath two hours after discontinuation of bivalirudin.      Assessment & Plan   1. Coronary artery disease-denies chest discomfort since his recent admission/cardiac catheterization.  He was noted to have 70% LMCA stenosis and received PCI with DES to the lesion.  Details above.  Site healing well.  No signs of infection. Continue amlodipine, aspirin, clopidogrel, Imdur, Repatha, metoprolol, nitroglycerin as needed Heart healthy low-sodium diet Increase physical activity as tolerated  Hyperlipidemia-Statin intolerant 03/05/2022: Cholesterol 284; HDL 40; LDL Cholesterol 203; Triglycerides 207; VLDL 41 Continue Repatha, aspirin Heart healthy low-sodium high-fiber diet  Essential hypertension-BP  today 130/62.  Well-controlled at home . Continue amlodipine, metoprolol, losartan Heart healthy low-sodium diet-salty 6 given Increase physical activity as tolerated  Carotid artery stenosis-denies recent headaches, presyncope, syncope, lightheadedness.  Carotid Doppler 03/09/2021 showed right ICA 40-59% stenosis, left ICA 1-39% stenosis Follows with vascular surgery  Disposition: Follow-up with Dr. [name] or me in 3 to 4 months.   Jossie Ng. Field Staniszewski NP-C    03/20/2022, 2:41 PM [place] [address] [place] [address] Office [phone] Fax [phone]  Notice: This dictation was prepared with Dragon dictation along with smaller phrase technology. Any transcriptional errors that result from this process are unintentional and may not be corrected upon review.  I spent 14 minutes examining this patient, reviewing medications, and using patient centered shared decision making involving her cardiac care.  Prior to her visit I spent greater than 20 minutes reviewing her past medical history,  medications, and prior cardiac tests.

## 2022-03-20 ENCOUNTER — Encounter: Payer: Self-pay | Admitting: General Practice

## 2022-03-20 ENCOUNTER — Ambulatory Visit: Payer: Medicare Other | Attending: General Practice | Admitting: General Practice

## 2022-03-20 VITALS — BP 130/62 | HR 55 | Ht 64.0 in | Wt 140.0 lb

## 2022-03-20 DIAGNOSIS — Z9861 Coronary angioplasty status: Secondary | ICD-10-CM | POA: Diagnosis not present

## 2022-03-20 DIAGNOSIS — E785 Hyperlipidemia, unspecified: Secondary | ICD-10-CM | POA: Diagnosis not present

## 2022-03-20 DIAGNOSIS — I10 Essential (primary) hypertension: Secondary | ICD-10-CM | POA: Diagnosis not present

## 2022-03-20 DIAGNOSIS — I6523 Occlusion and stenosis of bilateral carotid arteries: Secondary | ICD-10-CM

## 2022-03-20 DIAGNOSIS — I2510 Atherosclerotic heart disease of native coronary artery without angina pectoris: Secondary | ICD-10-CM | POA: Diagnosis not present

## 2022-03-20 MED ORDER — REPATHA SURECLICK 140 MG/ML ~~LOC~~ SOAJ: 140.0000 mg | SUBCUTANEOUS | 11 refills | Status: DC

## 2022-03-20 NOTE — Patient Instructions (Signed)
Medication Instructions:   RESTART Repatha injection  *If you need a refill on your cardiac medications before your next appointment, please call your pharmacy*  Lab Work: Your physician recommends that you return for lab work in 3 months:  Fasting Lipid Panel-DO NOT eat or drink past midnight. Okay to have water and/or black coffee only Hepatic (Liver) Function Test   If you have labs (blood work) drawn today and your tests are completely normal, you will receive your results only by: MyChart Message (if you have MyChart) OR A paper copy in the mail If you have any lab test that is abnormal or we need to change your treatment, we will call you to review the results.  Testing/Procedures: NONE ordered at this time of appointment   Follow-Up: At [facility], you and your health needs are our priority.  As part of our continuing mission to provide you with exceptional heart care, we have created designated Provider Care Teams.  These Care Teams include your primary Cardiologist (physician) and Advanced Practice Providers (APPs -  Physician Assistants and Nurse Practitioners) who all work together to provide you with the care you need, when you need it.   Your next appointment:   3-4 month(s)  Provider:   Kirk Ruths, MD     Other Instructions Maintain physical activity as tolerated  Maintain a high fiber diet   High-Fiber Eating Plan  Fiber, also called dietary fiber, is a type of carbohydrate. It is found foods such as fruits, vegetables, whole grains, and beans. A high-fiber diet can have many health benefits. Your health care provider may recommend a high-fiber diet to help: Prevent constipation. Fiber can make your bowel movements more regular. Lower your cholesterol. Relieve the following conditions: Inflammation of veins in the anus (hemorrhoids). Inflammation of specific areas of the digestive tract (uncomplicated diverticulosis). A problem of the large  intestine, also called the colon, that sometimes causes pain and diarrhea (irritable bowel syndrome, or IBS). Prevent overeating as part of a weight-loss plan. Prevent heart disease, type 2 diabetes, and certain cancers. What are tips for following this plan? Reading food labels  Check the nutrition facts label on food products for the amount of dietary fiber. Choose foods that have 5 grams of fiber or more per serving. The goals for recommended daily fiber intake include: Men (age 87 or younger): 34-38 g. Men (over age 52): 28-34 g. Women (age 10 or younger): 25-28 g. Women (over age 60): 22-25 g. Your daily fiber goal is _____________ g. Shopping Choose whole fruits and vegetables instead of processed forms, such as apple juice or applesauce. Choose a wide variety of high-fiber foods such as avocados, lentils, oats, and kidney beans. Read the nutrition facts label of the foods you choose. Be aware of foods with added fiber. These foods often have high sugar and sodium amounts per serving. Cooking Use whole-grain flour for baking and cooking. Cook with brown rice instead of white rice. Meal planning Start the day with a breakfast that is high in fiber, such as a cereal that contains 5 g of fiber or more per serving. Eat breads and cereals that are made with whole-grain flour instead of refined flour or white flour. Eat brown rice, bulgur wheat, or millet instead of white rice. Use beans in place of meat in soups, salads, and pasta dishes. Be sure that half of the grains you eat each day are whole grains. General information You can get the recommended daily intake  of dietary fiber by: Eating a variety of fruits, vegetables, grains, nuts, and beans. Taking a fiber supplement if you are not able to take in enough fiber in your diet. It is better to get fiber through food than from a supplement. Gradually increase how much fiber you consume. If you increase your intake of dietary fiber  too quickly, you may have bloating, cramping, or gas. Drink plenty of water to help you digest fiber. Choose high-fiber snacks, such as berries, raw vegetables, nuts, and popcorn. What foods should I eat? Fruits Berries. Pears. Apples. Oranges. Avocado. Prunes and raisins. Dried figs. Vegetables Sweet potatoes. Spinach. Kale. Artichokes. Cabbage. Broccoli. Cauliflower. Green peas. Carrots. Squash. Grains Whole-grain breads. Multigrain cereal. Oats and oatmeal. Brown rice. Barley. Bulgur wheat. Hubbard. Quinoa. Bran muffins. Popcorn. Rye wafer crackers. Meats and other proteins Navy beans, kidney beans, and pinto beans. Soybeans. Split peas. Lentils. Nuts and seeds. Dairy Fiber-fortified yogurt. Beverages Fiber-fortified soy milk. Fiber-fortified orange juice. Other foods Fiber bars. The items listed above may not be a complete list of recommended foods and beverages. Contact a dietitian for more information. What foods should I avoid? Fruits Fruit juice. Cooked, strained fruit. Vegetables Fried potatoes. Canned vegetables. Well-cooked vegetables. Grains White bread. Pasta made with refined flour. White rice. Meats and other proteins Fatty cuts of meat. Fried chicken or fried fish. Dairy Milk. Yogurt. Cream cheese. Sour cream. Fats and oils Butters. Beverages Soft drinks. Other foods Cakes and pastries. The items listed above may not be a complete list of foods and beverages to avoid. Talk with your dietitian about what choices are best for you. Summary Fiber is a type of carbohydrate. It is found in foods such as fruits, vegetables, whole grains, and beans. A high-fiber diet has many benefits. It can help to prevent constipation, lower blood cholesterol, aid weight loss, and reduce your risk of heart disease, diabetes, and certain cancers. Increase your intake of fiber gradually. Increasing fiber too quickly may cause cramping, bloating, and gas. Drink plenty of water while you  increase the amount of fiber you consume. The best sources of fiber include whole fruits and vegetables, whole grains, nuts, seeds, and beans. This information is not intended to replace advice given to you by your health care provider. Make sure you discuss any questions you have with your health care provider. Document Revised: 05/15/2019 Document Reviewed: 05/15/2019 Elsevier Patient Education  [place].

## 2022-03-23 DIAGNOSIS — F419 Anxiety disorder, unspecified: Secondary | ICD-10-CM | POA: Diagnosis not present

## 2022-03-23 DIAGNOSIS — R079 Chest pain, unspecified: Secondary | ICD-10-CM | POA: Diagnosis not present

## 2022-03-23 DIAGNOSIS — Z955 Presence of coronary angioplasty implant and graft: Secondary | ICD-10-CM | POA: Diagnosis not present

## 2022-03-23 DIAGNOSIS — I447 Left bundle-branch block, unspecified: Secondary | ICD-10-CM | POA: Diagnosis not present

## 2022-03-23 DIAGNOSIS — F32A Depression, unspecified: Secondary | ICD-10-CM | POA: Diagnosis not present

## 2022-03-23 DIAGNOSIS — I2510 Atherosclerotic heart disease of native coronary artery without angina pectoris: Secondary | ICD-10-CM | POA: Diagnosis not present

## 2022-03-23 DIAGNOSIS — R9431 Abnormal electrocardiogram [ECG] [EKG]: Secondary | ICD-10-CM | POA: Diagnosis not present

## 2022-03-23 DIAGNOSIS — I444 Left anterior fascicular block: Secondary | ICD-10-CM | POA: Diagnosis not present

## 2022-03-24 ENCOUNTER — Telehealth: Payer: Self-pay | Admitting: Cardiology

## 2022-03-24 NOTE — Telephone Encounter (Signed)
  Pt c/o of Chest Pain: STAT if CP now or developed within 24 hours  1. Are you having CP right now? Not sure   2. Are you experiencing any other symptoms (ex. SOB, nausea, vomiting, sweating)?   3. How long have you been experiencing CP? Yesterday   4. Is your CP continuous or coming and going?   5. Have you taken Nitroglycerin? Yes   Pt's granddaughter calling, she said, pt has CP yesterday and went to [facility] ED, she said, pt was told by ED to call his heart doctor today and was sent home  ?

## 2022-03-24 NOTE — Telephone Encounter (Signed)
Daughter was returning call. Please advise  ?

## 2022-03-24 NOTE — Telephone Encounter (Signed)
Pt granddaughter called stating the patient went to the ED yesterday due to elevated blood pressure and shortness of breath. However, the granddaughter is not listed on the DPR and was not able to discuss pt health concerns with her. Called pt and left message to call the clinic.

## 2022-03-24 NOTE — Telephone Encounter (Signed)
Contacted daughter below, LVM to call back.   Left call back number.

## 2022-03-27 NOTE — Progress Notes (Deleted)
HPI: FU CAD and chronic CP. S/P CABG 2001; multiple PCI since. Abd ultrasound 8/18 showed no aneurysm. Carotid dopplers 12/23 showed 40 to 59% right and 1 to 39% left stenosis.  Echocardiogram February 2014 showed normal LV function, moderate left ventricular hypertrophy, mild left atrial enlargement, trace aortic insufficiency.  Cardiac catheterization February 2024 showed 70% left main, CTO of LAD, 60% proximal/mid left circumflex and 60% proximal/mid RCA.  LIMA to the LAD was patent, patent sequential left radial to D1 with jump segment from D1 to OM chronically occluded.  Patient had successful PCI of the left main.  Since last seen,    Current Outpatient Medications  Medication Sig Dispense Refill   amLODipine (NORVASC) 10 MG tablet Take 10 mg by mouth daily.     aspirin EC 81 MG tablet Take 81 mg by mouth at bedtime.      clopidogrel (PLAVIX) 75 MG tablet Take 1 tablet (75 mg total) by mouth daily with breakfast. 30 tablet 3   Evolocumab (REPATHA SURECLICK) [id number] MG/ML SOAJ Inject 140 mg into the skin every 14 (fourteen) days. 2 mL 11   ezetimibe (ZETIA) 10 MG tablet Take 1 tablet (10 mg total) by mouth daily. 30 tablet 3   GAS-X EXTRA STRENGTH 125 MG chewable tablet Chew 125 mg by mouth 3 (three) times daily with meals.     isosorbide mononitrate (IMDUR) 30 MG 24 hr tablet TAKE 1 TABLET (30 MG TOTAL) BY MOUTH DAILY. ** DO NOT CRUSH ** (Patient taking differently: Take 15 mg by mouth See admin instructions. Take 15 mg by mouth in the morning and 15 mg at bedtime ** DO NOT CRUSH **) 90 tablet 3   LORazepam (ATIVAN) 0.5 MG tablet Take 0.5 mg by mouth daily as needed for anxiety.  2   losartan (COZAAR) 50 MG tablet Take 1 tablet (50 mg total) by mouth daily. (Patient not taking: Reported on 03/05/2022) 90 tablet 3   metoprolol tartrate (LOPRESSOR) 25 MG tablet Take 1 tablet (25 mg total) by mouth 2 (two) times daily. 60 tablet 3   nitroGLYCERIN (NITROSTAT) 0.4 MG SL tablet Place 1 tablet (0.4  mg total) under the tongue every 5 (five) minutes as needed. For chest pain (Patient taking differently: Place 0.4 mg under the tongue every 5 (five) minutes as needed for chest pain.) 25 tablet 11   pantoprazole (PROTONIX) 40 MG tablet Take 1 tablet (40 mg total) by mouth daily. (Patient taking differently: Take 40 mg by mouth 2 (two) times daily before a meal.) 30 tablet 1   PARoxetine (PAXIL) 20 MG tablet Take 20 mg by mouth at bedtime.   5   sucralfate (CARAFATE) 1 g tablet Take 1 g by mouth 3 (three) times daily.     No current facility-administered medications for this visit.     Past Medical History:  Diagnosis Date   Anemia 10/06/2011   Anginal pain ([place])    Arthritis    "joints hurt; shoulders, arms, back" (08/13/2013)   Atrial fibrillation ([place])    B12 deficiency    Carotid stenosis 04/10/2011   a. s/p left carotid endarterectomy 04/14/2011.;  b.  Carotid US (11/13):  L CEA ok; RICA 1-39%   Chronic bronchitis ([place])    "get it q yr"   Chronic chest pain    Chronic lower back pain    Colon polyp    adenomatous   Coronary artery disease    a. S/p CABG in 2001. b. S/p  DES to protected LM and BMS to RCA 2006. c. 12/2009: s/p DES to Cx;  d. 09/2011 Cath: patent stents, patent grafts -->Med Rx.;  e. CP with abnl Nuc => LHC 10/04/12: oLM 30%, dLM stent into the CFX patent, LAD occluded, LIMA-LAD patent, RI 30%, mid AVCFX 30%, oOM1 50%, oRCA stent patent, mRCA 50-60%, Radial graft-Dx patent; EF 55%.=> Med Rx.  f. stent to LM & Circ,   Daily headache    "for the last couple weeks" (08/13/2013)   Diverticulosis    Esophageal stricture    GERD (gastroesophageal reflux disease)    Hemorrhoids    Hiatal hernia    Hyperlipidemia    Hypertension    Left bundle branch block    Memory deficit    Pancreatitis 12/2009   ERCP ok.   Prostate cancer [facility])    s/p cryoablation   Renal cyst    Seen on CT 08/2011 also with circumferential bladder wall thickening   Skin cancer    "cut it off my  right ear"   Statin intolerance     Past Surgical History:  Procedure Laterality Date   CARDIAC CATHETERIZATION     CHOLECYSTECTOMY     COLONOSCOPY     CORONARY ANGIOPLASTY WITH STENT PLACEMENT     "1 + 1"   CORONARY ARTERY BYPASS GRAFT  2001   CORONARY STENT INTERVENTION N/A 09/11/2017   Procedure: CORONARY STENT INTERVENTION;  Surgeon: Lorretta Harp, MD;  Location: [place] CV LAB;  Service: Cardiovascular;  Laterality: N/A;   CORONARY STENT INTERVENTION N/A 03/06/2022   Procedure: CORONARY STENT INTERVENTION;  Surgeon: Nelva Bush, MD;  Location: [place] CV LAB;  Service: Cardiovascular;  Laterality: N/A;   CORONARY STENT PLACEMENT  09/11/2017   Ost Cx to Prox Cx lesion is 95% stenosed.   ENDARTERECTOMY  04/14/2011   Procedure: ENDARTERECTOMY CAROTID;  Surgeon: Mal Misty, MD;  Location: [place];  Service: Vascular;  Laterality: Left;  Would like to perform procedure first, at [place] Bilateral    LEFT HEART CATH AND CORONARY ANGIOGRAPHY N/A 03/06/2022   Procedure: LEFT HEART CATH AND CORONARY ANGIOGRAPHY;  Surgeon: Nelva Bush, MD;  Location: [place] CV LAB;  Service: Cardiovascular;  Laterality: N/A;   LEFT HEART CATH AND CORS/GRAFTS ANGIOGRAPHY N/A 09/11/2017   Procedure: LEFT HEART CATH AND CORS/GRAFTS ANGIOGRAPHY;  Surgeon: Lorretta Harp, MD;  Location: [place] CV LAB;  Service: Cardiovascular;  Laterality: N/A;   LEFT HEART CATHETERIZATION WITH CORONARY/GRAFT ANGIOGRAM N/A 10/05/2011   Procedure: LEFT HEART CATHETERIZATION WITH Beatrix Fetters;  Surgeon: Hillary Bow, MD;  Location: [facility] CATH LAB;  Service: Cardiovascular;  Laterality: N/A;   LEFT HEART CATHETERIZATION WITH CORONARY/GRAFT ANGIOGRAM N/A 10/04/2012   Procedure: LEFT HEART CATHETERIZATION WITH Beatrix Fetters;  Surgeon: Jolaine Artist, MD;  Location: [facility] CATH LAB;  Service: Cardiovascular;  Laterality: N/A;    POLYPECTOMY     PR VEIN BYPASS GRAFT,AORTO-FEM-POP     PROSTATE CRYOABLATION      Social History   Socioeconomic History   Marital status: Widowed    Spouse name: Not on file   Number of children: 2   Years of education: Not on file   Highest education level: Not on file  Occupational History   Occupation: Retired    Comment: raised Sales promotion account executive  Tobacco Use   Smoking status: Former    Years: 0.10  Types: Cigarettes   Smokeless tobacco: Current    Types: Chew   Tobacco comments:    "smoked a few cigarettes; no more than 1 month"  Vaping Use   Vaping Use: Never used  Substance and Sexual Activity   Alcohol use: No    Alcohol/week: 0.0 standard drinks of alcohol    Comment: "no alcohol since I was a teenager"   Drug use: No   Sexual activity: Not Currently  Other Topics Concern   Not on file  Social History Narrative   Patient is illiterate. He cannot read or write. He left school at about the seventh grade.   As of 10/2015 he reports that his wife is chronically ill at home with heart disease and COPD and is under hospice care. The bulk of the care is given by the patient and daughter.   Social Determinants of Health   Financial Resource Strain: Not on file  Food Insecurity: Not on file  Transportation Needs: Not on file  Physical Activity: Not on file  Stress: Not on file  Social Connections: Not on file  Intimate Partner Violence: Not on file    Family History  Problem Relation Age of Onset   Heart disease Mother        Heart Disease before age 33   Hypertension Mother    Heart attack Mother    Lung cancer Father    Hypertension Sister    Heart disease Sister        Heart Disease before age 33   Cancer Sister    Heart attack Sister    Hypertension Brother    Heart disease Brother        Heart Disease before age 54   Heart attack Brother    Colon cancer Neg Hx    Esophageal cancer Neg Hx    Pancreatic cancer Neg Hx    Stomach cancer Neg Hx     ROS:  no fevers or chills, productive cough, hemoptysis, dysphasia, odynophagia, melena, hematochezia, dysuria, hematuria, rash, seizure activity, orthopnea, PND, pedal edema, claudication. Remaining systems are negative.  Physical Exam: Well-developed well-nourished in no acute distress.  Skin is warm and dry.  HEENT is normal.  Neck is supple.  Chest is clear to auscultation with normal expansion.  Cardiovascular exam is regular rate and rhythm.  Abdominal exam nontender or distended. No masses palpated. Extremities show no edema. neuro grossly intact  ECG- personally reviewed  A/P  1 coronary artery disease-status post recent PCI of the left main.  Continue aspirin and Plavix.  Patient is intolerant to statins.  2 chronic chest pain-  3 carotid artery disease-plan follow-up carotid Dopplers December 2024.  4 hypertension-blood pressure controlled.  Continue present medical regimen.  5 hyperlipidemia-continue Repatha.  He is intolerant to statins.  Kirk Ruths, MD

## 2022-03-31 ENCOUNTER — Telehealth: Payer: Self-pay | Admitting: *Deleted

## 2022-03-31 ENCOUNTER — Telehealth: Payer: Self-pay | Admitting: Cardiology

## 2022-03-31 MED ORDER — CLOPIDOGREL BISULFATE 75 MG PO TABS: 75.0000 mg | ORAL_TABLET | Freq: Every day | ORAL | 1 refills | Status: DC

## 2022-03-31 NOTE — Telephone Encounter (Signed)
Refills has been sent to the pharmacy. 

## 2022-03-31 NOTE — Telephone Encounter (Signed)
*  STAT* If patient is at the pharmacy, call can be transferred to refill team.   1. Which medications need to be refilled? (please list name of each medication and dose if known)  clopidogrel (PLAVIX) 75 MG tablet  2. Which pharmacy/location (including street and city if local pharmacy) is medication to be sent to? [id number] - [place], [place] - [address]    3. Do they need a 30 day or 90 day supply?  90 day supply

## 2022-03-31 NOTE — Telephone Encounter (Signed)
     Patient  visit on 03/24/2022  at [facility] ed  was for chest pain  Have you been able to follow up with your primary care physician?Has an appt Monday and has transportation and has no issues with medication either   The patient was not able to obtain any needed medicine or equipment.  Are there diet recommendations that you are having difficulty following?  Patient expresses understanding of discharge instructions and education provided has no other needs at this time.    [facility] [phone] [address] , [place] [address] Email : [email or web address]

## 2022-04-03 ENCOUNTER — Ambulatory Visit: Payer: Medicare Other | Admitting: Cardiology

## 2022-04-10 NOTE — Telephone Encounter (Signed)
Spoke with pt, he is doing fine with no more chest pain or blood pressure issues. He has a follow up appointment next month. He does not need anything at this time.

## 2022-04-21 ENCOUNTER — Institutional Professional Consult (permissible substitution): Payer: Medicare Other | Admitting: Psychiatry

## 2022-05-02 ENCOUNTER — Other Ambulatory Visit: Payer: Self-pay | Admitting: Cardiology

## 2022-05-04 NOTE — Progress Notes (Signed)
HPI: FU CAD and chronic CP. S/P CABG 2001; multiple PCI since. Abd ultrasound 8/18 showed no aneurysm. ABIs 11/19 normal. Carotid dopplers 2/23 showed 40-59 right and 1-39 left. Vascular disease followed by vascular surgery.  Echocardiogram February 2024 showed normal LV function, moderate left ventricular hypertrophy, mild left atrial enlargement, trace aortic insufficiency.  Cardiac catheterization February 2024 showed 70% ostial left main, occluded LAD, 60% in-stent restenosis proximal circumflex and 60% mid right coronary artery, patent LIMA to the LAD, patent sequential left radial to D1 with jump segment from D1 to OM occluded and mildly elevated LVEDP.  Patient had PCI of the left main.  Since last seen, patient has occasional chest pain "every day" that is unchanged.  He has dyspnea after eating.  He denies syncope.  Current Outpatient Medications  Medication Sig Dispense Refill   amLODipine (NORVASC) 10 MG tablet Take 10 mg by mouth daily.     aspirin EC 81 MG tablet Take 81 mg by mouth at bedtime.      clopidogrel (PLAVIX) 75 MG tablet Take 1 tablet (75 mg total) by mouth daily with breakfast. 90 tablet 1   Evolocumab (REPATHA SURECLICK) 140 MG/ML SOAJ Inject 140 mg into the skin every 14 (fourteen) days. 2 mL 11   ezetimibe (ZETIA) 10 MG tablet Take 1 tablet (10 mg total) by mouth daily. 30 tablet 3   GAS-X EXTRA STRENGTH 125 MG chewable tablet Chew 125 mg by mouth 3 (three) times daily with meals.     isosorbide mononitrate (IMDUR) 30 MG 24 hr tablet TAKE 1 TABLET (30 MG TOTAL) BY MOUTH DAILY. ** DO NOT CRUSH ** 90 tablet 3   LORazepam (ATIVAN) 0.5 MG tablet Take 0.5 mg by mouth daily as needed for anxiety.  2   metoprolol tartrate (LOPRESSOR) 25 MG tablet Take 1 tablet (25 mg total) by mouth 2 (two) times daily. (Patient taking differently: Take 25 mg by mouth 2 (two) times daily. Per patient taking twice a day) 60 tablet 3   nitroGLYCERIN (NITROSTAT) 0.4 MG SL tablet Place 1 tablet  (0.4 mg total) under the tongue every 5 (five) minutes as needed. For chest pain (Patient taking differently: Place 0.4 mg under the tongue every 5 (five) minutes as needed for chest pain.) 25 tablet 11   pantoprazole (PROTONIX) 40 MG tablet Take 1 tablet (40 mg total) by mouth daily. (Patient taking differently: Take 40 mg by mouth 2 (two) times daily before a meal.) 30 tablet 1   PARoxetine (PAXIL) 20 MG tablet Take 20 mg by mouth at bedtime.   5   sucralfate (CARAFATE) 1 g tablet Take 1 g by mouth 3 (three) times daily.     losartan (COZAAR) 50 MG tablet Take 1 tablet (50 mg total) by mouth daily. (Patient not taking: Reported on 03/05/2022) 90 tablet 3   No current facility-administered medications for this visit.     Past Medical History:  Diagnosis Date   Anemia 10/06/2011   Anginal pain    Arthritis    "joints hurt; shoulders, arms, back" (08/13/2013)   Atrial fibrillation    B12 deficiency    Carotid stenosis 04/10/2011   a. s/p left carotid endarterectomy 04/14/2011.;  b.  Carotid US (11/13):  L CEA ok; RICA 1-39%   Chronic bronchitis    "get it q yr"   Chronic chest pain    Chronic lower back pain    Colon polyp    adenomatous   Coronary artery  disease    a. S/p CABG in 2001. b. S/p DES to protected LM and BMS to RCA 2006. c. 12/2009: s/p DES to Cx;  d. 09/2011 Cath: patent stents, patent grafts -->Med Rx.;  e. CP with abnl Nuc => LHC 10/04/12: oLM 30%, dLM stent into the CFX patent, LAD occluded, LIMA-LAD patent, RI 30%, mid AVCFX 30%, oOM1 50%, oRCA stent patent, mRCA 50-60%, Radial graft-Dx patent; EF 55%.=> Med Rx.  f. stent to LM & Circ,   Daily headache    "for the last couple weeks" (08/13/2013)   Diverticulosis    Esophageal stricture    GERD (gastroesophageal reflux disease)    Hemorrhoids    Hiatal hernia    Hyperlipidemia    Hypertension    Left bundle branch block    Memory deficit    Pancreatitis 12/2009   ERCP ok.   Prostate cancer    s/p cryoablation    Renal cyst    Seen on CT 08/2011 also with circumferential bladder wall thickening   Skin cancer    "cut it off my right ear"   Statin intolerance     Past Surgical History:  Procedure Laterality Date   CARDIAC CATHETERIZATION     CHOLECYSTECTOMY     COLONOSCOPY     CORONARY ANGIOPLASTY WITH STENT PLACEMENT     "1 + 1"   CORONARY ARTERY BYPASS GRAFT  2001   CORONARY STENT INTERVENTION N/A 09/11/2017   Procedure: CORONARY STENT INTERVENTION;  Surgeon: Runell Gess, MD;  [facility];  Service: Cardiovascular;  Laterality: N/A;   CORONARY STENT INTERVENTION N/A 03/06/2022   Procedure: CORONARY STENT INTERVENTION;  Surgeon: Yvonne Kendall, MD;  Location: [facility];  Service: Cardiovascular;  Laterality: N/A;   CORONARY STENT PLACEMENT  09/11/2017   Ost Cx to Prox Cx lesion is 95% stenosed.   ENDARTERECTOMY  04/14/2011   Procedure: ENDARTERECTOMY CAROTID;  Surgeon: Pryor Ochoa, MD;  Location: [facility] OR;  Service: Vascular;  Laterality: Left;  Would like to perform procedure first, at 0730   EYE SURGERY     cataract   INGUINAL HERNIA REPAIR Bilateral    LEFT HEART CATH AND CORONARY ANGIOGRAPHY N/A 03/06/2022   Procedure: LEFT HEART CATH AND CORONARY ANGIOGRAPHY;  Surgeon: Yvonne Kendall, MD;  [facility]: [facility];  Service: Cardiovascular;  Laterality: N/A;   LEFT HEART CATH AND CORS/GRAFTS ANGIOGRAPHY N/A 09/11/2017   Procedure: LEFT HEART CATH AND CORS/GRAFTS ANGIOGRAPHY;  Surgeon: Runell Gess, MD;  [facility]: [facility];  Service: Cardiovascular;  Laterality: N/A;   LEFT HEART CATHETERIZATION WITH CORONARY/GRAFT ANGIOGRAM N/A 10/05/2011   Procedure: LEFT HEART CATHETERIZATION WITH Isabel Caprice;  Surgeon: Herby Abraham, MD;  Location[facility] CATH LAB;  Service: Cardiovascular;  Laterality: N/A;   LEFT HEART CATHETERIZATION WITH CORONARY/GRAFT ANGIOGRAM N/A 10/04/2012   Procedure: LEFT HEART CATHETERIZATION WITH Isabel Caprice;  Surgeon: Dolores Patty, MD;  Location: [facility] CATH LAB;  Service: Cardiovascular;  Laterality: N/A;   POLYPECTOMY     PR VEIN BYPASS GRAFT,AORTO-FEM-POP     PROSTATE CRYOABLATION      Social History   Socioeconomic History   Marital status: Widowed    Spouse name: Not on file   Number of children: 2   Years of education: Not on file   Highest education level: Not on file  Occupational History   Occupation: Retired    Comment: raised Programme researcher, broadcasting/film/video  Tobacco Use   Smoking  status: Former    Years: .1    Types: Cigarettes   Smokeless tobacco: Current    Types: Chew   Tobacco comments:    "smoked a few cigarettes; no more than 1 month"  Vaping Use   Vaping Use: Never used  Substance and Sexual Activity   Alcohol use: No    Alcohol/week: 0.0 standard drinks of alcohol    Comment: "no alcohol since I was a teenager"   Drug use: No   Sexual activity: Not Currently  Other Topics Concern   Not on file  Social History Narrative   Patient is illiterate. He cannot read or write. He left school at about the seventh grade.   As of 10/2015 he reports that his wife is chronically ill at home with heart disease and COPD and is under hospice care. The bulk of the care is given by the patient and daughter.   Social Determinants of Health   Financial Resource Strain: Not on file  Food Insecurity: Not on file  Transportation Needs: Not on file  Physical Activity: Not on file  Stress: Not on file  Social Connections: Not on file  Intimate Partner Violence: Not on file    Family History  Problem Relation Age of Onset   Heart disease Mother        Heart Disease before age 73   Hypertension Mother    Heart attack Mother    Lung cancer Father    Hypertension Sister    Heart disease Sister        Heart Disease before age 3   Cancer Sister    Heart attack Sister    Hypertension Brother    Heart disease Brother        Heart Disease before age 25   Heart attack Brother     Colon cancer Neg Hx    Esophageal cancer Neg Hx    Pancreatic cancer Neg Hx    Stomach cancer Neg Hx     ROS: no fevers or chills, productive cough, hemoptysis, dysphasia, odynophagia, melena, hematochezia, dysuria, hematuria, rash, seizure activity, orthopnea, PND, pedal edema, claudication. Remaining systems are negative.  Physical Exam: Well-developed well-nourished in no acute distress.  Skin is warm and dry.  HEENT is normal.  Neck is supple.  Chest is clear to auscultation with normal expansion.  Cardiovascular exam is regular rate and rhythm.  Abdominal exam nontender or distended. No masses palpated. Extremities show no edema. neuro grossly intact  ECG-normal sinus rhythm at a rate of 59, left bundle branch block.  Personally reviewed  A/P  1 coronary artery disease-patient is status post recent PCI of the left main; continue aspirin and Plavix.  He is intolerant to statins.  2 chest pain-patient has had difficulties with chronic chest pain and his symptoms are very difficult to assess.  He continues to have chest pain "every day" but unchanged and not particular bothersome.  We will follow for now.  Note his electrocardiogram is not helpful given baseline left bundle branch block.  3 hypertension-patient's blood pressure is controlled.  Continue present medications.  4 hyperlipidemia-intolerant to statins.  Continue Repatha.  Check lipids and liver.  5 carotid artery disease-we will arrange follow-up carotid Dopplers.  Olga Millers, MD

## 2022-05-16 ENCOUNTER — Encounter: Payer: Self-pay | Admitting: Cardiology

## 2022-05-16 ENCOUNTER — Ambulatory Visit: Payer: Medicare Other | Attending: Cardiology | Admitting: Cardiology

## 2022-05-16 VITALS — BP 122/80 | HR 59 | Ht 62.0 in | Wt 144.0 lb

## 2022-05-16 DIAGNOSIS — Z9861 Coronary angioplasty status: Secondary | ICD-10-CM | POA: Insufficient documentation

## 2022-05-16 DIAGNOSIS — I6523 Occlusion and stenosis of bilateral carotid arteries: Secondary | ICD-10-CM | POA: Diagnosis not present

## 2022-05-16 DIAGNOSIS — E785 Hyperlipidemia, unspecified: Secondary | ICD-10-CM | POA: Diagnosis not present

## 2022-05-16 DIAGNOSIS — I10 Essential (primary) hypertension: Secondary | ICD-10-CM

## 2022-05-16 DIAGNOSIS — I2510 Atherosclerotic heart disease of native coronary artery without angina pectoris: Secondary | ICD-10-CM | POA: Diagnosis not present

## 2022-05-16 NOTE — Patient Instructions (Addendum)
Your physician has requested that you have a carotid duplex. This test is an ultrasound of the carotid arteries in your neck. It looks at blood flow through these arteries that supply the brain with blood. Allow one hour for this exam. There are no restrictions or special instructions. [facility]   Follow-Up: At [facility], you and your health needs are our priority.  As part of our continuing mission to provide you with exceptional heart care, we have created designated Provider Care Teams.  These Care Teams include your primary Cardiologist (physician) and Advanced Practice Providers (APPs -  Physician Assistants and Nurse Practitioners) who all work together to provide you with the care you need, when you need it.  We recommend signing up for the patient portal called "MyChart".  Sign up information is provided on this After Visit Summary.  MyChart is used to connect with patients for Virtual Visits (Telemedicine).  Patients are able to view lab/test results, encounter notes, upcoming appointments, etc.  Non-urgent messages can be sent to your provider as well.   To learn more about what you can do with MyChart, go to [email or web address].    Your next appointment:   6 month(s)  Provider:   ANY APP   Then, Olga Millers, MD will plan to see you again in 12 month(s).

## 2022-06-05 ENCOUNTER — Ambulatory Visit (HOSPITAL_COMMUNITY)
Admission: RE | Admit: 2022-06-05 | Discharge: 2022-06-05 | Disposition: A | Discharge status: Home / Self Care | Payer: Medicare Other | Source: Ambulatory Visit | Attending: Internal Medicine | Admitting: Internal Medicine

## 2022-06-05 DIAGNOSIS — I6523 Occlusion and stenosis of bilateral carotid arteries: Secondary | ICD-10-CM | POA: Diagnosis not present

## 2022-06-08 ENCOUNTER — Encounter: Payer: Self-pay | Admitting: *Deleted

## 2022-06-12 DIAGNOSIS — H40023 Open angle with borderline findings, high risk, bilateral: Secondary | ICD-10-CM | POA: Diagnosis not present

## 2022-06-12 DIAGNOSIS — Z9842 Cataract extraction status, left eye: Secondary | ICD-10-CM | POA: Diagnosis not present

## 2022-06-12 DIAGNOSIS — H52223 Regular astigmatism, bilateral: Secondary | ICD-10-CM | POA: Diagnosis not present

## 2022-06-12 DIAGNOSIS — Z9841 Cataract extraction status, right eye: Secondary | ICD-10-CM | POA: Diagnosis not present

## 2022-06-21 DIAGNOSIS — H6692 Otitis media, unspecified, left ear: Secondary | ICD-10-CM | POA: Diagnosis not present

## 2022-07-18 DIAGNOSIS — Z9181 History of falling: Secondary | ICD-10-CM | POA: Diagnosis not present

## 2022-07-18 DIAGNOSIS — G8929 Other chronic pain: Secondary | ICD-10-CM | POA: Diagnosis not present

## 2022-07-18 DIAGNOSIS — N1831 Chronic kidney disease, stage 3a: Secondary | ICD-10-CM | POA: Diagnosis not present

## 2022-07-18 DIAGNOSIS — F419 Anxiety disorder, unspecified: Secondary | ICD-10-CM | POA: Diagnosis not present

## 2022-07-18 DIAGNOSIS — I25119 Atherosclerotic heart disease of native coronary artery with unspecified angina pectoris: Secondary | ICD-10-CM | POA: Diagnosis not present

## 2022-07-18 DIAGNOSIS — Z1331 Encounter for screening for depression: Secondary | ICD-10-CM | POA: Diagnosis not present

## 2022-07-18 DIAGNOSIS — I10 Essential (primary) hypertension: Secondary | ICD-10-CM | POA: Diagnosis not present

## 2022-07-18 DIAGNOSIS — I679 Cerebrovascular disease, unspecified: Secondary | ICD-10-CM | POA: Diagnosis not present

## 2022-07-18 DIAGNOSIS — I739 Peripheral vascular disease, unspecified: Secondary | ICD-10-CM | POA: Diagnosis not present

## 2022-07-18 DIAGNOSIS — Z139 Encounter for screening, unspecified: Secondary | ICD-10-CM | POA: Diagnosis not present

## 2022-07-18 DIAGNOSIS — R109 Unspecified abdominal pain: Secondary | ICD-10-CM | POA: Diagnosis not present

## 2022-07-18 DIAGNOSIS — E7800 Pure hypercholesterolemia, unspecified: Secondary | ICD-10-CM | POA: Diagnosis not present

## 2022-08-07 ENCOUNTER — Telehealth: Payer: Self-pay | Admitting: Cardiology

## 2022-08-07 ENCOUNTER — Other Ambulatory Visit: Payer: Self-pay | Admitting: *Deleted

## 2022-08-07 DIAGNOSIS — I10 Essential (primary) hypertension: Secondary | ICD-10-CM

## 2022-08-07 MED ORDER — LOSARTAN POTASSIUM 50 MG PO TABS: 50.0000 mg | ORAL_TABLET | Freq: Every day | ORAL | 1 refills | Status: DC

## 2022-08-07 NOTE — Telephone Encounter (Signed)
*  STAT* If patient is at the pharmacy, call can be transferred to refill team.   1. Which medications need to be refilled? (please list name of each medication and dose if known)   losartan (COZAAR) 50 MG tablet (Expired)    2. Which pharmacy/location (including street and city if local pharmacy) is medication to be sent to? [id number] - [place], [place] - [address]    3. Do they need a 30 day or 90 day supply? 90 day

## 2022-08-10 DIAGNOSIS — E538 Deficiency of other specified B group vitamins: Secondary | ICD-10-CM | POA: Diagnosis not present

## 2022-08-10 DIAGNOSIS — N1831 Chronic kidney disease, stage 3a: Secondary | ICD-10-CM | POA: Diagnosis not present

## 2022-08-10 DIAGNOSIS — D649 Anemia, unspecified: Secondary | ICD-10-CM | POA: Diagnosis not present

## 2022-09-14 ENCOUNTER — Other Ambulatory Visit: Payer: Self-pay

## 2022-09-14 DIAGNOSIS — D509 Iron deficiency anemia, unspecified: Secondary | ICD-10-CM | POA: Diagnosis not present

## 2022-09-14 DIAGNOSIS — I6523 Occlusion and stenosis of bilateral carotid arteries: Secondary | ICD-10-CM

## 2022-09-22 ENCOUNTER — Other Ambulatory Visit: Payer: Self-pay | Admitting: General Practice

## 2022-09-27 ENCOUNTER — Ambulatory Visit (HOSPITAL_COMMUNITY)
Admission: RE | Admit: 2022-09-27 | Discharge: 2022-09-27 | Disposition: A | Discharge status: Home / Self Care | Payer: Medicare Other | Source: Ambulatory Visit | Attending: Vascular Surgery | Admitting: Vascular Surgery

## 2022-09-27 ENCOUNTER — Ambulatory Visit (INDEPENDENT_AMBULATORY_CARE_PROVIDER_SITE_OTHER): Payer: Medicare Other | Admitting: Physician Assistant

## 2022-09-27 VITALS — BP 180/74 | HR 53 | Temp 97.4°F | Resp 18 | Ht 62.0 in | Wt 144.2 lb

## 2022-09-27 DIAGNOSIS — I6523 Occlusion and stenosis of bilateral carotid arteries: Secondary | ICD-10-CM

## 2022-09-27 NOTE — Progress Notes (Signed)
Office Note     CC:  follow up Requesting Provider:  Lonie Peak, PA-C  HPI: [name] [name] is a 82 y.o. ([birth year]) male who presents for surveillance of carotid artery stenosis.  He underwent left carotid endarterectomy for symptomatic carotid stenosis in 2013 by Dr. Hart Rochester.  He denies any strokelike symptoms including slurring speech, changes in vision, or one-sided weakness.  He does have dizziness however states this is usually when he is going from sitting to standing or from laying down to sitting.  He is taking his aspirin and Plavix daily.  He recently stopped Repatha due to the out-of-pocket cost.  Since last office visit 1 year ago he has also had coronary artery stenting due to unstable angina.  He states he no longer has the chest pain he experienced prior to the stenting.  His granddaughter is here with him today.   Past Medical History:  Diagnosis Date   Anemia 10/06/2011   Anginal pain (HCC)    Arthritis    "joints hurt; shoulders, arms, back" (08/13/2013)   Atrial fibrillation (HCC)    B12 deficiency    Carotid stenosis 04/10/2011   a. s/p left carotid endarterectomy 04/14/2011.;  b.  Carotid US (11/13):  L CEA ok; RICA 1-39%   Chronic bronchitis (HCC)    "get it q yr"   Chronic chest pain    Chronic lower back pain    Colon polyp    adenomatous   Coronary artery disease    a. S/p CABG in 2001. b. S/p DES to protected LM and BMS to RCA 2006. c. 12/2009: s/p DES to Cx;  d. 09/2011 Cath: patent stents, patent grafts -->Med Rx.;  e. CP with abnl Nuc => LHC 10/04/12: oLM 30%, dLM stent into the CFX patent, LAD occluded, LIMA-LAD patent, RI 30%, mid AVCFX 30%, oOM1 50%, oRCA stent patent, mRCA 50-60%, Radial graft-Dx patent; EF 55%.=> Med Rx.  f. stent to LM & Circ,   Daily headache    "for the last couple weeks" (08/13/2013)   Diverticulosis    Esophageal stricture    GERD (gastroesophageal reflux disease)    Hemorrhoids    Hiatal hernia    Hyperlipidemia     Hypertension    Left bundle branch block    Memory deficit    Pancreatitis 12/2009   ERCP ok.   Prostate cancer [facility])    s/p cryoablation   Renal cyst    Seen on CT 08/2011 also with circumferential bladder wall thickening   Skin cancer    "cut it off my right ear"   Statin intolerance     Past Surgical History:  Procedure Laterality Date   CARDIAC CATHETERIZATION     CHOLECYSTECTOMY     COLONOSCOPY     CORONARY ANGIOPLASTY WITH STENT PLACEMENT     "1 + 1"   CORONARY ARTERY BYPASS GRAFT  2001   CORONARY STENT INTERVENTION N/A 09/11/2017   Procedure: CORONARY STENT INTERVENTION;  Surgeon: Runell Gess, MD;  [facility];  Service: Cardiovascular;  Laterality: N/A;   CORONARY STENT INTERVENTION N/A 03/06/2022   Procedure: CORONARY STENT INTERVENTION;  Surgeon: Yvonne Kendall, MD;  [facility];  Service: Cardiovascular;  Laterality: N/A;   CORONARY STENT PLACEMENT  09/11/2017   Ost Cx to Prox Cx lesion is 95% stenosed.   ENDARTERECTOMY  04/14/2011   Procedure: ENDARTERECTOMY CAROTID;  Surgeon: Pryor Ochoa, MD;  Location: [facility] OR;  Service: Vascular;  Laterality: Left;  Would like to perform procedure first, at 0730   EYE SURGERY     cataract   INGUINAL HERNIA REPAIR Bilateral    LEFT HEART CATH AND CORONARY ANGIOGRAPHY N/A 03/06/2022   Procedure: LEFT HEART CATH AND CORONARY ANGIOGRAPHY;  Surgeon: Yvonne Kendall, MD;  [facility];  Service: Cardiovascular;  Laterality: N/A;   LEFT HEART CATH AND CORS/GRAFTS ANGIOGRAPHY N/A 09/11/2017   Procedure: LEFT HEART CATH AND CORS/GRAFTS ANGIOGRAPHY;  Surgeon: Runell Gess, MD;  [facility];  Service: Cardiovascular;  Laterality: N/A;   LEFT HEART CATHETERIZATION WITH CORONARY/GRAFT ANGIOGRAM N/A 10/05/2011   Procedure: LEFT HEART CATHETERIZATION WITH Isabel Caprice;  Surgeon: Herby Abraham, MD;  Location: [facility] CATH LAB;  Service: Cardiovascular;   Laterality: N/A;   LEFT HEART CATHETERIZATION WITH CORONARY/GRAFT ANGIOGRAM N/A 10/04/2012   Procedure: LEFT HEART CATHETERIZATION WITH Isabel Caprice;  Surgeon: Dolores Patty, MD;  Location: [facility] CATH LAB;  Service: Cardiovascular;  Laterality: N/A;   POLYPECTOMY     PR VEIN BYPASS GRAFT,AORTO-FEM-POP     PROSTATE CRYOABLATION      Social History   Socioeconomic History   Marital status: Widowed    Spouse name: Not on file   Number of children: 2   Years of education: Not on file   Highest education level: Not on file  Occupational History   Occupation: Retired    Comment: raised Programme researcher, broadcasting/film/video  Tobacco Use   Smoking status: Former    Types: Cigarettes   Smokeless tobacco: Current    Types: Chew   Tobacco comments:    "smoked a few cigarettes; no more than 1 month"  Vaping Use   Vaping status: Never Used  Substance and Sexual Activity   Alcohol use: No    Alcohol/week: 0.0 standard drinks of alcohol    Comment: "no alcohol since I was a teenager"   Drug use: No   Sexual activity: Not Currently  Other Topics Concern   Not on file  Social History Narrative   Patient is illiterate. He cannot read or write. He left school at about the seventh grade.   As of 10/2015 he reports that his wife is chronically ill at home with heart disease and COPD and is under hospice care. The bulk of the care is given by the patient and daughter.   Social Determinants of Health   Financial Resource Strain: Not on file  Food Insecurity: Not on file  Transportation Needs: Not on file  Physical Activity: Not on file  Stress: Not on file  Social Connections: Not on file  Intimate Partner Violence: Not on file    Family History  Problem Relation Age of Onset   Heart disease Mother        Heart Disease before age 51   Hypertension Mother    Heart attack Mother    Lung cancer Father    Hypertension Sister    Heart disease Sister        Heart Disease before age 75   Cancer Sister     Heart attack Sister    Hypertension Brother    Heart disease Brother        Heart Disease before age 64   Heart attack Brother    Colon cancer Neg Hx    Esophageal cancer Neg Hx    Pancreatic cancer Neg Hx    Stomach cancer Neg Hx     Current Outpatient Medications  Medication Sig  Dispense Refill   amLODipine (NORVASC) 10 MG tablet Take 10 mg by mouth daily.     aspirin EC 81 MG tablet Take 81 mg by mouth at bedtime.      clopidogrel (PLAVIX) 75 MG tablet TAKE 1 TABLET BY MOUTH DAILY WITH BREAKFAST. 90 tablet 1   Evolocumab (REPATHA SURECLICK) 140 MG/ML SOAJ Inject 140 mg into the skin every 14 (fourteen) days. 2 mL 11   ezetimibe (ZETIA) 10 MG tablet Take 1 tablet (10 mg total) by mouth daily. 30 tablet 3   GAS-X EXTRA STRENGTH 125 MG chewable tablet Chew 125 mg by mouth 3 (three) times daily with meals.     isosorbide mononitrate (IMDUR) 30 MG 24 hr tablet TAKE 1 TABLET (30 MG TOTAL) BY MOUTH DAILY. ** DO NOT CRUSH ** 90 tablet 3   LORazepam (ATIVAN) 0.5 MG tablet Take 0.5 mg by mouth daily as needed for anxiety.  2   losartan (COZAAR) 50 MG tablet Take 1 tablet (50 mg total) by mouth daily. 90 tablet 1   metoprolol tartrate (LOPRESSOR) 25 MG tablet Take 1 tablet (25 mg total) by mouth 2 (two) times daily. (Patient taking differently: Take 25 mg by mouth 2 (two) times daily. Per patient taking twice a day) 60 tablet 3   nitroGLYCERIN (NITROSTAT) 0.4 MG SL tablet Place 1 tablet (0.4 mg total) under the tongue every 5 (five) minutes as needed. For chest pain (Patient taking differently: Place 0.4 mg under the tongue every 5 (five) minutes as needed for chest pain.) 25 tablet 11   pantoprazole (PROTONIX) 40 MG tablet Take 1 tablet (40 mg total) by mouth daily. (Patient taking differently: Take 40 mg by mouth 2 (two) times daily before a meal.) 30 tablet 1   PARoxetine (PAXIL) 20 MG tablet Take 20 mg by mouth at bedtime.   5   sucralfate (CARAFATE) 1 g tablet Take 1 g by mouth 3 (three)  times daily.     No current facility-administered medications for this visit.    Allergies  Allergen Reactions   Shellfish Allergy Anaphylaxis   Zolpidem Tartrate Other (See Comments)    Hallucinations    Zolpidem Tartrate Other (See Comments)    Hallucinations   Brilinta [Ticagrelor] Other (See Comments)    Reaction not recalled   Amoxicillin-Pot Clavulanate Other (See Comments)    "Burns' Stomach"   Atorvastatin Other (See Comments)    "Made the legs hurt"   Codeine Nausea And Vomiting   Erythromycin Diarrhea, Nausea And Vomiting and Other (See Comments)    All -mycins cause upset stomach   Hydrochlorothiazide Other (See Comments)    Reaction not recalled   Hydrocodone Nausea And Vomiting   Morphine Nausea And Vomiting   Nitrofuran Derivatives Other (See Comments)    Reaction niot recalled   Oxycodone Hcl Nausea And Vomiting   Tramadol Nausea And Vomiting   Zocor [Simvastatin] Other (See Comments)    Muscle pain/soreness     REVIEW OF SYSTEMS:   [X]  denotes positive finding, [ ]  denotes negative finding Cardiac  Comments:  Chest pain or chest pressure:    Shortness of breath upon exertion:    Short of breath when lying flat:    Irregular heart rhythm:        Vascular    Pain in calf, thigh, or hip brought on by ambulation:    Pain in feet at night that wakes you up from your sleep:     Blood clot in your  veins:    Leg swelling:         Pulmonary    Oxygen at home:    Productive cough:     Wheezing:         Neurologic    Sudden weakness in arms or legs:     Sudden numbness in arms or legs:     Sudden onset of difficulty speaking or slurred speech:    Temporary loss of vision in one eye:     Problems with dizziness:         Gastrointestinal    Blood in stool:     Vomited blood:         Genitourinary    Burning when urinating:     Blood in urine:        Psychiatric    Major depression:         Hematologic    Bleeding problems:    Problems with  blood clotting too easily:        Skin    Rashes or ulcers:        Constitutional    Fever or chills:      PHYSICAL EXAMINATION:  Vitals:   09/27/22 0819 09/27/22 0820  BP: (!) 171/79 (!) 180/74  Pulse: (!) 53   Resp: 18   Temp: (!) 97.4 F (36.3 C)   TempSrc: Temporal   SpO2: 98%   Weight: 144 lb 3.2 oz (65.4 kg)   Height: 5\' 2"  (1.575 m)     General:  WDWN in NAD; vital signs documented above Gait: Not observed HENT: WNL, normocephalic Pulmonary: normal non-labored breathing , without Rales, rhonchi,  wheezing Cardiac: regular HR Abdomen: soft, NT, no masses Skin: without rashes Vascular Exam/Pulses: palpable radial pulses Extremities: without ischemic changes, without Gangrene , without cellulitis; without open wounds;  Musculoskeletal: no muscle wasting or atrophy  Neurologic: A&O X 3; CN grossly intact Psychiatric:  The pt has Normal affect.   Non-Invasive Vascular Imaging:   Right ICA 40 to 59% stenosis Left ICA 1 to 39% stenosis    ASSESSMENT/PLAN:: 82 y.o. male here for follow up for carotid artery surveillance  -Subjectively the patient is not experiencing neurological events since last office visit.  He does have dizziness however states this usually occurs when he goes from laying down to sitting or from sitting to standing.  We discussed performing these transitions slowly to limit fall risk.  He states his cardiologist and PCP are aware of the symptoms as well.  Carotid duplex is unchanged from 1 year ago.  Right ICA stenosis estimated to be 40 to 59% and left ICA is 1-39% with prior endarterectomy on the left.  He will continue his Plavix and aspirin daily.  We will repeat carotid duplex in 1 year per protocol.   Emilie Rutter, PA-C [facility] [phone]  Clinic MD:   Randie Heinz

## 2022-10-10 ENCOUNTER — Other Ambulatory Visit: Payer: Self-pay

## 2022-10-10 DIAGNOSIS — I6523 Occlusion and stenosis of bilateral carotid arteries: Secondary | ICD-10-CM

## 2022-12-12 NOTE — Progress Notes (Unsigned)
Cardiology Clinic Note   Patient Name: [name] [name] Date of Encounter: 12/14/2022  Primary Care Provider:  Lonie Peak, PA-C Primary Cardiologist:  Olga Millers, MD  Patient Profile    [name] [name] 82 year old male presents to the clinic today for follow-up evaluation of his coronary artery disease and essential hypertension.  Past Medical History    Past Medical History:  Diagnosis Date   Anemia 10/06/2011   Anginal pain (HCC)    Arthritis    "joints hurt; shoulders, arms, back" (08/13/2013)   Atrial fibrillation (HCC)    B12 deficiency    Carotid stenosis 04/10/2011   a. s/p left carotid endarterectomy 04/14/2011.;  b.  Carotid US (11/13):  L CEA ok; RICA 1-39%   Chronic bronchitis (HCC)    "get it q yr"   Chronic chest pain    Chronic lower back pain    Colon polyp    adenomatous   Coronary artery disease    a. S/p CABG in 2001. b. S/p DES to protected LM and BMS to RCA 2006. c. 12/2009: s/p DES to Cx;  d. 09/2011 Cath: patent stents, patent grafts -->Med Rx.;  e. CP with abnl Nuc => LHC 10/04/12: oLM 30%, dLM stent into the CFX patent, LAD occluded, LIMA-LAD patent, RI 30%, mid AVCFX 30%, oOM1 50%, oRCA stent patent, mRCA 50-60%, Radial graft-Dx patent; EF 55%.=> Med Rx.  f. stent to LM & Circ,   Daily headache    "for the last couple weeks" (08/13/2013)   Diverticulosis    Esophageal stricture    GERD (gastroesophageal reflux disease)    Hemorrhoids    Hiatal hernia    Hyperlipidemia    Hypertension    Left bundle branch block    Memory deficit    Pancreatitis 12/2009   ERCP ok.   Prostate cancer [facility])    s/p cryoablation   Renal cyst    Seen on CT 08/2011 also with circumferential bladder wall thickening   Skin cancer    "cut it off my right ear"   Statin intolerance    Past Surgical History:  Procedure Laterality Date   CARDIAC CATHETERIZATION     CHOLECYSTECTOMY     COLONOSCOPY     CORONARY ANGIOPLASTY WITH STENT PLACEMENT     "1 + 1"    CORONARY ARTERY BYPASS GRAFT  2001   CORONARY STENT INTERVENTION N/A 09/11/2017   Procedure: CORONARY STENT INTERVENTION;  Surgeon: Runell Gess, MD;  Location: [facility] INVASIVE CV LAB;  Service: Cardiovascular;  Laterality: N/A;   CORONARY STENT INTERVENTION N/A 03/06/2022   Procedure: CORONARY STENT INTERVENTION;  Surgeon: Yvonne Kendall, MD;  Location: [facility] INVASIVE CV LAB;  Service: Cardiovascular;  Laterality: N/A;   CORONARY STENT PLACEMENT  09/11/2017   Ost Cx to Prox Cx lesion is 95% stenosed.   ENDARTERECTOMY  04/14/2011   Procedure: ENDARTERECTOMY CAROTID;  Surgeon: Pryor Ochoa, MD;  Location: [facility] OR;  Service: Vascular;  Laterality: Left;  Would like to perform procedure first, at 0730   EYE SURGERY     cataract   INGUINAL HERNIA REPAIR Bilateral    LEFT HEART CATH AND CORONARY ANGIOGRAPHY N/A 03/06/2022   Procedure: LEFT HEART CATH AND CORONARY ANGIOGRAPHY;  Surgeon: Yvonne Kendall, MD;  Location: [facility] CV LAB;  Service: Cardiovascular;  Laterality: N/A;   LEFT HEART CATH AND CORS/GRAFTS ANGIOGRAPHY N/A 09/11/2017   Procedure: LEFT HEART CATH AND CORS/GRAFTS ANGIOGRAPHY;  Surgeon: Runell Gess, MD;  Location: [facility]  INVASIVE CV LAB;  Service: Cardiovascular;  Laterality: N/A;   LEFT HEART CATHETERIZATION WITH CORONARY/GRAFT ANGIOGRAM N/A 10/05/2011   Procedure: LEFT HEART CATHETERIZATION WITH Isabel Caprice;  Surgeon: Herby Abraham, MD;  Location: [facility] CATH LAB;  Service: Cardiovascular;  Laterality: N/A;   LEFT HEART CATHETERIZATION WITH CORONARY/GRAFT ANGIOGRAM N/A 10/04/2012   Procedure: LEFT HEART CATHETERIZATION WITH Isabel Caprice;  Surgeon: Dolores Patty, MD;  Location: [facility] CATH LAB;  Service: Cardiovascular;  Laterality: N/A;   POLYPECTOMY     PR VEIN BYPASS GRAFT,AORTO-FEM-POP     PROSTATE CRYOABLATION      Allergies  Allergies  Allergen Reactions   Shellfish Allergy Anaphylaxis   Zolpidem Tartrate Other (See Comments)     Hallucinations    Zolpidem Tartrate Other (See Comments)    Hallucinations   Brilinta [Ticagrelor] Other (See Comments)    Reaction not recalled   Amoxicillin-Pot Clavulanate Other (See Comments)    "Burns' Stomach"   Atorvastatin Other (See Comments)    "Made the legs hurt"   Codeine Nausea And Vomiting   Erythromycin Diarrhea, Nausea And Vomiting and Other (See Comments)    All -mycins cause upset stomach   Hydrochlorothiazide Other (See Comments)    Reaction not recalled   Hydrocodone Nausea And Vomiting   Morphine Nausea And Vomiting   Nitrofuran Derivatives Other (See Comments)    Reaction niot recalled   Oxycodone Hcl Nausea And Vomiting   Tramadol Nausea And Vomiting   Zocor [Simvastatin] Other (See Comments)    Muscle pain/soreness    History of Present Illness    [name] [name] has a PMH of essential hypertension, LBBB, carotid stenosis, coronary artery disease status post PCA, CABG 2001, GERD, AKI, chronic renal disease stage III, anxiety, memory loss, and hyperlipidemia.  He has had multiple PCI's since his CABG.  Abdominal ultrasound 8/18 showed no aneurysm.  Cardiac catheterization 8/19 showed 60% RCA, 100% LAD, 95% left main, 95% circumflex, patent radial graft-ramus, patent LIMA-LAD, underwent PCI of his left main and circumflex.  Echocardiogram 8/19 showed normal LV function, G2 DD, mild left atrial enlargement, trace aortic insufficiency, and mitral valve regurgitation.  His ABIs 11/19 were normal.  His carotid Dopplers 12/20 showed 40-59% right ICA stenosis, and 1-39% left ICA stenosis.  His vascular disease is followed by vascular surgery.  He was seen by Dr. Jens Som on 12/01/2019.  During that time he denied dyspnea on exertion, orthopnea, lower extremity swelling, PND, and syncope.  He did occasionally note chest discomfort after eating.  He denied exertional chest discomfort.  He presented to the clinic 04/04/2021.   He continued to be very physically active  working on [employer].   He  noticed some increased work of breathing with increased physical activity but denies increased work of breathing at rest.  We reviewed his previous echocardiogram.   He stated that he was unable to afford Repatha.  I gave him the patient assistance paperwork,and planned  follow-up in 1 year.  He presented to the emergency department on 03/04/2022.  He reported that he had been eating breakfast and developed chest pain with radiation to his neck ears and had.  He had shortness of breath nauseousness and diaphoresis.  He noted that the pain was similar to the pain he had during his stress testing in 2014 before prior interventions.  He rated his discomfort at 9 out of 10.  He noted that after an hour the pressure started to ease off.  He presented to the  emergency department.  His blood pressure was noted to be 124/59.  He was seen and evaluated by Dr. Bing Matter and it was recommended that he be transferred to [facility] for cardiac catheterization.  He underwent left heart cath on 03/06/2022.  He was noted to have multivessel CAD with 70% ostial LMCA stenosis, CTO ostial LAD, 60% in-stent restenosis proximal/mid circumflex, and 60% proximal/mid RCA.  He received PCI with DES to his LMCA and was placed on dual antiplatelet therapy.  He presented to the clinic 03/20/22 for follow-up evaluation and stated he felt well.  He presented with his daughter.  He reported that he stopped his Repatha when he fell in the donut hole at the end of last year.  We reviewed the importance of maintaining low-cholesterol.  We reviewed his cardiac catheterization and he expressed understanding.  He reported some occasional epigastric discomfort after eating.  He had relief of symptoms with Carafate and was following with GI.  I restarted his Repatha, had him increase his physical activity as tolerated and planned follow-up in 3 to 4 months.  He was seen in follow-up by Dr. Jens Som on 05/16/2022.   During that time he reported occasional chest pain daily that was unchanged.  He reported dyspnea after eating.  He denied syncopal events.  He reported that his chest discomfort was not particularly bothersome.  Continued monitoring was recommended.  He presents to the clinic today for follow-up evaluation and states he continues to note some trouble with his breathing after eating large meals.  He denies chest discomfort.  His major complaint today is with his left hip.  It appears he has some sciatica.  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  He enjoys continue to work on [employer].  When he sits down he notices pain in his left hip.  We reviewed his catheterization.  He and his daughter expressed understanding.  He plans to have his blood work drawn at his PCP in January.  I will give exercises for sciatica and plan follow-up in 6 months..  Today he denies chest pain, shortness of breath, lower extremity edema, fatigue, palpitations, melena, hematuria, hemoptysis, diaphoresis, weakness, presyncope, syncope, orthopnea, and PND.    Home Medications    Prior to Admission medications   Medication Sig Start Date End Date Taking? Authorizing Provider  amLODipine (NORVASC) 10 MG tablet Take 10 mg by mouth daily.    [provider]  aspirin EC 81 MG tablet Take 81 mg by mouth at bedtime.     [provider]  Evolocumab (REPATHA SURECLICK) 140 MG/ML SOAJ Inject 140 mg into the skin every 14 (fourteen) days. Patient not taking: Reported on 03/21/2021 01/21/20   Lewayne Bunting, MD  famotidine (PEPCID) 20 MG tablet Take 1 tablet every day before dinner. 03/21/21   Arnaldo Natal, NP  isosorbide mononitrate (IMDUR) 30 MG 24 hr tablet TAKE 1 TABLET (30 MG TOTAL) BY MOUTH DAILY. ** DO NOT CRUSH ** 02/18/21   Lewayne Bunting, MD  LORazepam (ATIVAN) 0.5 MG tablet Take 0.5 mg by mouth daily as needed for anxiety. 07/17/16   [provider]   losartan (COZAAR) 50 MG tablet Take 1 tablet (50 mg total) by mouth daily. Patient taking differently: Take 50 mg by mouth as needed. 12/01/19 03/21/21  Lewayne Bunting, MD  metoCLOPramide (REGLAN) 10 MG tablet Take 10 mg by mouth daily as needed for nausea.  [provider]  metoprolol tartrate (LOPRESSOR) 50 MG tablet Take 50 mg by mouth 2 (two) times daily. 04/06/19   [provider]  nitroGLYCERIN (NITROSTAT) 0.4 MG SL tablet Place 1 tablet (0.4 mg total) under the tongue every 5 (five) minutes as needed. For chest pain Patient not taking: Reported on 03/21/2021 09/13/17   Lewayne Bunting, MD  pantoprazole (PROTONIX) 40 MG tablet Take 1 tablet (40 mg total) by mouth daily. 09/12/17   Berton Bon, NP  PARoxetine (PAXIL) 20 MG tablet Take 20 mg by mouth at bedtime.  04/18/17   [provider]    Family History    Family History  Problem Relation Age of Onset   Heart disease Mother        Heart Disease before age 76   Hypertension Mother    Heart attack Mother    Lung cancer Father    Hypertension Sister    Heart disease Sister        Heart Disease before age 73   Cancer Sister    Heart attack Sister    Hypertension Brother    Heart disease Brother        Heart Disease before age 84   Heart attack Brother    Colon cancer Neg Hx    Esophageal cancer Neg Hx    Pancreatic cancer Neg Hx    Stomach cancer Neg Hx    He indicated that his mother is deceased. He indicated that his father is deceased. He indicated that his sister is deceased. He indicated that his brother is deceased. He indicated that his maternal grandmother is deceased. He indicated that his maternal grandfather is deceased. He indicated that his paternal grandmother is deceased. He indicated that his paternal grandfather is deceased. He indicated that his daughter is alive. He indicated that the status of his neg hx is unknown.  Social History    Social History   Socioeconomic  History   Marital status: Widowed    Spouse name: Not on file   Number of children: 2   Years of education: Not on file   Highest education level: Not on file  Occupational History   Occupation: Retired    Comment: raised Programme researcher, broadcasting/film/video  Tobacco Use   Smoking status: Former    Types: Cigarettes   Smokeless tobacco: Current    Types: Chew   Tobacco comments:    "smoked a few cigarettes; no more than 1 month"  Vaping Use   Vaping status: Never Used  Substance and Sexual Activity   Alcohol use: No    Alcohol/week: 0.0 standard drinks of alcohol    Comment: "no alcohol since I was a teenager"   Drug use: No   Sexual activity: Not Currently  Other Topics Concern   Not on file  Social History Narrative   Patient is illiterate. He cannot read or write. He left school at about the seventh grade.   As of 10/2015 he reports that his wife is chronically ill at home with heart disease and COPD and is under hospice care. The bulk of the care is given by the patient and daughter.   Social Determinants of Health   Financial Resource Strain: Not on file  Food Insecurity: Not on file  Transportation Needs: Not on file  Physical Activity: Not on file  Stress: Not on file  Social Connections: Not on file  Intimate Partner Violence: Not on file     Review of Systems  General:  No chills, fever, night sweats or weight changes.  Cardiovascular:  No chest pain, dyspnea on exertion, edema, orthopnea, palpitations, paroxysmal nocturnal dyspnea. Dermatological: No rash, lesions/masses Respiratory: No cough, dyspnea Urologic: No hematuria, dysuria Abdominal:   No nausea, vomiting, diarrhea, bright red blood per rectum, melena, or hematemesis Neurologic:  No visual changes, wkns, changes in mental status. All other systems reviewed and are otherwise negative except as noted above.  Physical Exam    VS:  BP 136/60   Pulse (!) 59   Ht 5\' 2"  (1.575 m)   Wt 143 lb (64.9 kg)   SpO2 97%   BMI  26.16 kg/m  , BMI Body mass index is 26.16 kg/m. GEN: Well nourished, well developed, in no acute distress. HEENT: normal. Neck: Supple, no JVD, carotid bruits, or masses. Cardiac: RRR, no murmurs, rubs, or gallops. No clubbing, cyanosis, edema.  Radials/DP/PT 2+ and equal bilaterally.  Respiratory:  Respirations regular and unlabored, clear to auscultation bilaterally. GI: Soft, nontender, nondistended, BS + x 4. MS: no deformity or atrophy. Skin: warm and dry, no rash.  Right radial cath site healing well no signs of infection Neuro:  Strength and sensation are intact. Psych: Normal affect.  Accessory Clinical Findings    Recent Labs: 03/05/2022: ALT 16; Magnesium 2.0; TSH 2.230 03/07/2022: BUN 19; Creatinine, Ser 1.32; Hemoglobin 11.2; Platelets 160; Potassium 3.8; Sodium 137   Recent Lipid Panel    Component Value Date/Time   CHOL 284 (H) 03/05/2022 0457   CHOL 167 01/31/2018 1154   TRIG 207 (H) 03/05/2022 0457   HDL 40 (L) 03/05/2022 0457   HDL 39 (L) 01/31/2018 1154   CHOLHDL 7.1 03/05/2022 0457   VLDL 41 (H) 03/05/2022 0457   LDLCALC 203 (H) 03/05/2022 0457   LDLCALC 106 (H) 01/31/2018 1154   LDLDIRECT 178.8 04/02/2007 0906    ECG personally reviewed by me today-EKG Interpretation Date/Time:  Thursday December 14 2022 14:56:33 EST Ventricular Rate:  59 PR Interval:  164 QRS Duration:  142 QT Interval:  474 QTC Calculation: 469 R Axis:   -54  Text Interpretation: Sinus bradycardia Left axis deviation Left bundle branch block When compared with ECG of 07-Mar-2022 06:38, No significant change was found Confirmed by Edd Fabian [phone]) on 12/14/2022 3:06:57 PM   EKG 03/20/2022 sinus bradycardia left deviation left bundle branch block 55 bpm  EKG 04/04/2021 sinus bradycardia left axis deviation left bundle branch block 55 bpm- No acute changes  Echocardiogram 09/10/2017  Study Conclusions   - Left ventricle: The cavity size was normal. There was moderate     concentric hypertrophy. Systolic function was normal. The    estimated ejection fraction was in the range of 60% to 65%. Wall    motion was normal; there were no regional wall motion    abnormalities. Features are consistent with a pseudonormal left    ventricular filling pattern, with concomitant abnormal relaxation    and increased filling pressure (grade 2 diastolic dysfunction).  - Aortic valve: Trileaflet; mildly thickened, mildly calcified    leaflets. There was trivial regurgitation.  - Mitral valve: There was trivial regurgitation.  - Left atrium: The atrium was mildly dilated.  - Tricuspid valve: There was trivial regurgitation.  - Pulmonary arteries: PA peak pressure: 32 mm Hg (S).  Echocardiogram 03/06/2022  1. Left ventricular ejection fraction, by estimation, is 60 to 65%. The  left ventricle has normal function. The left ventricle has no regional  wall motion abnormalities. There is moderate  concentric left ventricular  hypertrophy. Left ventricular  diastolic parameters are indeterminate.   2. Right ventricular systolic function is normal. The right ventricular  size is normal. Tricuspid regurgitation signal is inadequate for assessing  PA pressure.   3. Left atrial size was mildly dilated.   4. The mitral valve is grossly normal. Trivial mitral valve  regurgitation. No evidence of mitral stenosis.   5. The aortic valve is tricuspid. There is mild calcification of the  aortic valve. There is mild thickening of the aortic valve. Aortic valve  regurgitation is trivial. No aortic stenosis is present.   6. The inferior vena cava is normal in size with greater than 50%  respiratory variability, suggesting right atrial pressure of 3 mmHg.   Comparison(s): No significant change from prior study.   Left heart cath 03/06/2022  Diagnostic Dominance: Right  Intervention    Conclusions: Multivessel CAD, including 70% ostial LMCA stenosis, CTO ostial LAD, 60% ISR  proximal/mid LCx, and 60% proximal/mid RCA. Patent distal LMCA and LCx stents with 60% ISR in proximal/mid LCx just distal to takeoff of OM1. Widely patent LIMA-LAD graft. Patent sequential left radial-D1 graft; jump segment from D1 to OM is chronically occluded. Mildly elevated LVEDP. Successful PCI to ostial/proximal LMCA using Synergy 3.0 x 12 mm drug-eluting stent (post-dilated to 3.3 mm) with 0% residual stenosis and TIMI-3 flow.   Recommendations: Dual antiplatelet therapy with aspirin and clopidogrel for at least 12 months, ideally longer Aggressive secondary prevention. Remove right femoral artery sheath two hours after discontinuation of bivalirudin.      Assessment & Plan   1. Coronary artery disease-stable.  Continues to have daily intermittent episodes of chest discomfort.  Describes pain as mild.Marland Kitchen  He was noted to have 70% LMCA stenosis and received PCI with DES to the lesion.  Details above.   Continue amlodipine, aspirin, clopidogrel, Imdur, Repatha, metoprolol, nitroglycerin  Heart healthy low-sodium diet-reviewed Maintain physical activity  Essential hypertension-BP today 136/60.  Maintain blood pressure log  Continue current medical therapy  Hyperlipidemia-Statin intolerant 03/05/2022: Cholesterol 284; HDL 40; LDL Cholesterol 203; Triglycerides 207; VLDL 41 Continue Repatha, aspirin high-fiber diet-reviewed Plan for repeat fasting lipids and LFTs 2/25  Carotid artery stenosis-denies recent headaches, presyncope, syncope, lightheadedness.  Carotid Doppler 03/09/2021 showed right ICA 40-59% stenosis, left ICA 1-39% stenosis Follows with vascular surgery  Disposition: Follow-up with Dr. Jens Som or me in 6 months.   Thomasene Ripple. Marques Ericson NP-C    12/14/2022, 3:27 PM [facility] [address] Office [phone] Fax [phone]  Notice: This dictation was prepared with Dragon dictation along with smaller phrase technology. Any  transcriptional errors that result from this process are unintentional and may not be corrected upon review.  I spent 14 minutes examining this patient, reviewing medications, and using patient centered shared decision making involving her cardiac care.  Prior to her visit I spent greater than 20 minutes reviewing her past medical history,  medications, and prior cardiac tests.

## 2022-12-14 ENCOUNTER — Ambulatory Visit: Payer: Medicare Other | Attending: General Practice | Admitting: General Practice

## 2022-12-14 ENCOUNTER — Encounter: Payer: Self-pay | Admitting: General Practice

## 2022-12-14 VITALS — BP 136/60 | HR 59 | Ht 62.0 in | Wt 143.0 lb

## 2022-12-14 DIAGNOSIS — I10 Essential (primary) hypertension: Secondary | ICD-10-CM | POA: Diagnosis not present

## 2022-12-14 DIAGNOSIS — E785 Hyperlipidemia, unspecified: Secondary | ICD-10-CM | POA: Diagnosis not present

## 2022-12-14 DIAGNOSIS — Z9861 Coronary angioplasty status: Secondary | ICD-10-CM | POA: Insufficient documentation

## 2022-12-14 DIAGNOSIS — I6523 Occlusion and stenosis of bilateral carotid arteries: Secondary | ICD-10-CM | POA: Diagnosis not present

## 2022-12-14 DIAGNOSIS — I2510 Atherosclerotic heart disease of native coronary artery without angina pectoris: Secondary | ICD-10-CM | POA: Insufficient documentation

## 2022-12-14 NOTE — Patient Instructions (Signed)
Medication Instructions:  The current medical regimen is effective;  continue present plan and medications as directed. Please refer to the Current Medication list given to you today.  *If you need a refill on your cardiac medications before your next appointment, please call your pharmacy*  Lab Work: MAKE SURE TO HAVE YOUR PRIMARY CARE DO YOUR CHOLESTEROL LABS  Other Instructions PLEASE READ AND FOLLOW [facility] PAIN HANDOUT GIVEN TO YOU TODAY MAINTAIN PHYSICAL ACTIVITY  Follow-Up: At [facility], you and your health needs are our priority.  As part of our continuing mission to provide you with exceptional heart care, we have created designated Provider Care Teams.  These Care Teams include your primary Cardiologist (physician) and Advanced Practice Providers (APPs -  Physician Assistants and Nurse Practitioners) who all work together to provide you with the care you need, when you need it.  Your next appointment:   6 month(s)  Provider:   Olga Millers, MD  or Edd Fabian, FNP or OR ANY APP

## 2023-01-23 ENCOUNTER — Other Ambulatory Visit: Payer: Self-pay | Admitting: Cardiology

## 2023-01-23 DIAGNOSIS — I10 Essential (primary) hypertension: Secondary | ICD-10-CM

## 2023-01-25 DIAGNOSIS — G8929 Other chronic pain: Secondary | ICD-10-CM | POA: Diagnosis not present

## 2023-01-25 DIAGNOSIS — I679 Cerebrovascular disease, unspecified: Secondary | ICD-10-CM | POA: Diagnosis not present

## 2023-01-25 DIAGNOSIS — I10 Essential (primary) hypertension: Secondary | ICD-10-CM | POA: Diagnosis not present

## 2023-01-25 DIAGNOSIS — N1831 Chronic kidney disease, stage 3a: Secondary | ICD-10-CM | POA: Diagnosis not present

## 2023-01-25 DIAGNOSIS — I739 Peripheral vascular disease, unspecified: Secondary | ICD-10-CM | POA: Diagnosis not present

## 2023-01-25 DIAGNOSIS — K219 Gastro-esophageal reflux disease without esophagitis: Secondary | ICD-10-CM | POA: Diagnosis not present

## 2023-01-25 DIAGNOSIS — D509 Iron deficiency anemia, unspecified: Secondary | ICD-10-CM | POA: Diagnosis not present

## 2023-01-25 DIAGNOSIS — I25119 Atherosclerotic heart disease of native coronary artery with unspecified angina pectoris: Secondary | ICD-10-CM | POA: Diagnosis not present

## 2023-01-25 DIAGNOSIS — G4762 Sleep related leg cramps: Secondary | ICD-10-CM | POA: Diagnosis not present

## 2023-01-25 DIAGNOSIS — R109 Unspecified abdominal pain: Secondary | ICD-10-CM | POA: Diagnosis not present

## 2023-01-25 DIAGNOSIS — F419 Anxiety disorder, unspecified: Secondary | ICD-10-CM | POA: Diagnosis not present

## 2023-01-25 DIAGNOSIS — Z23 Encounter for immunization: Secondary | ICD-10-CM | POA: Diagnosis not present

## 2023-01-25 DIAGNOSIS — E7800 Pure hypercholesterolemia, unspecified: Secondary | ICD-10-CM | POA: Diagnosis not present

## 2023-02-08 ENCOUNTER — Telehealth: Payer: Self-pay | Admitting: *Deleted

## 2023-02-08 NOTE — Telephone Encounter (Signed)
Spoke with pt, he reports the repatha cost was more than he could afford. Aware will forward a message to the pharmacist team to see if can get help for paying for his repatha.

## 2023-02-08 NOTE — Telephone Encounter (Signed)
-----   Message from Olga Millers sent at 01/28/2023  9:05 AM EST ----- Is he taking repatha; if not resume Olga Millers

## 2023-02-08 NOTE — Telephone Encounter (Signed)
Enrolled in [facility] grant   Card No. [id number]  BIN [id number]  PCN [id number]  PC Group [id number]  Info provided to [facility] - confirmed cost is $0.   Patient made aware.

## 2023-03-01 DIAGNOSIS — B079 Viral wart, unspecified: Secondary | ICD-10-CM | POA: Diagnosis not present

## 2023-03-01 DIAGNOSIS — F419 Anxiety disorder, unspecified: Secondary | ICD-10-CM | POA: Diagnosis not present

## 2023-03-30 ENCOUNTER — Other Ambulatory Visit: Payer: Self-pay | Admitting: General Practice

## 2023-04-17 DIAGNOSIS — N179 Acute kidney failure, unspecified: Secondary | ICD-10-CM | POA: Diagnosis not present

## 2023-04-17 DIAGNOSIS — I444 Left anterior fascicular block: Secondary | ICD-10-CM | POA: Diagnosis not present

## 2023-04-17 DIAGNOSIS — R0602 Shortness of breath: Secondary | ICD-10-CM | POA: Diagnosis not present

## 2023-04-17 DIAGNOSIS — R9431 Abnormal electrocardiogram [ECG] [EKG]: Secondary | ICD-10-CM | POA: Diagnosis not present

## 2023-04-17 DIAGNOSIS — R079 Chest pain, unspecified: Secondary | ICD-10-CM | POA: Diagnosis not present

## 2023-04-17 DIAGNOSIS — I493 Ventricular premature depolarization: Secondary | ICD-10-CM | POA: Diagnosis not present

## 2023-04-17 DIAGNOSIS — I447 Left bundle-branch block, unspecified: Secondary | ICD-10-CM | POA: Diagnosis not present

## 2023-04-18 DIAGNOSIS — N1832 Chronic kidney disease, stage 3b: Secondary | ICD-10-CM | POA: Diagnosis not present

## 2023-04-18 DIAGNOSIS — Z885 Allergy status to narcotic agent status: Secondary | ICD-10-CM | POA: Diagnosis not present

## 2023-04-18 DIAGNOSIS — F419 Anxiety disorder, unspecified: Secondary | ICD-10-CM | POA: Diagnosis not present

## 2023-04-18 DIAGNOSIS — Z87440 Personal history of urinary (tract) infections: Secondary | ICD-10-CM | POA: Diagnosis not present

## 2023-04-18 DIAGNOSIS — Z8673 Personal history of transient ischemic attack (TIA), and cerebral infarction without residual deficits: Secondary | ICD-10-CM | POA: Diagnosis not present

## 2023-04-18 DIAGNOSIS — Z7902 Long term (current) use of antithrombotics/antiplatelets: Secondary | ICD-10-CM | POA: Diagnosis not present

## 2023-04-18 DIAGNOSIS — Z8546 Personal history of malignant neoplasm of prostate: Secondary | ICD-10-CM | POA: Diagnosis not present

## 2023-04-18 DIAGNOSIS — I493 Ventricular premature depolarization: Secondary | ICD-10-CM | POA: Diagnosis not present

## 2023-04-18 DIAGNOSIS — I361 Nonrheumatic tricuspid (valve) insufficiency: Secondary | ICD-10-CM | POA: Diagnosis not present

## 2023-04-18 DIAGNOSIS — Z888 Allergy status to other drugs, medicaments and biological substances status: Secondary | ICD-10-CM | POA: Diagnosis not present

## 2023-04-18 DIAGNOSIS — Z91013 Allergy to seafood: Secondary | ICD-10-CM | POA: Diagnosis not present

## 2023-04-18 DIAGNOSIS — Z881 Allergy status to other antibiotic agents status: Secondary | ICD-10-CM | POA: Diagnosis not present

## 2023-04-18 DIAGNOSIS — M1990 Unspecified osteoarthritis, unspecified site: Secondary | ICD-10-CM | POA: Diagnosis not present

## 2023-04-18 DIAGNOSIS — I129 Hypertensive chronic kidney disease with stage 1 through stage 4 chronic kidney disease, or unspecified chronic kidney disease: Secondary | ICD-10-CM | POA: Diagnosis not present

## 2023-04-18 DIAGNOSIS — M549 Dorsalgia, unspecified: Secondary | ICD-10-CM | POA: Diagnosis present

## 2023-04-18 DIAGNOSIS — I252 Old myocardial infarction: Secondary | ICD-10-CM | POA: Diagnosis not present

## 2023-04-18 DIAGNOSIS — R0602 Shortness of breath: Secondary | ICD-10-CM | POA: Diagnosis not present

## 2023-04-18 DIAGNOSIS — I447 Left bundle-branch block, unspecified: Secondary | ICD-10-CM | POA: Diagnosis not present

## 2023-04-18 DIAGNOSIS — Z7982 Long term (current) use of aspirin: Secondary | ICD-10-CM | POA: Diagnosis not present

## 2023-04-18 DIAGNOSIS — N179 Acute kidney failure, unspecified: Secondary | ICD-10-CM | POA: Diagnosis present

## 2023-04-18 DIAGNOSIS — R079 Chest pain, unspecified: Secondary | ICD-10-CM | POA: Diagnosis not present

## 2023-04-18 DIAGNOSIS — I2510 Atherosclerotic heart disease of native coronary artery without angina pectoris: Secondary | ICD-10-CM | POA: Diagnosis not present

## 2023-04-18 DIAGNOSIS — Z951 Presence of aortocoronary bypass graft: Secondary | ICD-10-CM | POA: Diagnosis not present

## 2023-04-18 DIAGNOSIS — G8929 Other chronic pain: Secondary | ICD-10-CM | POA: Diagnosis not present

## 2023-04-18 DIAGNOSIS — Z955 Presence of coronary angioplasty implant and graft: Secondary | ICD-10-CM | POA: Diagnosis not present

## 2023-04-18 DIAGNOSIS — E785 Hyperlipidemia, unspecified: Secondary | ICD-10-CM | POA: Diagnosis not present

## 2023-04-18 DIAGNOSIS — Z880 Allergy status to penicillin: Secondary | ICD-10-CM | POA: Diagnosis not present

## 2023-04-18 DIAGNOSIS — F32A Depression, unspecified: Secondary | ICD-10-CM | POA: Diagnosis present

## 2023-04-18 DIAGNOSIS — I444 Left anterior fascicular block: Secondary | ICD-10-CM | POA: Diagnosis not present

## 2023-04-18 DIAGNOSIS — K219 Gastro-esophageal reflux disease without esophagitis: Secondary | ICD-10-CM | POA: Diagnosis not present

## 2023-04-18 DIAGNOSIS — R9431 Abnormal electrocardiogram [ECG] [EKG]: Secondary | ICD-10-CM | POA: Diagnosis not present

## 2023-04-19 DIAGNOSIS — R079 Chest pain, unspecified: Secondary | ICD-10-CM | POA: Diagnosis not present

## 2023-04-26 ENCOUNTER — Telehealth: Payer: Self-pay | Admitting: Nurse Practitioner

## 2023-04-26 DIAGNOSIS — R079 Chest pain, unspecified: Secondary | ICD-10-CM | POA: Diagnosis not present

## 2023-04-26 DIAGNOSIS — I25119 Atherosclerotic heart disease of native coronary artery with unspecified angina pectoris: Secondary | ICD-10-CM | POA: Diagnosis not present

## 2023-04-26 DIAGNOSIS — N1831 Chronic kidney disease, stage 3a: Secondary | ICD-10-CM | POA: Diagnosis not present

## 2023-04-26 DIAGNOSIS — Z79899 Other long term (current) drug therapy: Secondary | ICD-10-CM | POA: Diagnosis not present

## 2023-04-26 DIAGNOSIS — R1310 Dysphagia, unspecified: Secondary | ICD-10-CM | POA: Diagnosis not present

## 2023-04-26 DIAGNOSIS — I10 Essential (primary) hypertension: Secondary | ICD-10-CM | POA: Diagnosis not present

## 2023-04-26 NOTE — Telephone Encounter (Signed)
 Inbound call from Dr. Dannielle Huh office stating that patient is scheduled to see [facility] on 5/6 at 8:20 and Dr. Anna Genre is wanting to see if patient can be seen sooner than 5/6 due to patient experiencing bloating, weight loss and trouble swallowing.  Requested I send a message to the nurse to see if at all possible, and if so if we could call the patient to make him aware. Please advise.

## 2023-04-27 ENCOUNTER — Encounter: Payer: Self-pay | Admitting: Gastroenterology

## 2023-04-27 ENCOUNTER — Ambulatory Visit: Admitting: Gastroenterology

## 2023-04-27 ENCOUNTER — Other Ambulatory Visit: Payer: Self-pay | Admitting: Cardiology

## 2023-04-27 VITALS — BP 124/72 | HR 61 | Ht 62.0 in | Wt 139.0 lb

## 2023-04-27 DIAGNOSIS — I25810 Atherosclerosis of coronary artery bypass graft(s) without angina pectoris: Secondary | ICD-10-CM | POA: Diagnosis not present

## 2023-04-27 DIAGNOSIS — Z9889 Other specified postprocedural states: Secondary | ICD-10-CM

## 2023-04-27 DIAGNOSIS — F17220 Nicotine dependence, chewing tobacco, uncomplicated: Secondary | ICD-10-CM | POA: Diagnosis not present

## 2023-04-27 DIAGNOSIS — R1030 Lower abdominal pain, unspecified: Secondary | ICD-10-CM

## 2023-04-27 DIAGNOSIS — R159 Full incontinence of feces: Secondary | ICD-10-CM

## 2023-04-27 DIAGNOSIS — R152 Fecal urgency: Secondary | ICD-10-CM | POA: Diagnosis not present

## 2023-04-27 DIAGNOSIS — K5900 Constipation, unspecified: Secondary | ICD-10-CM | POA: Diagnosis not present

## 2023-04-27 DIAGNOSIS — Z860101 Personal history of adenomatous and serrated colon polyps: Secondary | ICD-10-CM | POA: Diagnosis not present

## 2023-04-27 DIAGNOSIS — R195 Other fecal abnormalities: Secondary | ICD-10-CM | POA: Diagnosis not present

## 2023-04-27 NOTE — Telephone Encounter (Signed)
 I have spoken to patient to see if he would be able to come today at 3 pm for appointment with [facility], PA-C. Patient states that he has to find a ride but does think he can come for this. States he will let us know if he is unable to get transportation to come, otherwise will be here.

## 2023-04-27 NOTE — Patient Instructions (Addendum)
 Start taking Miralax 1 capful (17 grams) 1x / day for 1 week.   If this is not effective, increase to 1 dose 2x / day for 1 week.   If this is still not effective, increase to two capfuls (34 grams) 2x / day.   Can adjust dose as needed based on response. Can take 1/2 cap daily, skip days, or increase per day.    We have given you samples of the following medication to take: iBgard   _______________________________________________________  If your blood pressure at your visit was 140/90 or greater, please contact your primary care physician to follow up on this.  _______________________________________________________  If you are age 83 or older, your body mass index should be between 23-30. Your Body mass index is 25.42 kg/m. If this is out of the aforementioned range listed, please consider follow up with your Primary Care Provider.  If you are age 54 or younger, your body mass index should be between 19-25. Your Body mass index is 25.42 kg/m. If this is out of the aformentioned range listed, please consider follow up with your Primary Care Provider.   ________________________________________________________  The [place] [facility] providers would like to encourage you to use [facility] to communicate with providers for non-urgent requests or questions.  Due to long hold times on the telephone, sending your provider a message by [facility] may be a faster and more efficient way to get a response.  Please allow 48 business hours for a response.  Please remember that this is for non-urgent requests.  _______________________________________________________  Thank you for trusting me with your gastrointestinal care!   Boone Master, PA

## 2023-04-27 NOTE — Progress Notes (Signed)
 Chief Complaint: Abdominal pain and loose stools Primary GI MD: Dr. Marina Goodell  HPI: 83 year old male history of hypertension, hyperlipidemia, CAD s/p CABG 2001, s/p stent placement 2019, A-fib, CAD s/p left carotid endarterectomy 2013,, CAD s/p stent placement 02/2022, prostate cancer S/P cryoablation, GERD, colon polyps, presents for evaluation of abdominal pain and loose stools.  Last seen 02/2021 by Alcide Evener, NP.  At this time he was having central lower abdominal pain which radiates up to his epigastric area on most days ongoing since 2022 with normal bowel movements.  CBC and CMP at that time were unrevealing.  He had CTAP with contrast 03/2021 which showed prominence of stool in descending and sigmoid colon indicating constipation.  Nonobstructive renal calculus.  Stable hepatic and renal cysts.  Aortic atherosclerosis and atherosclerotic calcification of aortic branch vessels including plaque proximally in the celiac trunk and SMA.  ------------------TODAY----------------------  Patient presents today with his granddaughter to discuss abdominal pain and loose stools.  He states for the past few years he has had abdominal pain with eating that has become worse over the past few months.    States when he eats food he has lower abdominal pain that radiates up into his epigastric area and then he becomes short of breath.  Soon after eating he will have loose stools that are urgent with some fecal incontinence.  He was noted to have constipation on his CT in 2023.  Denies melena, hematochezia, weight loss.  He is not on any type of bowel regimen.  PREVIOUS GI WORKUP   He underwent an EGD and colonoscopy by Dr.  02/25/2014, see there result below:   EGD 02/25/2014: EXAM:The esophagus revealed a large caliber distal esophageal ring but was otherwise normal. The stomach was normal. The duodenum was normal. Retroflexed views revealed a hiatal hernia. The scope was then withdrawn from the  patient and the procedure completed. THERAPY: 54 French Maloney dilator was passed into the esophagus without resistance or heme. ENDOSCOPIC IMPRESSION: 1. GERD 2. Distal esophageal stricture status post dilation   Colonoscopy 02/25/2014: 1. Two polyps measuring 3 mm in size were found in the ascending colon; polypectomy was performed with a cold snare 2. Moderate diverticulosis was noted in the sigmoid colon 3. The examination was otherwise normal - TUBULAR ADENOMA (TWO FRAGMENTS). NO HIGH GRADE DYSPLASIA OR MALIGNANCY   He was last seen in office by Dr. Marina Goodell 05/22/2019, at that time, a colon polyp surveillance colonoscopy was recommended.  However, the patient canceled this procedure.  Past Medical History:  Diagnosis Date   Anemia 10/06/2011   Anginal pain (HCC)    Arthritis    "joints hurt; shoulders, arms, back" (08/13/2013)   Atrial fibrillation (HCC)    B12 deficiency    Carotid stenosis 04/10/2011   a. s/p left carotid endarterectomy 04/14/2011.;  b.  Carotid US (11/13):  L CEA ok; RICA 1-39%   Chronic bronchitis (HCC)    "get it q yr"   Chronic chest pain    Chronic lower back pain    Colon polyp    adenomatous   Coronary artery disease    a. S/p CABG in 2001. b. S/p DES to protected LM and BMS to RCA 2006. c. 12/2009: s/p DES to Cx;  d. 09/2011 Cath: patent stents, patent grafts -->Med Rx.;  e. CP with abnl Nuc => LHC 10/04/12: oLM 30%, dLM stent into the CFX patent, LAD occluded, LIMA-LAD patent, RI 30%, mid AVCFX 30%, oOM1 50%, oRCA stent patent,  mRCA 50-60%, Radial graft-Dx patent; EF 55%.=> Med Rx.  f. stent to LM & Circ,   Daily headache    "for the last couple weeks" (08/13/2013)   Diverticulosis    Esophageal stricture    GERD (gastroesophageal reflux disease)    Hemorrhoids    Hiatal hernia    Hyperlipidemia    Hypertension    Left bundle branch block    Memory deficit    Pancreatitis 12/2009   ERCP ok.   Prostate cancer [facility])    s/p cryoablation   Renal  cyst    Seen on CT 08/2011 also with circumferential bladder wall thickening   Skin cancer    "cut it off my right ear"   Statin intolerance     Past Surgical History:  Procedure Laterality Date   CARDIAC CATHETERIZATION     CHOLECYSTECTOMY     COLONOSCOPY     CORONARY ANGIOPLASTY WITH STENT PLACEMENT     "1 + 1"   CORONARY ARTERY BYPASS GRAFT  2001   CORONARY STENT INTERVENTION N/A 09/11/2017   Procedure: CORONARY STENT INTERVENTION;  Surgeon: Runell Gess, MD;  Location: [facility] INV[facility];  Service: Cardiovascular;  Laterality: N/A;   CORONARY STENT INTERVENTION N/A 03/06/2022   Procedure: CORONARY STENT INTERVENTION;  Surgeon: Yvonne Kendall, MD;  Location: [facility];  Service: Cardiovascular;  Laterality: N/A;   CORONARY STENT PLACEMENT  09/11/2017   Ost Cx to Prox Cx lesion is 95% stenosed.   ENDARTERECTOMY  04/14/2011   Procedure: ENDARTERECTOMY CAROTID;  Surgeon: Pryor Ochoa, MD;  Location: [facility] OR;  Service: Vascular;  Laterality: Left;  Would like to perform procedure first, at 0730   EYE SURGERY     cataract   INGUINAL HERNIA REPAIR Bilateral    LEFT HEART CATH AND CORONARY ANGIOGRAPHY N/A 03/06/2022   Procedure: LEFT HEART CATH AND CORONARY ANGIOGRAPHY;  Surgeon: Yvonne Kendall, MD;  Location: [facility];  Service: Cardiovascular;  Laterality: N/A;   LEFT HEART CATH AND CORS/GRAFTS ANGIOGRAPHY N/A 09/11/2017   Procedure: LEFT HEART CATH AND CORS/GRAFTS ANGIOGRAPHY;  Surgeon: Runell Gess, MD;  Location: [facility];  Service: Cardiovascular;  Laterality: N/A;   LEFT HEART CATHETERIZATION WITH CORONARY/GRAFT ANGIOGRAM N/A 10/05/2011   Procedure: LEFT HEART CATHETERIZATION WITH Isabel Caprice;  Surgeon: Herby Abraham, MD;  Location: [facility] CATH LAB;  Service: Cardiovascular;  Laterality: N/A;   LEFT HEART CATHETERIZATION WITH CORONARY/GRAFT ANGIOGRAM N/A 10/04/2012   Procedure: LEFT HEART CATHETERIZATION WITH Isabel Caprice;  Surgeon: Dolores Patty, MD;  Location: [facility] CATH LAB;  Service: Cardiovascular;  Laterality: N/A;   POLYPECTOMY     PR VEIN BYPASS GRAFT,AORTO-FEM-POP     PROSTATE CRYOABLATION      Current Outpatient Medications  Medication Sig Dispense Refill   amLODipine (NORVASC) 10 MG tablet Take 10 mg by mouth daily.     aspirin EC 81 MG tablet Take 81 mg by mouth at bedtime.      clopidogrel (PLAVIX) 75 MG tablet TAKE 1 TABLET BY MOUTH DAILY WITH BREAKFAST. 90 tablet 2   Evolocumab (REPATHA SURECLICK) 140 MG/ML SOAJ Inject 140 mg into the skin every 14 (fourteen) days. 2 mL 11   ezetimibe (ZETIA) 10 MG tablet Take 1 tablet (10 mg total) by mouth daily. 30 tablet 3   GAS-X EXTRA STRENGTH 125 MG chewable tablet Chew 125 mg by mouth 3 (three) times daily with meals.     isosorbide mononitrate (IMDUR) 30  MG 24 hr tablet TAKE 1 TABLET (30 MG TOTAL) BY MOUTH DAILY. ** DO NOT CRUSH ** 90 tablet 2   LORazepam (ATIVAN) 0.5 MG tablet Take 0.5 mg by mouth daily as needed for anxiety.  2   losartan (COZAAR) 50 MG tablet TAKE 1 TABLET BY MOUTH EVERY DAY 90 tablet 3   metoprolol tartrate (LOPRESSOR) 25 MG tablet Take 1 tablet (25 mg total) by mouth 2 (two) times daily. (Patient taking differently: Take 25 mg by mouth 2 (two) times daily. Per patient taking twice a day) 60 tablet 3   nitroGLYCERIN (NITROSTAT) 0.4 MG SL tablet Place 1 tablet (0.4 mg total) under the tongue every 5 (five) minutes as needed. For chest pain (Patient taking differently: Place 0.4 mg under the tongue every 5 (five) minutes as needed for chest pain.) 25 tablet 11   pantoprazole (PROTONIX) 40 MG tablet Take 1 tablet (40 mg total) by mouth daily. (Patient taking differently: Take 40 mg by mouth 2 (two) times daily before a meal.) 30 tablet 1   PARoxetine (PAXIL) 20 MG tablet Take 20 mg by mouth at bedtime.   5   sucralfate (CARAFATE) 1 g tablet Take 1 g by mouth 3 (three) times daily.     No current facility-administered  medications for this visit.    Allergies as of 04/27/2023 - Review Complete 04/27/2023  Allergen Reaction Noted   Shellfish allergy Anaphylaxis 03/04/2011   Zolpidem tartrate Other (See Comments)    Zolpidem tartrate Other (See Comments) 11/14/2017   Brilinta [ticagrelor] Other (See Comments) 11/14/2017   Amoxicillin-pot clavulanate Other (See Comments)    Atorvastatin Other (See Comments) 01/21/2020   Codeine Nausea And Vomiting    Erythromycin Diarrhea, Nausea And Vomiting, and Other (See Comments) 10/18/2011   Hydrochlorothiazide Other (See Comments) 03/05/2022   Hydrocodone Nausea And Vomiting 03/04/2011   Morphine Nausea And Vomiting    Nitrofuran derivatives Other (See Comments) 03/04/2011   Oxycodone hcl Nausea And Vomiting    Tramadol Nausea And Vomiting 02/01/2010   Zocor [simvastatin] Other (See Comments) 01/21/2020    Family History  Problem Relation Age of Onset   Heart disease Mother        Heart Disease before age 60   Hypertension Mother    Heart attack Mother    Lung cancer Father    Hypertension Sister    Heart disease Sister        Heart Disease before age 88   Cancer Sister    Heart attack Sister    Hypertension Brother    Heart disease Brother        Heart Disease before age 34   Heart attack Brother    Colon cancer Neg Hx    Esophageal cancer Neg Hx    Pancreatic cancer Neg Hx    Stomach cancer Neg Hx     Social History   Socioeconomic History   Marital status: Widowed    Spouse name: Not on file   Number of children: 2   Years of education: Not on file   Highest education level: Not on file  Occupational History   Occupation: Retired    Comment: raised Programme researcher, broadcasting/film/video  Tobacco Use   Smoking status: Former    Types: Cigarettes   Smokeless tobacco: Current    Types: Chew   Tobacco comments:    "smoked a few cigarettes; no more than 1 month"  Vaping Use   Vaping status: Never Used  Substance and Sexual Activity  Alcohol use: No     Alcohol/week: 0.0 standard drinks of alcohol    Comment: "no alcohol since I was a teenager"   Drug use: No   Sexual activity: Not Currently  Other Topics Concern   Not on file  Social History Narrative   Patient is illiterate. He cannot read or write. He left school at about the seventh grade.   As of 10/2015 he reports that his wife is chronically ill at home with heart disease and COPD and is under hospice care. The bulk of the care is given by the patient and daughter.   Social Drivers of Corporate investment banker Strain: Not on file  Food Insecurity: Not on file  Transportation Needs: Not on file  Physical Activity: Not on file  Stress: Not on file  Social Connections: Not on file  Intimate Partner Violence: Not on file    Review of Systems:    Constitutional: No weight loss, fever, chills, weakness or fatigue HEENT: Eyes: No change in vision               Ears, Nose, Throat:  No change in hearing or congestion Skin: No rash or itching Cardiovascular: No chest pain, chest pressure or palpitations   Respiratory: No SOB or cough Gastrointestinal: See HPI and otherwise negative Genitourinary: No dysuria or change in urinary frequency Neurological: No headache, dizziness or syncope Musculoskeletal: No new muscle or joint pain Hematologic: No bleeding or bruising Psychiatric: No history of depression or anxiety    Physical Exam:  Vital signs: BP 124/72   Pulse 61   Ht 5\' 2"  (1.575 m)   Wt 139 lb (63 kg)   BMI 25.42 kg/m   Constitutional: NAD, Well developed, Well nourished, alert and cooperative Head:  Normocephalic and atraumatic. Eyes:   PEERL, EOMI. No icterus. Conjunctiva pink. Respiratory: Respirations even and unlabored. Lungs clear to auscultation bilaterally.   No wheezes, crackles, or rhonchi.  Cardiovascular:  Regular rate and rhythm. No peripheral edema, cyanosis or pallor.  Gastrointestinal:  Soft, nondistended, nontender. No rebound or guarding. Normal  bowel sounds. No appreciable masses or hepatomegaly. Rectal:  Not performed.  Msk:  Symmetrical without gross deformities. Without edema, no deformity or joint abnormality.  Neurologic:  Alert and  oriented x4;  grossly normal neurologically.  Skin:   Dry and intact without significant lesions or rashes. Psychiatric: Oriented to person, place and time. Demonstrates good judgement and reason without abnormal affect or behaviors.  RELEVANT LABS AND IMAGING: CBC    Component Value Date/Time   WBC 6.0 03/07/2022 0012   RBC 3.91 (L) 03/07/2022 0012   HGB 11.2 (L) 03/07/2022 0012   HCT 33.0 (L) 03/07/2022 0012   PLT 160 03/07/2022 0012   MCV 84.4 03/07/2022 0012   MCH 28.6 03/07/2022 0012   MCHC 33.9 03/07/2022 0012   RDW 14.8 03/07/2022 0012   LYMPHSABS 1.8 03/21/2021 1529   MONOABS 0.5 03/21/2021 1529   EOSABS 0.2 03/21/2021 1529   BASOSABS 0.1 03/21/2021 1529    CMP     Component Value Date/Time   NA 137 03/07/2022 0012   K 3.8 03/07/2022 0012   CL 106 03/07/2022 0012   CO2 26 03/07/2022 0012   GLUCOSE 128 (H) 03/07/2022 0012   BUN 19 03/07/2022 0012   CREATININE 1.32 (H) 03/07/2022 0012   CREATININE 1.26 04/07/2011 1549   CALCIUM 8.4 (L) 03/07/2022 0012   PROT 6.4 (L) 03/05/2022 0107   PROT 6.6 01/31/2018 1154  ALBUMIN 3.6 03/05/2022 0107   ALBUMIN 4.3 01/31/2018 1154   AST 20 03/05/2022 0107   ALT 16 03/05/2022 0107   ALKPHOS 45 03/05/2022 0107   BILITOT 1.0 03/05/2022 0107   BILITOT 1.2 01/31/2018 1154   GFRNONAA 54 (L) 03/07/2022 0012   GFRAA 56 (L) 09/28/2017 1912     Assessment/Plan:   Assessment & Plan  Lower abdominal pain Fecal incontinence/urgency Loose stools Suspect patient may be having overflow diarrhea with loose stools and fecal incontinence less urgency after eating especially with CT scan showing constipation in 2023 for similar symptoms.  However CT did show concern for possible chronic mesenteric ischemia.  Suspect with his history of  abdominal pain and shortness of breath with eating could be symptomatic chronic mesenteric ischemia.  He is not interested in vascular referral at this time. - Initiate daily Miralax for regular bowel movements. - Provide IB guard samples for pain management. - Educated on consistency with bowel regimen. - If consistent symptoms despite improvement in bowel regimen with MiraLAX and antispasmodic IBgard would recommend vascular surgery referral for chronic mesenteric ischemia - Follow-up 8 weeks  History of 2 tubular adenomatous polyps removed from the ascending colon per colonoscopy 02/2014.  Patient was scheduled for a colonoscopy in 2021 but he canceled this procedure.   CAD s/p CABG s/p stent on ASA. Recent stent placement 02/2022 (no longer on Plavix)     Carotid artery disease s/p left CEA 2013 on ASA   Patient is at high risk for any invasive evaluation secondary to age and multiple comorbidities.  He will need cardiac clearance prior to pursuing any endoscopic evaluation.    Boone Master, PA-C [facility] 04/27/2023, 3:32 PM  Cc: No ref. provider found

## 2023-04-27 NOTE — Progress Notes (Signed)
 Reviewed. BM, Overflow diarrhea is an uncommon problem, particularly in functioning people.  It is associated with fecal impaction.  Difficult to make this diagnosis without finding impacted stool on rectal exam.  High impaction is less common. MiraLAX in a patient without overflow diarrhea results and worsening diarrhea. We will see how he does with your recommendations. If it does not solve his issues, I believe that this patient may benefit from Citrucel 2 heaping tablespoons daily in 14 ounces of water or juice Thanks, Dr. Marina Goodell

## 2023-05-01 ENCOUNTER — Other Ambulatory Visit: Payer: Self-pay | Admitting: General Practice

## 2023-05-01 DIAGNOSIS — I2510 Atherosclerotic heart disease of native coronary artery without angina pectoris: Secondary | ICD-10-CM

## 2023-05-01 DIAGNOSIS — E785 Hyperlipidemia, unspecified: Secondary | ICD-10-CM

## 2023-05-28 NOTE — Progress Notes (Unsigned)
 Chief Complaint: follow up Primary GI MD: Dr. Elvin Hammer  HPI: 83 year old male history of hypertension, hyperlipidemia, CAD s/p CABG 2001, s/p stent placement 2019, A-fib, CAD s/p left carotid endarterectomy 2013,, CAD s/p stent placement 02/2022, prostate cancer S/P cryoablation, GERD, colon polyps, presents for follow up of abdominal pain and loose stools.     Seen 02/2021 by Everett Hitt, NP.  At this time he was having central lower abdominal pain which radiates up to his epigastric area on most days ongoing since 2022 with normal bowel movements.  CBC and CMP at that time were unrevealing.  He had CTAP with contrast 03/2021 which showed prominence of stool in descending and sigmoid colon indicating constipation.  Nonobstructive renal calculus.  Stable hepatic and renal cysts.  Aortic atherosclerosis and atherosclerotic calcification of aortic branch vessels including plaque proximally in the celiac trunk and SMA.      Seen 04/27/23 for loose stools, fecal incontinence, shortness of breath with eating. Concern for overflow versus chronic mesenteric ischemia. Recommended miralax and IBgard  ------------------TODAY----------------------  Patient states since last visit he continues to have loose stools after eating, though it is somewhat improved on MiraLAX 1 capful daily.  States his abdominal pain has resolved with IBgard.   PREVIOUS GI WORKUP   He underwent an EGD and colonoscopy by Dr.  02/25/2014, see there result below:   EGD 02/25/2014: EXAM:The esophagus revealed a large caliber distal esophageal ring but was otherwise normal. The stomach was normal. The duodenum was normal. Retroflexed views revealed a hiatal hernia. The scope was then withdrawn from the patient and the procedure completed. THERAPY: 54 French Maloney dilator was passed into the esophagus without resistance or heme. ENDOSCOPIC IMPRESSION: 1. GERD 2. Distal esophageal stricture status post dilation   Colonoscopy  02/25/2014: 1. Two polyps measuring 3 mm in size were found in the ascending colon; polypectomy was performed with a cold snare 2. Moderate diverticulosis was noted in the sigmoid colon 3. The examination was otherwise normal - TUBULAR ADENOMA (TWO FRAGMENTS). NO HIGH GRADE DYSPLASIA OR MALIGNANCY   He was last seen in office by Dr. Elvin Hammer 05/22/2019, at that time, a colon polyp surveillance colonoscopy was recommended.  However, the patient canceled this procedure.  Past Medical History:  Diagnosis Date   Anemia 10/06/2011   Anginal pain (HCC)    Arthritis    "joints hurt; shoulders, arms, back" (08/13/2013)   Atrial fibrillation (HCC)    B12 deficiency    Carotid stenosis 04/10/2011   a. s/p left carotid endarterectomy 04/14/2011.;  b.  Carotid US  (11/13):  L CEA ok; RICA 1-39%   Chronic bronchitis (HCC)    "get it q yr"   Chronic chest pain    Chronic lower back pain    Colon polyp    adenomatous   Coronary artery disease    a. S/p CABG in 2001. b. S/p DES to protected LM and BMS to RCA 2006. c. 12/2009: s/p DES to Cx;  d. 09/2011 Cath: patent stents, patent grafts -->Med Rx.;  e. CP with abnl Nuc => LHC 10/04/12: oLM 30%, dLM stent into the CFX patent, LAD occluded, LIMA-LAD patent, RI 30%, mid AVCFX 30%, oOM1 50%, oRCA stent patent, mRCA 50-60%, Radial graft-Dx patent; EF 55%.=> Med Rx.  f. stent to LM & Circ,   Daily headache    "for the last couple weeks" (08/13/2013)   Diverticulosis    Esophageal stricture    GERD (gastroesophageal reflux disease)  Hemorrhoids    Hiatal hernia    Hyperlipidemia    Hypertension    Left bundle branch block    Memory deficit    Pancreatitis 12/2009   ERCP ok.   Prostate cancer [facility])    s/p cryoablation   Renal cyst    Seen on CT 08/2011 also with circumferential bladder wall thickening   Skin cancer    "cut it off my right ear"   Statin intolerance     Past Surgical History:  Procedure Laterality Date   CARDIAC CATHETERIZATION      CHOLECYSTECTOMY     COLONOSCOPY     CORONARY ANGIOPLASTY WITH STENT PLACEMENT     "1 + 1"   CORONARY ARTERY BYPASS GRAFT  2001   CORONARY STENT INTERVENTION N/A 09/11/2017   Procedure: CORONARY STENT INTERVENTION;  Surgeon: Avanell Leigh, MD;  Location: [facility];  Service: Cardiovascular;  Laterality: N/A;   CORONARY STENT INTERVENTION N/A 03/06/2022   Procedure: CORONARY STENT INTERVENTION;  Surgeon: Sammy Crisp, MD;  Location: [facility];  Service: Cardiovascular;  Laterality: N/A;   CORONARY STENT PLACEMENT  09/11/2017   Ost Cx to Prox Cx lesion is 95% stenosed.   ENDARTERECTOMY  04/14/2011   Procedure: ENDARTERECTOMY CAROTID;  Surgeon: Palma Bob, MD;  Location: [facility] OR;  Service: Vascular;  Laterality: Left;  Would like to perform procedure first, at 0730   EYE SURGERY     cataract   INGUINAL HERNIA REPAIR Bilateral    LEFT HEART CATH AND CORONARY ANGIOGRAPHY N/A 03/06/2022   Procedure: LEFT HEART CATH AND CORONARY ANGIOGRAPHY;  Surgeon: Sammy Crisp, MD;  Location: [facility];  Service: Cardiovascular;  Laterality: N/A;   LEFT HEART CATH AND CORS/GRAFTS ANGIOGRAPHY N/A 09/11/2017   Procedure: LEFT HEART CATH AND CORS/GRAFTS ANGIOGRAPHY;  Surgeon: Avanell Leigh, MD;  Location: [facility];  Service: Cardiovascular;  Laterality: N/A;   LEFT HEART CATHETERIZATION WITH CORONARY/GRAFT ANGIOGRAM N/A 10/05/2011   Procedure: LEFT HEART CATHETERIZATION WITH Estella Helling;  Surgeon: Kristopher Pheasant, MD;  Location: [facility] CATH LAB;  Service: Cardiovascular;  Laterality: N/A;   LEFT HEART CATHETERIZATION WITH CORONARY/GRAFT ANGIOGRAM N/A 10/04/2012   Procedure: LEFT HEART CATHETERIZATION WITH Estella Helling;  Surgeon: Mardell Shade, MD;  Location: [facility] CATH LAB;  Service: Cardiovascular;  Laterality: N/A;   POLYPECTOMY     PR VEIN BYPASS GRAFT,AORTO-FEM-POP     PROSTATE CRYOABLATION      Current Outpatient Medications   Medication Sig Dispense Refill   amLODipine  (NORVASC ) 10 MG tablet Take 10 mg by mouth daily.     aspirin  EC 81 MG tablet Take 81 mg by mouth at bedtime.      clopidogrel  (PLAVIX ) 75 MG tablet TAKE 1 TABLET BY MOUTH DAILY WITH BREAKFAST. 90 tablet 2   Evolocumab  (REPATHA  SURECLICK) 140 MG/ML SOAJ INJECT 140 MG INTO THE SKIN EVERY 14 (FOURTEEN) DAYS. 6 mL 3   GAS-X EXTRA STRENGTH 125 MG chewable tablet Chew 125 mg by mouth 3 (three) times daily with meals.     isosorbide  mononitrate (IMDUR ) 30 MG 24 hr tablet TAKE 1 TABLET (30 MG TOTAL) BY MOUTH DAILY. ** DO NOT CRUSH ** 90 tablet 2   LORazepam  (ATIVAN ) 0.5 MG tablet Take 0.5 mg by mouth daily as needed for anxiety.  2   losartan  (COZAAR ) 50 MG tablet TAKE 1 TABLET BY MOUTH EVERY DAY 90 tablet 3   metoprolol  tartrate (LOPRESSOR ) 25 MG tablet Take  1 tablet (25 mg total) by mouth 2 (two) times daily. (Patient taking differently: Take 25 mg by mouth 2 (two) times daily. Per patient taking twice a day) 60 tablet 3   nitroGLYCERIN  (NITROSTAT ) 0.4 MG SL tablet Place 1 tablet (0.4 mg total) under the tongue every 5 (five) minutes as needed. For chest pain (Patient taking differently: Place 0.4 mg under the tongue every 5 (five) minutes as needed for chest pain.) 25 tablet 11   pantoprazole  (PROTONIX ) 40 MG tablet Take 1 tablet (40 mg total) by mouth daily. (Patient taking differently: Take 40 mg by mouth 2 (two) times daily before a meal.) 30 tablet 1   PARoxetine  (PAXIL ) 20 MG tablet Take 20 mg by mouth at bedtime.   5   ezetimibe  (ZETIA ) 10 MG tablet Take 1 tablet (10 mg total) by mouth daily. (Patient not taking: Reported on 05/29/2023) 30 tablet 3   sucralfate  (CARAFATE ) 1 g tablet Take 1 g by mouth 3 (three) times daily. (Patient not taking: Reported on 05/29/2023)     No current facility-administered medications for this visit.    Allergies as of 05/29/2023 - Review Complete 04/27/2023  Allergen Reaction Noted   Shellfish allergy Anaphylaxis  03/04/2011   Zolpidem tartrate Other (See Comments)    Zolpidem tartrate Other (See Comments) 11/14/2017   Brilinta  [ticagrelor ] Other (See Comments) 11/14/2017   Amoxicillin-pot clavulanate Other (See Comments)    Atorvastatin  Other (See Comments) 01/21/2020   Codeine Nausea And Vomiting    Erythromycin Diarrhea, Nausea And Vomiting, and Other (See Comments) 10/18/2011   Hydrochlorothiazide Other (See Comments) 03/05/2022   Hydrocodone  Nausea And Vomiting 03/04/2011   Morphine  Nausea And Vomiting    Nitrofuran derivatives Other (See Comments) 03/04/2011   Oxycodone hcl Nausea And Vomiting    Tramadol Nausea And Vomiting 02/01/2010   Zocor [simvastatin] Other (See Comments) 01/21/2020    Family History  Problem Relation Age of Onset   Heart disease Mother        Heart Disease before age 50   Hypertension Mother    Heart attack Mother    Lung cancer Father    Hypertension Sister    Heart disease Sister        Heart Disease before age 5   Cancer Sister    Heart attack Sister    Hypertension Brother    Heart disease Brother        Heart Disease before age 63   Heart attack Brother    Colon cancer Neg Hx    Esophageal cancer Neg Hx    Pancreatic cancer Neg Hx    Stomach cancer Neg Hx     Social History   Socioeconomic History   Marital status: Widowed    Spouse name: Not on file   Number of children: 2   Years of education: Not on file   Highest education level: Not on file  Occupational History   Occupation: Retired    Comment: raised Programme researcher, broadcasting/film/video  Tobacco Use   Smoking status: Former    Types: Cigarettes   Smokeless tobacco: Current    Types: Chew   Tobacco comments:    "smoked a few cigarettes; no more than 1 month"  Vaping Use   Vaping status: Never Used  Substance and Sexual Activity   Alcohol use: No    Alcohol/week: 0.0 standard drinks of alcohol    Comment: "no alcohol since I was a teenager"   Drug use: No   Sexual activity: Not Currently  Other  Topics Concern   Not on file  Social History Narrative   Patient is illiterate. He cannot read or write. He left school at about the seventh grade.   As of 10/2015 he reports that his wife is chronically ill at home with heart disease and COPD and is under hospice care. The bulk of the care is given by the patient and daughter.   Social Drivers of Corporate investment banker Strain: Not on file  Food Insecurity: Not on file  Transportation Needs: Not on file  Physical Activity: Not on file  Stress: Not on file  Social Connections: Not on file  Intimate Partner Violence: Not on file    Review of Systems:    Constitutional: No weight loss, fever, chills, weakness or fatigue HEENT: Eyes: No change in vision               Ears, Nose, Throat:  No change in hearing or congestion Skin: No rash or itching Cardiovascular: No chest pain, chest pressure or palpitations   Respiratory: No SOB or cough Gastrointestinal: See HPI and otherwise negative Genitourinary: No dysuria or change in urinary frequency Neurological: No headache, dizziness or syncope Musculoskeletal: No new muscle or joint pain Hematologic: No bleeding or bruising Psychiatric: No history of depression or anxiety    Physical Exam:  Vital signs: BP 118/68   Pulse (!) 57   Ht 5\' 4"  (1.626 m)   Wt 140 lb (63.5 kg)   BMI 24.03 kg/m   Constitutional: NAD, Well developed, Well nourished, alert and cooperative Head:  Normocephalic and atraumatic. Eyes:   PEERL, EOMI. No icterus. Conjunctiva pink. Respiratory: Respirations even and unlabored. Lungs clear to auscultation bilaterally.   No wheezes, crackles, or rhonchi.  Cardiovascular:  Regular rate and rhythm. No peripheral edema, cyanosis or pallor.  Gastrointestinal:  Soft, nondistended, nontender. No rebound or guarding. hypoactive bowel sounds. No appreciable masses or hepatomegaly. Rectal:  Not performed.  Msk:  Symmetrical without gross deformities. Without edema,  no deformity or joint abnormality.  Neurologic:  Alert and  oriented x4;  grossly normal neurologically.  Skin:   Dry and intact without significant lesions or rashes. Psychiatric: Oriented to person, place and time. Demonstrates good judgement and reason without abnormal affect or behaviors.  RELEVANT LABS AND IMAGING: CBC    Component Value Date/Time   WBC 6.0 03/07/2022 0012   RBC 3.91 (L) 03/07/2022 0012   HGB 11.2 (L) 03/07/2022 0012   HCT 33.0 (L) 03/07/2022 0012   PLT 160 03/07/2022 0012   MCV 84.4 03/07/2022 0012   MCH 28.6 03/07/2022 0012   MCHC 33.9 03/07/2022 0012   RDW 14.8 03/07/2022 0012   LYMPHSABS 1.8 03/21/2021 1529   MONOABS 0.5 03/21/2021 1529   EOSABS 0.2 03/21/2021 1529   BASOSABS 0.1 03/21/2021 1529    CMP     Component Value Date/Time   NA 137 03/07/2022 0012   K 3.8 03/07/2022 0012   CL 106 03/07/2022 0012   CO2 26 03/07/2022 0012   GLUCOSE 128 (H) 03/07/2022 0012   BUN 19 03/07/2022 0012   CREATININE 1.32 (H) 03/07/2022 0012   CREATININE 1.26 04/07/2011 1549   CALCIUM  8.4 (L) 03/07/2022 0012   PROT 6.4 (L) 03/05/2022 0107   PROT 6.6 01/31/2018 1154   ALBUMIN 3.6 03/05/2022 0107   ALBUMIN 4.3 01/31/2018 1154   AST 20 03/05/2022 0107   ALT 16 03/05/2022 0107   ALKPHOS 45 03/05/2022 0107   BILITOT 1.0  03/05/2022 0107   BILITOT 1.2 01/31/2018 1154   GFRNONAA 54 (L) 03/07/2022 0012   GFRAA 56 (L) 09/28/2017 1912     Assessment/Plan:   Assessment & Plan  Lower abdominal pain Fecal incontinence/urgency Loose stools Suspect patient may be having overflow diarrhea with loose stools and fecal incontinence less urgency after eating especially with CT scan showing constipation in 2023 for similar symptoms.  However CT did show concern for possible chronic mesenteric ischemia.  Abdominal pain improved with IBgard.  Still having loose stools though somewhat improved on MiraLAX daily.  Has not tried fiber. -- KUB to evaluate stool burden -  Recommend addition of fiber 1-2 times daily - Alert made to call patient in 3 weeks to see how he is doing so we can adjust treatment - Continue IBgard - Follow-up 8 weeks  History of 2 tubular adenomatous polyps removed from the ascending colon per colonoscopy 02/2014.  Patient was scheduled for a colonoscopy in 2021 but he canceled this procedure.   CAD s/p CABG s/p stent on ASA. Recent stent placement 02/2022 (no longer on Plavix )   Carotid artery disease s/p left CEA 2013 on ASA   Patient is at high risk for any invasive evaluation secondary to age and multiple comorbidities.  He will need cardiac clearance prior to pursuing any endoscopic evaluation.  Patient would also like to avoid procedures if possible   Gigi [facility] 05/29/2023, 8:50 AM  Cc: No ref. provider found

## 2023-05-29 ENCOUNTER — Ambulatory Visit (INDEPENDENT_AMBULATORY_CARE_PROVIDER_SITE_OTHER): Admitting: Gastroenterology

## 2023-05-29 ENCOUNTER — Encounter: Payer: Self-pay | Admitting: Gastroenterology

## 2023-05-29 ENCOUNTER — Ambulatory Visit (INDEPENDENT_AMBULATORY_CARE_PROVIDER_SITE_OTHER)
Admission: RE | Admit: 2023-05-29 | Discharge: 2023-05-29 | Disposition: A | Discharge status: Home / Self Care | Source: Ambulatory Visit | Attending: Gastroenterology

## 2023-05-29 VITALS — BP 118/68 | HR 57 | Ht 64.0 in | Wt 140.0 lb

## 2023-05-29 DIAGNOSIS — R197 Diarrhea, unspecified: Secondary | ICD-10-CM | POA: Diagnosis not present

## 2023-05-29 DIAGNOSIS — I25810 Atherosclerosis of coronary artery bypass graft(s) without angina pectoris: Secondary | ICD-10-CM

## 2023-05-29 DIAGNOSIS — R159 Full incontinence of feces: Secondary | ICD-10-CM | POA: Diagnosis not present

## 2023-05-29 DIAGNOSIS — I878 Other specified disorders of veins: Secondary | ICD-10-CM | POA: Diagnosis not present

## 2023-05-29 DIAGNOSIS — R1030 Lower abdominal pain, unspecified: Secondary | ICD-10-CM

## 2023-05-29 DIAGNOSIS — N200 Calculus of kidney: Secondary | ICD-10-CM | POA: Diagnosis not present

## 2023-05-29 DIAGNOSIS — Z9889 Other specified postprocedural states: Secondary | ICD-10-CM

## 2023-05-29 DIAGNOSIS — R109 Unspecified abdominal pain: Secondary | ICD-10-CM | POA: Diagnosis not present

## 2023-05-29 NOTE — Patient Instructions (Addendum)
 Your provider has requested that you have an abdominal x ray before leaving today. Please go to the basement floor to our Radiology department for the test.  _______________________________________________________  If your blood pressure at your visit was 140/90 or greater, please contact your primary care physician to follow up on this.  _______________________________________________________  If you are age 83 or older, your body mass index should be between 23-30. Your Body mass index is 24.03 kg/m. If this is out of the aforementioned range listed, please consider follow up with your Primary Care Provider.  If you are age 68 or younger, your body mass index should be between 19-25. Your Body mass index is 24.03 kg/m. If this is out of the aformentioned range listed, please consider follow up with your Primary Care Provider.   ________________________________________________________  The [facility] providers would like to encourage you to use MYCHART to communicate with providers for non-urgent requests or questions.  Due to long hold times on the telephone, sending your provider a message by [facility] may be a faster and more efficient way to get a response.  Please allow 48 business hours for a response.  Please remember that this is for non-urgent requests.  _______________________________________________________

## 2023-06-12 NOTE — Progress Notes (Signed)
 Noted

## 2023-06-22 ENCOUNTER — Ambulatory Visit: Admitting: Gastroenterology

## 2023-06-24 ENCOUNTER — Emergency Department (HOSPITAL_COMMUNITY)

## 2023-06-24 ENCOUNTER — Observation Stay (HOSPITAL_COMMUNITY)
Admission: EM | Admit: 2023-06-24 | Discharge: 2023-06-25 | Disposition: A | Discharge status: Home / Self Care | Attending: Internal Medicine | Admitting: Internal Medicine

## 2023-06-24 ENCOUNTER — Other Ambulatory Visit: Payer: Self-pay

## 2023-06-24 DIAGNOSIS — I129 Hypertensive chronic kidney disease with stage 1 through stage 4 chronic kidney disease, or unspecified chronic kidney disease: Secondary | ICD-10-CM | POA: Diagnosis not present

## 2023-06-24 DIAGNOSIS — Z8719 Personal history of other diseases of the digestive system: Secondary | ICD-10-CM | POA: Diagnosis not present

## 2023-06-24 DIAGNOSIS — R079 Chest pain, unspecified: Secondary | ICD-10-CM | POA: Diagnosis not present

## 2023-06-24 DIAGNOSIS — R0789 Other chest pain: Principal | ICD-10-CM | POA: Insufficient documentation

## 2023-06-24 DIAGNOSIS — N1832 Chronic kidney disease, stage 3b: Secondary | ICD-10-CM | POA: Diagnosis not present

## 2023-06-24 DIAGNOSIS — Z7901 Long term (current) use of anticoagulants: Secondary | ICD-10-CM | POA: Diagnosis not present

## 2023-06-24 DIAGNOSIS — E785 Hyperlipidemia, unspecified: Secondary | ICD-10-CM | POA: Insufficient documentation

## 2023-06-24 DIAGNOSIS — Z87891 Personal history of nicotine dependence: Secondary | ICD-10-CM | POA: Insufficient documentation

## 2023-06-24 DIAGNOSIS — F419 Anxiety disorder, unspecified: Secondary | ICD-10-CM | POA: Diagnosis not present

## 2023-06-24 DIAGNOSIS — F32A Depression, unspecified: Secondary | ICD-10-CM | POA: Insufficient documentation

## 2023-06-24 DIAGNOSIS — Z79899 Other long term (current) drug therapy: Secondary | ICD-10-CM | POA: Diagnosis not present

## 2023-06-24 DIAGNOSIS — I10 Essential (primary) hypertension: Secondary | ICD-10-CM | POA: Diagnosis not present

## 2023-06-24 DIAGNOSIS — Z951 Presence of aortocoronary bypass graft: Secondary | ICD-10-CM | POA: Diagnosis not present

## 2023-06-24 DIAGNOSIS — I517 Cardiomegaly: Secondary | ICD-10-CM | POA: Diagnosis not present

## 2023-06-24 LAB — CBC WITH DIFFERENTIAL/PLATELET
Abs Immature Granulocytes: 0.01 10*3/uL (ref 0.00–0.07)
Basophils Absolute: 0.1 10*3/uL (ref 0.0–0.1)
Basophils Relative: 1 %
Eosinophils Absolute: 0.2 10*3/uL (ref 0.0–0.5)
Eosinophils Relative: 4 %
HCT: 38.1 % — ABNORMAL LOW (ref 39.0–52.0)
Hemoglobin: 13 g/dL (ref 13.0–17.0)
Immature Granulocytes: 0 %
Lymphocytes Relative: 27 %
Lymphs Abs: 1.6 10*3/uL (ref 0.7–4.0)
MCH: 31.8 pg (ref 26.0–34.0)
MCHC: 34.1 g/dL (ref 30.0–36.0)
MCV: 93.2 fL (ref 80.0–100.0)
Monocytes Absolute: 0.6 10*3/uL (ref 0.1–1.0)
Monocytes Relative: 10 %
Neutro Abs: 3.5 10*3/uL (ref 1.7–7.7)
Neutrophils Relative %: 58 %
Platelets: 182 10*3/uL (ref 150–400)
RBC: 4.09 MIL/uL — ABNORMAL LOW (ref 4.22–5.81)
RDW: 12.7 % (ref 11.5–15.5)
WBC: 6 10*3/uL (ref 4.0–10.5)
nRBC: 0 % (ref 0.0–0.2)

## 2023-06-24 LAB — BASIC METABOLIC PANEL WITH GFR
Anion gap: 7 (ref 5–15)
BUN: 24 mg/dL — ABNORMAL HIGH (ref 8–23)
CO2: 25 mmol/L (ref 22–32)
Calcium: 8.4 mg/dL — ABNORMAL LOW (ref 8.9–10.3)
Chloride: 105 mmol/L (ref 98–111)
Creatinine, Ser: 1.73 mg/dL — ABNORMAL HIGH (ref 0.61–1.24)
GFR, Estimated: 39 mL/min — ABNORMAL LOW (ref >60)
Glucose, Bld: 102 mg/dL — ABNORMAL HIGH (ref 70–99)
Potassium: 4.7 mmol/L (ref 3.5–5.1)
Sodium: 137 mmol/L (ref 135–145)

## 2023-06-24 LAB — TROPONIN I (HIGH SENSITIVITY): Troponin I (High Sensitivity): 14 ng/L (ref <18)

## 2023-06-24 NOTE — ED Provider Notes (Signed)
 [place] [facility]MENT AT [facility] Provider Note   CSN: [id number] Arrival date & time: 06/24/23  2205     History {Add pertinent medical, surgical, social history, OB history to HPI:1} Chief Complaint  Patient presents with   Chest Pain    [name] [name] is a 83 y.o. male.  Patient with past medical history significant for coronary artery disease, status post CABG and stenting with most recent left heart catheterization and stenting in February 2024, memory loss, atrial fibrillation, GERD, chronic chest pain presents to the emergency department via EMS complaining of chest pain.  He states he has chest pain at baseline but over the past 2 days and worsening today he has had substernal chest pain.  Pain is rated 5 out of 10 in severity with some mild associated shortness of breath.  He denies radiation of symptoms, abdominal pain, nausea, vomiting.  The patient is a poor historian and does not remember his previous heart attack when asked.  During my evaluation he intermittently endorses chest pain with associated shortness of breath and the denies the same.   Chest Pain      Home Medications Prior to Admission medications   Medication Sig Start Date End Date Taking? Authorizing Provider  amLODipine  (NORVASC ) 10 MG tablet Take 10 mg by mouth daily.    [provider]  aspirin  EC 81 MG tablet Take 81 mg by mouth at bedtime.     [provider]  clopidogrel  (PLAVIX ) 75 MG tablet TAKE 1 TABLET BY MOUTH DAILY WITH BREAKFAST. 03/30/23   Lenise Quince, MD  Evolocumab  (REPATHA  SURECLICK) 140 MG/ML SOAJ INJECT 140 MG INTO THE SKIN EVERY 14 (FOURTEEN) DAYS. 05/01/23   Carie Charity, NP  ezetimibe  (ZETIA ) 10 MG tablet Take 1 tablet (10 mg total) by mouth daily. Patient not taking: Reported on 05/29/2023 03/08/22   Zhao, Xika, NP  GAS-X EXTRA STRENGTH 125 MG chewable tablet Chew 125 mg by mouth 3 (three) times daily with meals.    [provider]   isosorbide  mononitrate (IMDUR ) 30 MG 24 hr tablet TAKE 1 TABLET (30 MG TOTAL) BY MOUTH DAILY. ** DO NOT CRUSH ** 04/27/23   Lenise Quince, MD  LORazepam  (ATIVAN ) 0.5 MG tablet Take 0.5 mg by mouth daily as needed for anxiety. 07/17/16   [provider]  losartan  (COZAAR ) 50 MG tablet TAKE 1 TABLET BY MOUTH EVERY DAY 01/23/23   Carie Charity, NP  metoprolol  tartrate (LOPRESSOR ) 25 MG tablet Take 1 tablet (25 mg total) by mouth 2 (two) times daily. Patient taking differently: Take 25 mg by mouth 2 (two) times daily. Per patient taking twice a day 03/07/22   Zhao, Xika, NP  nitroGLYCERIN  (NITROSTAT ) 0.4 MG SL tablet Place 1 tablet (0.4 mg total) under the tongue every 5 (five) minutes as needed. For chest pain Patient taking differently: Place 0.4 mg under the tongue every 5 (five) minutes as needed for chest pain. 09/13/17   Lenise Quince, MD  pantoprazole  (PROTONIX ) 40 MG tablet Take 1 tablet (40 mg total) by mouth daily. Patient taking differently: Take 40 mg by mouth 2 (two) times daily before a meal. 09/12/17   Mariah Shines, NP  PARoxetine  (PAXIL ) 20 MG tablet Take 20 mg by mouth at bedtime.  04/18/17   [provider]  sucralfate  (CARAFATE ) 1 g tablet Take 1 g by mouth 3 (three) times daily. Patient not taking: Reported on 05/29/2023 02/21/22   [provider]  Allergies    Shellfish allergy, Zolpidem tartrate, Zolpidem tartrate, Brilinta  [ticagrelor ], Amoxicillin-pot clavulanate, Atorvastatin , Codeine, Erythromycin, Hydrochlorothiazide, Hydrocodone , Morphine , Nitrofuran derivatives, Oxycodone hcl, Tramadol, and Zocor [simvastatin]    Review of Systems   Review of Systems  Cardiovascular:  Positive for chest pain.    Physical Exam Updated Vital Signs BP (!) 141/64   Pulse (!) 58   Resp 16   Ht 5' (1.524 m)   Wt 64.4 kg   SpO2 98%   BMI 27.73 kg/m  Physical Exam Vitals and nursing note reviewed.  Constitutional:      General: He is not in  acute distress.    Appearance: He is well-developed.  HENT:     Head: Normocephalic and atraumatic.  Eyes:     Conjunctiva/sclera: Conjunctivae normal.  Cardiovascular:     Rate and Rhythm: Normal rate and regular rhythm.  Pulmonary:     Effort: Pulmonary effort is normal. No respiratory distress.     Breath sounds: Normal breath sounds.  Chest:     Chest wall: No tenderness.  Abdominal:     Palpations: Abdomen is soft.     Tenderness: There is no abdominal tenderness.  Musculoskeletal:        General: No swelling.     Cervical back: Neck supple.     Right lower leg: No edema.     Left lower leg: No edema.  Skin:    General: Skin is warm and dry.     Capillary Refill: Capillary refill takes less than 2 seconds.  Neurological:     Mental Status: He is alert.     Comments: At baseline  Psychiatric:        Mood and Affect: Mood normal.     ED Results / Procedures / Treatments   Labs (all labs ordered are listed, but only abnormal results are displayed) Labs Reviewed  BASIC METABOLIC PANEL WITH GFR - Abnormal; Notable for the following components:      Result Value   Glucose, Bld 102 (*)    BUN 24 (*)    Creatinine, Ser 1.73 (*)    Calcium  8.4 (*)    GFR, Estimated 39 (*)    All other components within normal limits  CBC WITH DIFFERENTIAL/PLATELET - Abnormal; Notable for the following components:   RBC 4.09 (*)    HCT 38.1 (*)    All other components within normal limits  TROPONIN I (HIGH SENSITIVITY)    EKG EKG Interpretation Date/Time:  Sunday June 24 2023 22:15:11 EDT Ventricular Rate:  59 PR Interval:  163 QRS Duration:  144 QT Interval:  437 QTC Calculation: 433 R Axis:   17  Text Interpretation: Sinus rhythm Ventricular premature complex IVCD, consider atypical LBBB No significant change since last tracing Confirmed by Hiawatha Lout ([id number]) on 06/24/2023 10:19:54 PM  Radiology DG Chest 2 View Result Date: 06/24/2023 CLINICAL DATA:  Chest pain a  EXAM: CHEST - 2 VIEW COMPARISON:  Chest x-ray 04/17/2023 FINDINGS: The heart is enlarged. The lungs are clear. There is no pleural effusion or pneumothorax. Patient is status post cardiac surgery. No acute fractures are seen. IMPRESSION: Cardiomegaly. No acute cardiopulmonary process. Electronically Signed   By: Tyron Gallon M.D.   On: 06/24/2023 22:39    Procedures Procedures  {Document cardiac monitor, telemetry assessment procedure when appropriate:1}  Medications Ordered in ED Medications - No data to display  ED Course/ Medical Decision Making/ A&P   {   Click here for ABCD2, HEART and  other calculatorsREFRESH Note before signing :1}                              Medical Decision Making Amount and/or Complexity of Data Reviewed Labs: ordered. Radiology: ordered.   This patient presents to the ED for concern of chest pain, this involves an extensive number of treatment options, and is a complaint that carries with it a high risk of complications and morbidity.  The differential diagnosis includes ACS, stable angina, unstable angina, pneumonia, musculoskeletal pain, others   Co morbidities / Chronic conditions that complicate the patient evaluation  History of multiple stents, CABG, hypertension, memory loss   Additional history obtained:  Additional history obtained from EMR External records from outside source obtained and reviewed including cardiology notes   Lab Tests:  I Ordered, and personally interpreted labs.  The pertinent results include: Mildly elevated creatinine at 1.73, initial troponin 14   Imaging Studies ordered:  I ordered imaging studies including chest x-ray I independently visualized and interpreted imaging which showed cardiomegaly with no acute process I agree with the radiologist interpretation   Cardiac Monitoring: / EKG:  The patient was maintained on a cardiac monitor.  I personally viewed and interpreted the cardiac monitored which  showed an underlying rhythm of: Sinus rhythm, PVC, IVCD   Problem List / ED Course / Critical interventions / Medication management   I ordered medication including ***   Reevaluation of the patient after these medicines showed that the patient *** I have reviewed the patients home medicines and have made adjustments as needed   Consultations Obtained:  I requested consultation with the hospitalist,  and discussed lab and imaging findings as well as pertinent plan - they recommend: ***   Social Determinants of Health:  Patient is a former smoker   Test / Admission - Considered:  ***   {Document critical care time when appropriate:1} {Document review of labs and clinical decision tools ie heart score, Chads2Vasc2 etc:1}  {Document your independent review of radiology images, and any outside records:1} {Document your discussion with family members, caretakers, and with consultants:1} {Document social determinants of health affecting pt's care:1} {Document your decision making why or why not admission, treatments were needed:1} Final Clinical Impression(s) / ED Diagnoses Final diagnoses:  None    Rx / DC Orders ED Discharge Orders     None

## 2023-06-24 NOTE — ED Triage Notes (Signed)
 Pt BIB EMS from w/ chest pain. Pt reporting occasional CP throughout the day w/ some SOB associated w/ the pain. Pt currently denies CP or SOB at this time. Hx of MI.

## 2023-06-25 DIAGNOSIS — R079 Chest pain, unspecified: Secondary | ICD-10-CM | POA: Diagnosis not present

## 2023-06-25 DIAGNOSIS — I2510 Atherosclerotic heart disease of native coronary artery without angina pectoris: Secondary | ICD-10-CM | POA: Diagnosis not present

## 2023-06-25 DIAGNOSIS — I10 Essential (primary) hypertension: Secondary | ICD-10-CM | POA: Diagnosis not present

## 2023-06-25 DIAGNOSIS — Z9861 Coronary angioplasty status: Secondary | ICD-10-CM

## 2023-06-25 LAB — BASIC METABOLIC PANEL WITH GFR
Anion gap: 6 (ref 5–15)
BUN: 22 mg/dL (ref 8–23)
CO2: 25 mmol/L (ref 22–32)
Calcium: 8.2 mg/dL — ABNORMAL LOW (ref 8.9–10.3)
Chloride: 107 mmol/L (ref 98–111)
Creatinine, Ser: 1.6 mg/dL — ABNORMAL HIGH (ref 0.61–1.24)
GFR, Estimated: 42 mL/min — ABNORMAL LOW (ref >60)
Glucose, Bld: 101 mg/dL — ABNORMAL HIGH (ref 70–99)
Potassium: 4.3 mmol/L (ref 3.5–5.1)
Sodium: 138 mmol/L (ref 135–145)

## 2023-06-25 LAB — CBC
HCT: 38.2 % — ABNORMAL LOW (ref 39.0–52.0)
Hemoglobin: 13.1 g/dL (ref 13.0–17.0)
MCH: 31.8 pg (ref 26.0–34.0)
MCHC: 34.3 g/dL (ref 30.0–36.0)
MCV: 92.7 fL (ref 80.0–100.0)
Platelets: 168 10*3/uL (ref 150–400)
RBC: 4.12 MIL/uL — ABNORMAL LOW (ref 4.22–5.81)
RDW: 13 % (ref 11.5–15.5)
WBC: 5 10*3/uL (ref 4.0–10.5)
nRBC: 0 % (ref 0.0–0.2)

## 2023-06-25 LAB — LIPID PANEL
Cholesterol: 105 mg/dL (ref 0–200)
HDL: 29 mg/dL — ABNORMAL LOW (ref >40)
LDL Cholesterol: 45 mg/dL (ref 0–99)
Total CHOL/HDL Ratio: 3.6 ratio
Triglycerides: 154 mg/dL — ABNORMAL HIGH (ref <150)
VLDL: 31 mg/dL (ref 0–40)

## 2023-06-25 LAB — TROPONIN I (HIGH SENSITIVITY): Troponin I (High Sensitivity): 15 ng/L (ref <18)

## 2023-06-25 MED ORDER — LORAZEPAM 1 MG PO TABS: 0.5000 mg | ORAL_TABLET | Freq: Every day | ORAL | Status: DC | PRN

## 2023-06-25 MED ORDER — CLOPIDOGREL BISULFATE 75 MG PO TABS
75.0000 mg | ORAL_TABLET | Freq: Every day | ORAL | Status: DC
  Administered 2023-06-25: 75 mg via ORAL
  Filled 2023-06-25: qty 1

## 2023-06-25 MED ORDER — NITROGLYCERIN 0.4 MG SL SUBL: 0.4000 mg | SUBLINGUAL_TABLET | SUBLINGUAL | Status: DC | PRN

## 2023-06-25 MED ORDER — PAROXETINE HCL 30 MG PO TABS: 30.0000 mg | ORAL_TABLET | Freq: Every day | ORAL | Status: DC

## 2023-06-25 MED ORDER — PANTOPRAZOLE SODIUM 40 MG PO TBEC: 40.0000 mg | DELAYED_RELEASE_TABLET | Freq: Two times a day (BID) | ORAL | Status: AC

## 2023-06-25 MED ORDER — METOPROLOL TARTRATE 50 MG PO TABS: 25.0000 mg | ORAL_TABLET | Freq: Two times a day (BID) | ORAL | Status: AC

## 2023-06-25 MED ORDER — ISOSORBIDE MONONITRATE ER 30 MG PO TB24
60.0000 mg | ORAL_TABLET | Freq: Every day | ORAL | Status: DC
  Administered 2023-06-25: 60 mg via ORAL
  Filled 2023-06-25: qty 2

## 2023-06-25 MED ORDER — ACETAMINOPHEN 325 MG PO TABS
650.0000 mg | ORAL_TABLET | ORAL | Status: DC | PRN
Start: 2023-06-25 — End: 2023-06-25

## 2023-06-25 MED ORDER — ASPIRIN 300 MG RE SUPP: 300.0000 mg | RECTAL | Status: DC

## 2023-06-25 MED ORDER — SODIUM CHLORIDE 0.9 % IV BOLUS
500.0000 mL | Freq: Once | INTRAVENOUS | Status: AC
  Administered 2023-06-25: 500 mL via INTRAVENOUS

## 2023-06-25 MED ORDER — METOPROLOL TARTRATE 25 MG PO TABS
25.0000 mg | ORAL_TABLET | Freq: Two times a day (BID) | ORAL | Status: DC
  Administered 2023-06-25: 25 mg via ORAL
  Filled 2023-06-25: qty 1

## 2023-06-25 MED ORDER — ASPIRIN 81 MG PO TBEC: 81.0000 mg | DELAYED_RELEASE_TABLET | Freq: Every day | ORAL | Status: DC

## 2023-06-25 MED ORDER — AMLODIPINE BESYLATE 5 MG PO TABS
10.0000 mg | ORAL_TABLET | Freq: Every day | ORAL | Status: DC
  Administered 2023-06-25: 10 mg via ORAL
  Filled 2023-06-25: qty 2

## 2023-06-25 MED ORDER — ISOSORBIDE MONONITRATE ER 60 MG PO TB24: 60.0000 mg | ORAL_TABLET | Freq: Every day | ORAL | 0 refills | Status: AC

## 2023-06-25 MED ORDER — LOSARTAN POTASSIUM 50 MG PO TABS
50.0000 mg | ORAL_TABLET | Freq: Every day | ORAL | Status: DC
  Administered 2023-06-25: 50 mg via ORAL
  Filled 2023-06-25: qty 1

## 2023-06-25 MED ORDER — EVOLOCUMAB 140 MG/ML ~~LOC~~ SOAJ: 140.0000 mg | SUBCUTANEOUS | Status: DC

## 2023-06-25 MED ORDER — PANTOPRAZOLE SODIUM 40 MG PO TBEC
40.0000 mg | DELAYED_RELEASE_TABLET | Freq: Two times a day (BID) | ORAL | Status: DC
  Administered 2023-06-25: 40 mg via ORAL
  Filled 2023-06-25: qty 1

## 2023-06-25 MED ORDER — HEPARIN SODIUM (PORCINE) 5000 UNIT/ML IJ SOLN: 5000.0000 [IU] | Freq: Three times a day (TID) | INTRAMUSCULAR | Status: DC

## 2023-06-25 MED ORDER — ISOSORBIDE MONONITRATE ER 30 MG PO TB24: 30.0000 mg | ORAL_TABLET | Freq: Every day | ORAL | Status: DC

## 2023-06-25 MED ORDER — ASPIRIN 81 MG PO CHEW: 324.0000 mg | CHEWABLE_TABLET | ORAL | Status: DC

## 2023-06-25 MED ORDER — ONDANSETRON HCL 4 MG/2ML IJ SOLN: 4.0000 mg | Freq: Four times a day (QID) | INTRAMUSCULAR | Status: DC | PRN

## 2023-06-25 NOTE — Consult Note (Signed)
 Cardiology Consultation   Patient ID: JUNIOUS RAGONE MRN: [id number]; DOB: 17-Jun-1940  Admit date: 06/24/2023 Date of Consult: 06/25/2023  PCP:  Patient, No Pcp Per   [facility] Providers Cardiologist:  Alexandria Angel, MD        Patient Profile: [name] [name] is a 83 y.o. male with a hx of CAD s/p CABG 2001 (LIMA- LAD, radial graft to ramus) s/p complex PCI 02/2022 PCI Ostial/proximal LMCA, widely patent LIMA to LAD graft, patent sequential left radial to D1 graft, jump segment from D1 to OM is chronically occluded, hypertension, hyperlipidemia, carotid stenosis.  To who is being seen 06/25/2023 for the evaluation of chest pain at the request of Dr Donata Fryer.  History of Present Illness: DAYLON LAFAVOR is a 83 y.o. male with a hx of CAD s/p CABG 2001 (LIMA- LAD, radial graft to ramus) s/p complex PCI 02/2022 PCI Ostial/proximal LMCA, widely patent LIMA to LAD graft, patent sequential left radial to D1 graft, jump segment from D1 to OM is chronically occluded, hypertension, hyperlipidemia, carotid stenosis.  To who is being seen 06/25/2023 for the evaluation of chest pain   Patient is a poor historian.,  Had chest pain which occurred at rest and exertion, reports on and off symptoms did not call his doctor took some nitroglycerin  with some, ongoing for couple days, chest pain got worse so came to the ER. Troponin checked x 2 negative,EKG shows sinus rhythm with PVC and nonspecific ST-T wave changes. Compared to 11/2022 had nonspecific IVCD and nonspecific ST-T wave changes. Had [facility]TE at [facility] 03/2023 EF 55 to 60% . Stress test/Lexiscan  04/19/2023 no ischemia, normal gated images, EF 59%.  Fixed defect involving inferior wall most likely due to diaphragmatic dictating admission  Cardiac catheterization 08/2017 showed 60% RCA, 100% LAD, 95% left main, 95% circumflex, patent radial graft-ramus, patent LIMA-LAD, underwent PCI of his left main and circumflex.  left heart cath on 03/06/2022.  He was noted to have multivessel CAD with 70% ostial LMCA stenosis, CTO ostial LAD, 60% in-stent restenosis proximal/mid circumflex, and 60% proximal/mid RCA. He received PCI with DES to his LMCA and was placed on dual antiplatelet therapy.   Echocardiogram 03/06/2022   1. Left ventricular ejection fraction, by estimation, is 60 to 65%. The  left ventricle has normal function. The left ventricle has no regional  wall motion abnormalities. There is moderate concentric left ventricular  hypertrophy. Left ventricular  diastolic parameters are indeterminate.   2. Right ventricular systolic function is normal. The right ventricular  size is normal. Tricuspid regurgitation signal is inadequate for assessing  PA pressure.   3. Left atrial size was mildly dilated.   4. The mitral valve is grossly normal. Trivial mitral valve  regurgitation. No evidence of mitral stenosis.   5. The aortic valve is tricuspid. There is mild calcification of the  aortic valve. There is mild thickening of the aortic valve. Aortic valve  regurgitation is trivial. No aortic stenosis is present.   6. The inferior vena cava is normal in size with greater than 50%  respiratory variability, suggesting right atrial pressure of 3 mmHg.   Comparison(s): No significant change from prior study.    Left heart cath 03/06/2022   Diagnostic Dominance: Right  Intervention    Conclusions: Multivessel CAD, including 70% ostial LMCA stenosis, CTO ostial LAD, 60% ISR proximal/mid LCx, and 60% proximal/mid RCA. Patent distal LMCA and LCx stents with 60% ISR in proximal/mid LCx just distal to takeoff of OM1.  Widely patent LIMA-LAD graft. Patent sequential left radial-D1 graft; jump segment from D1 to OM is chronically occluded. Mildly elevated LVEDP. Successful PCI to ostial/proximal LMCA using Synergy 3.0 x 12 mm drug-eluting stent (post-dilated to 3.3 mm) with 0% residual stenosis and TIMI-3 flow.   Recommendations: Dual  antiplatelet therapy with aspirin  and clopidogrel  for at least 12 months, ideally longer Aggressive secondary prevention. Remove right femoral artery sheath two hours after discontinuation of bivalirudin .    Past Medical History:  Diagnosis Date   Anemia 10/06/2011   Anginal pain (HCC)    Arthritis    "joints hurt; shoulders, arms, back" (08/13/2013)   Atrial fibrillation (HCC)    B12 deficiency    Carotid stenosis 04/10/2011   a. s/p left carotid endarterectomy 04/14/2011.;  b.  Carotid US  (11/13):  L CEA ok; RICA 1-39%   Chronic bronchitis (HCC)    "get it q yr"   Chronic chest pain    Chronic lower back pain    Colon polyp    adenomatous   Coronary artery disease    a. S/p CABG in 2001. b. S/p DES to protected LM and BMS to RCA 2006. c. 12/2009: s/p DES to Cx;  d. 09/2011 Cath: patent stents, patent grafts -->Med Rx.;  e. CP with abnl Nuc => LHC 10/04/12: oLM 30%, dLM stent into the CFX patent, LAD occluded, LIMA-LAD patent, RI 30%, mid AVCFX 30%, oOM1 50%, oRCA stent patent, mRCA 50-60%, Radial graft-Dx patent; EF 55%.=> Med Rx.  f. stent to LM & Circ,   Daily headache    "for the last couple weeks" (08/13/2013)   Diverticulosis    Esophageal stricture    GERD (gastroesophageal reflux disease)    Hemorrhoids    Hiatal hernia    Hyperlipidemia    Hypertension    Left bundle branch block    Memory deficit    Pancreatitis 12/2009   ERCP ok.   Prostate cancer [facility])    s/p cryoablation   Renal cyst    Seen on CT 08/2011 also with circumferential bladder wall thickening   Skin cancer    "cut it off my right ear"   Statin intolerance     Past Surgical History:  Procedure Laterality Date   CARDIAC CATHETERIZATION     CHOLECYSTECTOMY     COLONOSCOPY     CORONARY ANGIOPLASTY WITH STENT PLACEMENT     "1 + 1"   CORONARY ARTERY BYPASS GRAFT  2001   CORONARY STENT INTERVENTION N/A 09/11/2017   Procedure: CORONARY STENT INTERVENTION;  Surgeon: Avanell Leigh, MD;  Location: [facility]  INVASIVE CV LAB;  Service: Cardiovascular;  Laterality: N/A;   CORONARY STENT INTERVENTION N/A 03/06/2022   Procedure: CORONARY STENT INTERVENTION;  Surgeon: Sammy Crisp, MD;  Location: [facility] INVASIVE CV LAB;  Service: Cardiovascular;  Laterality: N/A;   CORONARY STENT PLACEMENT  09/11/2017   Ost Cx to Prox Cx lesion is 95% stenosed.   ENDARTERECTOMY  04/14/2011   Procedure: ENDARTERECTOMY CAROTID;  Surgeon: Palma Bob, MD;  Location: [facility] OR;  Service: Vascular;  Laterality: Left;  Would like to perform procedure first, at 0730   EYE SURGERY     cataract   INGUINAL HERNIA REPAIR Bilateral    LEFT HEART CATH AND CORONARY ANGIOGRAPHY N/A 03/06/2022   Procedure: LEFT HEART CATH AND CORONARY ANGIOGRAPHY;  Surgeon: Sammy Crisp, MD;  Location: [facility] INVASIVE CV LAB;  Service: Cardiovascular;  Laterality: N/A;   LEFT HEART CATH AND CORS/GRAFTS ANGIOGRAPHY N/A 09/11/2017  Procedure: LEFT HEART CATH AND CORS/GRAFTS ANGIOGRAPHY;  Surgeon: Avanell Leigh, MD;  Location: [facility] INVASIVE CV LAB;  Service: Cardiovascular;  Laterality: N/A;   LEFT HEART CATHETERIZATION WITH CORONARY/GRAFT ANGIOGRAM N/A 10/05/2011   Procedure: LEFT HEART CATHETERIZATION WITH Estella Helling;  Surgeon: Kristopher Pheasant, MD;  Location: [facility] CATH LAB;  Service: Cardiovascular;  Laterality: N/A;   LEFT HEART CATHETERIZATION WITH CORONARY/GRAFT ANGIOGRAM N/A 10/04/2012   Procedure: LEFT HEART CATHETERIZATION WITH Estella Helling;  Surgeon: Mardell Shade, MD;  Location: [facility] CATH LAB;  Service: Cardiovascular;  Laterality: N/A;   POLYPECTOMY     PR VEIN BYPASS GRAFT,AORTO-FEM-POP     PROSTATE CRYOABLATION         Scheduled Meds:  amLODipine   10 mg Oral Daily   aspirin   324 mg Oral NOW   Or   aspirin   300 mg Rectal NOW   aspirin  EC  81 mg Oral QHS   clopidogrel   75 mg Oral Q breakfast   Evolocumab   140 mg Subcutaneous Q14 Days   heparin   5,000 Units Subcutaneous Q8H   isosorbide  mononitrate  30 mg  Oral Daily   losartan   50 mg Oral Daily   metoprolol  tartrate  25 mg Oral BID   pantoprazole   40 mg Oral BID AC   PARoxetine   30 mg Oral QHS   Continuous Infusions:  PRN Meds: acetaminophen , LORazepam , nitroGLYCERIN , ondansetron  (ZOFRAN ) IV  Allergies:    Allergies  Allergen Reactions   Shellfish Allergy Anaphylaxis   Zolpidem Tartrate Other (See Comments)    Hallucinations    Zolpidem Tartrate Other (See Comments)    Hallucinations   Brilinta  [Ticagrelor ] Other (See Comments)    Reaction not recalled   Amoxicillin-Pot Clavulanate Other (See Comments)    "Burns' Stomach"   Atorvastatin  Other (See Comments)    "Made the legs hurt"   Codeine Nausea And Vomiting   Erythromycin Diarrhea, Nausea And Vomiting and Other (See Comments)    All -mycins cause upset stomach   Hydrochlorothiazide Other (See Comments)    Reaction not recalled   Hydrocodone  Nausea And Vomiting   Morphine  Nausea And Vomiting   Nitrofuran Derivatives Other (See Comments)    Reaction niot recalled   Oxycodone Hcl Nausea And Vomiting   Tramadol Nausea And Vomiting   Zocor [Simvastatin] Other (See Comments)    Muscle pain/soreness    Social History:   Social History   Socioeconomic History   Marital status: Widowed    Spouse name: Not on file   Number of children: 2   Years of education: Not on file   Highest education level: Not on file  Occupational History   Occupation: Retired    Comment: raised Programme researcher, broadcasting/film/video  Tobacco Use   Smoking status: Former    Types: Cigarettes   Smokeless tobacco: Current    Types: Chew   Tobacco comments:    "smoked a few cigarettes; no more than 1 month"  Vaping Use   Vaping status: Never Used  Substance and Sexual Activity   Alcohol use: No    Alcohol/week: 0.0 standard drinks of alcohol    Comment: "no alcohol since I was a teenager"   Drug use: No   Sexual activity: Not Currently  Other Topics Concern   Not on file  Social History Narrative   Patient is  illiterate. He cannot read or write. He left school at about the seventh grade.   As of 10/2015 he reports that his wife is chronically ill at  home with heart disease and COPD and is under hospice care. The bulk of the care is given by the patient and daughter.   Social Drivers of Corporate investment banker Strain: Not on file  Food Insecurity: Not on file  Transportation Needs: Not on file  Physical Activity: Not on file  Stress: Not on file  Social Connections: Not on file  Intimate Partner Violence: Not on file    Family History:    Family History  Problem Relation Age of Onset   Heart disease Mother        Heart Disease before age 70   Hypertension Mother    Heart attack Mother    Lung cancer Father    Hypertension Sister    Heart disease Sister        Heart Disease before age 71   Cancer Sister    Heart attack Sister    Hypertension Brother    Heart disease Brother        Heart Disease before age 38   Heart attack Brother    Colon cancer Neg Hx    Esophageal cancer Neg Hx    Pancreatic cancer Neg Hx    Stomach cancer Neg Hx      ROS:  Please see the history of present illness.   All other ROS reviewed and negative.     Physical Exam/Data: Vitals:   06/25/23 0300 06/25/23 0330 06/25/23 0400 06/25/23 0505  BP: (!) 116/40 (!) 116/49 (!) 127/51   Pulse: (!) 51 (!) 53 60   Resp: 14 15 (!) 22   Temp:    97.9 F (36.6 C)  TempSrc:    Oral  SpO2: 97% 93% 95%   Weight:      Height:       No intake or output data in the 24 hours ending 06/25/23 0508    06/24/2023   10:09 PM 05/29/2023    8:22 AM 04/27/2023    2:24 PM  Last 3 Weights  Weight (lbs) 142 lb 140 lb 139 lb  Weight (kg) 64.411 kg 63.504 kg 63.05 kg     Body mass index is 27.73 kg/m.  General:  Well nourished, well developed, in no acute distress HEENT: normal Neck: no JVD Vascular: No carotid bruits; Distal pulses 2+ bilaterally Cardiac:  normal S1, S2; RRR; no murmur  Lungs:  clear to  auscultation bilaterally, no wheezing, rhonchi or rales  Abd: soft, nontender, no hepatomegaly  Ext: no edema Musculoskeletal:  No deformities, BUE and BLE strength normal and equal Skin: warm and dry  Neuro:  CNs 2-12 intact, no focal abnormalities noted Psych:  Normal affect   Laboratory Data: High Sensitivity Troponin:   Recent Labs  Lab 06/24/23 2218 06/25/23 0018  TROPONINIHS 14 15     Chemistry Recent Labs  Lab 06/24/23 2218  NA 137  K 4.7  CL 105  CO2 25  GLUCOSE 102*  BUN 24*  CREATININE 1.73*  CALCIUM  8.4*  GFRNONAA 39*  ANIONGAP 7    No results for input(s): "PROT", "ALBUMIN", "AST", "ALT", "ALKPHOS", "BILITOT" in the last 168 hours. Lipids No results for input(s): "CHOL", "TRIG", "HDL", "LABVLDL", "LDLCALC", "CHOLHDL" in the last 168 hours.  Hematology Recent Labs  Lab 06/24/23 2218  WBC 6.0  RBC 4.09*  HGB 13.0  HCT 38.1*  MCV 93.2  MCH 31.8  MCHC 34.1  RDW 12.7  PLT 182   Thyroid  No results for input(s): "TSH", "FREET4" in the last  168 hours.  BNPNo results for input(s): "BNP", "PROBNP" in the last 168 hours.  DDimer No results for input(s): "DDIMER" in the last 168 hours.  Radiology/Studies:  DG Chest 2 View Result Date: 06/24/2023 CLINICAL DATA:  Chest pain a EXAM: CHEST - 2 VIEW COMPARISON:  Chest x-ray 04/17/2023 FINDINGS: The heart is enlarged. The lungs are clear. There is no pleural effusion or pneumothorax. Patient is status post cardiac surgery. No acute fractures are seen. IMPRESSION: Cardiomegaly. No acute cardiopulmonary process. Electronically Signed   By: Tyron Gallon M.D.   On: 06/24/2023 22:39     Assessment and Plan: Chest pain, atypical, negative enzymes, nonspecific ST-T wave changes on EKG. CAD status post CABG 2001 status post complex PCI 02/2022 with PCI to proximal/ostial LMCA with DES, has patent LIMA to LAD, patent radial to D1 graft, jump segment from V1 to OM is occluded Hypertension, hyperlipidemia, carotid stenosis,  multiple other comorbidities as noted  Plan: -> Recent nuclear stress test in March was negative for any ischemia, EF is preserved, troponins are negative, EKG shows nonspecific ST-T wave changes.  I would recommend continue medical management DAPT therapy, Imdur  Repatha , metoprolol  amlodipine .  I will increase Imdur  to 60 mg daily Currently he is stable reports mild chest pain did not improve much with SL nitroglycerin  on arrival.  But he is hemodynamically stable Will follow him in a.m.  Risk Assessment/Risk Scores:              For questions or updates, please contact [facility] Please consult [email or web address] for contact info under    Signed, Cranston Dk, MD  06/25/2023 5:08 AM

## 2023-06-25 NOTE — Progress Notes (Signed)
 Brief cardiology follow up note:  Reviewed note from Dr. Jeryl Moris this morning, personally reviewed history and workup. In summary, 83 yo man with prior CABG in 2011 with complex PCI 02/2022 who is admitted for chest pain.   I discussed with patient, difficult to get clear history of onset/quality/duration of pain. His workup has been reassuring, with hsTn unremarkable. Had recent echo and stress test about two months ago which were also reassuring. These were performed at [facility] and reviewed as scanned documents.  LDL well controlled at 45, TG borderline at 154 (within his typical range), HDL low at 29.   His imdur  was increased this admission--he has overall had stable blood pressures except for a dip overnight.   No events on telemetry, and he denies current pressure (cannot tell me when he last had pressure).  If he gets up and walks without lightheadedness (given the overnight low BP), I think he is ok for discharge with the only change being an increase on the imdur  dose. He already has follow up scheduled with Dr. Audery Blazing on 07/24/23.  Communicated recommendations to Dr. Maury Space.  Sheryle Donning, MD, PhD, [facility]  [facility]  [place]  [facility] at [facility] [address], [address] [place], [place] [address] [phone]

## 2023-06-25 NOTE — H&P (Signed)
 History and Physical    Patient: [name] [name] WUX:[id number] DOB: November 28, 1940 DOA: 06/24/2023 DOS: the patient was seen and examined on 06/25/2023 PCP: Patient, No Pcp Per  Patient coming from: Home  Chief Complaint:  Chief Complaint  Patient presents with   Chest Pain   HPI:  Patient is a poor historian. He was able to give me some of the history but limited with time lines. No family at this time at bedside  [name] [name] is a 83 y.o. male with medical history significant of coronary artery disease status post CABG.Subsequent left heart cath in 2024 required PCI stenting to in February 2024 to ostial/proximal LMCA.Patient was prescribed DAPT.His other comorbidities include atrial fibrillation, chronic kidney disease, chronic anemia, hyperlipidemia, GERD.  He presents from home on account of complaints of substernal chest pain.  Symptoms occurred at rest and with exertion.  He was scored 6 on the heart score and was referred to the hospitalist team for admission and further evaluation by cardiology.  Patient apparently recently had a myocardial perfusion imaging study done at an OSH.  Results unavailable on record.  Cardiac markers done on this admission were unremarkable and flat.  EKG reviewed shows no obvious changes compared to prior.  Early repolarization changes noted.  Patient at this time remains chest pain-free.  Denies any nausea, vomiting or palpitation. Review of Systems: As mentioned in the history of present illness. All other systems reviewed and are negative. Past Medical History:  Diagnosis Date   Anemia 10/06/2011   Anginal pain (HCC)    Arthritis    "joints hurt; shoulders, arms, back" (08/13/2013)   Atrial fibrillation (HCC)    B12 deficiency    Carotid stenosis 04/10/2011   a. s/p left carotid endarterectomy 04/14/2011.;  b.  Carotid US  (11/13):  L CEA ok; RICA 1-39%   Chronic bronchitis (HCC)    "get it q yr"   Chronic chest pain    Chronic lower back pain     Colon polyp    adenomatous   Coronary artery disease    a. S/p CABG in 2001. b. S/p DES to protected LM and BMS to RCA 2006. c. 12/2009: s/p DES to Cx;  d. 09/2011 Cath: patent stents, patent grafts -->Med Rx.;  e. CP with abnl Nuc => LHC 10/04/12: oLM 30%, dLM stent into the CFX patent, LAD occluded, LIMA-LAD patent, RI 30%, mid AVCFX 30%, oOM1 50%, oRCA stent patent, mRCA 50-60%, Radial graft-Dx patent; EF 55%.=> Med Rx.  f. stent to LM & Circ,   Daily headache    "for the last couple weeks" (08/13/2013)   Diverticulosis    Esophageal stricture    GERD (gastroesophageal reflux disease)    Hemorrhoids    Hiatal hernia    Hyperlipidemia    Hypertension    Left bundle branch block    Memory deficit    Pancreatitis 12/2009   ERCP ok.   Prostate cancer [facility]    s/p cryoablation   Renal cyst    Seen on CT 08/2011 also with circumferential bladder wall thickening   Skin cancer    "cut it off my right ear"   Statin intolerance    Past Surgical History:  Procedure Laterality Date   CARDIAC CATHETERIZATION     CHOLECYSTECTOMY     COLONOSCOPY     CORONARY ANGIOPLASTY WITH STENT PLACEMENT     "1 + 1"   CORONARY ARTERY BYPASS GRAFT  2001   CORONARY STENT INTERVENTION N/A  09/11/2017   Procedure: CORONARY STENT INTERVENTION;  Surgeon: Avanell Leigh, MD;  Location: [facility] INVASIVE CV LAB;  Service: Cardiovascular;  Laterality: N/A;   CORONARY STENT INTERVENTION N/A 03/06/2022   Procedure: CORONARY STENT INTERVENTION;  Surgeon: Sammy Crisp, MD;  Location: [facility] INVASIVE CV LAB;  Service: Cardiovascular;  Laterality: N/A;   CORONARY STENT PLACEMENT  09/11/2017   Ost Cx to Prox Cx lesion is 95% stenosed.   ENDARTERECTOMY  04/14/2011   Procedure: ENDARTERECTOMY CAROTID;  Surgeon: Palma Bob, MD;  Location: [facility] OR;  Service: Vascular;  Laterality: Left;  Would like to perform procedure first, at 0730   EYE SURGERY     cataract   INGUINAL HERNIA REPAIR Bilateral    LEFT HEART CATH AND CORONARY  ANGIOGRAPHY N/A 03/06/2022   Procedure: LEFT HEART CATH AND CORONARY ANGIOGRAPHY;  Surgeon: Sammy Crisp, MD;  Location: [facility] INVASIVE CV LAB;  Service: Cardiovascular;  Laterality: N/A;   LEFT HEART CATH AND CORS/GRAFTS ANGIOGRAPHY N/A 09/11/2017   Procedure: LEFT HEART CATH AND CORS/GRAFTS ANGIOGRAPHY;  Surgeon: Avanell Leigh, MD;  Location: [facility] INVASIVE CV LAB;  Service: Cardiovascular;  Laterality: N/A;   LEFT HEART CATHETERIZATION WITH CORONARY/GRAFT ANGIOGRAM N/A 10/05/2011   Procedure: LEFT HEART CATHETERIZATION WITH Estella Helling;  Surgeon: Kristopher Pheasant, MD;  Location: [facility] CATH LAB;  Service: Cardiovascular;  Laterality: N/A;   LEFT HEART CATHETERIZATION WITH CORONARY/GRAFT ANGIOGRAM N/A 10/04/2012   Procedure: LEFT HEART CATHETERIZATION WITH Estella Helling;  Surgeon: Mardell Shade, MD;  Location: [facility] CATH LAB;  Service: Cardiovascular;  Laterality: N/A;   POLYPECTOMY     PR VEIN BYPASS GRAFT,AORTO-FEM-POP     PROSTATE CRYOABLATION     Social History:  reports that he has quit smoking. His smoking use included cigarettes. His smokeless tobacco use includes chew. He reports that he does not drink alcohol and does not use drugs.  Allergies  Allergen Reactions   Shellfish Allergy Anaphylaxis   Zolpidem Tartrate Other (See Comments)    Hallucinations    Zolpidem Tartrate Other (See Comments)    Hallucinations   Brilinta  [Ticagrelor ] Other (See Comments)    Reaction not recalled   Amoxicillin-Pot Clavulanate Other (See Comments)    "Burns' Stomach"   Atorvastatin  Other (See Comments)    "Made the legs hurt"   Codeine Nausea And Vomiting   Erythromycin Diarrhea, Nausea And Vomiting and Other (See Comments)    All -mycins cause upset stomach   Hydrochlorothiazide Other (See Comments)    Reaction not recalled   Hydrocodone  Nausea And Vomiting   Morphine  Nausea And Vomiting   Nitrofuran Derivatives Other (See Comments)    Reaction niot recalled    Oxycodone Hcl Nausea And Vomiting   Tramadol Nausea And Vomiting   Zocor [Simvastatin] Other (See Comments)    Muscle pain/soreness    Family History  Problem Relation Age of Onset   Heart disease Mother        Heart Disease before age 25   Hypertension Mother    Heart attack Mother    Lung cancer Father    Hypertension Sister    Heart disease Sister        Heart Disease before age 60   Cancer Sister    Heart attack Sister    Hypertension Brother    Heart disease Brother        Heart Disease before age 65   Heart attack Brother    Colon cancer Neg Hx    Esophageal cancer  Neg Hx    Pancreatic cancer Neg Hx    Stomach cancer Neg Hx     Prior to Admission medications   Medication Sig Start Date End Date Taking? Authorizing Provider  amLODipine  (NORVASC ) 10 MG tablet Take 10 mg by mouth daily.    [provider]  aspirin  EC 81 MG tablet Take 81 mg by mouth at bedtime.     [provider]  clopidogrel  (PLAVIX ) 75 MG tablet TAKE 1 TABLET BY MOUTH DAILY WITH BREAKFAST. 03/30/23   Lenise Quince, MD  Evolocumab  (REPATHA  SURECLICK) 140 MG/ML SOAJ INJECT 140 MG INTO THE SKIN EVERY 14 (FOURTEEN) DAYS. 05/01/23   Carie Charity, NP  ezetimibe  (ZETIA ) 10 MG tablet Take 1 tablet (10 mg total) by mouth daily. Patient not taking: Reported on 05/29/2023 03/08/22   Zhao, Xika, NP  GAS-X EXTRA STRENGTH 125 MG chewable tablet Chew 125 mg by mouth 3 (three) times daily with meals.    [provider]  isosorbide  mononitrate (IMDUR ) 30 MG 24 hr tablet TAKE 1 TABLET (30 MG TOTAL) BY MOUTH DAILY. ** DO NOT CRUSH ** 04/27/23   Lenise Quince, MD  LORazepam  (ATIVAN ) 0.5 MG tablet Take 0.5 mg by mouth daily as needed for anxiety. 07/17/16   [provider]  losartan  (COZAAR ) 50 MG tablet TAKE 1 TABLET BY MOUTH EVERY DAY 01/23/23   Carie Charity, NP  metoprolol  tartrate (LOPRESSOR ) 25 MG tablet Take 1 tablet (25 mg total) by mouth 2 (two) times daily. Patient taking  differently: Take 25 mg by mouth 2 (two) times daily. Per patient taking twice a day 03/07/22   Zhao, Xika, NP  nitroGLYCERIN  (NITROSTAT ) 0.4 MG SL tablet Place 1 tablet (0.4 mg total) under the tongue every 5 (five) minutes as needed. For chest pain Patient taking differently: Place 0.4 mg under the tongue every 5 (five) minutes as needed for chest pain. 09/13/17   Lenise Quince, MD  pantoprazole  (PROTONIX ) 40 MG tablet Take 1 tablet (40 mg total) by mouth daily. Patient taking differently: Take 40 mg by mouth 2 (two) times daily before a meal. 09/12/17   Mariah Shines, NP  PARoxetine  (PAXIL ) 20 MG tablet Take 20 mg by mouth at bedtime.  04/18/17   [provider]  sucralfate  (CARAFATE ) 1 g tablet Take 1 g by mouth 3 (three) times daily. Patient not taking: Reported on 05/29/2023 02/21/22   [provider]    Physical Exam: Vitals:   06/24/23 2208 06/24/23 2209 06/24/23 2330  BP: (!) 141/64  (!) 130/48  Pulse: (!) 58  (!) 54  Resp: 16  14  SpO2: 98%  96%  Weight:  64.4 kg   Height:  5' (1.524 m)    General: Patient is a pleasant looking gentleman.  Close any acute distress.  He was sleeping comfortably.  He was easily arousable. HEENT: Oral mucosa moist. Neck: Supple with no JVD Chest: Clinically diminished but clear.  Midline incision scar suggesting prior history of CABG. Cardiovascular: S1-S2 with no murmur Abdomen: Soft nontender with no organomegaly Extremities without pedal edema Skin negative for rash.  Data Reviewed: Sodium 137, potassium 4.7, chloride 105, bicarb 25, glucose 102, BUN 24, creatinine 1.73, calcium  8.4.  Troponin 15, and 14 respectively.  WBC 6, hemoglobin 13, hematocrit 38, platelet count 182 neutrophil 58,  EKG personally reviewed by me: Sinus rhythm, slightly bradycardic below 60.  Early repolarization changes noted.  No obvious changes compared to prior.  Chest x-ray  shows cardiomegaly with no acute cardiopulmonary process.  Recent  echocardiogram from 04/18/2023   Assessment and Plan:  83 year old male with history of coronary artery disease status post CABG.  He is status post left heart catheterization in 02/2018 for with findings of obstructive coronary disease that required PCI.  He was prescribed with dual antiplatelet therapy.  Unclear if patient has been compliant.  He presents to the emergency room on account of substernal chest pain.  Cardiac markers remain flat.  EKG unremarkable.  Heart score was scored at 6.  Patient will be admitted under observation.  Cardiology will be consulted to help optimize treatment and management.  He status post recent myocardial perfusion study and echocardiogram.  Results are not available on file.   #Acute on chronic chest pain: Known history of multivessel coronary disease.  Differentials include unstable angina. Patient is on aspirin  and Plavix .  He is also on Imdur .  Will continue with same.  Continue with beta-blockers and statins.  His lipoprotein a levels were high in 02/2022.  Cardiology will be consulted to evaluate and advise of further restratification.  He status post recent myocardial perfusion studies in March 2025.  Patient apparently developed chest pain with Lexiscan  infusion.  Symptoms resolved with sublingual nitro.  MPS studies were reported to be unremarkable for ischemia. Echocardiogram also revealed normal ejection fraction with wall motion abnormalities.  #2.  History of GERD.  Patient is on PPI and sucralfate  Per med records.  Will continue with same.  #3.  Essential hypertension: Blood pressure stable on this admission.  Continue with home medication.  #4 CKD stage IIIb.  Renal function looks slightly worse but stable.  #5.  Hyperlipidemia: He is on Repatha   #6.  History of depression on paroxetine .  History of anxiety disorder on lorazepam .   Advance Care Planning:   Code Status: Prior   Consults: Cardiology will be consulted  Family Communication: No  family at bedside at this time.  Severity of Illness: The appropriate patient status for this patient is OBSERVATION. Observation status is judged to be reasonable and necessary in order to provide the required intensity of service to ensure the patient's safety. The patient's presenting symptoms, physical exam findings, and initial radiographic and laboratory data in the context of their medical condition is felt to place them at decreased risk for further clinical deterioration. Furthermore, it is anticipated that the patient will be medically stable for discharge from the hospital within 2 midnights of admission.   Author: Theodora Fish, MD 06/25/2023 2:11 AM  For on call review [email or web address].

## 2023-06-25 NOTE — ED Notes (Signed)
 Pt ambulatory to restroom with steady gait.

## 2023-06-25 NOTE — Discharge Summary (Signed)
 Physician Discharge Summary  [name] [name] UJW:[id number] DOB: 1940-07-14 DOA: 06/24/2023  PCP: Patient, No Pcp Per  Admit date: 06/24/2023 Discharge date: 06/25/2023  Admitted From: Home Disposition: Home  Recommendations for Outpatient Follow-up:  Follow up with PCP in 1 week with repeat CBC/BMP Follow up in ED if symptoms worsen or new appear   Home Health: No Equipment/Devices: None  Discharge Condition: Stable CODE STATUS: Full Diet recommendation: Heart healthy  Brief/Interim Summary: 83 y.o. male with medical history significant of coronary artery disease status post CABG with subsequent PCI and stenting, atrial fibrillation, CKD, anemia of chronic disease, hyperlipidemia, GERD presented with substernal chest pain.  On presentation, troponins were unremarkable and flat with no ischemic changes.  Cardiology was consulted.  Medical management was recommended with increasing dose of Imdur .  Subsequently, cardiology has cleared the patient for discharge.  Patient is currently chest pain-free and feels okay to go home today.  He will be discharged home today with outpatient follow-up with PCP and cardiology.  Discharge Diagnoses:   Chest pain in a patient with history of CAD/CABG/PCI and stenting Hypertension Hyperlipidemia -On presentation, troponins were unremarkable and flat with no ischemic changes.  Cardiology was consulted.  Medical management was recommended with increasing dose of Imdur .  Subsequently, cardiology has cleared the patient for discharge.  Patient is currently chest pain-free and feels okay to go home today.  He will be discharged home today with outpatient follow-up with PCP and cardiology. - Continue increased dose of Imdur .  Continue aspirin , Plavix , losartan , metoprolol  tartrate.  Continue Repatha   CKD stage IIIb - Creatinine stable.  Outpatient follow-up  Depression/anxiety - Continue paroxetine      Discharge Instructions  Discharge Instructions      Diet - low sodium heart healthy   Complete by: As directed    Increase activity slowly   Complete by: As directed       Allergies as of 06/25/2023       Reactions   Shellfish Allergy Anaphylaxis   Zolpidem Tartrate Other (See Comments)   Hallucinations   Zolpidem Tartrate Other (See Comments)   Hallucinations   Brilinta  [ticagrelor ] Other (See Comments)   Reaction not recalled   Amoxicillin-pot Clavulanate Other (See Comments)   "Burns' Stomach"   Atorvastatin  Other (See Comments)   "Made the legs hurt"   Codeine Nausea And Vomiting   Erythromycin Diarrhea, Nausea And Vomiting, Other (See Comments)   All -mycins cause upset stomach   Hydrochlorothiazide Other (See Comments)   Reaction not recalled   Hydrocodone  Nausea And Vomiting   Morphine  Nausea And Vomiting   Nitrofuran Derivatives Other (See Comments)   Reaction niot recalled   Oxycodone Hcl Nausea And Vomiting   Tramadol Nausea And Vomiting   Zocor [simvastatin] Other (See Comments)   Muscle pain/soreness        Medication List     STOP taking these medications    LORazepam  0.5 MG tablet Commonly known as: ATIVAN        TAKE these medications    amLODipine  10 MG tablet Commonly known as: NORVASC  Take 10 mg by mouth daily.   aspirin  EC 81 MG tablet Take 81 mg by mouth at bedtime.   Benefiber Powd Take 1 Scoop by mouth 2 (two) times daily.   clopidogrel  75 MG tablet Commonly known as: PLAVIX  TAKE 1 TABLET BY MOUTH DAILY WITH BREAKFAST.   ferrous sulfate 325 (65 FE) MG tablet Take 325 mg by mouth daily.   isosorbide  mononitrate 60 MG  24 hr tablet Commonly known as: IMDUR  Take 1 tablet (60 mg total) by mouth daily. What changed:  medication strength how much to take additional instructions   losartan  25 MG tablet Commonly known as: COZAAR  Take 25 mg by mouth daily.   metoprolol  tartrate 50 MG tablet Commonly known as: LOPRESSOR  Take 0.5 tablets (25 mg total) by mouth 2 (two) times  daily. What changed: how much to take   nitroGLYCERIN  0.4 MG SL tablet Commonly known as: NITROSTAT  Place 1 tablet (0.4 mg total) under the tongue every 5 (five) minutes as needed. For chest pain   ondansetron  4 MG disintegrating tablet Commonly known as: ZOFRAN -ODT Take 4 mg by mouth every 6 (six) hours as needed.   pantoprazole  40 MG tablet Commonly known as: PROTONIX  Take 1 tablet (40 mg total) by mouth 2 (two) times daily before a meal.   PARoxetine  30 MG tablet Commonly known as: PAXIL  SMARTSIG:1 Tablet(s) By Mouth Every Evening   polyethylene glycol 17 g packet Commonly known as: MIRALAX / GLYCOLAX Take 17 g by mouth at bedtime.   Repatha  SureClick 140 MG/ML Soaj Generic drug: Evolocumab  INJECT 140 MG INTO THE SKIN EVERY 14 (FOURTEEN) DAYS.        Follow-up Information     PCP. Schedule an appointment as soon as possible for a visit in 1 week(s).                 Allergies  Allergen Reactions   Shellfish Allergy Anaphylaxis   Zolpidem Tartrate Other (See Comments)    Hallucinations    Zolpidem Tartrate Other (See Comments)    Hallucinations   Brilinta  [Ticagrelor ] Other (See Comments)    Reaction not recalled   Amoxicillin-Pot Clavulanate Other (See Comments)    "Burns' Stomach"   Atorvastatin  Other (See Comments)    "Made the legs hurt"   Codeine Nausea And Vomiting   Erythromycin Diarrhea, Nausea And Vomiting and Other (See Comments)    All -mycins cause upset stomach   Hydrochlorothiazide Other (See Comments)    Reaction not recalled   Hydrocodone  Nausea And Vomiting   Morphine  Nausea And Vomiting   Nitrofuran Derivatives Other (See Comments)    Reaction niot recalled   Oxycodone Hcl Nausea And Vomiting   Tramadol Nausea And Vomiting   Zocor [Simvastatin] Other (See Comments)    Muscle pain/soreness    Consultations: Cardiology   Procedures/Studies: DG Chest 2 View Result Date: 06/24/2023 CLINICAL DATA:  Chest pain a EXAM: CHEST - 2  VIEW COMPARISON:  Chest x-ray 04/17/2023 FINDINGS: The heart is enlarged. The lungs are clear. There is no pleural effusion or pneumothorax. Patient is status post cardiac surgery. No acute fractures are seen. IMPRESSION: Cardiomegaly. No acute cardiopulmonary process. Electronically Signed   By: Tyron Gallon M.D.   On: 06/24/2023 22:39   DG Abd 1 View Result Date: 05/29/2023 CLINICAL DATA:  Abdominal pain.  Overflow diarrhea. EXAM: ABDOMEN - 1 VIEW COMPARISON:  CT 04/17/2023 FINDINGS: Small volume of formed stool in the colon. Small volume of stool in the rectum. No small bowel dilatation or evidence of obstruction. Cholecystectomy clips in the right upper quadrant. 4 mm left renal calculus. Additional calcification in the left upper quadrant is in the pericolic gutter on CT. Pelvic phleboliths. IMPRESSION: 1. Small volume of formed stool in the colon. No bowel obstruction. 2. Left renal calculus. Electronically Signed   By: Chadwick Colonel M.D.   On: 05/29/2023 11:43      Subjective: Patient  seen and examined at bedside.  Poor historian.  No chest pain reported.  Wants to go home today.  No fever, vomiting, abdominal pain reported  Discharge Exam: Vitals:   06/25/23 0700 06/25/23 0715  BP: (!) 138/49   Pulse: (!) 51 (!) 53  Resp: 12 14  Temp:    SpO2: 95% 96%    General: Pt is alert, awake, not in acute distress.  Slow to respond.  Poor historian.  On room air. Cardiovascular: Mild intermittent bradycardia present; S1/S2 + Respiratory: bilateral decreased breath sounds at bases Abdominal: Soft, NT, ND, bowel sounds + Extremities: no edema, no cyanosis    The results of significant diagnostics from this hospitalization (including imaging, microbiology, ancillary and laboratory) are listed below for reference.     Microbiology: No results found for this or any previous visit (from the past 240 hours).   Labs: BNP (last 3 results) No results for input(s): "BNP" in the last 8760  hours. Basic Metabolic Panel: Recent Labs  Lab 06/24/23 2218 06/25/23 0454  NA 137 138  K 4.7 4.3  CL 105 107  CO2 25 25  GLUCOSE 102* 101*  BUN 24* 22  CREATININE 1.73* 1.60*  CALCIUM  8.4* 8.2*   Liver Function Tests: No results for input(s): "AST", "ALT", "ALKPHOS", "BILITOT", "PROT", "ALBUMIN" in the last 168 hours. No results for input(s): "LIPASE", "AMYLASE" in the last 168 hours. No results for input(s): "AMMONIA" in the last 168 hours. CBC: Recent Labs  Lab 06/24/23 2218 06/25/23 0454  WBC 6.0 5.0  NEUTROABS 3.5  --   HGB 13.0 13.1  HCT 38.1* 38.2*  MCV 93.2 92.7  PLT 182 168   Cardiac Enzymes: No results for input(s): "CKTOTAL", "CKMB", "CKMBINDEX", "TROPONINI" in the last 168 hours. BNP: Invalid input(s): "POCBNP" CBG: No results for input(s): "GLUCAP" in the last 168 hours. D-Dimer No results for input(s): "DDIMER" in the last 72 hours. Hgb A1c No results for input(s): "HGBA1C" in the last 72 hours. Lipid Profile Recent Labs    06/25/23 0454  CHOL 105  HDL 29*  LDLCALC 45  TRIG 308*  CHOLHDL 3.6   Thyroid  function studies No results for input(s): "TSH", "T4TOTAL", "T3FREE", "THYROIDAB" in the last 72 hours.  Invalid input(s): "FREET3" Anemia work up No results for input(s): "VITAMINB12", "FOLATE", "FERRITIN", "TIBC", "IRON", "RETICCTPCT" in the last 72 hours. Urinalysis    Component Value Date/Time   COLORURINE YELLOW 09/27/2017 2049   APPEARANCEUR CLEAR 09/27/2017 2049   LABSPEC 1.021 09/27/2017 2049   PHURINE 5.0 09/27/2017 2049   GLUCOSEU NEGATIVE 09/27/2017 2049   HGBUR SMALL (A) 09/27/2017 2049   BILIRUBINUR NEGATIVE 09/27/2017 2049   KETONESUR NEGATIVE 09/27/2017 2049   PROTEINUR NEGATIVE 09/27/2017 2049   UROBILINOGEN 0.2 02/08/2014 1315   NITRITE NEGATIVE 09/27/2017 2049   LEUKOCYTESUR NEGATIVE 09/27/2017 2049   Sepsis Labs Recent Labs  Lab 06/24/23 2218 06/25/23 0454  WBC 6.0 5.0   Microbiology No results found for  this or any previous visit (from the past 240 hours).   Time coordinating discharge: 35 minutes  SIGNED:   Audria Leather, MD  [facility] 06/25/2023, 9:35 AM

## 2023-07-02 ENCOUNTER — Encounter: Payer: Self-pay | Admitting: Cardiology

## 2023-07-06 ENCOUNTER — Telehealth: Payer: Self-pay | Admitting: Gastroenterology

## 2023-07-06 NOTE — Telephone Encounter (Signed)
 Called to check in on patient's progress.  Recent KUB showed small amount of formed stool in the colon.  He is currently on MiraLAX 1 capful daily and Benefiber 2 teaspoons daily.  States he continues to have loose bowel movements after eating.  Recommended discontinuing MiraLAX and continue with fiber only to see how he does.  He does have a follow-up scheduled with Dr. Elvin Hammer 07/24/2023 in which further discussion and management can be pursued.  Patient thanked me for my call and will call back with any issues

## 2023-07-17 NOTE — Progress Notes (Signed)
 HPI: FU CAD and chronic CP. S/P CABG 2001; multiple PCI since. Abd ultrasound 8/18 showed no aneurysm. ABIs 11/19 normal. Cardiac catheterization February 2024 showed 70% ostial left main, occluded LAD, 60% in-stent restenosis proximal circumflex and 60% mid right coronary artery, patent LIMA to the LAD, patent sequential left radial to D1 with jump segment from D1 to OM occluded and mildly elevated LVEDP.  Patient had PCI of the left main.  Carotid Dopplers September 2024 showed 40 to 59% right and 1 to 39% left stenosis.  Echocardiogram in [place] March 2025 showed normal LV function, mild left ventricular hypertrophy, grade 2 diastolic dysfunction and mild tricuspid regurgitation.  Nuclear study in [facility] March 2025 showed ejection fraction 59%, diaphragmatic attenuation and no ischemia.  Patient again admitted in June with chest pain.  Troponins were normal and patient treated medically.  Since last seen, patient has some chest discomfort after eating and drinking relieved with drinking water.  Otherwise no other chest pain noted.  He has some dyspnea at times but denies orthopnea.  No syncope.  Current Outpatient Medications  Medication Sig Dispense Refill   amLODipine  (NORVASC ) 10 MG tablet Take 10 mg by mouth daily.     aspirin  EC 81 MG tablet Take 81 mg by mouth at bedtime.      clopidogrel  (PLAVIX ) 75 MG tablet TAKE 1 TABLET BY MOUTH DAILY WITH BREAKFAST. 90 tablet 2   Evolocumab  (REPATHA  SURECLICK) 140 MG/ML SOAJ INJECT 140 MG INTO THE SKIN EVERY 14 (FOURTEEN) DAYS. 6 mL 3   ferrous sulfate 325 (65 FE) MG tablet Take 325 mg by mouth daily.     metoprolol  tartrate (LOPRESSOR ) 50 MG tablet Take 0.5 tablets (25 mg total) by mouth 2 (two) times daily.     nitroGLYCERIN  (NITROSTAT ) 0.4 MG SL tablet Place 1 tablet (0.4 mg total) under the tongue every 5 (five) minutes as needed. For chest pain 25 tablet 11   pantoprazole  (PROTONIX ) 40 MG tablet Take 1 tablet (40 mg total) by mouth 2 (two)  times daily before a meal.     isosorbide  mononitrate (IMDUR ) 60 MG 24 hr tablet Take 1 tablet (60 mg total) by mouth daily. (Patient taking differently: Take 30 mg by mouth daily.) 30 tablet 0   losartan  (COZAAR ) 25 MG tablet Take 25 mg by mouth daily. (Patient taking differently: Take 50 mg by mouth daily.)     ondansetron  (ZOFRAN -ODT) 4 MG disintegrating tablet Take 4 mg by mouth every 6 (six) hours as needed.     PARoxetine  (PAXIL ) 30 MG tablet SMARTSIG:1 Tablet(s) By Mouth Every Evening     polyethylene glycol (MIRALAX / GLYCOLAX) 17 g packet Take 17 g by mouth at bedtime. (Patient not taking: Reported on 07/24/2023)     Wheat Dextrin (BENEFIBER) POWD Take 1 Scoop by mouth 2 (two) times daily. (Patient not taking: Reported on 07/24/2023)     No current facility-administered medications for this visit.     Past Medical History:  Diagnosis Date   Anemia 10/06/2011   Anginal pain (HCC)    Arthritis    joints hurt; shoulders, arms, back (08/13/2013)   Atrial fibrillation (HCC)    B12 deficiency    Carotid stenosis 04/10/2011   a. s/p left carotid endarterectomy 04/14/2011.;  b.  Carotid US  (11/13):  L CEA ok; RICA 1-39%   Chronic bronchitis (HCC)    get it q yr   Chronic chest pain    Chronic lower back pain  Colon polyp    adenomatous   Coronary artery disease    a. S/p CABG in 2001. b. S/p DES to protected LM and BMS to RCA 2006. c. 12/2009: s/p DES to Cx;  d. 09/2011 Cath: patent stents, patent grafts -->Med Rx.;  e. CP with abnl Nuc => LHC 10/04/12: oLM 30%, dLM stent into the CFX patent, LAD occluded, LIMA-LAD patent, RI 30%, mid AVCFX 30%, oOM1 50%, oRCA stent patent, mRCA 50-60%, Radial graft-Dx patent; EF 55%.=> Med Rx.  f. stent to LM & Circ,   Daily headache    for the last couple weeks (08/13/2013)   Diverticulosis    Esophageal stricture    GERD (gastroesophageal reflux disease)    Hemorrhoids    Hiatal hernia    Hyperlipidemia    Hypertension    Left bundle branch  block    Memory deficit    Pancreatitis 12/2009   ERCP ok.   Prostate cancer [facility])    s/p cryoablation   Renal cyst    Seen on CT 08/2011 also with circumferential bladder wall thickening   Skin cancer    cut it off my right ear   Statin intolerance     Past Surgical History:  Procedure Laterality Date   CARDIAC CATHETERIZATION     CHOLECYSTECTOMY     COLONOSCOPY     CORONARY ANGIOPLASTY WITH STENT PLACEMENT     1 + 1   CORONARY ARTERY BYPASS GRAFT  2001   CORONARY STENT INTERVENTION N/A 09/11/2017   Procedure: CORONARY STENT INTERVENTION;  Surgeon: Court Dorn PARAS, MD;  [facility];  Service: Cardiovascular;  Laterality: N/A;   CORONARY STENT INTERVENTION N/A 03/06/2022   Procedure: CORONARY STENT INTERVENTION;  Surgeon: Mady Bruckner, MD;  Location: [facility];  Service: Cardiovascular;  Laterality: N/A;   CORONARY STENT PLACEMENT  09/11/2017   Ost Cx to Prox Cx lesion is 95% stenosed.   ENDARTERECTOMY  04/14/2011   Procedure: ENDARTERECTOMY CAROTID;  Surgeon: Lynwood JONETTA Collum, MD;  Location: [facility] OR;  Service: Vascular;  Laterality: Left;  Would like to perform procedure first, at 0730   EYE SURGERY     cataract   INGUINAL HERNIA REPAIR Bilateral    LEFT HEART CATH AND CORONARY ANGIOGRAPHY N/A 03/06/2022   Procedure: LEFT HEART CATH AND CORONARY ANGIOGRAPHY;  Surgeon: Mady Bruckner, MD;  [facility];  Service: Cardiovascular;  Laterality: N/A;   LEFT HEART CATH AND CORS/GRAFTS ANGIOGRAPHY N/A 09/11/2017   Procedure: LEFT HEART CATH AND CORS/GRAFTS ANGIOGRAPHY;  Surgeon: Court Dorn PARAS, MD;  [facility];  Service: Cardiovascular;  Laterality: N/A;   LEFT HEART CATHETERIZATION WITH CORONARY/GRAFT ANGIOGRAM N/A 10/05/2011   Procedure: LEFT HEART CATHETERIZATION WITH EL BILE;  Surgeon: Debby JONETTA Como, MD;  Location: [facility] CATH LAB;  Service: Cardiovascular;  Laterality: N/A;   LEFT HEART  CATHETERIZATION WITH CORONARY/GRAFT ANGIOGRAM N/A 10/04/2012   Procedure: LEFT HEART CATHETERIZATION WITH EL BILE;  Surgeon: Toribio JONELLE Fuel, MD;  Location: [facility] CATH LAB;  Service: Cardiovascular;  Laterality: N/A;   POLYPECTOMY     PR VEIN BYPASS GRAFT,AORTO-FEM-POP     PROSTATE CRYOABLATION      Social History   Socioeconomic History   Marital status: Widowed    Spouse name: Not on file   Number of children: 2   Years of education: Not on file   Highest education level: Not on file  Occupational History   Occupation: Retired  Comment: raised chickens  Tobacco Use   Smoking status: Former    Types: Cigarettes   Smokeless tobacco: Current    Types: Chew   Tobacco comments:    smoked a few cigarettes; no more than 1 month  Vaping Use   Vaping status: Never Used  Substance and Sexual Activity   Alcohol use: No    Alcohol/week: 0.0 standard drinks of alcohol    Comment: no alcohol since I was a teenager   Drug use: No   Sexual activity: Not Currently  Other Topics Concern   Not on file  Social History Narrative   Patient is illiterate. He cannot read or write. He left school at about the seventh grade.   As of 10/2015 he reports that his wife is chronically ill at home with heart disease and COPD and is under hospice care. The bulk of the care is given by the patient and daughter.   Social Drivers of Corporate investment banker Strain: Not on file  Food Insecurity: Not on file  Transportation Needs: Not on file  Physical Activity: Not on file  Stress: Not on file  Social Connections: Not on file  Intimate Partner Violence: Not on file    Family History  Problem Relation Age of Onset   Heart disease Mother        Heart Disease before age 35   Hypertension Mother    Heart attack Mother    Lung cancer Father    Hypertension Sister    Heart disease Sister        Heart Disease before age 63   Cancer Sister    Heart attack Sister     Hypertension Brother    Heart disease Brother        Heart Disease before age 86   Heart attack Brother    Colon cancer Neg Hx    Esophageal cancer Neg Hx    Pancreatic cancer Neg Hx    Stomach cancer Neg Hx     ROS: no fevers or chills, productive cough, hemoptysis, dysphasia, odynophagia, melena, hematochezia, dysuria, hematuria, rash, seizure activity, orthopnea, PND, pedal edema, claudication. Remaining systems are negative.  Physical Exam: Well-developed well-nourished in no acute distress.  Skin is warm and dry.  HEENT is normal.  Neck is supple.  Chest is clear to auscultation with normal expansion.  Cardiovascular exam is regular rate and rhythm.  Abdominal exam nontender or distended. No masses palpated. Extremities show no edema. neuro grossly intact  EKG Interpretation Date/Time:  Tuesday July 24 2023 13:17:40 EDT Ventricular Rate:  57 PR Interval:  164 QRS Duration:  140 QT Interval:  454 QTC Calculation: 441 R Axis:   -34  Text Interpretation: Sinus bradycardia with occasional Premature ventricular complexes Left axis deviation Left bundle branch block Confirmed by Pietro Rogue ([id number]) on 07/24/2023 1:18:20 PM    A/P  1 chest pain-patient has had problems with chronic chest pain.  Electrocardiogram shows chronic left bundle branch block.  His description is not consistent with cardiac etiology as it occurs after eating and drinking certain foods and is relieved with drinking water.  Will follow for now.  2 coronary artery disease-continue aspirin .  Discontinue Plavix .  Intolerant to statins.  3 hyperlipidemia-patient is intolerant to statins.  Continue Repatha .    4 hypertension-patient's blood pressure is controlled.  Continue present medical regimen.  5 carotid artery disease-plan follow-up carotid Doppler September 2025.  Rogue Pietro, MD

## 2023-07-24 ENCOUNTER — Ambulatory Visit: Attending: Cardiology | Admitting: Cardiology

## 2023-07-24 ENCOUNTER — Encounter: Payer: Self-pay | Admitting: Cardiology

## 2023-07-24 ENCOUNTER — Encounter: Payer: Self-pay | Admitting: Internal Medicine

## 2023-07-24 ENCOUNTER — Ambulatory Visit (INDEPENDENT_AMBULATORY_CARE_PROVIDER_SITE_OTHER): Admitting: Internal Medicine

## 2023-07-24 VITALS — BP 124/70 | HR 68 | Ht 60.0 in | Wt 139.0 lb

## 2023-07-24 VITALS — BP 124/50 | HR 57 | Ht 64.0 in | Wt 139.0 lb

## 2023-07-24 DIAGNOSIS — K5900 Constipation, unspecified: Secondary | ICD-10-CM

## 2023-07-24 DIAGNOSIS — R194 Change in bowel habit: Secondary | ICD-10-CM | POA: Diagnosis not present

## 2023-07-24 DIAGNOSIS — R079 Chest pain, unspecified: Secondary | ICD-10-CM | POA: Insufficient documentation

## 2023-07-24 DIAGNOSIS — I10 Essential (primary) hypertension: Secondary | ICD-10-CM | POA: Insufficient documentation

## 2023-07-24 DIAGNOSIS — E785 Hyperlipidemia, unspecified: Secondary | ICD-10-CM | POA: Diagnosis not present

## 2023-07-24 DIAGNOSIS — I6523 Occlusion and stenosis of bilateral carotid arteries: Secondary | ICD-10-CM | POA: Insufficient documentation

## 2023-07-24 DIAGNOSIS — I2510 Atherosclerotic heart disease of native coronary artery without angina pectoris: Secondary | ICD-10-CM | POA: Diagnosis not present

## 2023-07-24 NOTE — Patient Instructions (Signed)
 Medication Instructions:  STOP PLAVIX   *If you need a refill on your cardiac medications before your next appointment, please call your pharmacy*  Testing/Procedures: Your physician has requested that you have a carotid duplex. This test is an ultrasound of the carotid arteries in your neck. It looks at blood flow through these arteries that supply the brain with blood. Allow one hour for this exam. There are no restrictions or special instructions. [address] And scheduled for September.   Follow-Up: At [facility], you and your health needs are our priority.  As part of our continuing mission to provide you with exceptional heart care, our providers are all part of one team.  This team includes your primary Cardiologist (physician) and Advanced Practice Providers or APPs (Physician Assistants and Nurse Practitioners) who all work together to provide you with the care you need, when you need it.  Your next appointment:   6 month(s)  Provider:   Callie Goodrich, PA-C, Kathleen Johnson, PA-C, Hao Meng, PA-C, Damien Braver, NP, or Katlyn West, NP      Then, Redell Shallow, MD will plan to see you again in 12 month(s).

## 2023-07-24 NOTE — Patient Instructions (Signed)
 Please follow up as needed if symptoms increase or worsen.  Thank you for trusting me with your gastrointestinal care!    Norleen Kiang, MD,  _______________________________________________________  If your blood pressure at your visit was 140/90 or greater, please contact your primary care physician to follow up on this.  _______________________________________________________  If you are age 83 or older, your body mass index should be between 23-30. Your Body mass index is 27.15 kg/m. If this is out of the aforementioned range listed, please consider follow up with your Primary Care Provider.  If you are age 72 or younger, your body mass index should be between 19-25. Your Body mass index is 27.15 kg/m. If this is out of the aformentioned range listed, please consider follow up with your Primary Care Provider.   ________________________________________________________  The [place] [facility] providers would like to encourage you to use MYCHART to communicate with providers for non-urgent requests or questions.  Due to long hold times on the telephone, sending your provider a message by [facility] may be a faster and more efficient way to get a response.  Please allow 48 business hours for a response.  Please remember that this is for non-urgent requests.  _______________________________________________________

## 2023-07-25 ENCOUNTER — Encounter: Payer: Self-pay | Admitting: Internal Medicine

## 2023-07-25 NOTE — Progress Notes (Signed)
 HISTORY OF PRESENT ILLNESS:  [name] [name] is a 83 y.o. male, with past medical history as listed below, who presents today for follow-up regarding management of irregular bowel habits and loose stools.  He is accompanied by his daughter.  The patient was seen in the office by the GI physician assistant May 29, 2023 for lower abdominal pain with urgency and fecal incontinence with loose stools.  See that dictation for details.  The patient states that he is doing better.  He credits this to drinking more water.  He describes his bowels as regular.  His GI review of systems is otherwise negative.  He does tell me that he has intermittent problems with swelling and pain in his great right toe.  Not currently  REVIEW OF SYSTEMS:  All non-GI ROS negative except for sinus and allergy, arthritis, visual change, headaches, hearing problems, ankle swelling, excessive urination, shortness of breath  Past Medical History:  Diagnosis Date   Anemia 10/06/2011   Anginal pain (HCC)    Arthritis    joints hurt; shoulders, arms, back (08/13/2013)   Atrial fibrillation (HCC)    B12 deficiency    Carotid stenosis 04/10/2011   a. s/p left carotid endarterectomy 04/14/2011.;  b.  Carotid US  (11/13):  L CEA ok; RICA 1-39%   Chronic bronchitis (HCC)    get it q yr   Chronic chest pain    Chronic lower back pain    Colon polyp    adenomatous   Coronary artery disease    a. S/p CABG in 2001. b. S/p DES to protected LM and BMS to RCA 2006. c. 12/2009: s/p DES to Cx;  d. 09/2011 Cath: patent stents, patent grafts -->Med Rx.;  e. CP with abnl Nuc => LHC 10/04/12: oLM 30%, dLM stent into the CFX patent, LAD occluded, LIMA-LAD patent, RI 30%, mid AVCFX 30%, oOM1 50%, oRCA stent patent, mRCA 50-60%, Radial graft-Dx patent; EF 55%.=> Med Rx.  f. stent to LM & Circ,   Daily headache    for the last couple weeks (08/13/2013)   Diverticulosis    Esophageal stricture    GERD (gastroesophageal reflux disease)     Hemorrhoids    Hiatal hernia    Hyperlipidemia    Hypertension    Left bundle branch block    Memory deficit    Pancreatitis 12/2009   ERCP ok.   Prostate cancer [name] [name])    s/p cryoablation   Renal cyst    Seen on CT 08/2011 also with circumferential bladder wall thickening   Skin cancer    cut it off my right ear   Statin intolerance     Past Surgical History:  Procedure Laterality Date   CARDIAC CATHETERIZATION     CHOLECYSTECTOMY     COLONOSCOPY     CORONARY ANGIOPLASTY WITH STENT PLACEMENT     1 + 1   CORONARY ARTERY BYPASS GRAFT  2001   CORONARY STENT INTERVENTION N/A 09/11/2017   Procedure: CORONARY STENT INTERVENTION;  Surgeon: Court Dorn PARAS, MD;  [facility];  Service: Cardiovascular;  Laterality: N/A;   CORONARY STENT INTERVENTION N/A 03/06/2022   Procedure: CORONARY STENT INTERVENTION;  Surgeon: Mady Bruckner, MD;  [facility];  Service: Cardiovascular;  Laterality: N/A;   CORONARY STENT PLACEMENT  09/11/2017   Ost Cx to Prox Cx lesion is 95% stenosed.   ENDARTERECTOMY  04/14/2011   Procedure: ENDARTERECTOMY CAROTID;  Surgeon: Lynwood JONETTA Collum, MD;  Location: [name]  OR;  Service: Vascular;  Laterality: Left;  Would like to perform procedure first, at 0730   EYE SURGERY     cataract   INGUINAL HERNIA REPAIR Bilateral    LEFT HEART CATH AND CORONARY ANGIOGRAPHY N/A 03/06/2022   Procedure: LEFT HEART CATH AND CORONARY ANGIOGRAPHY;  Surgeon: Mady Bruckner, MD;  [facility];  Service: Cardiovascular;  Laterality: N/A;   LEFT HEART CATH AND CORS/GRAFTS ANGIOGRAPHY N/A 09/11/2017   Procedure: LEFT HEART CATH AND CORS/GRAFTS ANGIOGRAPHY;  Surgeon: Court Dorn PARAS, MD;  [facility];  Service: Cardiovascular;  Laterality: N/A;   LEFT HEART CATHETERIZATION WITH CORONARY/GRAFT ANGIOGRAM N/A 10/05/2011   Procedure: LEFT HEART CATHETERIZATION WITH EL BILE;  Surgeon: Debby JONETTA Como, MD;   Location: [name] C[facility] LAB;  Service: Cardiovascular;  Laterality: N/A;   LEFT HEART CATHETERIZATION WITH CORONARY/GRAFT ANGIOGRAM N/A 10/04/2012   Procedure: LEFT HEART CATHETERIZATION WITH EL BILE;  Surgeon: Toribio JONELLE Fuel, MD;  Location: [name] CATH LAB;  Service: Cardiovascular;  Laterality: N/A;   POLYPECTOMY     PR VEIN BYPASS GRAFT,AORTO-FEM-POP     PROSTATE CRYOABLATION      Social History [name] [name]  reports that he has quit smoking. His smoking use included cigarettes. His smokeless tobacco use includes chew. He reports that he does not drink alcohol and does not use drugs.  family history includes Cancer in his sister; Heart attack in his brother, mother, and sister; Heart disease in his brother, mother, and sister; Hypertension in his brother, mother, and sister; Lung cancer in his father.  Allergies  Allergen Reactions   Shellfish Allergy Anaphylaxis   Zolpidem Tartrate Other (See Comments)    Hallucinations    Zolpidem Tartrate Other (See Comments)    Hallucinations   Brilinta  [Ticagrelor ] Other (See Comments)    Reaction not recalled   Amoxicillin-Pot Clavulanate Other (See Comments)    Burns' Stomach   Atorvastatin  Other (See Comments)    Made the legs hurt   Codeine Nausea And Vomiting   Erythromycin Diarrhea, Nausea And Vomiting and Other (See Comments)    All -mycins cause upset stomach   Hydrochlorothiazide Other (See Comments)    Reaction not recalled   Hydrocodone  Nausea And Vomiting   Morphine  Nausea And Vomiting   Nitrofuran Derivatives Other (See Comments)    Reaction niot recalled   Oxycodone Hcl Nausea And Vomiting   Tramadol Nausea And Vomiting   Zocor [Simvastatin] Other (See Comments)    Muscle pain/soreness       PHYSICAL EXAMINATION: Vital signs: BP 124/70   Pulse 68   Ht 5' (1.524 m)   Wt 139 lb (63 kg)   BMI 27.15 kg/m  General: Well-developed, well-nourished, no acute distress HEENT: Sclerae are anicteric,  conjunctiva pink. Oral mucosa intact Lungs: Clear Heart: Regular Abdomen: soft, nontender, nondistended, no obvious ascites, no peritoneal signs, normal bowel sounds. No organomegaly. Extremities: No edema.  Great right toe unremarkable Psychiatric: alert and oriented x3. Cooperative     ASSESSMENT:  1.  Irregular bowel habits.  Improved 2.  Intermittent problems with great right toe swelling and pain.  Likely   PLAN:  1.  No changes 2.  See PCP regarding possible gout 3.  GI follow-up as needed

## 2023-07-31 DIAGNOSIS — G4762 Sleep related leg cramps: Secondary | ICD-10-CM | POA: Diagnosis not present

## 2023-07-31 DIAGNOSIS — N1831 Chronic kidney disease, stage 3a: Secondary | ICD-10-CM | POA: Diagnosis not present

## 2023-07-31 DIAGNOSIS — R109 Unspecified abdominal pain: Secondary | ICD-10-CM | POA: Diagnosis not present

## 2023-07-31 DIAGNOSIS — F419 Anxiety disorder, unspecified: Secondary | ICD-10-CM | POA: Diagnosis not present

## 2023-07-31 DIAGNOSIS — G8929 Other chronic pain: Secondary | ICD-10-CM | POA: Diagnosis not present

## 2023-07-31 DIAGNOSIS — Z1331 Encounter for screening for depression: Secondary | ICD-10-CM | POA: Diagnosis not present

## 2023-07-31 DIAGNOSIS — E7800 Pure hypercholesterolemia, unspecified: Secondary | ICD-10-CM | POA: Diagnosis not present

## 2023-07-31 DIAGNOSIS — I10 Essential (primary) hypertension: Secondary | ICD-10-CM | POA: Diagnosis not present

## 2023-07-31 DIAGNOSIS — I25119 Atherosclerotic heart disease of native coronary artery with unspecified angina pectoris: Secondary | ICD-10-CM | POA: Diagnosis not present

## 2023-07-31 DIAGNOSIS — I679 Cerebrovascular disease, unspecified: Secondary | ICD-10-CM | POA: Diagnosis not present

## 2023-07-31 DIAGNOSIS — I739 Peripheral vascular disease, unspecified: Secondary | ICD-10-CM | POA: Diagnosis not present

## 2023-07-31 DIAGNOSIS — K219 Gastro-esophageal reflux disease without esophagitis: Secondary | ICD-10-CM | POA: Diagnosis not present

## 2023-07-31 DIAGNOSIS — M79675 Pain in left toe(s): Secondary | ICD-10-CM | POA: Diagnosis not present

## 2023-07-31 DIAGNOSIS — D509 Iron deficiency anemia, unspecified: Secondary | ICD-10-CM | POA: Diagnosis not present

## 2023-08-22 DIAGNOSIS — I25119 Atherosclerotic heart disease of native coronary artery with unspecified angina pectoris: Secondary | ICD-10-CM | POA: Diagnosis not present

## 2023-08-22 DIAGNOSIS — N1831 Chronic kidney disease, stage 3a: Secondary | ICD-10-CM | POA: Diagnosis not present

## 2023-08-22 DIAGNOSIS — I10 Essential (primary) hypertension: Secondary | ICD-10-CM | POA: Diagnosis not present

## 2023-08-22 DIAGNOSIS — Z139 Encounter for screening, unspecified: Secondary | ICD-10-CM | POA: Diagnosis not present

## 2023-08-22 DIAGNOSIS — Z9181 History of falling: Secondary | ICD-10-CM | POA: Diagnosis not present

## 2023-08-22 DIAGNOSIS — E875 Hyperkalemia: Secondary | ICD-10-CM | POA: Diagnosis not present

## 2023-09-19 DIAGNOSIS — M2550 Pain in unspecified joint: Secondary | ICD-10-CM | POA: Diagnosis not present

## 2023-09-19 DIAGNOSIS — N1831 Chronic kidney disease, stage 3a: Secondary | ICD-10-CM | POA: Diagnosis not present

## 2023-10-08 ENCOUNTER — Other Ambulatory Visit: Payer: Self-pay

## 2023-10-08 DIAGNOSIS — I6529 Occlusion and stenosis of unspecified carotid artery: Secondary | ICD-10-CM

## 2023-11-07 ENCOUNTER — Ambulatory Visit (HOSPITAL_COMMUNITY)
Admission: RE | Admit: 2023-11-07 | Discharge: 2023-11-07 | Disposition: A | Discharge status: Home / Self Care | Source: Ambulatory Visit | Attending: Vascular Surgery | Admitting: Vascular Surgery

## 2023-11-07 ENCOUNTER — Ambulatory Visit (INDEPENDENT_AMBULATORY_CARE_PROVIDER_SITE_OTHER): Admitting: Physician Assistant

## 2023-11-07 VITALS — BP 137/74 | HR 49 | Temp 97.7°F | Wt 140.7 lb

## 2023-11-07 DIAGNOSIS — I6529 Occlusion and stenosis of unspecified carotid artery: Secondary | ICD-10-CM | POA: Insufficient documentation

## 2023-11-07 DIAGNOSIS — I6523 Occlusion and stenosis of bilateral carotid arteries: Secondary | ICD-10-CM | POA: Insufficient documentation

## 2023-11-07 NOTE — Progress Notes (Signed)
 Office Note     CC:  follow up Requesting Provider:  Sheree Penne Palma*  HPI: [name] [name] is a 83 y.o. (Jul 19, 1940) male who presents for surveillance of carotid artery stenosis.  He underwent left carotid endarterectomy for symptomatic carotid stenosis in 2013 by Dr. Gerlean.  He denies any CVA or TIA since last office visit.  He is also not had any slurred speech, changes in vision, or one-sided weakness.  He is on aspirin  daily.  He also takes Repatha .   Past Medical History:  Diagnosis Date   Anemia 10/06/2011   Anginal pain    Arthritis    joints hurt; shoulders, arms, back (08/13/2013)   Atrial fibrillation (HCC)    B12 deficiency    Carotid stenosis 04/10/2011   a. s/p left carotid endarterectomy 04/14/2011.;  b.  Carotid US  (11/13):  L CEA ok; RICA 1-39%   Chronic bronchitis (HCC)    get it q yr   Chronic chest pain    Chronic lower back pain    Colon polyp    adenomatous   Coronary artery disease    a. S/p CABG in 2001. b. S/p DES to protected LM and BMS to RCA 2006. c. 12/2009: s/p DES to Cx;  d. 09/2011 Cath: patent stents, patent grafts -->Med Rx.;  e. CP with abnl Nuc => LHC 10/04/12: oLM 30%, dLM stent into the CFX patent, LAD occluded, LIMA-LAD patent, RI 30%, mid AVCFX 30%, oOM1 50%, oRCA stent patent, mRCA 50-60%, Radial graft-Dx patent; EF 55%.=> Med Rx.  f. stent to LM & Circ,   Daily headache    for the last couple weeks (08/13/2013)   Diverticulosis    Esophageal stricture    GERD (gastroesophageal reflux disease)    Hemorrhoids    Hiatal hernia    Hyperlipidemia    Hypertension    Left bundle branch block    Memory deficit    Pancreatitis 12/2009   ERCP ok.   Prostate cancer [facility])    s/p cryoablation   Renal cyst    Seen on CT 08/2011 also with circumferential bladder wall thickening   Skin cancer    cut it off my right ear   Statin intolerance     Past Surgical History:  Procedure Laterality Date   CARDIAC CATHETERIZATION      CHOLECYSTECTOMY     COLONOSCOPY     CORONARY ANGIOPLASTY WITH STENT PLACEMENT     1 + 1   CORONARY ARTERY BYPASS GRAFT  2001   CORONARY STENT INTERVENTION N/A 09/11/2017   Procedure: CORONARY STENT INTERVENTION;  Surgeon: Court Dorn PARAS, MD;  Location: [facility];  Service: Cardiovascular;  Laterality: N/A;   CORONARY STENT INTERVENTION N/A 03/06/2022   Procedure: CORONARY STENT INTERVENTION;  Surgeon: Mady Bruckner, MD;  Location: [facility];  Service: Cardiovascular;  Laterality: N/A;   CORONARY STENT PLACEMENT  09/11/2017   Ost Cx to Prox Cx lesion is 95% stenosed.   ENDARTERECTOMY  04/14/2011   Procedure: ENDARTERECTOMY CAROTID;  Surgeon: Lynwood JONETTA Gerlean, MD;  Location: [facility] OR;  Service: Vascular;  Laterality: Left;  Would like to perform procedure first, at 0730   EYE SURGERY     cataract   INGUINAL HERNIA REPAIR Bilateral    LEFT HEART CATH AND CORONARY ANGIOGRAPHY N/A 03/06/2022   Procedure: LEFT HEART CATH AND CORONARY ANGIOGRAPHY;  Surgeon: Mady Bruckner, MD;  Location: [facility];  Service: Cardiovascular;  Laterality: N/A;  LEFT HEART CATH AND CORS/GRAFTS ANGIOGRAPHY N/A 09/11/2017   Procedure: LEFT HEART CATH AND CORS/GRAFTS ANGIOGRAPHY;  Surgeon: Court Dorn PARAS, MD;  Location: [facility];  Service: Cardiovascular;  Laterality: N/A;   LEFT HEART CATHETERIZATION WITH CORONARY/GRAFT ANGIOGRAM N/A 10/05/2011   Procedure: LEFT HEART CATHETERIZATION WITH EL BILE;  Surgeon: Debby JONETTA Como, MD;  Location: [facility] CATH LAB;  Service: Cardiovascular;  Laterality: N/A;   LEFT HEART CATHETERIZATION WITH CORONARY/GRAFT ANGIOGRAM N/A 10/04/2012   Procedure: LEFT HEART CATHETERIZATION WITH EL BILE;  Surgeon: Toribio JONELLE Fuel, MD;  Location: [facility] CATH LAB;  Service: Cardiovascular;  Laterality: N/A;   POLYPECTOMY     PR VEIN BYPASS GRAFT,AORTO-FEM-POP     PROSTATE CRYOABLATION      Social History   Socioeconomic History    Marital status: Widowed    Spouse name: Not on file   Number of children: 2   Years of education: Not on file   Highest education level: Not on file  Occupational History   Occupation: Retired    Comment: raised Programme researcher, broadcasting/film/video  Tobacco Use   Smoking status: Former    Types: Cigarettes   Smokeless tobacco: Current    Types: Chew   Tobacco comments:    smoked a few cigarettes; no more than 1 month  Vaping Use   Vaping status: Never Used  Substance and Sexual Activity   Alcohol use: No    Alcohol/week: 0.0 standard drinks of alcohol    Comment: no alcohol since I was a teenager   Drug use: No   Sexual activity: Not Currently  Other Topics Concern   Not on file  Social History Narrative   Patient is illiterate. He cannot read or write. He left school at about the seventh grade.   As of 10/2015 he reports that his wife is chronically ill at home with heart disease and COPD and is under hospice care. The bulk of the care is given by the patient and daughter.   Social Drivers of Corporate investment banker Strain: Not on file  Food Insecurity: Not on file  Transportation Needs: Not on file  Physical Activity: Not on file  Stress: Not on file  Social Connections: Not on file  Intimate Partner Violence: Not on file    Family History  Problem Relation Age of Onset   Heart disease Mother        Heart Disease before age 1   Hypertension Mother    Heart attack Mother    Lung cancer Father    Hypertension Sister    Heart disease Sister        Heart Disease before age 60   Cancer Sister    Heart attack Sister    Hypertension Brother    Heart disease Brother        Heart Disease before age 19   Heart attack Brother    Colon cancer Neg Hx    Esophageal cancer Neg Hx    Pancreatic cancer Neg Hx    Stomach cancer Neg Hx     Current Outpatient Medications  Medication Sig Dispense Refill   amLODipine  (NORVASC ) 10 MG tablet Take 10 mg by mouth daily.     aspirin  EC 81 MG  tablet Take 81 mg by mouth at bedtime.      Evolocumab  (REPATHA  SURECLICK) 140 MG/ML SOAJ INJECT 140 MG INTO THE SKIN EVERY 14 (FOURTEEN) DAYS. 6 mL 3   ferrous sulfate 325 (65 FE) MG tablet Take  325 mg by mouth daily.     isosorbide  mononitrate (IMDUR ) 60 MG 24 hr tablet Take 1 tablet (60 mg total) by mouth daily. (Patient taking differently: Take 30 mg by mouth daily.) 30 tablet 0   losartan  (COZAAR ) 25 MG tablet Take 25 mg by mouth daily. (Patient taking differently: Take 50 mg by mouth daily.)     metoprolol  tartrate (LOPRESSOR ) 50 MG tablet Take 0.5 tablets (25 mg total) by mouth 2 (two) times daily.     nitroGLYCERIN  (NITROSTAT ) 0.4 MG SL tablet Place 1 tablet (0.4 mg total) under the tongue every 5 (five) minutes as needed. For chest pain 25 tablet 11   ondansetron  (ZOFRAN -ODT) 4 MG disintegrating tablet Take 4 mg by mouth every 6 (six) hours as needed.     pantoprazole  (PROTONIX ) 40 MG tablet Take 1 tablet (40 mg total) by mouth 2 (two) times daily before a meal.     PARoxetine  (PAXIL ) 30 MG tablet SMARTSIG:1 Tablet(s) By Mouth Every Evening     polyethylene glycol (MIRALAX / GLYCOLAX) 17 g packet Take 17 g by mouth at bedtime. (Patient not taking: Reported on 07/24/2023)     Wheat Dextrin (BENEFIBER) POWD Take 1 Scoop by mouth 2 (two) times daily. (Patient not taking: Reported on 07/24/2023)     No current facility-administered medications for this visit.    Allergies  Allergen Reactions   Shellfish Allergy Anaphylaxis   Zolpidem Tartrate Other (See Comments)    Hallucinations    Zolpidem Tartrate Other (See Comments)    Hallucinations   Brilinta  [Ticagrelor ] Other (See Comments)    Reaction not recalled   Amoxicillin-Pot Clavulanate Other (See Comments)    Burns' Stomach   Atorvastatin  Other (See Comments)    Made the legs hurt   Codeine Nausea And Vomiting   Erythromycin Diarrhea, Nausea And Vomiting and Other (See Comments)    All -mycins cause upset stomach    Hydrochlorothiazide Other (See Comments)    Reaction not recalled   Hydrocodone  Nausea And Vomiting   Morphine  Nausea And Vomiting   Nitrofuran Derivatives Other (See Comments)    Reaction niot recalled   Oxycodone Hcl Nausea And Vomiting   Tramadol Nausea And Vomiting   Zocor [Simvastatin] Other (See Comments)    Muscle pain/soreness     REVIEW OF SYSTEMS:  Negative unless noted in HPI [X]  denotes positive finding, [ ]  denotes negative finding Cardiac  Comments:  Chest pain or chest pressure:    Shortness of breath upon exertion:    Short of breath when lying flat:    Irregular heart rhythm:        Vascular    Pain in calf, thigh, or hip brought on by ambulation:    Pain in feet at night that wakes you up from your sleep:     Blood clot in your veins:    Leg swelling:         Pulmonary    Oxygen  at home:    Productive cough:     Wheezing:         Neurologic    Sudden weakness in arms or legs:     Sudden numbness in arms or legs:     Sudden onset of difficulty speaking or slurred speech:    Temporary loss of vision in one eye:     Problems with dizziness:         Gastrointestinal    Blood in stool:     Vomited blood:  Genitourinary    Burning when urinating:     Blood in urine:        Psychiatric    Major depression:         Hematologic    Bleeding problems:    Problems with blood clotting too easily:        Skin    Rashes or ulcers:        Constitutional    Fever or chills:      PHYSICAL EXAMINATION:  Vitals:   11/07/23 1423 11/07/23 1425  BP: 130/64 137/74  Pulse: (!) 48 (!) 49  Temp: 97.7 F (36.5 C)   TempSrc: Temporal   Weight: 140 lb 11.2 oz (63.8 kg)     General:  WDWN in NAD; vital signs documented above Gait: Not observed HENT: WNL, normocephalic Pulmonary: normal non-labored breathing Cardiac: regular HR Abdomen: soft, NT, no masses Skin: without rashes Vascular Exam/Pulses: palpable and symmetric radial and PT  pulses Extremities: without ischemic changes, without Gangrene , without cellulitis; without open wounds;  Musculoskeletal: no muscle wasting or atrophy  Neurologic: A&O X 3; CN grossly intact Psychiatric:  The pt has Normal affect.   Non-Invasive Vascular Imaging:   Right ICA 40 to 59% stenosis Left ICA endarterectomy site widely patent   ASSESSMENT/PLAN:: 83 y.o. male here for follow up for surveillance of carotid artery stenosis  [name] [name] has been doing well since last office visit.  He denies any strokelike symptoms including slurring speech, changes in vision, or one-sided weakness.  He has a history of a left carotid endarterectomy over 10 years ago by Dr. Gerlean.  The endarterectomy site is still widely patent by duplex.  He has a 40 to 59% stenosis of the contralateral ICA.  He will continue his aspirin  daily.  We will repeat carotid duplex in 1 year.  He will seek immediate medical attention if he develops any strokelike symptoms.   Donnice Sender, PA-C [facility] [phone]  Clinic MD:   Sheree

## 2023-11-09 DIAGNOSIS — M7989 Other specified soft tissue disorders: Secondary | ICD-10-CM | POA: Diagnosis not present

## 2023-11-09 DIAGNOSIS — Z23 Encounter for immunization: Secondary | ICD-10-CM | POA: Diagnosis not present

## 2023-11-09 DIAGNOSIS — M109 Gout, unspecified: Secondary | ICD-10-CM | POA: Diagnosis not present

## 2023-11-09 DIAGNOSIS — N1831 Chronic kidney disease, stage 3a: Secondary | ICD-10-CM | POA: Diagnosis not present

## 2023-11-09 DIAGNOSIS — M79672 Pain in left foot: Secondary | ICD-10-CM | POA: Diagnosis not present

## 2023-11-09 DIAGNOSIS — M1990 Unspecified osteoarthritis, unspecified site: Secondary | ICD-10-CM | POA: Diagnosis not present

## 2023-11-09 DIAGNOSIS — M79673 Pain in unspecified foot: Secondary | ICD-10-CM | POA: Diagnosis not present

## 2023-11-09 DIAGNOSIS — M0579 Rheumatoid arthritis with rheumatoid factor of multiple sites without organ or systems involvement: Secondary | ICD-10-CM | POA: Diagnosis not present

## 2023-11-09 DIAGNOSIS — M130 Polyarthritis, unspecified: Secondary | ICD-10-CM | POA: Diagnosis not present

## 2023-11-09 DIAGNOSIS — Z79899 Other long term (current) drug therapy: Secondary | ICD-10-CM | POA: Diagnosis not present

## 2023-11-09 DIAGNOSIS — M79642 Pain in left hand: Secondary | ICD-10-CM | POA: Diagnosis not present

## 2023-11-09 DIAGNOSIS — M79641 Pain in right hand: Secondary | ICD-10-CM | POA: Diagnosis not present

## 2023-11-09 DIAGNOSIS — M2550 Pain in unspecified joint: Secondary | ICD-10-CM | POA: Diagnosis not present

## 2023-11-09 DIAGNOSIS — M79671 Pain in right foot: Secondary | ICD-10-CM | POA: Diagnosis not present

## 2023-11-09 DIAGNOSIS — M79643 Pain in unspecified hand: Secondary | ICD-10-CM | POA: Diagnosis not present

## 2023-11-28 DIAGNOSIS — K5732 Diverticulitis of large intestine without perforation or abscess without bleeding: Secondary | ICD-10-CM | POA: Diagnosis not present

## 2023-11-30 ENCOUNTER — Telehealth: Payer: Self-pay

## 2023-11-30 NOTE — Telephone Encounter (Signed)
 Received a call from [facility] that the pt is being discharged today and Dr. Amal Kazaure recommends pt have an egd. He was seen for pancreatitis and pud and she was afraid pt would not let Dr. Abran. Pt to call the office to set up ov when his daughter is there as she is his transportation.

## 2023-12-01 DIAGNOSIS — R197 Diarrhea, unspecified: Secondary | ICD-10-CM | POA: Diagnosis not present

## 2023-12-01 DIAGNOSIS — R059 Cough, unspecified: Secondary | ICD-10-CM | POA: Diagnosis not present

## 2023-12-07 DIAGNOSIS — G8929 Other chronic pain: Secondary | ICD-10-CM | POA: Diagnosis not present

## 2023-12-07 DIAGNOSIS — Z79899 Other long term (current) drug therapy: Secondary | ICD-10-CM | POA: Diagnosis not present

## 2023-12-07 DIAGNOSIS — R109 Unspecified abdominal pain: Secondary | ICD-10-CM | POA: Diagnosis not present

## 2023-12-07 DIAGNOSIS — K859 Acute pancreatitis without necrosis or infection, unspecified: Secondary | ICD-10-CM | POA: Diagnosis not present

## 2023-12-07 DIAGNOSIS — K5792 Diverticulitis of intestine, part unspecified, without perforation or abscess without bleeding: Secondary | ICD-10-CM | POA: Diagnosis not present

## 2023-12-07 DIAGNOSIS — I25119 Atherosclerotic heart disease of native coronary artery with unspecified angina pectoris: Secondary | ICD-10-CM | POA: Diagnosis not present

## 2023-12-17 DIAGNOSIS — Z79899 Other long term (current) drug therapy: Secondary | ICD-10-CM | POA: Diagnosis not present

## 2023-12-17 DIAGNOSIS — M79676 Pain in unspecified toe(s): Secondary | ICD-10-CM | POA: Diagnosis not present

## 2023-12-17 DIAGNOSIS — M109 Gout, unspecified: Secondary | ICD-10-CM | POA: Diagnosis not present

## 2023-12-17 DIAGNOSIS — M0579 Rheumatoid arthritis with rheumatoid factor of multiple sites without organ or systems involvement: Secondary | ICD-10-CM | POA: Diagnosis not present

## 2023-12-17 DIAGNOSIS — M7989 Other specified soft tissue disorders: Secondary | ICD-10-CM | POA: Diagnosis not present

## 2023-12-17 DIAGNOSIS — N1831 Chronic kidney disease, stage 3a: Secondary | ICD-10-CM | POA: Diagnosis not present

## 2023-12-17 DIAGNOSIS — M79643 Pain in unspecified hand: Secondary | ICD-10-CM | POA: Diagnosis not present

## 2023-12-17 DIAGNOSIS — M2550 Pain in unspecified joint: Secondary | ICD-10-CM | POA: Diagnosis not present

## 2024-01-29 NOTE — Progress Notes (Unsigned)
 "  Chief Complaint: Hospital follow-up Primary GI MD: Dr. Abran  HPI: 84 year old male history of hypertension, hyperlipidemia, CAD s/p CABG 2001, s/p stent placement 2019, A-fib, CAD s/p left carotid endarterectomy 2013,, CAD s/p stent placement 02/2022, prostate cancer S/P cryoablation, GERD, colon polyps, presents for hospital follow-up at [facility].  We do not have any records available at this time of this hospitalization but Dr. Amal Kazaure recommended following up with his primary GI.   Seen 02/2021 by Elida Nyle Sharps, NP.  At this time he was having central lower abdominal pain which radiates up to his epigastric area on most days ongoing since 2022 with normal bowel movements.  CBC and CMP at that time were unrevealing.  He had CTAP with contrast 03/2021 which showed prominence of stool in descending and sigmoid colon indicating constipation.  Nonobstructive renal calculus.  Stable hepatic and renal cysts.  Aortic atherosclerosis and atherosclerotic calcification of aortic branch vessels including plaque proximally in the celiac trunk and SMA.      Seen 04/27/23 for loose stools, fecal incontinence, shortness of breath with eating. Concern for overflow versus chronic mesenteric ischemia. Recommended miralax and Ibgard      Seen by myself 05/2023 for continued loose stools and fecal incontinence with KUB showing stool burden and recommendation to increase fiber and increase water      07/2023 seen by Dr. Abran with improvement in symptoms  Discussed the use of AI scribe software for clinical note transcription with the patient, who gave verbal consent to proceed.  History of Present Illness  [name] [name] is an 84 year old male with gastroesophageal reflux disease and prior diverticulitis who presents for follow-up of chest pain and gastrointestinal symptoms after recent hospitalization.  Hospitalized in November 2025 for chest pain that developed three days after starting NSAIDs  for gout and arthritis. CT scan showed diverticulitis, treated with antibiotics with subsequent improvement. Advised to continue a high fiber diet. Repeat CT showed resolution of diverticulitis but then showed large bowel fluid. Pancreatic lesion remains stable and asymptomatic.  Continues to experience intermittent chest pain, particularly after eating large meals. Eating five small meals per day is better tolerated than three large meals. Chest discomfort sometimes occurs even when drinking water. Currently taking pantoprazole  twice daily for acid reflux, with persistent symptoms. Also takes a large white pill three times daily, which provides some relief. Denies ongoing difficulty with eating, stating he is nibbling on something about all the time, but cannot eat much at once due to chest pain.  No current diarrhea, cough, or trouble eating. Bowel movements are well controlled and he is not taking a fiber supplement. No longer taking medication for gout.    PREVIOUS GI WORKUP   CT Chest/ab/pelvis 11/2023 IMPRESSION: 1. Increased large bowel fluid, which is nonspecific and can be seen with enterocolitis, an ileus, or other diarrheal illness.  2. Colonic diverticulosis without residual signs of diverticulitis.  3. Small pancreatic uncinate process cystic lesion, stable since 12/17/2019. This represent a side-branch IPMN or a pseudocyst with a history of prior pancreatitis with cystic neoplasm not excluded.  4. Coronary artery calcification (CAC) is present. Patient is status post CABG.   Past Medical History:  Diagnosis Date   Anemia 10/06/2011   Anginal pain    Arthritis    joints hurt; shoulders, arms, back (08/13/2013)   Atrial fibrillation (HCC)    B12 deficiency    Carotid stenosis 04/10/2011   a. s/p left carotid endarterectomy 04/14/2011.;  b.  Carotid US  (11/13):  L CEA ok; RICA 1-39%   Chronic bronchitis (HCC)    get it q yr   Chronic chest pain    Chronic lower back  pain    Colon polyp    adenomatous   Coronary artery disease    a. S/p CABG in 2001. b. S/p DES to protected LM and BMS to RCA 2006. c. 12/2009: s/p DES to Cx;  d. 09/2011 Cath: patent stents, patent grafts -->Med Rx.;  e. CP with abnl Nuc => LHC 10/04/12: oLM 30%, dLM stent into the CFX patent, LAD occluded, LIMA-LAD patent, RI 30%, mid AVCFX 30%, oOM1 50%, oRCA stent patent, mRCA 50-60%, Radial graft-Dx patent; EF 55%.=> Med Rx.  f. stent to LM & Circ,   Daily headache    for the last couple weeks (08/13/2013)   Diverticulosis    Esophageal stricture    GERD (gastroesophageal reflux disease)    Hemorrhoids    Hiatal hernia    Hyperlipidemia    Hypertension    Left bundle branch block    Memory deficit    Pancreatitis 12/2009   ERCP ok.   Prostate cancer [facility])    s/p cryoablation   Renal cyst    Seen on CT 08/2011 also with circumferential bladder wall thickening   Skin cancer    cut it off my right ear   Statin intolerance     Past Surgical History:  Procedure Laterality Date   CARDIAC CATHETERIZATION     CHOLECYSTECTOMY     COLONOSCOPY     CORONARY ANGIOPLASTY WITH STENT PLACEMENT     1 + 1   CORONARY ARTERY BYPASS GRAFT  2001   CORONARY STENT INTERVENTION N/A 09/11/2017   Procedure: CORONARY STENT INTERVENTION;  Surgeon: Court Dorn PARAS, MD;  Location: [facility] INVASIVE CV LAB;  Service: Cardiovascular;  Laterality: N/A;   CORONARY STENT INTERVENTION N/A 03/06/2022   Procedure: CORONARY STENT INTERVENTION;  Surgeon: Mady Bruckner, MD;  Location: [facility] INVASIVE CV LAB;  Service: Cardiovascular;  Laterality: N/A;   CORONARY STENT PLACEMENT  09/11/2017   Ost Cx to Prox Cx lesion is 95% stenosed.   ENDARTERECTOMY  04/14/2011   Procedure: ENDARTERECTOMY CAROTID;  Surgeon: Lynwood JONETTA Collum, MD;  Location: [facility] OR;  Service: Vascular;  Laterality: Left;  Would like to perform procedure first, at 0730   EYE SURGERY     cataract   INGUINAL HERNIA REPAIR Bilateral    LEFT HEART CATH  AND CORONARY ANGIOGRAPHY N/A 03/06/2022   Procedure: LEFT HEART CATH AND CORONARY ANGIOGRAPHY;  Surgeon: Mady Bruckner, MD;  Location: [facility] INVASIVE CV LAB;  Service: Cardiovascular;  Laterality: N/A;   LEFT HEART CATH AND CORS/GRAFTS ANGIOGRAPHY N/A 09/11/2017   Procedure: LEFT HEART CATH AND CORS/GRAFTS ANGIOGRAPHY;  Surgeon: Court Dorn PARAS, MD;  Location: [facility] INVASIVE CV LAB;  Service: Cardiovascular;  Laterality: N/A;   LEFT HEART CATHETERIZATION WITH CORONARY/GRAFT ANGIOGRAM N/A 10/05/2011   Procedure: LEFT HEART CATHETERIZATION WITH EL BILE;  Surgeon: Debby JONETTA Como, MD;  Location: [facility] CATH LAB;  Service: Cardiovascular;  Laterality: N/A;   LEFT HEART CATHETERIZATION WITH CORONARY/GRAFT ANGIOGRAM N/A 10/04/2012   Procedure: LEFT HEART CATHETERIZATION WITH EL BILE;  Surgeon: Toribio JONELLE Fuel, MD;  Location: [facility] CATH LAB;  Service: Cardiovascular;  Laterality: N/A;   POLYPECTOMY     PR VEIN BYPASS GRAFT,AORTO-FEM-POP     PROSTATE CRYOABLATION      Current Outpatient Medications  Medication Sig Dispense Refill   amLODipine  (  NORVASC ) 10 MG tablet Take 10 mg by mouth daily.     aspirin  EC 81 MG tablet Take 81 mg by mouth at bedtime.      dicyclomine  (BENTYL ) 10 MG capsule Take 10 mg by mouth every 6 (six) hours.     Evolocumab  (REPATHA  SURECLICK) 140 MG/ML SOAJ INJECT 140 MG INTO THE SKIN EVERY 14 (FOURTEEN) DAYS. 6 mL 3   ferrous sulfate 325 (65 FE) MG tablet Take 325 mg by mouth daily.     gabapentin  (NEURONTIN ) 300 MG capsule Take 300 mg by mouth daily.     isosorbide  mononitrate (IMDUR ) 30 MG 24 hr tablet Take 30 mg by mouth daily.     isosorbide  mononitrate (IMDUR ) 60 MG 24 hr tablet Take 1 tablet (60 mg total) by mouth daily. (Patient taking differently: Take 30 mg by mouth daily.) 30 tablet 0   losartan  (COZAAR ) 25 MG tablet Take 25 mg by mouth daily. (Patient taking differently: Take 50 mg by mouth daily.)     metoprolol  tartrate (LOPRESSOR ) 50 MG  tablet Take 0.5 tablets (25 mg total) by mouth 2 (two) times daily.     nitroGLYCERIN  (NITROSTAT ) 0.4 MG SL tablet Place 1 tablet (0.4 mg total) under the tongue every 5 (five) minutes as needed. For chest pain 25 tablet 11   ondansetron  (ZOFRAN -ODT) 4 MG disintegrating tablet Take 4 mg by mouth every 6 (six) hours as needed.     pantoprazole  (PROTONIX ) 40 MG tablet Take 1 tablet (40 mg total) by mouth 2 (two) times daily before a meal.     PARoxetine  (PAXIL ) 30 MG tablet SMARTSIG:1 Tablet(s) By Mouth Every Evening     polyethylene glycol (MIRALAX / GLYCOLAX) 17 g packet Take 17 g by mouth at bedtime.     simethicone  (MYLICON) 125 MG chewable tablet Chew 125 mg by mouth every 6 (six) hours as needed for flatulence.     sucralfate  (CARAFATE ) 1 g tablet Take 1 g by mouth 3 (three) times daily.     Vonoprazan Fumarate  (VOQUEZNA ) 10 MG TABS Take 1 tablet by mouth daily. 30 tablet 3   Wheat Dextrin (BENEFIBER) POWD Take 1 Scoop by mouth 2 (two) times daily.     allopurinol (ZYLOPRIM) 100 MG tablet Take 50 mg by mouth daily.     No current facility-administered medications for this visit.    Allergies as of 01/30/2024 - Review Complete 01/30/2024  Allergen Reaction Noted   Shellfish allergy Anaphylaxis 03/04/2011   Zolpidem tartrate Other (See Comments)    Zolpidem tartrate Other (See Comments) 11/14/2017   Brilinta  [ticagrelor ] Other (See Comments) 11/14/2017   Amoxicillin-pot clavulanate Other (See Comments)    Atorvastatin  Other (See Comments) 01/21/2020   Codeine Nausea And Vomiting    Erythromycin Diarrhea, Nausea And Vomiting, and Other (See Comments) 10/18/2011   Hydrochlorothiazide Other (See Comments) 03/05/2022   Hydrocodone  Nausea And Vomiting 97/90/7986   Morphine  Nausea And Vomiting    Nitrofuran derivatives Other (See Comments) 03/04/2011   Oxycodone hcl Nausea And Vomiting    Tramadol Nausea And Vomiting 02/01/2010   Zocor [simvastatin] Other (See Comments) 01/21/2020     Family History  Problem Relation Age of Onset   Heart disease Mother        Heart Disease before age 3   Hypertension Mother    Heart attack Mother    Lung cancer Father    Hypertension Sister    Heart disease Sister        Heart Disease before  age 65   Cancer Sister    Heart attack Sister    Hypertension Brother    Heart disease Brother        Heart Disease before age 81   Heart attack Brother    Colon cancer Neg Hx    Esophageal cancer Neg Hx    Pancreatic cancer Neg Hx    Stomach cancer Neg Hx     Social History   Socioeconomic History   Marital status: Widowed    Spouse name: Not on file   Number of children: 2   Years of education: Not on file   Highest education level: Not on file  Occupational History   Occupation: Retired    Comment: raised programme researcher, broadcasting/film/video  Tobacco Use   Smoking status: Former    Types: Cigarettes   Smokeless tobacco: Current    Types: Chew   Tobacco comments:    smoked a few cigarettes; no more than 1 month  Vaping Use   Vaping status: Never Used  Substance and Sexual Activity   Alcohol use: No    Alcohol/week: 0.0 standard drinks of alcohol    Comment: no alcohol since I was a teenager   Drug use: No   Sexual activity: Not Currently  Other Topics Concern   Not on file  Social History Narrative   Patient is illiterate. He cannot read or write. He left school at about the seventh grade.   As of 10/2015 he reports that his wife is chronically ill at home with heart disease and COPD and is under hospice care. The bulk of the care is given by the patient and daughter.   Social Drivers of Health   Tobacco Use: High Risk (01/30/2024)   Patient History    Smoking Tobacco Use: Former    Smokeless Tobacco Use: Current    Passive Exposure: Not on Actuary Strain: Not on file  Food Insecurity: Not on file  Transportation Needs: Not on file  Physical Activity: Not on file  Stress: Not on file  Social Connections: Not on  file  Intimate Partner Violence: Not on file  Depression (PHQ2-9): Not on file  Alcohol Screen: Not on file  Housing: Not on file  Utilities: Not on file  Health Literacy: Not on file    Review of Systems:    Constitutional: No weight loss, fever, chills, weakness or fatigue HEENT: Eyes: No change in vision               Ears, Nose, Throat:  No change in hearing or congestion Skin: No rash or itching Cardiovascular: No chest pain, chest pressure or palpitations   Respiratory: No SOB or cough Gastrointestinal: See HPI and otherwise negative Genitourinary: No dysuria or change in urinary frequency Neurological: No headache, dizziness or syncope Musculoskeletal: No new muscle or joint pain Hematologic: No bleeding or bruising Psychiatric: No history of depression or anxiety    Physical Exam:  Vital signs: BP (!) 142/80 (BP Location: Left Arm, Patient Position: Sitting, Cuff Size: Normal)   Pulse 61   Ht 5' 4 (1.626 m)   Wt 141 lb (64 kg)   BMI 24.20 kg/m   Constitutional: NAD, alert and cooperative Head:  Normocephalic and atraumatic. Eyes:   PEERL, EOMI. No icterus. Conjunctiva pink. Respiratory: Respirations even and unlabored. Lungs clear to auscultation bilaterally.   No wheezes, crackles, or rhonchi.  Cardiovascular:  Regular rate and rhythm. No peripheral edema, cyanosis or pallor.  Gastrointestinal:  Soft,  nondistended, nontender. No rebound or guarding. Normal bowel sounds. No appreciable masses or hepatomegaly. Rectal:  Declines Msk:  Symmetrical without gross deformities. Without edema, no deformity or joint abnormality.  Neurologic:  Alert and  oriented x4;  grossly normal neurologically.  Skin:   Dry and intact without significant lesions or rashes. Psychiatric: Oriented to person, place and time. Demonstrates good judgement and reason without abnormal affect or behaviors.  Physical Exam    RELEVANT LABS AND IMAGING: CBC    Component Value Date/Time    WBC 5.0 06/25/2023 0454   RBC 4.12 (L) 06/25/2023 0454   HGB 13.1 06/25/2023 0454   HCT 38.2 (L) 06/25/2023 0454   PLT 168 06/25/2023 0454   MCV 92.7 06/25/2023 0454   MCH 31.8 06/25/2023 0454   MCHC 34.3 06/25/2023 0454   RDW 13.0 06/25/2023 0454   LYMPHSABS 1.6 06/24/2023 2218   MONOABS 0.6 06/24/2023 2218   EOSABS 0.2 06/24/2023 2218   BASOSABS 0.1 06/24/2023 2218    CMP     Component Value Date/Time   NA 138 06/25/2023 0454   K 4.3 06/25/2023 0454   CL 107 06/25/2023 0454   CO2 25 06/25/2023 0454   GLUCOSE 101 (H) 06/25/2023 0454   BUN 22 06/25/2023 0454   CREATININE 1.60 (H) 06/25/2023 0454   CREATININE 1.26 04/07/2011 1549   CALCIUM  8.2 (L) 06/25/2023 0454   PROT 6.4 (L) 03/05/2022 0107   PROT 6.6 01/31/2018 1154   ALBUMIN 3.6 03/05/2022 0107   ALBUMIN 4.3 01/31/2018 1154   AST 20 03/05/2022 0107   ALT 16 03/05/2022 0107   ALKPHOS 45 03/05/2022 0107   BILITOT 1.0 03/05/2022 0107   BILITOT 1.2 01/31/2018 1154   GFRNONAA 42 (L) 06/25/2023 0454   GFRAA 56 (L) 09/28/2017 1912     Assessment/Plan:   Lower abdominal pain Fecal incontinence/urgency Loose stools 2023 CT scan showing constipation and possible chronic mesenteric ischemia.  On MiraLAX and fiber. Recent CTAP 11/2023 showing Increased large bowel fluid, which is nonspecific and can be seen with enterocolitis, an ileus, or other diarrheal illness. Appears patient had infectious diarrhea which has since resolved and he is having normal bowel movements now. - Continue Ibgard -- continue fiber supplement - Follow-up 6-8 weeks   GERD Worsening GERD after NSAIDs for gout which prompted his ED visit in [place] [facility]. Some improvement on carafate  but still reports burning with eating despite PPI BID -- educated patient on lifestyle modifications -- stop pantoprazole  40mg  BID, start voquezna  10mg  daily (samples provided) -- can continue carafate  but advised it may exacerbate his known constipation --  obtain [place] hospital notes and labs -- follow up 6 weeks  History of diverticulitis CTAP w contrast 11/28/2023 with diverticulitis. Patient was treated with abx with improvement. CTAP w contrast 12/01/2023 with resolution -- high fiber diet -- educated patient on diverticulosis/diverticulitis and provided patient education  -- patient is very high risk for colonoscopy  Pancreatic lesion Small pancreatic uncinate process cystic lesion 7mm, stable since 12/17/2019. This represent a side-branch IPMN or a pseudocyst. -- stability of lesion is reassuring, no weight loss, no other symptoms. Continue to monitor  History of 2 tubular adenomatous polyps removed from the ascending colon per colonoscopy 02/2014.   Patient was scheduled for a colonoscopy in 2021 but he canceled this procedure.   CAD s/p CABG s/p stent on ASA. Recent stent placement 02/2022 (no longer on Plavix ) LBBB   Carotid artery disease s/p left CEA 2013 on ASA  Patient is at high risk for any invasive evaluation secondary to age and multiple comorbidities.  He will need cardiac clearance prior to pursuing any endoscopic evaluation.  Patient would also like to avoid procedures if possible   Nestor Mollie DEVONNA [facility] 01/30/2024, 10:50 AM  Cc: Montey Lot, PA-C "

## 2024-01-30 ENCOUNTER — Ambulatory Visit: Admitting: Gastroenterology

## 2024-01-30 ENCOUNTER — Encounter: Payer: Self-pay | Admitting: Gastroenterology

## 2024-01-30 VITALS — BP 142/80 | HR 61 | Ht 64.0 in | Wt 141.0 lb

## 2024-01-30 DIAGNOSIS — K219 Gastro-esophageal reflux disease without esophagitis: Secondary | ICD-10-CM

## 2024-01-30 DIAGNOSIS — Z860101 Personal history of adenomatous and serrated colon polyps: Secondary | ICD-10-CM | POA: Diagnosis not present

## 2024-01-30 DIAGNOSIS — K869 Disease of pancreas, unspecified: Secondary | ICD-10-CM

## 2024-01-30 DIAGNOSIS — R159 Full incontinence of feces: Secondary | ICD-10-CM | POA: Diagnosis not present

## 2024-01-30 DIAGNOSIS — Z8719 Personal history of other diseases of the digestive system: Secondary | ICD-10-CM | POA: Diagnosis not present

## 2024-01-30 DIAGNOSIS — I25810 Atherosclerosis of coronary artery bypass graft(s) without angina pectoris: Secondary | ICD-10-CM

## 2024-01-30 DIAGNOSIS — R933 Abnormal findings on diagnostic imaging of other parts of digestive tract: Secondary | ICD-10-CM | POA: Diagnosis not present

## 2024-01-30 DIAGNOSIS — R1030 Lower abdominal pain, unspecified: Secondary | ICD-10-CM | POA: Diagnosis not present

## 2024-01-30 DIAGNOSIS — Z9889 Other specified postprocedural states: Secondary | ICD-10-CM

## 2024-01-30 DIAGNOSIS — K5900 Constipation, unspecified: Secondary | ICD-10-CM

## 2024-01-30 DIAGNOSIS — R152 Fecal urgency: Secondary | ICD-10-CM

## 2024-01-30 DIAGNOSIS — R197 Diarrhea, unspecified: Secondary | ICD-10-CM

## 2024-01-30 MED ORDER — VOQUEZNA 10 MG PO TABS: 1.0000 | ORAL_TABLET | Freq: Every day | ORAL | 3 refills | Status: AC

## 2024-01-30 NOTE — Patient Instructions (Signed)
 We have sent the following medications to your pharmacy for you to pick up at your convenience:  Voquezna   _______________________________________________________  If your blood pressure at your visit was 140/90 or greater, please contact your primary care physician to follow up on this.  _______________________________________________________  If you are age 84 or older, your body mass index should be between 23-30. Your Body mass index is 24.2 kg/m. If this is out of the aforementioned range listed, please consider follow up with your Primary Care Provider.  If you are age 18 or younger, your body mass index should be between 19-25. Your Body mass index is 24.2 kg/m. If this is out of the aformentioned range listed, please consider follow up with your Primary Care Provider.   ________________________________________________________  The [facility] providers would like to encourage you to use MYCHART to communicate with providers for non-urgent requests or questions.  Due to long hold times on the telephone, sending your provider a message by [facility] may be a faster and more efficient way to get a response.  Please allow 48 business hours for a response.  Please remember that this is for non-urgent requests.  _______________________________________________________  [facility] is using a team-based approach to care.  Your team is made up of your doctor and two to three APPS. Our APPS (Nurse Practitioners and Physician Assistants) work with your physician to ensure care continuity for you. They are fully qualified to address your health concerns and develop a treatment plan. They communicate directly with your gastroenterologist to care for you. Seeing the Advanced Practice Practitioners on your physician's team can help you by facilitating care more promptly, often allowing for earlier appointments, access to diagnostic testing, procedures, and other specialty referrals.

## 2024-01-30 NOTE — Progress Notes (Signed)
 Noted. BM, Your note stated that he was sent here for pancreatitis and peptic ulcer disease.  I do not see evidence for either.  MRI missing something? Otherwise, agree with assessment and plans.  At 70, he needs no further pancreatic imaging for this stable lesion. Thanks, Dr. Abran

## 2024-02-01 ENCOUNTER — Telehealth: Payer: Self-pay | Admitting: Gastroenterology

## 2024-02-01 NOTE — Telephone Encounter (Signed)
 Reviewed hospital notes from [facility] stay which patient was admitted 11/28/2023 and discharged on 09/12/2023.  Patient reported to emergency room with burning epigastric pain and nausea associated with food.  Upon arrival lipase 804.  CT imaging showed normal pancreas but acute uncomplicated sigmoid diverticulitis was noted and patient received IV ceftriaxone with Flagyl  and improvement in pain.  Lipase trended down and patient was discharged on oral antibiotics

## 2024-02-05 ENCOUNTER — Other Ambulatory Visit (HOSPITAL_COMMUNITY): Payer: Self-pay

## 2024-02-05 ENCOUNTER — Telehealth: Payer: Self-pay

## 2024-02-05 NOTE — Telephone Encounter (Signed)
 Pharmacy Patient Advocate Encounter   Received notification from CoverMyMeds that prior authorization for Voquezna  10MG  tablets is required/requested.   Insurance verification completed.   The patient is insured through [facility].   Prior Authorization for Voquezna  10MG  tablets has been APPROVED from 02-05-2024 to 03-07-2024. Ran test claim, Copay is $348.55**. This test claim was processed through [facility]- copay amounts may vary at other pharmacies due to pharmacy/plan contracts, or as the patient moves through the different stages of their insurance plan.   PA #/Case ID/Reference #: [id number]   **Patient has deductible to meet

## 2024-02-15 ENCOUNTER — Other Ambulatory Visit: Payer: Self-pay | Admitting: Cardiology

## 2024-03-12 ENCOUNTER — Ambulatory Visit: Admitting: Gastroenterology
